# Patient Record
Sex: Female | Born: 1975 | Race: White | Hispanic: No | Marital: Married | Smoking: Never smoker
Health system: Southern US, Community
[De-identification: ages and names within clinical notes are randomized; demographics above are authoritative.]

## PROBLEM LIST (undated history)

## (undated) ENCOUNTER — Inpatient Hospital Stay (HOSPITAL_COMMUNITY): Payer: Self-pay

## (undated) DIAGNOSIS — F32A Depression, unspecified: Secondary | ICD-10-CM

## (undated) DIAGNOSIS — Z8619 Personal history of other infectious and parasitic diseases: Secondary | ICD-10-CM

## (undated) DIAGNOSIS — Z789 Other specified health status: Secondary | ICD-10-CM

## (undated) DIAGNOSIS — F329 Major depressive disorder, single episode, unspecified: Secondary | ICD-10-CM

## (undated) DIAGNOSIS — I10 Essential (primary) hypertension: Secondary | ICD-10-CM

## (undated) DIAGNOSIS — T7840XA Allergy, unspecified, initial encounter: Secondary | ICD-10-CM

## (undated) DIAGNOSIS — IMO0002 Reserved for concepts with insufficient information to code with codable children: Secondary | ICD-10-CM

## (undated) DIAGNOSIS — C50919 Malignant neoplasm of unspecified site of unspecified female breast: Secondary | ICD-10-CM

## (undated) DIAGNOSIS — C801 Malignant (primary) neoplasm, unspecified: Secondary | ICD-10-CM

## (undated) HISTORY — PX: TUBAL LIGATION: SHX77

## (undated) HISTORY — DX: Allergy, unspecified, initial encounter: T78.40XA

## (undated) HISTORY — DX: Personal history of other infectious and parasitic diseases: Z86.19

## (undated) HISTORY — PX: FRACTURE SURGERY: SHX138

## (undated) HISTORY — PX: WISDOM TOOTH EXTRACTION: SHX21

## (undated) HISTORY — PX: COSMETIC SURGERY: SHX468

## (undated) HISTORY — PX: HERNIA REPAIR: SHX51

## (undated) HISTORY — PX: FOOT SURGERY: SHX648

## (undated) HISTORY — DX: Reserved for concepts with insufficient information to code with codable children: IMO0002

## (undated) HISTORY — DX: Malignant neoplasm of unspecified site of unspecified female breast: C50.919

---

## 2000-09-13 DIAGNOSIS — R87619 Unspecified abnormal cytological findings in specimens from cervix uteri: Secondary | ICD-10-CM

## 2000-09-13 DIAGNOSIS — IMO0002 Reserved for concepts with insufficient information to code with codable children: Secondary | ICD-10-CM

## 2000-09-13 HISTORY — DX: Unspecified abnormal cytological findings in specimens from cervix uteri: R87.619

## 2000-09-13 HISTORY — DX: Reserved for concepts with insufficient information to code with codable children: IMO0002

## 2011-04-12 LAB — HEPATITIS B SURFACE ANTIGEN: Hepatitis B Surface Ag: NEGATIVE

## 2011-04-12 LAB — RPR: RPR: NONREACTIVE

## 2011-04-12 LAB — RUBELLA ANTIBODY, IGM: Rubella: IMMUNE

## 2011-04-12 LAB — ANTIBODY SCREEN: Antibody Screen: NEGATIVE

## 2011-09-06 ENCOUNTER — Inpatient Hospital Stay (HOSPITAL_COMMUNITY): Admission: AD | Admit: 2011-09-06 | Payer: Self-pay | Source: Ambulatory Visit | Admitting: Obstetrics and Gynecology

## 2011-09-14 DIAGNOSIS — I10 Essential (primary) hypertension: Secondary | ICD-10-CM

## 2011-09-14 HISTORY — DX: Essential (primary) hypertension: I10

## 2011-10-06 ENCOUNTER — Encounter (HOSPITAL_COMMUNITY): Payer: Self-pay

## 2011-10-06 ENCOUNTER — Inpatient Hospital Stay (HOSPITAL_COMMUNITY)
Admission: AD | Admit: 2011-10-06 | Discharge: 2011-10-06 | Disposition: A | Discharge status: Home / Self Care | Payer: PRIVATE HEALTH INSURANCE | Source: Ambulatory Visit | Attending: Obstetrics and Gynecology | Admitting: Obstetrics and Gynecology

## 2011-10-06 DIAGNOSIS — O368190 Decreased fetal movements, unspecified trimester, not applicable or unspecified: Secondary | ICD-10-CM | POA: Insufficient documentation

## 2011-10-06 DIAGNOSIS — O4700 False labor before 37 completed weeks of gestation, unspecified trimester: Secondary | ICD-10-CM | POA: Insufficient documentation

## 2011-10-06 DIAGNOSIS — O479 False labor, unspecified: Secondary | ICD-10-CM

## 2011-10-06 HISTORY — DX: Other specified health status: Z78.9

## 2011-10-06 LAB — URINALYSIS, ROUTINE W REFLEX MICROSCOPIC
Glucose, UA: NEGATIVE mg/dL
Hgb urine dipstick: NEGATIVE
Ketones, ur: NEGATIVE mg/dL
Protein, ur: NEGATIVE mg/dL

## 2011-10-06 NOTE — Progress Notes (Signed)
Pt states sent from office for ptl eval, had decreased fm, found to be ctxing. Denies bleeding or lof. +FM. Denies pain at present.

## 2011-10-06 NOTE — Progress Notes (Signed)
Patient states she went into the office today for decreased fetal movement. When placed on a monitor she was having contractions and sent to MAU for further monitoring.

## 2011-10-06 NOTE — ED Provider Notes (Signed)
History   Cheyna Retana is a 36y.o. MWF who presents at 35.3 weeks from office for extended ext fetal monitoring.  Pt presented to office secondary to decreased fetal movement recent, and also reporting pain at top of her stomach last night.  Seen by FNP at office and undeterminable fetal baseline on NST (i.e. Unsure if reactive vs variables); pt also had "9" ctxs while on NST at office.  Cx was L/closed at office exam by FNP.  Accompanied by her husband to MAU.  Reports adequate water intake recently.  Did walk and have increased activity last few days.  Denies LOF, VB, abnl d/c, UTI or PIH s/s.  No resp or GI c/o's.  Still working as Pensions consultant.  Followed by CNM service at [facility].   Pregnancy r/f:  1.  H/o eating disorder in her 35's  2.  H/o long cycles  3.  H/o PAC's/irreg heartbeat 4.  H/o freq UTI's in the past  4.  H/o EAB in 2006  Chief Complaint  Patient presents with  . Contractions   HPI  OB History    Grav Para Term Preterm Abortions TAB SAB Ect Mult Living   1               Past Medical History  Diagnosis Date  . No pertinent past medical history     Past Surgical History  Procedure Date  . Foot surgery     Family History  Problem Relation Age of Onset  . Anesthesia problems Neg Hx     History  Substance Use Topics  . Smoking status: Never Smoker   . Smokeless tobacco: Never Used  . Alcohol Use: No    Allergies: No Known Allergies  Prescriptions prior to admission  Medication Sig Dispense Refill  . Docosahexaenoic Acid (DHA OMEGA 3 PO) Take 1 tablet by mouth.      . Prenatal Vit-Fe Fumarate-FA (PRENATAL MULTIVITAMIN) TABS Take 1 tablet by mouth daily.        ROS--see history above Physical Exam   Blood pressure 132/77, pulse 110, temperature 97.6 F (36.4 C), temperature source Oral, resp. rate 20, SpO2 99.00%. EFM:  135, reactive, moderate variability, no decels TOCO:  irreg UC's q 2-7, spaced more as time passed on monitor--but did not complete  resolve .Marland Kitchen Results for orders placed during the hospital encounter of 10/06/11 (from the past 24 hour(s))  URINALYSIS, ROUTINE W REFLEX MICROSCOPIC     Status: Abnormal   Collection Time   10/06/11  5:35 PM      Component Value Range   Color, Urine YELLOW  YELLOW    APPearance CLEAR  CLEAR    Specific Gravity, Urine <1.005 (*) 1.005 - 1.030    pH 6.5  5.0 - 8.0    Glucose, UA NEGATIVE  NEGATIVE (mg/dL)   Hgb urine dipstick NEGATIVE  NEGATIVE    Bilirubin Urine NEGATIVE  NEGATIVE    Ketones, ur NEGATIVE  NEGATIVE (mg/dL)   Protein, ur NEGATIVE  NEGATIVE (mg/dL)   Urobilinogen, UA 0.2  0.0 - 1.0 (mg/dL)   Nitrite NEGATIVE  NEGATIVE    Leukocytes, UA NEGATIVE  NEGATIVE    Physical Exam  Constitutional: She is oriented to person, place, and time. She appears well-developed and well-nourished. No distress.       Anxious  Cardiovascular: Normal rate and regular rhythm.   Respiratory: Effort normal.  GI: Soft.       gravid  Neurological: She is alert and oriented to  person, place, and time.  Skin: Skin is warm and dry.    MAU Course  Procedures 1.  Extended ext monitoring  Assessment and Plan  1.  IUP at 35.3 2.  Preterm contractions but cx closed 3.  Reactive FHT 4.  H/o recent decreased FM; change from previous amt pt used to feeling  1.  D/c home w/ PTL precautions and FKC 2.  Has appt 1 week from tomorrow at [facility]; offered u/s before then for h/o decreased FM to eval afi, growth, bpp, and pt declined for this week, but ok to add to next week; also ok to move date up from Thurs--left voicemail at office to help schedule u/s w/ next ROB 3.  Declined Procardia and/or other meds for therapeutic rest, but may try Tylenol PM at night 4.  F/u prn 5.  Rec'd decrease exercise and pelvic rest until after 37 weeks  Joash Tony H 10/06/2011, 8:36 PM

## 2011-10-13 LAB — STREP B DNA PROBE: GBS: NEGATIVE

## 2011-11-12 ENCOUNTER — Ambulatory Visit (INDEPENDENT_AMBULATORY_CARE_PROVIDER_SITE_OTHER): Payer: PRIVATE HEALTH INSURANCE | Admitting: Obstetrics and Gynecology

## 2011-11-12 ENCOUNTER — Encounter (HOSPITAL_COMMUNITY): Payer: Self-pay | Admitting: *Deleted

## 2011-11-12 ENCOUNTER — Inpatient Hospital Stay (HOSPITAL_COMMUNITY)
Admission: AD | Admit: 2011-11-12 | Discharge: 2011-11-17 | DRG: 765 | Disposition: A | Discharge status: Home / Self Care | Payer: PRIVATE HEALTH INSURANCE | Source: Ambulatory Visit | Attending: Obstetrics and Gynecology | Admitting: Obstetrics and Gynecology

## 2011-11-12 ENCOUNTER — Other Ambulatory Visit: Payer: PRIVATE HEALTH INSURANCE

## 2011-11-12 ENCOUNTER — Encounter: Payer: PRIVATE HEALTH INSURANCE | Admitting: Obstetrics and Gynecology

## 2011-11-12 ENCOUNTER — Inpatient Hospital Stay (HOSPITAL_COMMUNITY): Payer: PRIVATE HEALTH INSURANCE

## 2011-11-12 DIAGNOSIS — Z331 Pregnant state, incidental: Secondary | ICD-10-CM

## 2011-11-12 DIAGNOSIS — N6320 Unspecified lump in the left breast, unspecified quadrant: Secondary | ICD-10-CM

## 2011-11-12 DIAGNOSIS — Z3400 Encounter for supervision of normal first pregnancy, unspecified trimester: Secondary | ICD-10-CM

## 2011-11-12 DIAGNOSIS — O3660X0 Maternal care for excessive fetal growth, unspecified trimester, not applicable or unspecified: Secondary | ICD-10-CM | POA: Diagnosis present

## 2011-11-12 DIAGNOSIS — D649 Anemia, unspecified: Secondary | ICD-10-CM | POA: Diagnosis not present

## 2011-11-12 DIAGNOSIS — K649 Unspecified hemorrhoids: Secondary | ICD-10-CM | POA: Diagnosis present

## 2011-11-12 DIAGNOSIS — O4290 Premature rupture of membranes, unspecified as to length of time between rupture and onset of labor, unspecified weeks of gestation: Secondary | ICD-10-CM | POA: Diagnosis present

## 2011-11-12 DIAGNOSIS — O09529 Supervision of elderly multigravida, unspecified trimester: Secondary | ICD-10-CM | POA: Diagnosis present

## 2011-11-12 DIAGNOSIS — O368190 Decreased fetal movements, unspecified trimester, not applicable or unspecified: Secondary | ICD-10-CM

## 2011-11-12 DIAGNOSIS — O324XX0 Maternal care for high head at term, not applicable or unspecified: Secondary | ICD-10-CM | POA: Diagnosis present

## 2011-11-12 DIAGNOSIS — O480 Post-term pregnancy: Secondary | ICD-10-CM | POA: Diagnosis present

## 2011-11-12 DIAGNOSIS — Z8659 Personal history of other mental and behavioral disorders: Secondary | ICD-10-CM

## 2011-11-12 DIAGNOSIS — O9903 Anemia complicating the puerperium: Secondary | ICD-10-CM | POA: Diagnosis not present

## 2011-11-12 DIAGNOSIS — O878 Other venous complications in the puerperium: Secondary | ICD-10-CM | POA: Diagnosis present

## 2011-11-12 DIAGNOSIS — N63 Unspecified lump in unspecified breast: Secondary | ICD-10-CM

## 2011-11-12 DIAGNOSIS — Z8679 Personal history of other diseases of the circulatory system: Secondary | ICD-10-CM

## 2011-11-12 DIAGNOSIS — Z8742 Personal history of other diseases of the female genital tract: Secondary | ICD-10-CM

## 2011-11-12 HISTORY — DX: Major depressive disorder, single episode, unspecified: F32.9

## 2011-11-12 HISTORY — DX: Essential (primary) hypertension: I10

## 2011-11-12 HISTORY — DX: Depression, unspecified: F32.A

## 2011-11-12 LAB — CBC
Hemoglobin: 12.2 g/dL (ref 12.0–15.0)
MCHC: 34.1 g/dL (ref 30.0–36.0)
Platelets: 203 10*3/uL (ref 150–400)
RBC: 3.75 MIL/uL — ABNORMAL LOW (ref 3.87–5.11)

## 2011-11-12 LAB — URINALYSIS, ROUTINE W REFLEX MICROSCOPIC
Bilirubin Urine: NEGATIVE
Nitrite: NEGATIVE
Specific Gravity, Urine: <1.005 — ABNORMAL LOW (ref 1.005–1.030)
pH: 6.5 (ref 5.0–8.0)

## 2011-11-12 LAB — AMNISURE RUPTURE OF MEMBRANE (ROM) NOT AT ARMC: Amnisure ROM: NEGATIVE

## 2011-11-12 LAB — WET PREP, GENITAL
Clue Cells Wet Prep HPF POC: NONE SEEN
Trich, Wet Prep: NONE SEEN
Yeast Wet Prep HPF POC: NONE SEEN

## 2011-11-12 LAB — GC/CHLAMYDIA PROBE AMP, GENITAL: Gonorrhea: NEGATIVE

## 2011-11-12 IMAGING — US US FETAL BPP W/O NONSTRESS
1 series · 13 of 21 positions shown · non-contrast
Comparison: none

[Series 1: us fetal bpp w/o nonstress · non-contrast · 21 acquisitions, 13 frames shown]
[im 1/21]
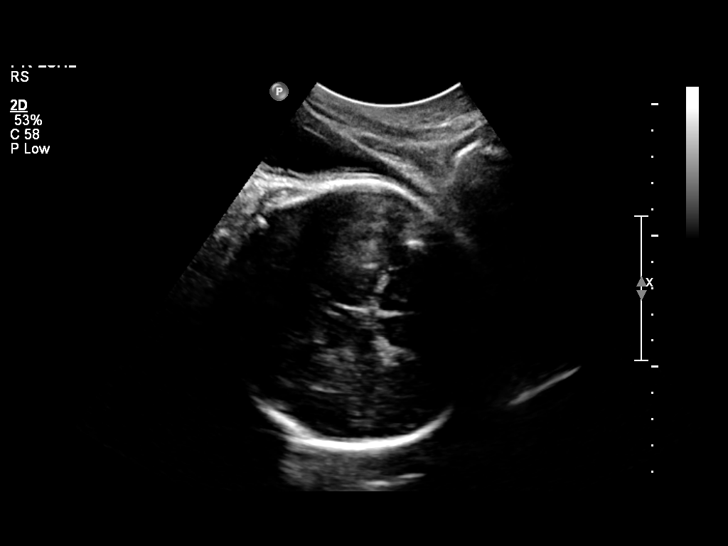
[im 3/21]
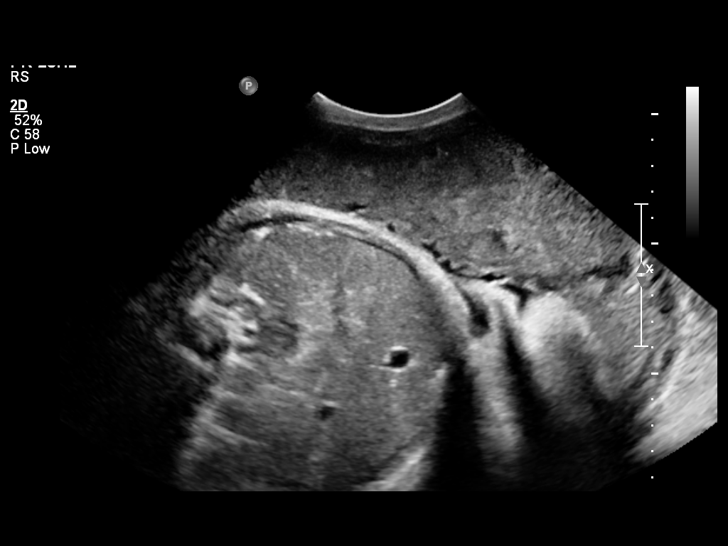
[im 5/21]
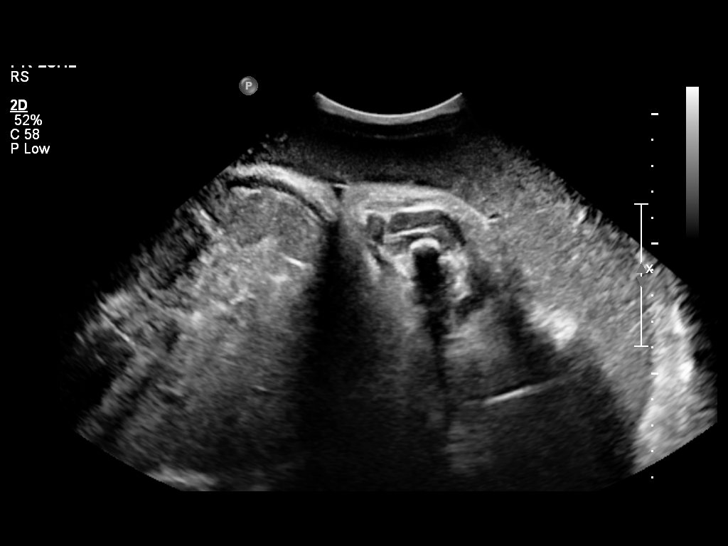
[im 6/21]
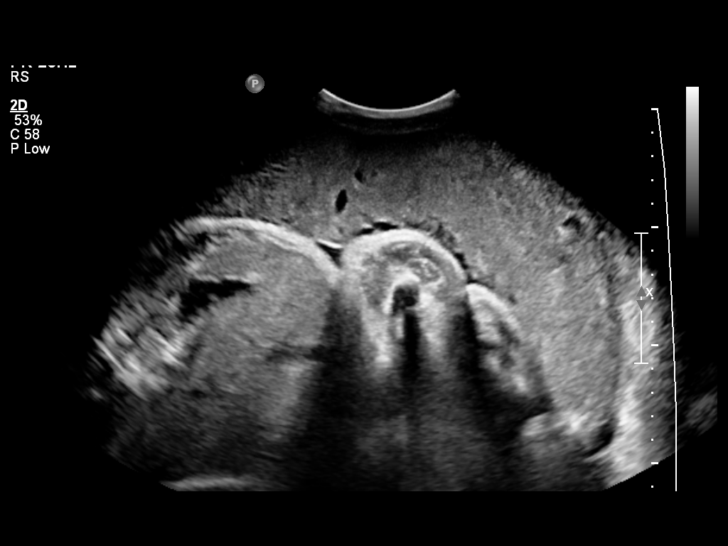
[im 8/21]
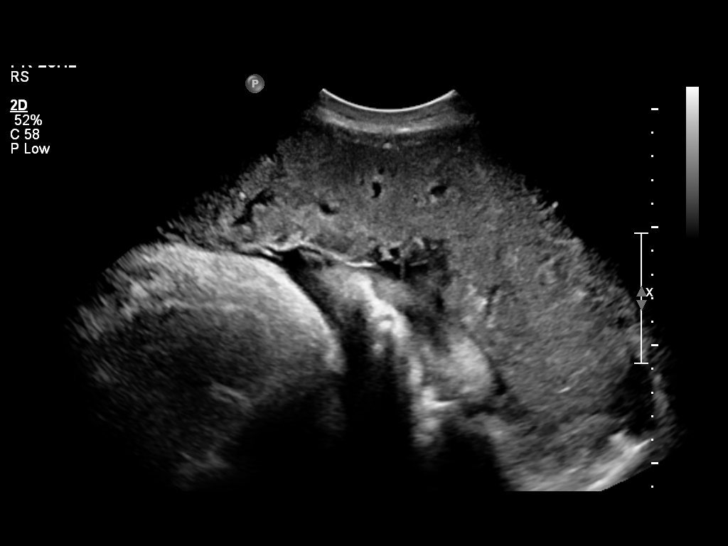
[im 9/21]
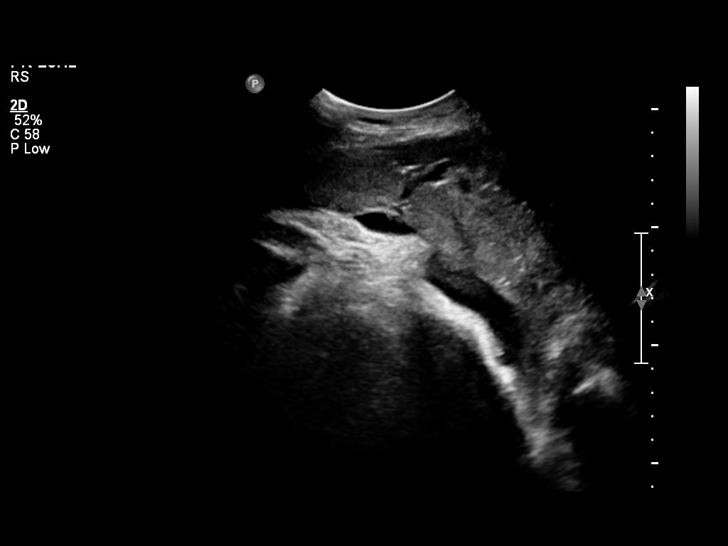
[im 11/21]
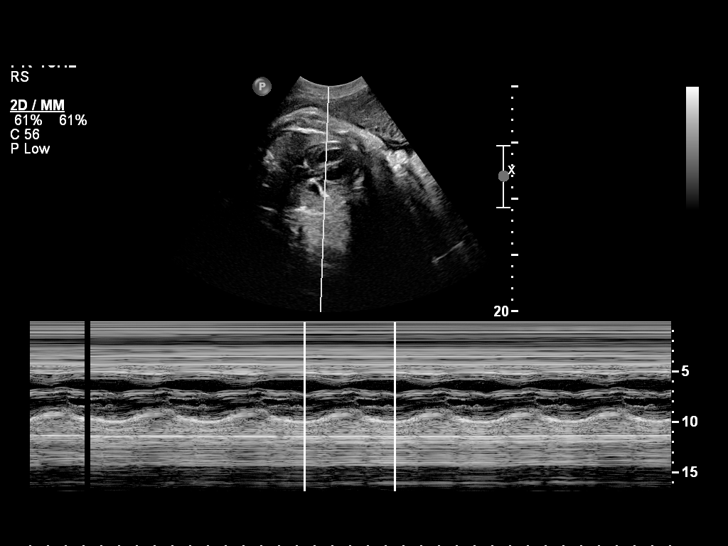
[im 13/21]
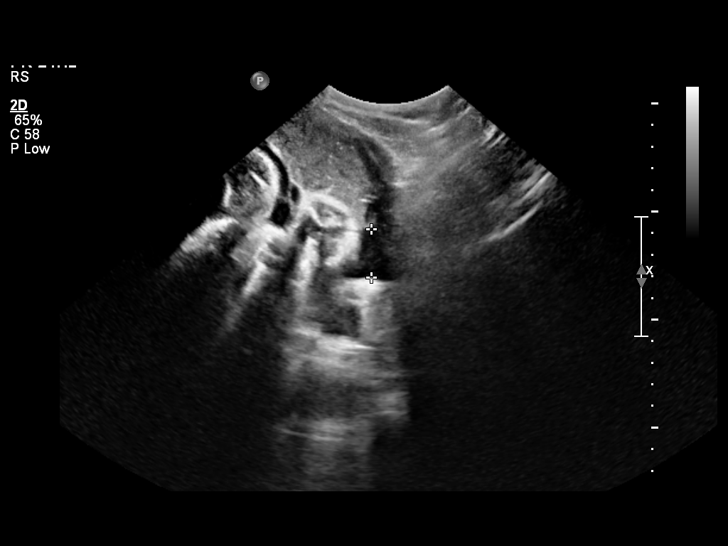
[im 14/21]
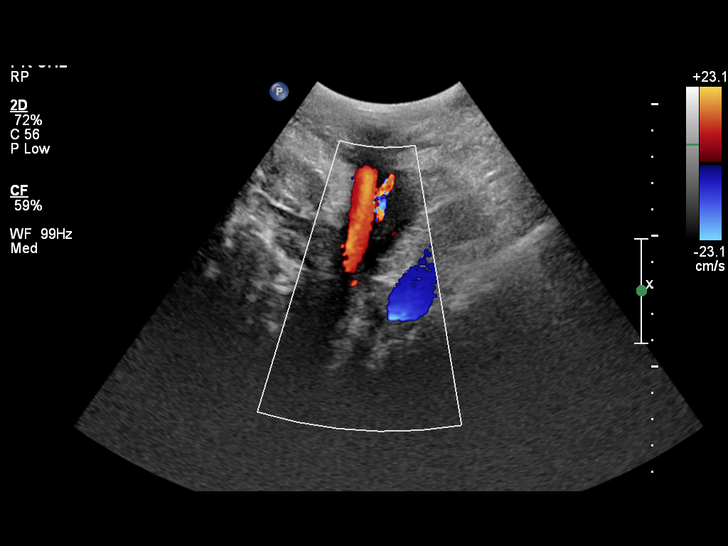
[im 16/21]
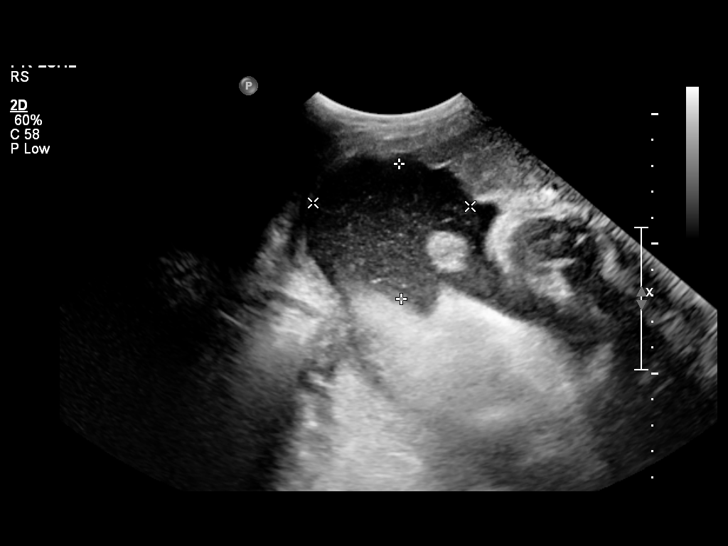
[im 17/21]
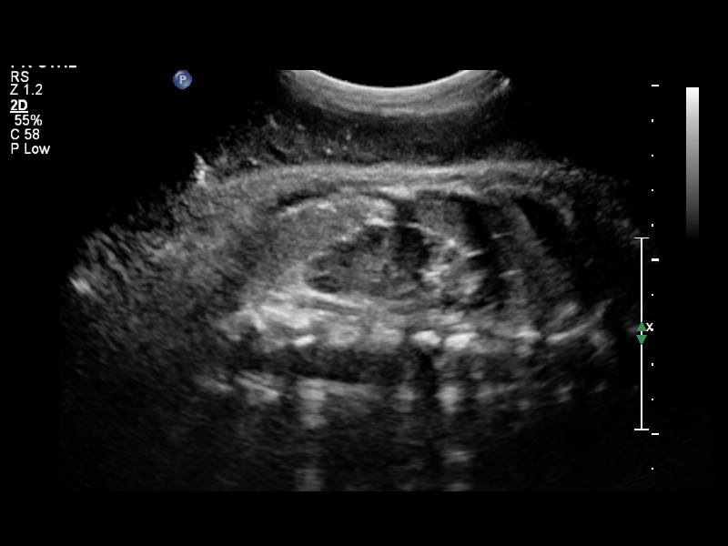
[im 19/21]
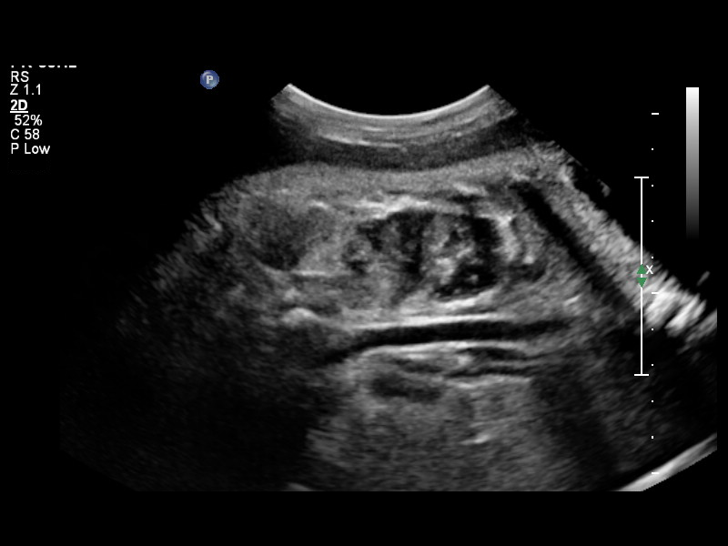
[im 21/21]
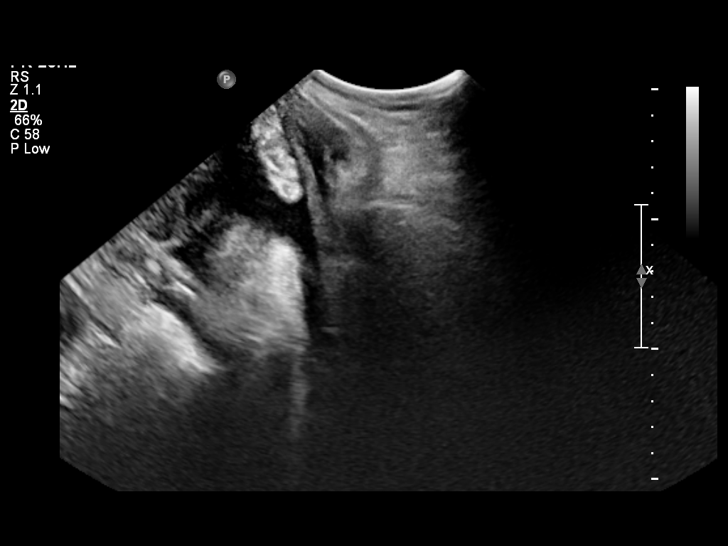

[13 of 21 positions shown; findings below may reference images not displayed]

OBSTETRICS REPORT
                      (Signed Final [DATE] [DATE])

                 78_E
Procedures

 [HOSPITAL]                                         [address]15.0
Indications

 Postdate pregnancy (40-42 weeks)
 Assess fetal well being
 Assess amniotic fluid volume
Fetal Evaluation

 Fetal Heart Rate:  129                         bpm
 Cardiac Activity:  Observed
 Presentation:      Cephalic
 Placenta:          Anterior, above cervical os

 Amniotic Fluid
 AFI FV:      Subjectively low-normal
 AFI Sum:     7.42    cm        9  %Tile     Larg Pckt:   5.19   cm
 RLQ:   5.19   cm    LUQ:    2.23   cm
Biophysical Evaluation

 Amniotic F.V:   Pocket => 2 cm two         F. Tone:        Observed
                 planes
 F. Movement:    Observed                   Score:          [DATE]
 F. Breathing:   Observed
Gestational Age

 Clinical EDD:  40w 5d                                        EDD:   [DATE]
 Best:          40w 5d    Det. By:   Clinical EDD             EDD:   [DATE]
Cervix Uterus Adnexa

 Cervix:       Not visualized (advanced GA >34 wks)
 Left Ovary:   Not visualized.
 Right Ovary:  Not visualized.
 Adnexa:     No abnormality visualized.
Impression

 BPP [DATE].

 Subjectively and quantiatively low normal amniotic fluid
 volume with a single pocket measuring > 2 x 2 cm and AFI at
 the 9%.

 questions or concerns.

## 2011-11-12 MED ORDER — IBUPROFEN 600 MG PO TABS: 600.0000 mg | ORAL_TABLET | Freq: Four times a day (QID) | ORAL | Status: DC | PRN

## 2011-11-12 MED ORDER — OXYCODONE-ACETAMINOPHEN 5-325 MG PO TABS: 1.0000 | ORAL_TABLET | ORAL | Status: DC | PRN

## 2011-11-12 MED ORDER — OXYTOCIN 20 UNITS IN LACTATED RINGERS INFUSION - SIMPLE: 125.0000 mL/h | Freq: Once | INTRAVENOUS | Status: DC

## 2011-11-12 MED ORDER — WITCH HAZEL-GLYCERIN EX PADS
MEDICATED_PAD | CUTANEOUS | Status: DC | PRN
Start: 2011-11-12 — End: 2011-11-17
  Administered 2011-11-16: 18:00:00 via TOPICAL

## 2011-11-12 MED ORDER — CITRIC ACID-SODIUM CITRATE 334-500 MG/5ML PO SOLN
30.0000 mL | ORAL | Status: DC | PRN
  Administered 2011-11-14: 30 mL via ORAL
  Filled 2011-11-12: qty 15

## 2011-11-12 MED ORDER — ACETAMINOPHEN 325 MG PO TABS: 650.0000 mg | ORAL_TABLET | ORAL | Status: DC | PRN

## 2011-11-12 MED ORDER — OXYTOCIN BOLUS FROM INFUSION
500.0000 mL | Freq: Once | INTRAVENOUS | Status: DC
  Filled 2011-11-12: qty 1000
  Filled 2011-11-12: qty 500

## 2011-11-12 MED ORDER — MISOPROSTOL 25 MCG QUARTER TABLET
25.0000 ug | ORAL_TABLET | ORAL | Status: DC | PRN
  Administered 2011-11-12 – 2011-11-13 (×3): 25 ug via ORAL
  Filled 2011-11-12 (×3): qty 0.25

## 2011-11-12 MED ORDER — WITCH HAZEL-GLYCERIN EX PADS: MEDICATED_PAD | CUTANEOUS | Status: DC | PRN

## 2011-11-12 MED ORDER — LACTATED RINGERS IV SOLN
INTRAVENOUS | Status: DC
  Administered 2011-11-13 – 2011-11-14 (×3): via INTRAVENOUS

## 2011-11-12 MED ORDER — ONDANSETRON HCL 4 MG/2ML IJ SOLN: 4.0000 mg | Freq: Four times a day (QID) | INTRAMUSCULAR | Status: DC | PRN

## 2011-11-12 MED ORDER — OXYTOCIN 10 UNIT/ML IJ SOLN: 10.0000 [IU] | Freq: Once | INTRAMUSCULAR | Status: DC

## 2011-11-12 MED ORDER — TERBUTALINE SULFATE 1 MG/ML IJ SOLN: 0.2500 mg | Freq: Once | INTRAMUSCULAR | Status: AC | PRN

## 2011-11-12 MED ORDER — SODIUM CHLORIDE 0.9 % IJ SOLN
3.0000 mL | Freq: Two times a day (BID) | INTRAMUSCULAR | Status: DC
  Administered 2011-11-13: 3 mL via INTRAVENOUS

## 2011-11-12 MED ORDER — DIBUCAINE 1 % RE OINT
TOPICAL_OINTMENT | RECTAL | Status: DC | PRN
  Filled 2011-11-12: qty 28

## 2011-11-12 MED ORDER — LIDOCAINE HCL (PF) 1 % IJ SOLN
30.0000 mL | INTRAMUSCULAR | Status: DC | PRN
  Filled 2011-11-12: qty 30

## 2011-11-12 MED ORDER — LACTATED RINGERS IV SOLN: 500.0000 mL | INTRAVENOUS | Status: DC | PRN

## 2011-11-12 NOTE — H&P (Signed)
Ebony Lamb is a 36 y.o. female presenting for evaluation of BP and NST after being seen at the office. NST initially non-reactive, BPP obtained. Initial amnisure was neg, but pt then had increased leaking of fluid, spec exam, pos pooling, neg fern, but amnisure was pos. BPP today was 8/8, with AFI of 9% (low normal). Pt denies reg ctx, no VB, GFM.  Pregnancy significant for:  1. Macrosomia 2. irreg cycles 3. Hist irreg HR PAC's? - negative cardiac workup per pt 4. AMA 5. Hx eating disorder  HPI: Pt initiated prenatal care at [facility] at 10wks, initial [name] at [facility] confirmed EDC of 2/24, based on LMP of 01/31/11. Pt had a routine anatomy scan at 18wks, that was normal. Prenatal course was otherwise uncomplicated. At 36wks [name] was obtained for S>D, EFW was 97% 8lb5oz, BPP was 8/8. At 38wks, EFW was 9lb, >90%, cervix was unfavorable at this time.  Maternal Medical History:  Reason for admission: Reason for admission: rupture of membranes.  Contractions: Frequency: irregular.   Not regular  Fetal activity: Perceived fetal activity is normal.   Last perceived fetal movement was within the past hour.    Prenatal complications: no prenatal complications   OB History    Grav Para Term Preterm Abortions TAB SAB Ect Mult Living   2    1 1          2006 - EAB  Past Medical History  Diagnosis Date  . No pertinent past medical history   . Depression   . Preterm labor   . Hypertension    Past Surgical History  Procedure Date  . Foot surgery   . Wisdom tooth extraction    Family History: family history includes Cancer in her father, maternal grandmother, and paternal grandmother; Depression in her father; Heart disease in her maternal grandfather; Hypertension in her brother and father; Kidney disease in her father; and Stroke in her paternal grandfather.  There is no history of Anesthesia problems. Social History:  reports that she has never smoked. She has never used smokeless tobacco. She reports  that she does not drink alcohol or use illicit drugs.  Pt is  MWF, works as an Pensions consultant.    Review of Systems  Genitourinary:       Questionable leaking fluid?  All other systems reviewed and are negative.    Dilation: Fingertip Effacement (%): 80 Exam by:: S. Callista Hoh, CNM Blood pressure 137/77, pulse 71, temperature 97.8 F (36.6 C), temperature source Oral, resp. rate 18, height 5\' 6"  (1.676 m), weight 74.39 kg (164 lb), SpO2 99.00%. Maternal Exam:  Uterine Assessment: Contraction strength is mild.  Contraction duration is 60 seconds. Contraction frequency is rare.   Abdomen: Patient reports no abdominal tenderness. Fundal height is LGA.   Estimated fetal weight is 9+.   Fetal presentation: vertex  Introitus: Normal vulva. Vagina is positive for vaginal discharge.  Ferning test: negative.  Nitrazine test: not done. Amniotic fluid character: clear. amnisure pos, pos pooling, copious amount milky watery d/c  Pelvis: questionable for delivery.   Cervix: Cervix evaluated by sterile speculum exam and digital exam.     Fetal Exam Fetal Monitor Review: Mode: ultrasound.   Baseline rate: 130.  Variability: moderate (6-25 bpm).   Pattern: accelerations present and no decelerations.    Fetal State Assessment: Category I - tracings are normal.     Physical Exam  Nursing note and vitals reviewed. Constitutional: She is oriented to person, place, and time. She appears well-developed and  well-nourished.  HENT:  Head: Normocephalic.  Neck: Normal range of motion.  Cardiovascular: Normal rate, regular rhythm and normal heart sounds.   Respiratory: Effort normal and breath sounds normal.  GI: Soft.  Genitourinary: Vaginal discharge found.  Musculoskeletal: Normal range of motion. She exhibits no edema.  Neurological: She is alert and oriented to person, place, and time. She has normal reflexes.  Skin: Skin is warm and dry.  Psychiatric: She has a normal mood and affect. Her  behavior is normal.    Prenatal labs: ABO, Rh: O/Positive/-- (07/30 1400) Antibody: Negative (07/30 1400) Rubella: Immune (07/30 1400) RPR: Nonreactive (07/30 1400)  HBsAg: Negative (07/30 1400)  HIV: Non-reactive (07/30 1400)  GBS: Negative (01/30 1400)  Pap/GC/CT - neg Declined genetic screens At 28wks - 1hr gtt =72, Hgb = 12.5, RPR = NR  Assessment/Plan: IUP at [redacted]w[redacted]d PROM Unfavorable cervix GBS neg Suspected macrosomia FHR reassuring  Admit to birthing suites per consult with Dr Stefano Gaul cytotec PO for ripening Routine admit orders Saline lock  Intermittent EFM Reg diet for now   Ebony Lamb M 11/12/2011, 4:30 PM

## 2011-11-12 NOTE — Progress Notes (Signed)
rtn appt, BP up today.  Over due, was having problems in office getting FH initially.  Denies HA, or visual changes, or epigastric pain. Slight increase in swelling in ankles and feet.

## 2011-11-12 NOTE — Progress Notes (Signed)
Patient ID: Ebony Lamb, female   DOB: 05-26-1976, 36 y.o.   MRN: [id number] .Subjective: Sitting up in bed, had dinner, feels occ ctx, leaking scant fluid, tearful worried cytotec won't help cervix   Objective: BP 131/81  Pulse 68  Temp(Src) 98 F (36.7 C) (Oral)  Resp 18  Ht 5\' 6"  (1.676 m)  Wt 74.39 kg (164 lb)  BMI 26.47 kg/m2  SpO2 99%   FHT:  FHR: 130 bpm, variability: moderate,  accelerations:  Present,  decelerations:  Absent UC:   irregular,  SVE:   Dilation: Fingertip Effacement (%): 80 Exam by:: S. Vale Mousseau, CNM    Assessment / Plan: IOL secondary to PROM GBS neg Plan repeat cytotec q4h overnight for ripening Discussed options with pt and recommendation to continue cervical ripening with PROM, pt and sig other agreeable   Fetal Wellbeing:  Category I Pain Control:  n/a  Update physician PRN  Malissa Hippo 11/12/2011, 8:36 PM

## 2011-11-12 NOTE — ED Provider Notes (Signed)
History     Chief Complaint  Patient presents with  . Hypertension   HPI Comments: Pt is a 36yo G1P0 at [redacted]w[redacted]d that was seen at the office with an elevated BP and difficulty tracing FHT's on NST which made patient nervous. She denies reg ctx, no VB, maybe had some leaking? Lost mucous plug, but feels like had increased d/c today. VE was done at office per pt, was 1cm. Also reports decreased FM today.  Pregnancy significant for:  1. Macrosomia 2. Hx eating disorder 3. irreg menses 4. Hx PAC's - cardiac workup normal  Hypertension      Past Medical History  Diagnosis Date  . No pertinent past medical history   . Depression   . Preterm labor   . Hypertension     Past Surgical History  Procedure Date  . Foot surgery     Family History  Problem Relation Age of Onset  . Anesthesia problems Neg Hx   . Cancer Father   . Hypertension Father   . Cancer Maternal Grandmother   . Heart disease Maternal Grandfather   . Cancer Paternal Grandmother   . Stroke Paternal Grandfather     History  Substance Use Topics  . Smoking status: Never Smoker   . Smokeless tobacco: Never Used  . Alcohol Use: No    Allergies: No Known Allergies  Prescriptions prior to admission  Medication Sig Dispense Refill  . Docosahexaenoic Acid (DHA OMEGA 3 PO) Take 1 tablet by mouth.      . Prenatal Vit-Fe Fumarate-FA (PRENATAL MULTIVITAMIN) TABS Take 1 tablet by mouth daily.        Review of Systems  Genitourinary: Positive for dysuria.       Increased vaginal d/c  All other systems reviewed and are negative.   Physical Exam   Blood pressure 114/65, pulse 75, temperature 98.7 F (37.1 C), temperature source Oral, resp. rate 18, height 5\' 6"  (1.676 m), weight 74.39 kg (164 lb), SpO2 99.00%.  Physical Exam  Nursing note and vitals reviewed. Constitutional: She is oriented to person, place, and time. She appears well-developed and well-nourished. She appears distressed.       Somewhat  anxious, feels better after hearing FHT's now  HENT:  Head: Normocephalic.  Neck: Normal range of motion.  Cardiovascular: Normal rate.   Respiratory: Effort normal.  GI: Soft. There is no tenderness.       Gravid soft  Genitourinary: Vaginal discharge found.       amnisure neg  Musculoskeletal: Normal range of motion.  Neurological: She is alert and oriented to person, place, and time.  Skin: Skin is warm and dry.  Psychiatric: She has a normal mood and affect. Her behavior is normal.   FHR 130 mod variability, 10x10 accels, 1 15x15 toco rare  MAU Course  Procedures  NST Serial BP's BPP UA  Assessment and Plan  IUP at [redacted]w[redacted]d Macrosomia NST non-reactive Awaiting BPP Will send UA D/W Dr Einar Pheasant 11/12/2011, 11:55 AM

## 2011-11-13 ENCOUNTER — Encounter (HOSPITAL_COMMUNITY): Payer: Self-pay | Admitting: Anesthesiology

## 2011-11-13 LAB — RPR: RPR Ser Ql: NONREACTIVE

## 2011-11-13 MED ORDER — SODIUM BICARBONATE 8.4 % IV SOLN
INTRAVENOUS | Status: DC | PRN
  Administered 2011-11-13: 8 mL via EPIDURAL

## 2011-11-13 MED ORDER — LIDOCAINE HCL (PF) 1 % IJ SOLN
INTRAMUSCULAR | Status: DC | PRN
  Administered 2011-11-13 (×2): 4 mL

## 2011-11-13 MED ORDER — EPHEDRINE 5 MG/ML INJ: 10.0000 mg | INTRAVENOUS | Status: DC | PRN

## 2011-11-13 MED ORDER — FENTANYL 2.5 MCG/ML BUPIVACAINE 1/10 % EPIDURAL INFUSION (WH - ANES)
14.0000 mL/h | INTRAMUSCULAR | Status: DC
  Administered 2011-11-13 – 2011-11-14 (×5): 14 mL/h via EPIDURAL
  Filled 2011-11-13 (×6): qty 60

## 2011-11-13 MED ORDER — PHENYLEPHRINE 40 MCG/ML (10ML) SYRINGE FOR IV PUSH (FOR BLOOD PRESSURE SUPPORT): 80.0000 ug | PREFILLED_SYRINGE | INTRAVENOUS | Status: DC | PRN

## 2011-11-13 MED ORDER — DIPHENHYDRAMINE HCL 50 MG/ML IJ SOLN: 12.5000 mg | INTRAMUSCULAR | Status: DC | PRN

## 2011-11-13 MED ORDER — PHENYLEPHRINE 40 MCG/ML (10ML) SYRINGE FOR IV PUSH (FOR BLOOD PRESSURE SUPPORT)
80.0000 ug | PREFILLED_SYRINGE | INTRAVENOUS | Status: DC | PRN
  Filled 2011-11-13: qty 5

## 2011-11-13 MED ORDER — FENTANYL 2.5 MCG/ML BUPIVACAINE 1/10 % EPIDURAL INFUSION (WH - ANES)
INTRAMUSCULAR | Status: DC | PRN
  Administered 2011-11-13: 14 mL/h via EPIDURAL

## 2011-11-13 MED ORDER — EPHEDRINE 5 MG/ML INJ
10.0000 mg | INTRAVENOUS | Status: DC | PRN
  Filled 2011-11-13: qty 4

## 2011-11-13 MED ORDER — LACTATED RINGERS IV SOLN
500.0000 mL | Freq: Once | INTRAVENOUS | Status: AC
  Administered 2011-11-13: 17:00:00 via INTRAVENOUS

## 2011-11-13 NOTE — Progress Notes (Signed)
Patient ID: Ebony Lamb, female   DOB: [birth year], 36 y.o.   MRN: [id number] .Subjective: Breathing with ctx now, sitting on side of bed, states they are much stronger and frequent   Objective: BP 108/76  Pulse 69  Temp(Src) 98 F (36.7 C) (Oral)  Resp 16  Ht 5\' 6"  (1.676 m)  Wt 74.39 kg (164 lb)  BMI 26.47 kg/m2  SpO2 99%   FHT:  FHR: 130 bpm, variability: moderate,  accelerations:  Abscent,  decelerations:  Absent UC:   regular, every 3-4 minutes SVE:   Dilation: Fingertip Effacement (%): 80 Exam by:: Ebony Lamb, CNM    Assessment / Plan: PROM GBS neg Will defer VE per pt request Plan intermittent EFM  Fetal Wellbeing:  Category I Pain Control:  Labor support without medications  Dr Stefano Gaul updated  Malissa Hippo 11/13/2011, 7:27 AM

## 2011-11-13 NOTE — Progress Notes (Signed)
Patient ID: Ebony Lamb, female   DOB: [birth year], 36 y.o.   MRN: [id number] .Subjective:  Pt has slept off and on,   Objective: BP 108/76  Pulse 69  Temp(Src) 98.1 F (36.7 C) (Oral)  Resp 16  Ht 5\' 6"  (1.676 m)  Wt 74.39 kg (164 lb)  BMI 26.47 kg/m2  SpO2 99%   FHT:  FHR: 120 bpm, variability: moderate,  accelerations:  Present,  decelerations:  Absent UC:   irregular, every 4-5 minutes, spaced out more now SVE:   Dilation: Fingertip Effacement (%): 80 Exam by:: S. Lorian Yaun, CNM  VE deferred  Assessment / Plan: IOL for PROM GBS neg  rv'd options with pt, ctx closer at 0030, then spaced out, cytotec repeated at 0230   Fetal Wellbeing:  Category I Pain Control:  n/a  Update physician PRN  Malissa Hippo 11/13/2011, 5:29 AM

## 2011-11-13 NOTE — Progress Notes (Signed)
Pt out of tub and into bed

## 2011-11-13 NOTE — Progress Notes (Signed)
Ebony Lamb is a 36 y.o. G2P0010 at [redacted]w[redacted]d admitted for rupture of membranes  Subjective: Reports contractions continue to intensify and become more frequent.  Pt breathing and relaxing well with UCs.  Requests SVE.    Objective: BP 131/78  Pulse 59  Temp(Src) 97.9 F (36.6 C) (Oral)  Resp 18  Ht 5\' 6"  (1.676 m)  Wt 74.39 kg (164 lb)  BMI 26.47 kg/m2  SpO2 98%     FHT:  FHR: 125 bpm, variability: moderate,  accelerations:  Present,  decelerations:  Absent UC:   regular, every 5-6 minutes, strong, 120-160 sec duration. SVE:   Dilation: 3 Effacement (%): 90 Station: -2 Exam by:: Elsie Ra, CNM  Labs: Lab Results  Component Value Date   WBC 7.1 11/12/2011   HGB 12.2 11/12/2011   HCT 35.8* 11/12/2011   MCV 95.5 11/12/2011   PLT 203 11/12/2011    Assessment / Plan: Early labor IUP at 41.0 wks  Labor: Early labor Preeclampsia:  no signs or symptoms of toxicity Fetal Wellbeing:  Category I Pain Control:  Labor support without medications I/D:  n/a Anticipated MOD:  NSVD  D/W pt use of pitocin for augmentation of labor.  She declines use of pitocin at present.  She desires to continue with non-interventive labor.    Sherine Cortese O. 11/13/2011, 2:26 PM

## 2011-11-13 NOTE — Anesthesia Procedure Notes (Addendum)
Epidural Patient location during procedure: OB Start time: 11/13/2011 4:57 PM  Staffing Anesthesiologist: FOSTER, MICHAEL A. Performed by: anesthesiologist   Preanesthetic Checklist Completed: patient identified, site marked, surgical consent, pre-op evaluation, timeout performed, IV checked, risks and benefits discussed and monitors and equipment checked  Epidural Patient position: sitting Prep: site prepped and draped and DuraPrep Patient monitoring: continuous pulse ox and blood pressure Approach: midline Injection technique: LOR air  Needle:  Needle type: Tuohy  Needle gauge: 17 G Needle length: 9 cm Needle insertion depth: 5 cm cm Catheter type: closed end flexible Catheter size: 19 Gauge Catheter at skin depth: 10 cm Test dose: negative and Other  Assessment Events: blood not aspirated, injection not painful, no injection resistance, negative IV test and no paresthesia  Additional Notes Patient identified. Risks and benefits discussed including failed block, incomplete  Pain control, post dural puncture headache, nerve damage, paralysis, blood pressure Changes, nausea, vomiting, reactions to medications-both toxic and allergic and post Partum back pain. All questions were answered. Patient expressed understanding and wished to proceed. Sterile technique was used throughout procedure. Epidural site was Dressed with sterile barrier dressing. No paresthesias, signs of intravascular injection Or signs of intrathecal spread were encountered.  Patient was more comfortable after the epidural was dosed. Please see RN's note for documentation of vital signs and FHR which are stable.    Spinal  Patient location during procedure: OR Start time: 11/14/2011 7:47 AM Staffing Anesthesiologist: CASSIDY, AMY L. Performed by: anesthesiologist  Preanesthetic Checklist Completed: patient identified, site marked, surgical consent, pre-op evaluation, timeout performed, IV checked,  risks and benefits discussed and monitors and equipment checked Spinal Block Patient position: sitting Prep: site prepped and draped and DuraPrep Patient monitoring: heart rate, cardiac monitor, continuous pulse ox and blood pressure Approach: midline Location: L3-4 Injection technique: single-shot Needle Needle type: Sprotte  Needle gauge: 24 G Needle length: 9 cm Assessment Sensory level: T4 Additional Notes Epidural catheter removed, tip intact.  Clear free flow CSF on first attempt.  No paresthesia.  Patient tolerated procedure well.  Jasmine December, MD

## 2011-11-13 NOTE — Anesthesia Preprocedure Evaluation (Signed)
Anesthesia Evaluation  Patient identified by MRN, date of birth, ID band Patient awake    Reviewed: Allergy & Precautions, H&P , Patient's Chart, lab work & pertinent test results  Airway Mallampati: II TM Distance: >3 FB Neck ROM: full    Dental No notable dental hx. (+) Teeth Intact   Pulmonary neg pulmonary ROS,  breath sounds clear to auscultation  Pulmonary exam normal       Cardiovascular hypertension, negative cardio ROS  Rhythm:regular Rate:Normal     Neuro/Psych PSYCHIATRIC DISORDERS Depression negative neurological ROS  negative psych ROS   GI/Hepatic negative GI ROS, Neg liver ROS,   Endo/Other  negative endocrine ROS  Renal/GU negative Renal ROS  negative genitourinary   Musculoskeletal   Abdominal Normal abdominal exam  (+)   Peds  Hematology negative hematology ROS (+)   Anesthesia Other Findings   Reproductive/Obstetrics (+) Pregnancy                           Anesthesia Physical Anesthesia Plan  ASA: II  Anesthesia Plan: Epidural   Post-op Pain Management:    Induction:   Airway Management Planned:   Additional Equipment:   Intra-op Plan:   Post-operative Plan:   Informed Consent: I have reviewed the patients History and Physical, chart, labs and discussed the procedure including the risks, benefits and alternatives for the proposed anesthesia with the patient or authorized representative who has indicated his/her understanding and acceptance.     Plan Discussed with: Anesthesiologist and Surgeon  Anesthesia Plan Comments:         Anesthesia Quick Evaluation

## 2011-11-13 NOTE — Progress Notes (Signed)
Fetus every active--difficult to trace-patient only comfortable sitting high fowlers--pt becoming agravated with monitoring--audible accelerations during contraction no decels heard

## 2011-11-13 NOTE — Progress Notes (Signed)
N. Smith,CNM in room, sve done due to pt feeling pressure.  Pt now anterior lip.  CNM offers to break bow, pt refuses at this time.  Pt also refuses pitocin at this time.

## 2011-11-13 NOTE — Progress Notes (Signed)
Ebony Lamb is a 36 y.o. G2P0010 at [redacted]w[redacted]d admitted for rupture of membranes  Subjective: Pt reports being able to sleep at intervals since epidural placement.  Has had issues with lower lt sided abd pain which is relieved with bolusing.  C/O increased vaginal pressure and intermittently experiences a brief urge to push.  No bldg and active fetus.  Husband remains present at the bedside and supportive.    Objective: BP 111/74  Pulse 94  Temp(Src) 98.5 F (36.9 C) (Oral)  Resp 17  Ht 5\' 6"  (1.676 m)  Wt 74.39 kg (164 lb)  BMI 26.47 kg/m2  SpO2 97%     FHT:  FHR: 155 bpm, variability: moderate,  accelerations:  Present,  decelerations:  Present Occas variable and rare late decel noted. UC:   regular, every 7-10 minutes, 120-190 secs in duration.   SVE:   Dilation: 9 Effacement (%): 100 Station: -2 Exam by:: Ebony Lamb, CNM  Labs: Lab Results  Component Value Date   WBC 7.1 11/12/2011   HGB 12.2 11/12/2011   HCT 35.8* 11/12/2011   MCV 95.5 11/12/2011   PLT 203 11/12/2011    Assessment / Plan: IUP at 41.0 wks PROM  Labor: Progressing slowly Preeclampsia:  no signs or symptoms of toxicity Fetal Wellbeing:  Category II Pain Control:  Epidural I/D:  n/a Anticipated MOD:  NSVD   Will continue to to monitor carefully.  Will defer pitocin at present due to progress.    Ebony Lamb O. 11/13/2011, 9:18 PM

## 2011-11-13 NOTE — Progress Notes (Signed)
Lillard CNM at bedside. Plan to monitor patient and UC's. Will hold cytotec at this time

## 2011-11-13 NOTE — Progress Notes (Signed)
Fundus is very anteverted.  Difficult to trace FHR lower on abd, due to anteverted fetal position, but able to trace from above the U.   Baby feels VTX by Leopold's. Notified primary RN of this.

## 2011-11-13 NOTE — Progress Notes (Signed)
Ebony Lamb is a 36 y.o. G2P0010 at [redacted]w[redacted]d admitted for rupture of membranes  Subjective: Reports UCs continue to intensify.  Still coping well but feels she is getting tired and is considering epidural placement.  No bldg and fetus is active.  Continues to leak small amts of clear fluid.    Objective: BP 131/78  Pulse 59  Temp(Src) 97.9 F (36.6 C) (Oral)  Resp 18  Ht 5\' 6"  (1.676 m)  Wt 74.39 kg (164 lb)  BMI 26.47 kg/m2  SpO2 98%     FHT:  FHR: 125 bpm, variability: moderate,  accelerations:  Present,  decelerations:  Absent UC:   regular, every 2-5 minutes, strong SVE:   Deferred at present   Labs: Lab Results  Component Value Date   WBC 7.1 11/12/2011   HGB 12.2 11/12/2011   HCT 35.8* 11/12/2011   MCV 95.5 11/12/2011   PLT 203 11/12/2011    Assessment / Plan: Early labor PROM IUP at 41.0 wks  Labor: Early labor Preeclampsia:  no signs or symptoms of toxicity Fetal Wellbeing:  Category I Pain Control:  Labor support without medications I/D:  n/a Anticipated MOD:  NSVD  RBA pain relief options during labor d/w pt including contd position changes, tub, IV meds and epidural placement.   Pt desires to proceed with epidural placement.    Austine Wiedeman O. 11/13/2011, 4:33 PM

## 2011-11-13 NOTE — Progress Notes (Signed)
Pt feels really hot, no temp per oral and axillary.  Air turned down and covers taken off.  Pt states she is sweaty

## 2011-11-13 NOTE — Progress Notes (Signed)
Ebony Lamb is a 36 y.o. G2P0010 at [redacted]w[redacted]d admitted for rupture of membranes  Subjective: Pt reports feeling some cramping and contractions. Reports the frequency and instensity have increased since approximately 5:30 AM.  She reports no bldg.  She continues to leak some clear amniotic fluid.  She reports her fetus is active.  She requests SVE at this time.  She has been out of bed moving about in room to help with discomfort.  Husb at bedside and supportive.    Objective: BP 130/79  Pulse 60  Temp(Src) 97.6 F (36.4 C) (Oral)  Resp 16  Ht 5\' 6"  (1.676 m)  Wt 74.39 kg (164 lb)  BMI 26.47 kg/m2  SpO2 99%  Pt breathing with UCs - unable to talk through contraction.  FHT:  FHR: 120 bpm, variability: moderate,  accelerations:  Present,  decelerations:  Absent UC:   regular, every 3-4 minutes, duration 60-160 secs and strong to palpation.   SVE:   Dilation: 1.5 Effacement (%): 90 Station: -2 Exam by:: N.Aralyn Nowak,CNM EFW approx 9.5# by [employer].  Brief bedside US done to verify presentation due to [facility] being obtained in pt's RUQ.    Labs: Lab Results  Component Value Date   WBC 7.1 11/12/2011   HGB 12.2 11/12/2011   HCT 35.8* 11/12/2011   MCV 95.5 11/12/2011   PLT 203 11/12/2011    Assessment / Plan: IUP at 41.0 wks PROM  Labor: Early labor Preeclampsia:  no signs or symptoms of toxicity Fetal Wellbeing:  Category I Pain Control:  Labor support without medications I/D:  n/a Anticipated MOD:  NSVD  Pt desires non-interventive labor if at all possible.  Will continue to monitor FHR intermittently and observe carefully.  Position changes encouraged.    Bailynn Dyk O. 11/13/2011, 10:04 AM

## 2011-11-13 NOTE — Progress Notes (Signed)
Sitting on toilet.  Husband states her last 2 UCs have lasted 3-3.85mins, c a frequency of 4-4.68mins.  - Monitored FHR c Telemetry U/S during 1 UC (sitting on toilet). FHR c appears to be 135 c mod variability.  Questionable decel during UC, but difficult to determine due to pt's position.  Will allow her to get back in bed & to monitor thru 1-2 more UCs

## 2011-11-13 NOTE — Progress Notes (Signed)
Pt in tub for relief from contractions--water temp checked and was WNL--will continue to assess

## 2011-11-14 ENCOUNTER — Encounter (HOSPITAL_COMMUNITY)
Admission: AD | Disposition: A | Discharge status: Home / Self Care | Payer: Self-pay | Source: Ambulatory Visit | Attending: Obstetrics and Gynecology

## 2011-11-14 ENCOUNTER — Encounter (HOSPITAL_COMMUNITY): Payer: Self-pay | Admitting: Anesthesiology

## 2011-11-14 ENCOUNTER — Inpatient Hospital Stay (HOSPITAL_COMMUNITY): Payer: PRIVATE HEALTH INSURANCE | Admitting: Anesthesiology

## 2011-11-14 ENCOUNTER — Encounter (HOSPITAL_COMMUNITY): Payer: Self-pay

## 2011-11-14 DIAGNOSIS — O3660X0 Maternal care for excessive fetal growth, unspecified trimester, not applicable or unspecified: Secondary | ICD-10-CM

## 2011-11-14 SURGERY — Surgical Case
Anesthesia: Regional | Site: Abdomen | Wound class: Clean Contaminated

## 2011-11-14 MED ORDER — OXYTOCIN 10 UNIT/ML IJ SOLN
INTRAMUSCULAR | Status: DC | PRN
  Administered 2011-11-14: 20 [IU]

## 2011-11-14 MED ORDER — MEDROXYPROGESTERONE ACETATE 150 MG/ML IM SUSP: 150.0000 mg | INTRAMUSCULAR | Status: DC | PRN

## 2011-11-14 MED ORDER — CEFAZOLIN SODIUM-DEXTROSE 2-3 GM-% IV SOLR: 2.0000 g | INTRAVENOUS | Status: DC

## 2011-11-14 MED ORDER — SODIUM CHLORIDE 0.9 % IJ SOLN: 3.0000 mL | INTRAMUSCULAR | Status: DC | PRN

## 2011-11-14 MED ORDER — MEPERIDINE HCL 25 MG/ML IJ SOLN: 6.2500 mg | INTRAMUSCULAR | Status: DC | PRN

## 2011-11-14 MED ORDER — SENNOSIDES-DOCUSATE SODIUM 8.6-50 MG PO TABS
2.0000 | ORAL_TABLET | Freq: Every day | ORAL | Status: DC
  Administered 2011-11-14 – 2011-11-16 (×3): 2 via ORAL

## 2011-11-14 MED ORDER — KETOROLAC TROMETHAMINE 30 MG/ML IJ SOLN
INTRAMUSCULAR | Status: AC
  Administered 2011-11-14: 30 mg via INTRAVENOUS
  Filled 2011-11-14: qty 1

## 2011-11-14 MED ORDER — SCOPOLAMINE 1 MG/3DAYS TD PT72
1.0000 | MEDICATED_PATCH | Freq: Once | TRANSDERMAL | Status: AC
  Administered 2011-11-14: 1.5 mg via TRANSDERMAL

## 2011-11-14 MED ORDER — BUPIVACAINE-EPINEPHRINE 0.5% -1:200000 IJ SOLN
INTRAMUSCULAR | Status: DC | PRN
  Administered 2011-11-14: 10 mL

## 2011-11-14 MED ORDER — LACTATED RINGERS IV SOLN: INTRAVENOUS | Status: DC

## 2011-11-14 MED ORDER — OXYTOCIN 20 UNITS IN LACTATED RINGERS INFUSION - SIMPLE
125.0000 mL/h | INTRAVENOUS | Status: AC
  Administered 2011-11-14: 125 mL/h via INTRAVENOUS

## 2011-11-14 MED ORDER — SODIUM CHLORIDE 0.9 % IR SOLN
Status: DC | PRN
  Administered 2011-11-14: 1000 mL

## 2011-11-14 MED ORDER — DIPHENHYDRAMINE HCL 25 MG PO CAPS: 25.0000 mg | ORAL_CAPSULE | Freq: Four times a day (QID) | ORAL | Status: DC | PRN

## 2011-11-14 MED ORDER — MENTHOL 3 MG MT LOZG: 1.0000 | LOZENGE | OROMUCOSAL | Status: DC | PRN

## 2011-11-14 MED ORDER — DIPHENHYDRAMINE HCL 25 MG PO CAPS: 25.0000 mg | ORAL_CAPSULE | ORAL | Status: DC | PRN

## 2011-11-14 MED ORDER — ONDANSETRON HCL 4 MG/2ML IJ SOLN: 4.0000 mg | Freq: Three times a day (TID) | INTRAMUSCULAR | Status: DC | PRN

## 2011-11-14 MED ORDER — SIMETHICONE 80 MG PO CHEW: 80.0000 mg | CHEWABLE_TABLET | ORAL | Status: DC | PRN

## 2011-11-14 MED ORDER — FENTANYL CITRATE 0.05 MG/ML IJ SOLN
INTRAMUSCULAR | Status: AC
  Filled 2011-11-14: qty 2

## 2011-11-14 MED ORDER — EPHEDRINE SULFATE 50 MG/ML IJ SOLN
INTRAMUSCULAR | Status: DC | PRN
  Administered 2011-11-14 (×3): 10 mg via INTRAVENOUS

## 2011-11-14 MED ORDER — IBUPROFEN 600 MG PO TABS
600.0000 mg | ORAL_TABLET | Freq: Four times a day (QID) | ORAL | Status: DC
  Administered 2011-11-14 – 2011-11-17 (×12): 600 mg via ORAL
  Filled 2011-11-14 (×11): qty 1

## 2011-11-14 MED ORDER — NALBUPHINE HCL 10 MG/ML IJ SOLN
5.0000 mg | INTRAMUSCULAR | Status: DC | PRN
  Filled 2011-11-14: qty 1

## 2011-11-14 MED ORDER — TETANUS-DIPHTH-ACELL PERTUSSIS 5-2.5-18.5 LF-MCG/0.5 IM SUSP
0.5000 mL | Freq: Once | INTRAMUSCULAR | Status: AC
  Administered 2011-11-15: 0.5 mL via INTRAMUSCULAR
  Filled 2011-11-14: qty 0.5

## 2011-11-14 MED ORDER — MORPHINE SULFATE (PF) 0.5 MG/ML IJ SOLN
INTRAMUSCULAR | Status: DC | PRN
  Administered 2011-11-14: .1 mg via INTRATHECAL

## 2011-11-14 MED ORDER — ONDANSETRON HCL 4 MG/2ML IJ SOLN
INTRAMUSCULAR | Status: DC | PRN
  Administered 2011-11-14: 4 mg via INTRAVENOUS

## 2011-11-14 MED ORDER — BUPIVACAINE-EPINEPHRINE (PF) 0.5% -1:200000 IJ SOLN
INTRAMUSCULAR | Status: AC
  Filled 2011-11-14: qty 10

## 2011-11-14 MED ORDER — SIMETHICONE 80 MG PO CHEW
80.0000 mg | CHEWABLE_TABLET | Freq: Three times a day (TID) | ORAL | Status: DC
  Administered 2011-11-14 – 2011-11-17 (×10): 80 mg via ORAL

## 2011-11-14 MED ORDER — EPHEDRINE 5 MG/ML INJ
INTRAVENOUS | Status: AC
  Filled 2011-11-14: qty 10

## 2011-11-14 MED ORDER — DIBUCAINE 1 % RE OINT: 1.0000 "application " | TOPICAL_OINTMENT | RECTAL | Status: DC | PRN

## 2011-11-14 MED ORDER — OXYTOCIN 20 UNITS IN LACTATED RINGERS INFUSION - SIMPLE
INTRAVENOUS | Status: AC
  Administered 2011-11-14: 125 mL/h via INTRAVENOUS
  Filled 2011-11-14: qty 1000

## 2011-11-14 MED ORDER — DIPHENHYDRAMINE HCL 50 MG/ML IJ SOLN: 25.0000 mg | INTRAMUSCULAR | Status: DC | PRN

## 2011-11-14 MED ORDER — BUPIVACAINE IN DEXTROSE 0.75-8.25 % IT SOLN
INTRATHECAL | Status: DC | PRN
  Administered 2011-11-14: 1.5 mL via INTRATHECAL

## 2011-11-14 MED ORDER — SODIUM CHLORIDE 0.9 % IV SOLN
1.0000 ug/kg/h | INTRAVENOUS | Status: DC | PRN
  Filled 2011-11-14: qty 2.5

## 2011-11-14 MED ORDER — METOCLOPRAMIDE HCL 5 MG/ML IJ SOLN: 10.0000 mg | Freq: Three times a day (TID) | INTRAMUSCULAR | Status: DC | PRN

## 2011-11-14 MED ORDER — WITCH HAZEL-GLYCERIN EX PADS: 1.0000 "application " | MEDICATED_PAD | CUTANEOUS | Status: DC | PRN

## 2011-11-14 MED ORDER — ONDANSETRON HCL 4 MG/2ML IJ SOLN
INTRAMUSCULAR | Status: AC
  Filled 2011-11-14: qty 2

## 2011-11-14 MED ORDER — MORPHINE SULFATE 0.5 MG/ML IJ SOLN
INTRAMUSCULAR | Status: AC
  Filled 2011-11-14: qty 10

## 2011-11-14 MED ORDER — OXYTOCIN 10 UNIT/ML IJ SOLN
INTRAMUSCULAR | Status: AC
  Filled 2011-11-14: qty 4

## 2011-11-14 MED ORDER — KETOROLAC TROMETHAMINE 30 MG/ML IJ SOLN: 30.0000 mg | Freq: Four times a day (QID) | INTRAMUSCULAR | Status: AC | PRN

## 2011-11-14 MED ORDER — KETOROLAC TROMETHAMINE 30 MG/ML IJ SOLN
30.0000 mg | Freq: Four times a day (QID) | INTRAMUSCULAR | Status: AC | PRN
  Administered 2011-11-14: 30 mg via INTRAVENOUS

## 2011-11-14 MED ORDER — ONDANSETRON HCL 4 MG/2ML IJ SOLN: 4.0000 mg | INTRAMUSCULAR | Status: DC | PRN

## 2011-11-14 MED ORDER — ONDANSETRON HCL 4 MG PO TABS: 4.0000 mg | ORAL_TABLET | ORAL | Status: DC | PRN

## 2011-11-14 MED ORDER — FENTANYL CITRATE 0.05 MG/ML IJ SOLN: 25.0000 ug | INTRAMUSCULAR | Status: DC | PRN

## 2011-11-14 MED ORDER — FENTANYL CITRATE 0.05 MG/ML IJ SOLN
INTRAMUSCULAR | Status: DC | PRN
  Administered 2011-11-14 (×2): 25 ug via INTRAVENOUS
  Administered 2011-11-14: 15 ug via INTRATHECAL
  Administered 2011-11-14: 25 ug via INTRAVENOUS
  Administered 2011-11-14: 10 ug via INTRAVENOUS

## 2011-11-14 MED ORDER — KETOROLAC TROMETHAMINE 60 MG/2ML IM SOLN
60.0000 mg | Freq: Once | INTRAMUSCULAR | Status: AC | PRN
  Filled 2011-11-14: qty 2

## 2011-11-14 MED ORDER — MEASLES, MUMPS & RUBELLA VAC ~~LOC~~ INJ
0.5000 mL | INJECTION | Freq: Once | SUBCUTANEOUS | Status: DC
  Filled 2011-11-14: qty 0.5

## 2011-11-14 MED ORDER — SCOPOLAMINE 1 MG/3DAYS TD PT72
MEDICATED_PATCH | TRANSDERMAL | Status: AC
  Administered 2011-11-14: 1.5 mg via TRANSDERMAL
  Filled 2011-11-14: qty 1

## 2011-11-14 MED ORDER — IBUPROFEN 600 MG PO TABS
600.0000 mg | ORAL_TABLET | Freq: Four times a day (QID) | ORAL | Status: DC | PRN
  Filled 2011-11-14: qty 1

## 2011-11-14 MED ORDER — PRENATAL MULTIVITAMIN CH
1.0000 | ORAL_TABLET | Freq: Every day | ORAL | Status: DC
  Administered 2011-11-15 – 2011-11-17 (×3): 1 via ORAL
  Filled 2011-11-14 (×3): qty 1

## 2011-11-14 MED ORDER — CEFAZOLIN SODIUM 1-5 GM-% IV SOLN
INTRAVENOUS | Status: AC
  Filled 2011-11-14: qty 100

## 2011-11-14 MED ORDER — CEFAZOLIN SODIUM 1-5 GM-% IV SOLN
INTRAVENOUS | Status: DC | PRN
  Administered 2011-11-14: 2 g via INTRAVENOUS

## 2011-11-14 MED ORDER — NALOXONE HCL 0.4 MG/ML IJ SOLN: 0.4000 mg | INTRAMUSCULAR | Status: DC | PRN

## 2011-11-14 MED ORDER — OXYCODONE-ACETAMINOPHEN 5-325 MG PO TABS
1.0000 | ORAL_TABLET | ORAL | Status: DC | PRN
  Administered 2011-11-15 (×2): 1 via ORAL
  Filled 2011-11-14 (×2): qty 1

## 2011-11-14 MED ORDER — ZOLPIDEM TARTRATE 5 MG PO TABS: 5.0000 mg | ORAL_TABLET | Freq: Every evening | ORAL | Status: DC | PRN

## 2011-11-14 MED ORDER — DIPHENHYDRAMINE HCL 50 MG/ML IJ SOLN: 12.5000 mg | INTRAMUSCULAR | Status: DC | PRN

## 2011-11-14 MED ORDER — LANOLIN HYDROUS EX OINT: 1.0000 "application " | TOPICAL_OINTMENT | CUTANEOUS | Status: DC | PRN

## 2011-11-14 SURGICAL SUPPLY — 39 items
CHLORAPREP W/TINT 26ML (MISCELLANEOUS) ×2 IMPLANT
CLOTH BEACON ORANGE TIMEOUT ST (SAFETY) ×2 IMPLANT
CONTAINER PREFILL 10% NBF 15ML (MISCELLANEOUS) IMPLANT
DRAIN JACKSON PRT FLT 7MM (DRAIN) IMPLANT
DRESSING TELFA 8X3 (GAUZE/BANDAGES/DRESSINGS) IMPLANT
DRSG COVADERM 4X10 (GAUZE/BANDAGES/DRESSINGS) ×2 IMPLANT
ELECT REM PT RETURN 9FT ADLT (ELECTROSURGICAL) ×2
ELECTRODE REM PT RTRN 9FT ADLT (ELECTROSURGICAL) ×1 IMPLANT
EVACUATOR SILICONE 100CC (DRAIN) IMPLANT
EXTRACTOR VACUUM M CUP 4 TUBE (SUCTIONS) IMPLANT
GAUZE SPONGE 4X4 12PLY STRL LF (GAUZE/BANDAGES/DRESSINGS) IMPLANT
GLOVE BIOGEL PI IND STRL 8.5 (GLOVE) ×1 IMPLANT
GLOVE BIOGEL PI INDICATOR 8.5 (GLOVE) ×1
GLOVE ECLIPSE 8.0 STRL XLNG CF (GLOVE) ×4 IMPLANT
GOWN PREVENTION PLUS LG XLONG (DISPOSABLE) ×2 IMPLANT
GOWN PREVENTION PLUS XXLARGE (GOWN DISPOSABLE) ×2 IMPLANT
KIT ABG SYR 3ML LUER SLIP (SYRINGE) IMPLANT
NEEDLE HYPO 25X1 1.5 SAFETY (NEEDLE) ×2 IMPLANT
NEEDLE HYPO 25X5/8 SAFETYGLIDE (NEEDLE) IMPLANT
PACK C SECTION WH (CUSTOM PROCEDURE TRAY) ×2 IMPLANT
PAD ABD 7.5X8 STRL (GAUZE/BANDAGES/DRESSINGS) ×2 IMPLANT
RINGERS IRRIG 1000ML POUR BTL (IV SOLUTION) ×2 IMPLANT
SLEEVE SCD COMPRESS KNEE MED (MISCELLANEOUS) ×2 IMPLANT
STAPLER VISISTAT 35W (STAPLE) IMPLANT
SUT MNCRL AB 3-0 PS2 27 (SUTURE) IMPLANT
SUT PLAIN 0 NONE (SUTURE) IMPLANT
SUT SILK 3 0 FS 1X18 (SUTURE) IMPLANT
SUT VIC AB 0 CT1 27 (SUTURE) ×2
SUT VIC AB 0 CT1 27XBRD ANBCTR (SUTURE) ×2 IMPLANT
SUT VIC AB 2-0 CTX 36 (SUTURE) ×4 IMPLANT
SUT VIC AB 3-0 CT1 27 (SUTURE)
SUT VIC AB 3-0 CT1 TAPERPNT 27 (SUTURE) IMPLANT
SUT VIC AB 3-0 SH 27 (SUTURE)
SUT VIC AB 3-0 SH 27X BRD (SUTURE) IMPLANT
SYR CONTROL 10ML LL (SYRINGE) ×2 IMPLANT
TAPE CLOTH SURG 4X10 WHT LF (GAUZE/BANDAGES/DRESSINGS) ×2 IMPLANT
TOWEL OR 17X24 6PK STRL BLUE (TOWEL DISPOSABLE) ×4 IMPLANT
TRAY FOLEY CATH 14FR (SET/KITS/TRAYS/PACK) IMPLANT
WATER STERILE IRR 1000ML POUR (IV SOLUTION) ×2 IMPLANT

## 2011-11-14 NOTE — Progress Notes (Signed)
Anesthesia called to come redose epidural per N. Smith,CNM.  Pt feeling pain and pressure, pushing.

## 2011-11-14 NOTE — Op Note (Signed)
OPERATIVE NOTE  Patient's Name: [name]  Date of Birth: 08/03/1976  Medical Records Number: [id number]  Date of Operation: 11/14/2011  Preoperative diagnosis:  [redacted]w[redacted]d weeks gestation  failure to descend  Postoperative diagnosis:  [redacted]w[redacted]d weeks gestation  failure to descend  Occiput posterior presentation  Macrosomia  Procedure:  Primary Low Transverse CESAREAN SECTION  Surgeon:  Leonard Schwartz, M.D.  Assistant:  Denny Levy, certified nurse midwife  Anesthesia:  Regional  Disposition:  Ebony Lamb is a 36 y.o. female, G2P0010, who presents at [redacted]w[redacted]d weeks gestation. The patient has been followed at the [facility] obstetrics and gynecology division of [facility] health care for women. She has the above mentioned diagnosis. She understands the indications for her procedure and she accepts the risk of, but not limited to, anesthetic complications, bleeding, infections, and possible damage to the surrounding organs.  Findings:  A 9 pound 6 ounce female (Unknown) was delivered from a OP position.  The Apgar scores were 9/9 . The uterus, fallopian tubes, and ovaries were normal for the gravid state.  Procedure:  The patient was taken to the operating room where a spinal anesthetic was given. The patient's abdomen was prepped with Chloraprep. The perineum was prepped with betadine. A Foley catheter was previously  placed in the bladder. The patient was sterilely draped. The lower abdomen was injected with half percent Marcaine with epinephrine. A low transverse incision was made in the abdomen and carried sharply through the subcutaneous tissue, the fascia, and the anterior peritoneum. An incision was made in the lower uterine segment. The incision was extended in a low transverse fashion. The membranes were ruptured. The fetal head was delivered without difficulty. The mouth and nose were suctioned. The remainder of the infant was then delivered. The cord was  clamped and cut. The infant was handed to the awaiting pediatric team.  Routine cord blood studies were obtained. The placenta was removed. The uterine cavity was cleaned of amniotic fluid, clotted blood, and membranes. The uterine incision was closed using a running locking suture of 2-0 Vicryl. An imbricating suture of 2-0 Vicryl was placed. The pelvis was vigorously irrigated. Hemostasis was adequate. The anterior peritoneum and the abdominal musculature were closed using 2-0 Vicryl. The fascia was closed using a running suture of 0 Vicryl followed by 3 interrupted sutures of 0 Vicryl. The subcutaneous layer was closed using interrupted sutures of 2-0 Vicryl. The skin was reapproximated using a subcuticular suture of 3-0 Monocryl. Sponge, needle, and instrument counts were correct on 2 occasions. The estimated blood loss for the procedure was 800 cc. The patient tolerated her procedure well. She was transported to the recovery room in stable condition. The infant was taken to the full-term nursery in stable condition. The placenta was sent to labor and delivery.  Leonard Schwartz, M.D.

## 2011-11-14 NOTE — Progress Notes (Signed)
Epidural not relieving pain despite multiple boluses by Anesthesia during the night.  Dana Allan, MD notified by Arvin Collard, RN.  Dr Franne Grip will replace with spinal in the OR.

## 2011-11-14 NOTE — Progress Notes (Signed)
Dr. Stefano Gaul in room with h. Steelman,CNM and n. Smith CNM.  Discussing c-section.  Pt agrees to having c-section for failure to descend

## 2011-11-14 NOTE — Progress Notes (Signed)
N. Smith,CNM in room, sve done, no change in station.  CNM discusses options with pt including c-section.

## 2011-11-14 NOTE — OR Nursing (Signed)
Uterus massaged by S. Maelynn Moroney RN. Two tubes of cord blood to lab. Foley catheter in place upon arrival to OR. Urine color -concentrated. 

## 2011-11-14 NOTE — Progress Notes (Signed)
Dr. Malen Gauze in room, pulls back epidural catheter and redoses pt.

## 2011-11-14 NOTE — Progress Notes (Signed)
Ebony Lamb is a 36 y.o. G2P0010 at [redacted]w[redacted]d admitted for rupture of membranes  Subjective: Pt's epidural dosed by anesthesia and catheter was also pull back slightly with relief of discomfort so that she has been able to rest.  Pt with slight urge to push at the present time.  Pt states she feels she can push more effectively if she is not having the left abdominal pain.  Objective: BP 125/80  Pulse 85  Temp(Src) 98.3 F (36.8 C) (Oral)  Resp 18  Ht 5\' 6"  (1.676 m)  Wt 74.39 kg (164 lb)  BMI 26.47 kg/m2  SpO2 97%     FHT:  FHR: 130 bpm, variability: moderate,  accelerations:  Present,  decelerations:  Present Occas variable and early decel noted. UC:   regular, every 3-4 mins SVE:   Dilation: 10 Effacement (%): 100 Station: 0 Exam by:: N. Kashawn Dirr,CNM No significant descent over the past 2 hrs while laboring down.  Labs: Lab Results  Component Value Date   WBC 7.1 11/12/2011   HGB 12.2 11/12/2011   HCT 35.8* 11/12/2011   MCV 95.5 11/12/2011   PLT 203 11/12/2011   Assessment / Plan: IUP at 41.0 wks PROM   Labor: 2nd stage of labor Preeclampsia:  no signs or symptoms of toxicity Fetal Wellbeing:  Category II Pain Control:  Epidural I/D:  n/a Anticipated MOD:  NSVD  Will begin pushing at present. Continue to observe carefully.   Ebony Lamb O. 11/14/2011, 3:24 AM

## 2011-11-14 NOTE — Progress Notes (Signed)
Ebony Lamb is a 36 y.o. G2P1011 at [redacted]w[redacted]d admitted for rupture of membranes  Subjective: C/O urge to push with UCs.  Continues to have lt sided lower abdominal pain which has been relieved with boluses.    Objective: Temperature: 98.3; blood pressure: 106/64; pulse 91; resp 20.  FHT:  FHR 140 bpm; variability moderate; accels present; decels absent.   UC:   regular, every 2-9 minutes; 60-180 sec duration.   SVE:   Dilation: Anterior Lip Effacement (%):  100 Station: -1 Bulging forebag noted.    Labs: Lab Results  Component Value Date   WBC 7.1 11/12/2011   HGB 12.2 11/12/2011   HCT 35.8* 11/12/2011   MCV 95.5 11/12/2011   PLT 203 11/12/2011   Assessment / Plan: IUP at 41.0 wks PROM Active labor  Labor: Active labor Preeclampsia:  no signs or symptoms of toxicity Fetal Wellbeing:  Category I Pain Control:  Epidural I/D:  n/a Anticipated MOD:  NSVD  Continue to observe and reevaluate cervix in 2 hrs or if pt has increased urge to push with UCs.   Ebony Lamb O. 11/14/2011, 8:10 PM

## 2011-11-14 NOTE — Anesthesia Postprocedure Evaluation (Signed)
Anesthesia Post Note  Patient: Ebony Lamb  Procedure(s) Performed: Procedure(s) (LRB): CESAREAN SECTION (N/A)  Anesthesia type: Epidural  Patient location: PACU  Post pain: Pain level controlled  Post assessment: Post-op Vital signs reviewed  Last Vitals:  Filed Vitals:   11/14/11 1015  BP: 124/74  Pulse: 62  Temp: 36.9 C  Resp: 16    Post vital signs: Reviewed  Level of consciousness: awake  Complications: No apparent anesthesia complications

## 2011-11-14 NOTE — Anesthesia Postprocedure Evaluation (Signed)
  Anesthesia Post-op Note  Patient: Ebony Lamb  Procedure(s) Performed: Procedure(s) (LRB): CESAREAN SECTION (N/A)  Patient Location: Mother/Baby  Anesthesia Type: Spinal  Level of Consciousness: awake, alert  and oriented  Airway and Oxygen Therapy: Patient Spontanous Breathing  Post-op Pain: mild  Post-op Assessment: Patient's Cardiovascular Status Stable, Respiratory Function Stable, Patent Airway, No signs of Nausea or vomiting and Pain level controlled  Post-op Vital Signs: stable  Complications: No apparent anesthesia complications

## 2011-11-14 NOTE — Progress Notes (Signed)
Ebony Lamb is a 36 y.o. G2P1011 at [redacted]w[redacted]d admitted for rupture of membranes  Subjective: Pt has been pushing with excellent effort over the past 3 hrs.  She has urge to push involuntarily with contractions.    Objective: BP 135/83; Pulse 90; Resp 20; Temp 98.0.   FHT:  FHR: 140 bpm, variability: moderate,  accelerations:  Present,  decelerations:  Present Occas non-repetitive variable   UC:   regular, every 2-5 minutes SVE:   Dilation: 10cm Effacement (%):  100% Station: 0  Labs: Lab Results  Component Value Date   WBC 7.1 11/12/2011   HGB 12.2 11/12/2011   HCT 35.8* 11/12/2011   MCV 95.5 11/12/2011   PLT 203 11/12/2011    Assessment / Plan: IUP at 41.0 wks PROM  Labor: 2nd stage Preeclampsia:  no signs or symptoms of toxicity Fetal Wellbeing:  Category II Pain Control:  Epidural I/D:  n/a Anticipated MOD:  NSVD  D/W pt lack of descent despite excellent maternal efforts.  D/W pt RBA C/S for delivery and Dr. Stefano Gaul to come evaluate pt for mode of delivery.    Ebony Lamb O. 11/14/2011, 8:35 PM

## 2011-11-14 NOTE — Progress Notes (Signed)
Dr Stefano Gaul & Elsie Ra, CNM at bedside.  Reviewed need for C/S & risks/benefits c pt & husband.  Both voiced understanding.

## 2011-11-14 NOTE — Transfer of Care (Signed)
Immediate Anesthesia Transfer of Care Note  Patient: Ebony Lamb  Procedure(s) Performed: Procedure(s) (LRB): CESAREAN SECTION (N/A)  Patient Location: PACU  Anesthesia Type: Spinal  Level of Consciousness: awake, alert  and oriented  Airway & Oxygen Therapy: Patient Spontanous Breathing  Post-op Assessment: Report given to PACU RN and Post -op Vital signs reviewed and stable  Post vital signs: Reviewed and stable  Complications: No apparent anesthesia complications

## 2011-11-14 NOTE — Addendum Note (Signed)
Addendum  created 11/14/11 1504 by Lincoln Brigham, CRNA   Modules edited:Notes Section

## 2011-11-14 NOTE — Progress Notes (Signed)
The fetal head has not descended in spite of pushing for greater than 3 hours. Management options were reviewed. Risk and benefits were discussed. The patient elects to proceed with cesarean delivery. She understands the indications for surgical procedure. She accepts the risk of, but not limited to, anesthetic complications, bleeding, infections, and possible damage to the surrounding organs.

## 2011-11-14 NOTE — Progress Notes (Signed)
Rec'd bedside report & assumed pt care. 

## 2011-11-14 NOTE — Progress Notes (Signed)
Ebony Lamb is a 36 y.o. G2P0010 at [redacted]w[redacted]d admitted for rupture of membranes  Subjective: Pt c/o consistent urge to push with UCs but again is having issues with lt lower abd pain.  No bldg.  Baby moving.    Objective: BP 140/90  Pulse 85  Temp(Src) 98.3 F (36.8 C) (Oral)  Resp 17  Ht 5\' 6"  (1.676 m)  Wt 74.39 kg (164 lb)  BMI 26.47 kg/m2  SpO2 97%     FHT:  FHR: 140 bpm, variability: moderate,  accelerations:  Present,  decelerations:  Present Occas variable decel with nadir to 110-120 bpm and occas early decel. noted. UC:   regular, every 3-4 minutes SVE:   Dilation: 10 Effacement (%): 100 Station: 0 Exam by:: Conni Elliot, CNM  Labs: Lab Results  Component Value Date   WBC 7.1 11/12/2011   HGB 12.2 11/12/2011   HCT 35.8* 11/12/2011   MCV 95.5 11/12/2011   PLT 203 11/12/2011   Assessment / Plan: IUP at 41.0 wks PROM  Labor: Beginning 2nd stage of labor Preeclampsia:  no signs or symptoms of toxicity Fetal Wellbeing:  Category II Pain Control:  Epidural I/D:  n/a Anticipated MOD:  NSVD  D/W pt fetal station.  Pt agrees with dosing epidural to achieve relief of discomfort and allow laboring down.  Will re-eval in 2hrs to assess progress unless pt is unable to get relief with epidural or pt has increased urge to push.    Ebony Lamb O. 11/14/2011, 1:04 AM

## 2011-11-15 ENCOUNTER — Encounter (HOSPITAL_COMMUNITY): Payer: Self-pay | Admitting: Obstetrics and Gynecology

## 2011-11-15 LAB — CBC
MCV: 93.8 fL (ref 78.0–100.0)
Platelets: 169 10*3/uL (ref 150–400)
RDW: 12.7 % (ref 11.5–15.5)
WBC: 15.3 10*3/uL — ABNORMAL HIGH (ref 4.0–10.5)

## 2011-11-15 NOTE — Progress Notes (Signed)
Subjective: Postpartum Day 1: Cesarean Delivery Patient reports tolerating PO, + flatus and no problems voiding. Reports newborn latching well.  Little sleep thus far, and for pt, little over the last few days.  Pain at incision when "moving."  Husband remains at bedside.    Objective: Vital signs in last 24 hours: Temp:  [97.7 F (36.5 C)-98.4 F (36.9 C)] 97.7 F (36.5 C) (03/04 0820) Pulse Rate:  [64-78] 66  (03/04 0820) Resp:  [16-18] 16  (03/04 0820) BP: (107-120)/(65-73) 118/73 mmHg (03/04 0820) SpO2:  [96 %-97 %] 97 % (03/04 0820)  Physical Exam:  General: alert, cooperative and no distress Lochia: appropriate, rubra Uterine Fundus: firm, below umbilicus Incision: Post-op dsg:  C/d/i DVT Evaluation: No evidence of DVT seen on physical exam. Negative Homan's sign. Calf/Ankle edema is present. RLE >LLE; 2-3+pitting.   Basename 11/15/11 0535 11/12/11 1540  HGB 9.8* 12.2  HCT 28.5* 35.8*    Assessment/Plan: Status post Cesarean section. Postoperative course complicated by anemia.  Will start Fe supplement.  [facility] consult today.  Increase ambulation today and shower if feels up to it.  Continue current care.  Talicia Sui H 11/15/2011, 1:13 PM

## 2011-11-16 MED ORDER — HYDROCORTISONE ACE-PRAMOXINE 1-1 % RE FOAM
1.0000 | Freq: Two times a day (BID) | RECTAL | Status: DC
  Administered 2011-11-16: 1 via RECTAL
  Filled 2011-11-16: qty 10

## 2011-11-16 NOTE — Progress Notes (Addendum)
Subjective: Postpartum Day 2: Cesarean Delivery due to FTD after 3 hours of pushing Patient reports no problems voiding.  Does have hemorrhoids, feeling some discomfort with that.  Passing gas, denies N/V.  Had small BM yesterday.   Objective: Vital signs in last 24 hours: Temp:  [98.2 F (36.8 C)-98.5 F (36.9 C)] 98.2 F (36.8 C) (03/05 0645) Pulse Rate:  [65-80] 65  (03/05 0645) Resp:  [18] 18  (03/05 0645) BP: (112-142)/(75-92) 113/77 mmHg (03/05 0645)  Physical Exam:  General: alert Lochia: appropriate Uterine Fundus: firm Incision: healing well DVT Evaluation: No evidence of DVT seen on physical exam.  Moderate edema of LE, but improving. Small hemorrhoid, non-thrombosed. Positive gaseous distension of upper abdomen, but good bowel sounds.  Basename 11/15/11 0535  HGB 9.8*  HCT 28.5*    Assessment/Plan: Status post Cesarean section. Doing well postoperatively.  Continue current care. Sitz bath and topical hemorrhoid cream. Encourage ambulation. Anticipate d/c tomorrow. Considering Micronor.  Nigel Bridgeman 11/16/2011, 9:32 AM   Agree with above

## 2011-11-17 MED ORDER — OXYCODONE-ACETAMINOPHEN 5-325 MG PO TABS: 1.0000 | ORAL_TABLET | ORAL | Status: AC | PRN

## 2011-11-17 MED ORDER — IBUPROFEN 600 MG PO TABS: 600.0000 mg | ORAL_TABLET | Freq: Four times a day (QID) | ORAL | Status: AC | PRN

## 2011-11-17 MED ORDER — PRENATAL MULTIVITAMIN CH: 1.0000 | ORAL_TABLET | Freq: Every day | ORAL | Status: DC

## 2011-11-17 MED ORDER — PRAMOXINE HCL 1 % RE FOAM: RECTAL | Status: AC | PRN

## 2011-11-17 MED ORDER — HYDROCORTISONE ACE-PRAMOXINE 1-1 % RE FOAM
1.0000 | Freq: Two times a day (BID) | RECTAL | Status: DC
  Administered 2011-11-17: 1 via RECTAL

## 2011-11-17 MED ORDER — FERROUS SULFATE 325 (65 FE) MG PO TABS: 325.0000 mg | ORAL_TABLET | Freq: Two times a day (BID) | ORAL | Status: DC

## 2011-11-17 NOTE — Discharge Instructions (Signed)
Cesarean Delivery  °Cesarean delivery is the birth of a baby through a cut (incision) in the abdomen and womb (uterus).  °LET YOUR CAREGIVER KNOW ABOUT: °· Complications involving the pregnancy.  °· Allergies.  °· Medicines taken including herbs, eyedrops, over-the-counter medicines, and creams.  °· Use of steroids (by mouth or creams).  °· Previous problems with anesthetics or numbing medicine.  °· Previous surgery.  °· History of blood clots.  °· History of bleeding or blood problems.  °· Other health problems.  °RISKS AND COMPLICATIONS  °· Bleeding.  °· Infection.  °· Blood clots.  °· Injury to surrounding organs.  °· Anesthesia problems.  °· Injury to the baby.  °BEFORE THE PROCEDURE  °· A tube (Foley catheter) will be placed in your bladder. The Foley catheter drains the urine from your bladder into a bag. This keeps your bladder empty during surgery.  °· An intravenous access tube (IV) will be placed in your arm.  °· Hair may be removed from your pubic area and your lower abdomen. This is to prevent infection in the incision site.  °· You may be given an antacid medicine to drink. This will prevent acid contents in your stomach from going into your lungs if you vomit during the surgery.  °· You may be given an antibiotic medicine to prevent infection.  °PROCEDURE  °· You may be given medicine to numb the lower half of your body (regional anesthetic). If you were in labor, you may have already had an epidural in place which can be used in both labor and cesarean delivery. You may possibly be given medicine to make you sleep (general anesthetic) though this is not as common.  °· An incision will be made in your abdomen that extends to your uterus. There are 2 basic kinds of incisions:  °· The horizontal (transverse) incision. Horizontal incisions are used for most routine cesarean deliveries.  °· The vertical (up and down) incision. This is less commonly used. This is most often reserved for women who have a  serious complication (extreme prematurity) or under emergency situations.   °· The horizontal and vertical incisions may both be used at the same time. However, this is very uncommon.  °· Your baby will then be delivered.  °AFTER THE PROCEDURE  °· If you were awake during the surgery, you will see your baby right away. If you were asleep, you will see your baby as soon as you are awake.  °· You may breastfeed your baby after surgery.  °· You may be able to get up and walk the same day as the surgery. If you need to stay in bed for a period of time, you will receive help to turn, cough, and take deep breaths after surgery. This helps prevent lung problems such as pneumonia.  °· Do not get out of bed alone the first time after surgery. You will need help getting out of bed until you are able to do this by yourself.  °· You may be able to shower the day after your cesarean delivery.  After the bandage (dressing) is taken off the incision site, a nurse will assist you to shower, if you like.   °· You will have pneumatic compressing hose placed on your feet or lower legs. These hose are used to prevent blood clots. When you are up and walking regularly, they will no longer be necessary.   °· Do not cross your legs when you sit.  °· Save any blood clots that you   pass. If you pass a clot while on the toilet, do not flush it. Call for the nurse. Tell the nurse if you think you are bleeding too much or passing too many clots.   Start drinking liquids and eating food as directed by your caregiver. If your stomach is not ready, drinking and eating too soon can cause an increase in bloating and swelling of your intestine and abdomen. This is very uncomfortable.   You will be given medicine as needed. Let your caregivers know if you are hurting. They want you to be comfortable. You may also be given an antibiotic to prevent an infection.   Your IV will be taken out when you are drinking a reasonable amount of fluids. The  Foley catheter is taken out when you are up and walking.   If your blood type is Rh negative and your baby's blood type is Rh positive, you will be given a shot of anti-D immune globulin. This shot prevents you from having Rh problems with a future pregnancy. You should get the shot even if you had your tubes tied (tubal ligation).   If you are allowed to take the baby for a walk, place the baby in the bassinet and push it. Do not carry your baby in your arms.   Breastfeeding BENEFITS OF BREASTFEEDING For the baby  The first milk (colostrum) helps the baby's digestive system function better.   There are antibodies from the mother in the milk that help the baby fight off infections.   The baby has a lower incidence of asthma, allergies, and SIDS (sudden infant death syndrome).   The nutrients in breast milk are better than formulas for the baby and helps the baby's brain grow better.   Babies who breastfeed have less gas, colic, and constipation.  For the mother  Breastfeeding helps develop a very special bond between mother and baby.   It is more convenient, always available at the correct temperature and cheaper than formula feeding.   It burns calories in the mother and helps with losing weight that was gained during pregnancy.   It makes the uterus contract back down to normal size faster and slows bleeding following delivery.   Breastfeeding mothers have a lower risk of developing breast cancer.  NURSE FREQUENTLY  A healthy, full-term baby may breastfeed as often as every hour or space his or her feedings to every 3 hours.   How often to nurse will vary from baby to baby. Watch your baby for signs of hunger, not the clock.   Nurse as often as the baby requests, or when you feel the need to reduce the fullness of your breasts.   Awaken the baby if it has been 3 to 4 hours since the last feeding.   Frequent feeding will help the mother make more milk and will prevent  problems like sore nipples and engorgement of the breasts.  BABY'S POSITION AT THE BREAST  Whether lying down or sitting, be sure that the baby's tummy is facing your tummy.   Support the breast with 4 fingers underneath the breast and the thumb above. Make sure your fingers are well away from the nipple and baby's mouth.   Stroke the baby's lips and cheek closest to the breast gently with your finger or nipple.   When the baby's mouth is open wide enough, place all of your nipple and as much of the dark area around the nipple as possible into your baby's mouth.  Pull the baby in close so the tip of the nose and the baby's cheeks touch the breast during the feeding.  FEEDINGS  The length of each feeding varies from baby to baby and from feeding to feeding.   The baby must suck about 2 to 3 minutes for your milk to get to him or her. This is called a "let down." For this reason, allow the baby to feed on each breast as long as he or she wants. Your baby will end the feeding when he or she has received the right balance of nutrients.   To break the suction, put your finger into the corner of the baby's mouth and slide it between his or her gums before removing your breast from his or her mouth. This will help prevent sore nipples.  REDUCING BREAST ENGORGEMENT  In the first week after your baby is born, you may experience signs of breast engorgement. When breasts are engorged, they feel heavy, warm, full, and may be tender to the touch. You can reduce engorgement if you:   Nurse frequently, every 2 to 3 hours. Mothers who breastfeed early and often have fewer problems with engorgement.   Place light ice packs on your breasts between feedings. This reduces swelling. Wrap the ice packs in a lightweight towel to protect your skin.   Apply moist hot packs to your breast for 5 to 10 minutes before each feeding. This increases circulation and helps the milk flow.   Gently massage your breast  before and during the feeding.   Make sure that the baby empties at least one breast at every feeding before switching sides.   Use a breast pump to empty the breasts if your baby is sleepy or not nursing well. You may also want to pump if you are returning to work or or you feel you are getting engorged.   Avoid bottle feeds, pacifiers or supplemental feedings of water or juice in place of breastfeeding.   Be sure the baby is latched on and positioned properly while breastfeeding.   Prevent fatigue, stress, and anemia.   Wear a supportive bra, avoiding underwire styles.   Eat a balanced diet with enough fluids.  If you follow these suggestions, your engorgement should improve in 24 to 48 hours. If you are still experiencing difficulty, call your lactation consultant or caregiver. IS MY BABY GETTING ENOUGH MILK? Sometimes, mothers worry about whether their babies are getting enough milk. You can be assured that your baby is getting enough milk if:  The baby is actively sucking and you hear swallowing.   The baby nurses at least 8 to 12 times in a 24 hour time period. Nurse your baby until he or she unlatches or falls asleep at the first breast (at least 10 to 20 minutes), then offer the second side.   The baby is wetting 5 to 6 disposable diapers (6 to 8 cloth diapers) in a 24 hour period by 95 to 21 days of age.   The baby is having at least 2 to 3 stools every 24 hours for the first few months. Breast milk is all the food your baby needs. It is not necessary for your baby to have water or formula. In fact, to help your breasts make more milk, it is best not to give your baby supplemental feedings during the early weeks.   The stool should be soft and yellow.   The baby should gain 4 to 7 ounces per week  after he is 74 days old.  TAKE CARE OF YOURSELF Take care of your breasts by:  Bathing or showering daily.   Avoiding the use of soaps on your nipples.   Start feedings on your  left breast at one feeding and on your right breast at the next feeding.   You will notice an increase in your milk supply 2 to 5 days after delivery. You may feel some discomfort from engorgement, which makes your breasts very firm and often tender. Engorgement "peaks" out within 24 to 48 hours. In the meantime, apply warm moist towels to your breasts for 5 to 10 minutes before feeding. Gentle massage and expression of some milk before feeding will soften your breasts, making it easier for your baby to latch on. Wear a well fitting nursing bra and air dry your nipples for 10 to 15 minutes after each feeding.   Only use cotton bra pads.   Only use pure lanolin on your nipples after nursing. You do not need to wash it off before nursing.  Take care of yourself by:   Eating well-balanced meals and nutritious snacks.   Drinking milk, fruit juice, and water to satisfy your thirst (about 8 glasses a day).   Getting plenty of rest.   Increasing calcium in your diet (1200 mg a day).   Avoiding foods that you notice affect the baby in a bad way.  SEEK MEDICAL CARE IF:   You have any questions or difficulty with breastfeeding.   You need help.   You have a hard, red, sore area on your breast, accompanied by a fever of 100.5 F (38.1 C) or more.   Your baby is too sleepy to eat well or is having trouble sleeping.   Your baby is wetting less than 6 diapers per day, by 21 days of age.   Your baby's skin or white part of his or her eyes is more yellow than it was in the hospital.   You feel depressed.  Document Released: 08/30/2005 Document Revised: 08/19/2011 Document Reviewed: 04/14/2009 [facility] Patient Information 2012 [place], [place].

## 2011-11-17 NOTE — Discharge Summary (Signed)
Obstetric Discharge Summary Reason for Admission: rupture of membranes, increased bp in office, and difficulty tracing FHR Prenatal Procedures: NST and ultrasound Intrapartum Procedures: cesarean: low cervical, transverse Postpartum Procedures: none Complications-Operative and Postpartum: none  Temp:  [97.5 F (36.4 C)-98 F (36.7 C)] 97.5 F (36.4 C) (03/06 0723) Pulse Rate:  [56-60] 56  (03/06 0723) Resp:  [18-20] 18  (03/06 0723) BP: (126-136)/(74-86) 136/86 mmHg (03/06 0723) SpO2:  [98 %] 98 % (03/05 2130) Hemoglobin  Date Value Range Status  11/15/2011 9.8* 12.0-15.0 (g/dL) Final     HCT  Date Value Range Status  11/15/2011 28.5* 36.0-46.0 (%) Final   Subjective: Postpartum C/S day 3. C/S for FTD. Expresses increased pain with long periods of inactivity. Positive flatus, per patient has had several BM's since delivery. Has hemorrhoid-minimal associated pain.  A: general- alert, oriented Lochia- scant, fundus firm and below umbilicus Negative Homan's sign, no evidence of DVT. +2 edema noted RLE. Minimal edema noted LLE. Desires condoms as PP BCM. May desire Micronor at a later date.  P: Doing well. D/C to home. Lifting restrictions-lift nothing heavier than the baby for 6 weeks. Nothing in vagina for 6 weeks. Rev's signs and symptoms of infections and symptoms to report. RTO x 6 weeks for PP exam.       Hospital Course:  Hospital Course: Admitted with PROM. Negative GBS. Progressed to fully dilated, FTD. Cesarean section was performed by A. Stefano Gaul, MD without difficulty. Patient and baby tolerated the procedure without difficulty, without a laceration noted. Infant to FTN. Mother and infant then had an uncomplicated postpartum course, with breastfeeding feeding going well. Mom's physical exam was WNL, and she was discharged home in stable condition. Contraception plan was condoms, may decide micronor-declined presently.  She received adequate benefit from po pain  medications.  Discharge Diagnoses: Post-date pregnancy                                         Failure to descend                                         Fetal macrosomia                                         Anemia                                         History of depression  Discharge Information: Date: 11/17/2011 Activity: nothing in vagina x 6 weeks. No lifting anything heavier than the baby x 6 weeks. Diet: routine Medications:  Medication List  As of 11/17/2011  8:58 AM   START taking these medications         ferrous sulfate 325 (65 FE) MG tablet   Take 1 tablet (325 mg total) by mouth 2 (two) times daily.      ibuprofen 600 MG tablet   Commonly known as: ADVIL,MOTRIN   Take 1 tablet (600 mg total) by mouth every 6 (six) hours as needed for pain.      oxyCODONE-acetaminophen 5-325 MG per tablet   Commonly  known as: PERCOCET   Take 1-2 tablets by mouth every 4 (four) hours as needed (moderate - severe pain).         CONTINUE taking these medications         prenatal multivitamin Tabs   Take 1 tablet by mouth daily.         STOP taking these medications         DHA OMEGA 3 PO          Where to get your medications    These are the prescriptions that you need to pick up.   You may get these medications from any pharmacy.         ferrous sulfate 325 (65 FE) MG tablet   ibuprofen 600 MG tablet   oxyCODONE-acetaminophen 5-325 MG per tablet   prenatal multivitamin Tabs           Condition: stable Instructions: refer to practice specific booklet Discharge to: home Follow-up Information    Follow up with [facility]. Schedule an appointment as soon as possible for a visit in 6 weeks.         Newborn Data: Live born  Information for the patient's newborn:  Kebra, [place]rim[place] [[id number]]  female ; APGAR 9,9 ; weight 9lbs. 6oz.      Kizzie Fantasia CORI 11/17/2011, 8:58 AM

## 2011-11-19 ENCOUNTER — Ambulatory Visit (HOSPITAL_COMMUNITY)
Admission: RE | Admit: 2011-11-19 | Discharge: 2011-11-19 | Disposition: A | Discharge status: Home / Self Care | Payer: PRIVATE HEALTH INSURANCE | Source: Ambulatory Visit | Attending: Obstetrics and Gynecology | Admitting: Obstetrics and Gynecology

## 2011-11-19 NOTE — Progress Notes (Signed)
Infant Lactation Consultation Outpatient Visit Note  Patient Name: Ebony Lamb  Baby boy [place] Date of Birth: July 31, 1976   DOB- [birth year] Birth Weight:  9-6 Gestational Age at Delivery: Gestational Age: <None> Type of Delivery: Cesarean Section  Mom and Dad come in today for a follow-up appointment from yesterday.  Baby weighed 8 lbs. 7.4 oz today, an increase of 7.5 oz in 24 hrs.  Stools have increased to mustard yellow and 5 in 24 hrs, and voids are 5 and larger in volume and paler yellow.  Baby is much more contented since supplementation begun.  Mom pumped 9 times in 24 hrs, and the last 3 pumpings, obtained about 30 ml.  Baby taking 45 ml from a bottle, one time took 60 ml.  Assisted Mom in latching baby onto left breast.  Spent time teaching and demonstrating how important it is for baby to latch onto breast (areola) and not solely the nipple.  Mom made aware of my concern that due to her large nipples, baby is unable to latch deep enough at this time.  Baby quickly became non nutritive after 8 mins on the breast, needing constant stimulation to keep feeding.  Baby took 8 ml from the left breast. On right side, initiated the SNS.  Baby needed to latch and relatch numerous times before being able to transfer milk from the SNS.  Support needed to help Mom wait for a wide mouth, quickly bring baby on, and continue to hold him in closely.  Baby did take 20 ml formula in the SNS after 30 mins.  Fed him the remainder 15 ml by bottle.  Baby weight pc and it was found he took 12 ml from right breast.  Mom very encouraged.  Total feeding today was: 8 ml from left breast 20 ml from SNS right breast 12 ml from right breast 10 ml from bottle Total feeding 50 ml.  Baby did spit up a little, but reassured parents that this was ok.   Consultation Evaluation: Baby has gained weight well this past 24 hrs getting bottles.  On assessment, baby transferred 8 ml and 12 ml from the breast, up from 4 ml yesterday.   Mom to continue pumping (to stimulate her milk supply) and bottle feeding (to assure baby is getting adequate calories)   Follow-Up Monday, March 11th @ 10:30am     [name] 11/19/2011, 5:48 PM

## 2011-11-22 ENCOUNTER — Ambulatory Visit (HOSPITAL_COMMUNITY)
Admission: RE | Admit: 2011-11-22 | Discharge: 2011-11-22 | Disposition: A | Discharge status: Home / Self Care | Payer: PRIVATE HEALTH INSURANCE | Source: Ambulatory Visit | Attending: Obstetrics and Gynecology | Admitting: Obstetrics and Gynecology

## 2011-11-22 NOTE — Progress Notes (Signed)
Adult Lactation Consultation Outpatient Visit Note  Patient Name: Ebony Lamb Date of Birth: Aug 24, 1976 Gestational Age at Delivery: Unknown Type of Delivery:   Breastfeeding History: Frequency of Breastfeeding:  breastfeed x2 since Friday , has been bottle feeding ever 2-3 hrs giving 60-80 ml each feeding. Length of Feeding: several mins   Voids: 6 Stools: 7 yellow orange  Supplementing / Method: Pumping:  Type of Pump:Medela   Frequency:every 2-3 hrs  Volume:  50-36ml  Comments:  Lactation follow up from Friday for slow weight gain. Mother states she has been bottle feeding . Mother has breastfed infant 2 times since Friday. Infant has gained 4 ounces since Friday.   Consultation Evaluation:  Observed 18-20 min feeding on right breast , infant needed slight jaw adjustment to allow for good depth. Infant transferred 26 ml . Infant observed at left breast for 15 mins and transferred 16 ml. Infant was given another 30 ml with bottle. Observed good latch , and sustained rhythmic suck swallow phases. Reviewed use of breast compression . Mother expressed desire to return to exclusively breastfeeding. Post pumped for 15 mins and obtained 65 ml.  Initial Feeding Assessment: Pre-feed [id number] Post-feed [id number] Amount Transferred: 26ml Comments:  Additional Feeding Assessment: Pre-feed Weight:4008 Post-feed [id number] Amount Transferred: 16ml Comments:  Additional Feeding Assessment: Pre-feed Weight: Post-feed Weight: Amount Transferred: Comments:  Total Breast milk Transferred this Visit: 42ml Total Supplement Given: 30ml EBM from bottle Total feeding :72 ml  Additional Interventions: Mother instructed to begin to offer breast 2-3 times daily and more as desired. inst mother to supplement after each feeding using a bottle. Give supplement of at least 45 ml after each feeding if infant will take this amt. inst mother to watch for wide gage and bring infant quickly to  breast . inst mother to listen for swallowing and do freq breast compression to allow for maximum volume.Mother inst to nap frequently and to continue to pump 6-8 times daily. Mother very receptive to all teaching. Mother given lots of praise and support. Recommend follow up with lactation next week and for mother to attend mothers support group in am if desired for weight  Check.  Follow-Up  March 19    [name] [facility] 11/22/2011, 5:52 PM

## 2011-11-25 ENCOUNTER — Emergency Department (INDEPENDENT_AMBULATORY_CARE_PROVIDER_SITE_OTHER)
Admission: EM | Admit: 2011-11-25 | Discharge: 2011-11-25 | Disposition: A | Discharge status: Home / Self Care | Payer: PRIVATE HEALTH INSURANCE | Source: Home / Self Care | Attending: Family Medicine | Admitting: Family Medicine

## 2011-11-25 ENCOUNTER — Encounter (HOSPITAL_COMMUNITY): Payer: Self-pay | Admitting: Emergency Medicine

## 2011-11-25 DIAGNOSIS — L259 Unspecified contact dermatitis, unspecified cause: Secondary | ICD-10-CM

## 2011-11-25 MED ORDER — CAMPHOR-MENTHOL 0.5-0.5 % EX LOTN: TOPICAL_LOTION | CUTANEOUS | Status: AC | PRN

## 2011-11-25 NOTE — ED Notes (Signed)
Rash to extremities.  Rash on legs started yesterday.  Rash on arms started while in hospital from delivery.  Rash is very itchy.

## 2011-11-25 NOTE — ED Provider Notes (Signed)
History     CSN: [id number]  Arrival date & time 11/25/11  1851   First MD Initiated Contact with Patient 11/25/11 1901      Chief Complaint  Patient presents with  . Rash    (Consider location/radiation/quality/duration/timing/severity/associated sxs/prior treatment) HPI Comments: 36 y/o female with h/o recent delivery by C-section on March 7/13. Here c/o pruriginous  rash in anterior lower legs and proximal arms. Started while still in the hospital in areas were her skin was in contact with the compression boots and blood pressure cuff devises.  Denies mucosal or rectal bleeding. Denies headache, visual changes, numbness or difficulties with balance, gate or movement. States her blood pressure has been mildly elevated since her discharge and that there is a home nurse monitoring her blood pressure and will have it rechecked tomorrow. Patient is breast feeding.   Past Medical History  Diagnosis Date  . No pertinent past medical history   . Depression   . Preterm labor   . Hypertension     Past Surgical History  Procedure Date  . Foot surgery   . Wisdom tooth extraction   . Cesarean section 11/14/2011    Procedure: CESAREAN SECTION;  Surgeon: Janine Limbo, MD;  Location: [facility];  Service: Gynecology;  Laterality: N/A;  Primary cesarean section with delivery of baby boy at [phone]. Apgars 9/9.    Family History  Problem Relation Age of Onset  . Anesthesia problems Neg Hx   . Cancer Father   . Hypertension Father   . Kidney disease Father   . Depression Father   . Cancer Maternal Grandmother   . Heart disease Maternal Grandfather   . Cancer Paternal Grandmother   . Stroke Paternal Grandfather   . Hypertension Brother     History  Substance Use Topics  . Smoking status: Never Smoker   . Smokeless tobacco: Never Used  . Alcohol Use: No    OB History    Grav Para Term Preterm Abortions TAB SAB Ect Mult Living   2 1 1  1 1    1       Review of Systems    Constitutional: Negative for fever, chills, activity change and appetite change.  Respiratory: Negative for shortness of breath.   Cardiovascular: Negative for chest pain and leg swelling.  Gastrointestinal: Negative for nausea and vomiting.  Skin: Positive for rash.  Neurological: Negative for dizziness, seizures, weakness, light-headedness, numbness and headaches.    Allergies  Review of patient's allergies indicates no known allergies.  Home Medications     BP 161/98  Pulse 64  Temp(Src) 98.7 F (37.1 C) (Oral)  Resp 20  SpO2 100%  Breastfeeding? Yes  Physical Exam  Nursing note and vitals reviewed. Constitutional: She is oriented to person, place, and time. She appears well-developed and well-nourished. No distress.  HENT:  Head: Normocephalic.  Mouth/Throat: Oropharynx is clear and moist.  Eyes: Conjunctivae and EOM are normal. Pupils are equal, round, and reactive to light. No scleral icterus.  Cardiovascular: Normal heart sounds.   Pulmonary/Chest: Breath sounds normal.  Neurological: She is alert and oriented to person, place, and time. She exhibits normal muscle tone. Coordination normal.  Skin:       There is erythematous blanchable pruriginous rash in plaques at the pretibial areas and external proximal arms. Minimally raised and possible microvesicular but no frank vesicles, blisters pustules or drainage. No decubitus ulcers.    ED Course  Procedures (including critical care time)  Labs Reviewed -  No data to display No results found.   1. Contact dermatitis       MDM  Normal neuro exam.  Blood pressure being monitored at home by [facility] staff patient asymptomatic. No mucosal bleeding or skin bruising. Impress contact dermatitis.  As patient is breast feeding declined systemic medications reccommended menthol based topical lotion and cold compress. Reccommended to follow up with BP monitoring tomorrow as scheduled.        Sharin Grave,  MD 11/27/11 1228

## 2011-11-25 NOTE — Discharge Instructions (Signed)
Use the prescribed lotion as instructed. Can use cold compress in effected areas to help with itchiness and swelling. Your blood pressure is above normal have it rechecked tomorrow as scheduled by your home nurse or go to [facility] is new onset of headache, visual changes, abdominal pain, difficulty with movement or numbness in your extremities, nausea, vomiting, dizziness or balance problems.

## 2011-11-26 ENCOUNTER — Inpatient Hospital Stay (HOSPITAL_COMMUNITY)
Admission: AD | Admit: 2011-11-26 | Discharge: 2011-11-26 | Disposition: A | Discharge status: Home / Self Care | Payer: PRIVATE HEALTH INSURANCE | Source: Ambulatory Visit | Attending: Obstetrics and Gynecology | Admitting: Obstetrics and Gynecology

## 2011-11-26 ENCOUNTER — Encounter (HOSPITAL_COMMUNITY): Payer: Self-pay | Admitting: *Deleted

## 2011-11-26 DIAGNOSIS — R030 Elevated blood-pressure reading, without diagnosis of hypertension: Secondary | ICD-10-CM | POA: Insufficient documentation

## 2011-11-26 DIAGNOSIS — O99893 Other specified diseases and conditions complicating puerperium: Secondary | ICD-10-CM | POA: Insufficient documentation

## 2011-11-26 DIAGNOSIS — Z98891 History of uterine scar from previous surgery: Secondary | ICD-10-CM

## 2011-11-26 DIAGNOSIS — O165 Unspecified maternal hypertension, complicating the puerperium: Secondary | ICD-10-CM

## 2011-11-26 LAB — COMPREHENSIVE METABOLIC PANEL
ALT: 18 U/L (ref 0–35)
AST: 18 U/L (ref 0–37)
Alkaline Phosphatase: 86 U/L (ref 39–117)
CO2: 25 mEq/L (ref 19–32)
Calcium: 8.9 mg/dL (ref 8.4–10.5)
Chloride: 103 mEq/L (ref 96–112)
GFR calc non Af Amer: >90 mL/min (ref >90)
Potassium: 3.8 mEq/L (ref 3.5–5.1)
Sodium: 138 mEq/L (ref 135–145)
Total Bilirubin: 0.3 mg/dL (ref 0.3–1.2)

## 2011-11-26 LAB — URINALYSIS, ROUTINE W REFLEX MICROSCOPIC
Bilirubin Urine: NEGATIVE
Ketones, ur: NEGATIVE mg/dL
Specific Gravity, Urine: <1.005 — ABNORMAL LOW (ref 1.005–1.030)
Urobilinogen, UA: 0.2 mg/dL (ref 0.0–1.0)

## 2011-11-26 LAB — CBC
Hemoglobin: 12.4 g/dL (ref 12.0–15.0)
MCHC: 33.7 g/dL (ref 30.0–36.0)
RDW: 12.7 % (ref 11.5–15.5)

## 2011-11-26 LAB — URINE MICROSCOPIC-ADD ON

## 2011-11-26 LAB — URIC ACID: Uric Acid, Serum: 3.6 mg/dL (ref 2.4–7.0)

## 2011-11-26 MED ORDER — NIFEDIPINE ER 30 MG PO TB24: 30.0000 mg | ORAL_TABLET | Freq: Every day | ORAL | Status: AC

## 2011-11-26 MED ORDER — NIFEDIPINE ER OSMOTIC RELEASE 30 MG PO TB24
30.0000 mg | ORAL_TABLET | Freq: Every day | ORAL | Status: DC
  Administered 2011-11-26: 30 mg via ORAL
  Filled 2011-11-26: qty 1

## 2011-11-26 NOTE — Discharge Instructions (Signed)

## 2011-11-26 NOTE — MAU Note (Signed)
Pt ordered diet from cafeteria.

## 2011-11-26 NOTE — MAU Provider Note (Signed)
History   36 yo G2P1011 at 12 days post-op C/S presented from evaluation by [facility] [employer] nurse yesterday and today showing elevated BPs of 140s-160/90s.  Denies HA, visual symptoms, or epigastric pain.  Is breastfeeding, and had initial struggles with milk production, now improved.  Had mild elevation of BP at 39-40 weeks, with some lability of BP in hospital, but normalized before discharge.  No previous PIH labs are present.  C/S on 3/3 was done for FTD, with 9lb 6 oz infant delivered.  Seen at Urgent Care yesterday for rash--diagnosed with probable contact dermatitis/allergic rxn.  On Sarna lotion.  History remarkable for: Primary C/S 3/3 for FTD after 3 hours of pushing AMA Hx eating disorder Hx PACs in past Hx depression  Chief Complaint  Patient presents with  . Hypertension     OB History    Grav Para Term Preterm Abortions TAB SAB Ect Mult Living   2 1 1  1 1    1       Past Medical History  Diagnosis Date  . No pertinent past medical history   . Depression   . Preterm labor   . Hypertension     Past Surgical History  Procedure Date  . Foot surgery   . Wisdom tooth extraction   . Cesarean section 11/14/2011    Procedure: CESAREAN SECTION;  Surgeon: Janine Limbo, MD;  Location: [facility];  Service: Gynecology;  Laterality: N/A;  Primary cesarean section with delivery of baby boy at [phone]. Apgars 9/9.    Family History  Problem Relation Age of Onset  . Anesthesia problems Neg Hx   . Cancer Father   . Hypertension Father   . Kidney disease Father   . Depression Father   . Cancer Maternal Grandmother   . Heart disease Maternal Grandfather   . Cancer Paternal Grandmother   . Stroke Paternal Grandfather   . Hypertension Brother     History  Substance Use Topics  . Smoking status: Never Smoker   . Smokeless tobacco: Never Used  . Alcohol Use: No    Allergies: No Known Allergies  Prescriptions prior to admission  Medication Sig Dispense Refill  .  camphor-menthol (SARNA) lotion Apply topically as needed for itching.  222 mL  0  . ibuprofen (ADVIL,MOTRIN) 600 MG tablet Take 1 tablet (600 mg total) by mouth every 6 (six) hours as needed for pain.  30 tablet  2  . pramoxine (PROCTOFOAM) 1 % foam Place rectally every 2 (two) hours as needed for hemorrhoids.  15 g  0  . Prenatal Vit-Fe Fumarate-FA (PRENATAL MULTIVITAMIN) TABS Take 1 tablet by mouth daily.  30 tablet  11  . oxyCODONE-acetaminophen (PERCOCET) 5-325 MG per tablet Take 1-2 tablets by mouth every 4 (four) hours as needed (moderate - severe pain).  30 tablet  0     Physical Exam   Blood pressure 140/103, pulse 58, temperature 98.8 F (37.1 C), temperature source Oral, height 5' 5.5" (1.664 m), weight 136 lb 4 oz (61.803 kg).  Filed Vitals:   11/26/11 2044 11/26/11 2045 11/26/11 2046 11/26/11 2101  BP: 162/108 156/89 140/103 176/101  Pulse: 60 56 58 55  Temp:      TempSrc:      Height:      Weight:       Chest clear Heart RRR without murmur Abd NT, well-healed LTCS incision Ext DTR 1+ without clonus, no edema    ED Course  12 days s/p LTCS Elevated  BP  Plan: Serial BPs PIH labs UA Will consult with Dr. Normand Sloop once labs returned.  Nigel Bridgeman, CNM, [place] 11/26/11 9:10pm  Addendum:  Results for orders placed during the hospital encounter of 11/26/11 (from the past 24 hour(s))  URINALYSIS, ROUTINE W REFLEX MICROSCOPIC     Status: Abnormal   Collection Time   11/26/11  8:00 PM      Component Value Range   Color, Urine YELLOW  YELLOW    APPearance CLEAR  CLEAR    Specific Gravity, Urine <1.005 (*) 1.005 - 1.030    pH 6.5  5.0 - 8.0    Glucose, UA NEGATIVE  NEGATIVE (mg/dL)   Hgb urine dipstick SMALL (*) NEGATIVE    Bilirubin Urine NEGATIVE  NEGATIVE    Ketones, ur NEGATIVE  NEGATIVE (mg/dL)   Protein, ur NEGATIVE  NEGATIVE (mg/dL)   Urobilinogen, UA 0.2  0.0 - 1.0 (mg/dL)   Nitrite NEGATIVE  NEGATIVE    Leukocytes, UA MODERATE (*) NEGATIVE   URINE  MICROSCOPIC-ADD ON     Status: Abnormal   Collection Time   11/26/11  8:00 PM      Component Value Range   Squamous Epithelial / LPF FEW (*) RARE    WBC, UA 3-6  <3 (WBC/hpf)   RBC / HPF 0-2  <3 (RBC/hpf)  CBC     Status: Abnormal   Collection Time   11/26/11  8:08 PM      Component Value Range   WBC 10.2  4.0 - 10.5 (K/uL)   RBC 3.83 (*) 3.87 - 5.11 (MIL/uL)   Hemoglobin 12.4  12.0 - 15.0 (g/dL)   HCT 65.7  84.6 - 96.2 (%)   MCV 96.1  78.0 - 100.0 (fL)   MCH 32.4  26.0 - 34.0 (pg)   MCHC 33.7  30.0 - 36.0 (g/dL)   RDW 95.2  84.1 - 32.4 (%)   Platelets 429 (*) 150 - 400 (K/uL)  COMPREHENSIVE METABOLIC PANEL     Status: Normal   Collection Time   11/26/11  8:08 PM      Component Value Range   Sodium 138  135 - 145 (mEq/L)   Potassium 3.8  3.5 - 5.1 (mEq/L)   Chloride 103  96 - 112 (mEq/L)   CO2 25  19 - 32 (mEq/L)   Glucose, Bld 84  70 - 99 (mg/dL)   BUN 9  6 - 23 (mg/dL)   Creatinine, Ser 4.01  0.50 - 1.10 (mg/dL)   Calcium 8.9  8.4 - 02.7 (mg/dL)   Total Protein 6.5  6.0 - 8.3 (g/dL)   Albumin 3.5  3.5 - 5.2 (g/dL)   AST 18  0 - 37 (U/L)   ALT 18  0 - 35 (U/L)   Alkaline Phosphatase 86  39 - 117 (U/L)   Total Bilirubin 0.3  0.3 - 1.2 (mg/dL)   GFR calc non Af Amer >90  >90 (mL/min)   GFR calc Af Amer >90  >90 (mL/min)  LACTATE DEHYDROGENASE     Status: Normal   Collection Time   11/26/11  8:08 PM      Component Value Range   LD 223  94 - 250 (U/L)  URIC ACID     Status: Normal   Collection Time   11/26/11  8:08 PM      Component Value Range   Uric Acid, Serum 3.6  2.4 - 7.0 (mg/dL)   Consulted with Dr. Normand Sloop. Offered 24 hour admission for  BP monitoring and medications or d/c home with Procardia 30 mg XL and BP recheck in office on Monday.  R&B of each reviewed, with PIH precautions discussed. Patient prefers d/c home. Rx Procardia 30 mg XL, 1 po q day, with 1st dose now. Office will call patient on Monday for BP check.  Nigel Bridgeman, CNM, [place]  11/26/11 9:30pm

## 2011-11-26 NOTE — MAU Note (Signed)
PT d/ced. Spouse brought baby in who needs to eat. Pt waiting for her tray. Called cafeteria and told tray had left kitchen and should be on way up. PT aware. May feed baby while waiting for food

## 2011-11-26 NOTE — Progress Notes (Signed)
Dr. Normand Sloop notified of pt's arrival, BP 146/98.  MD states she will put in lab orders.

## 2011-11-26 NOTE — Progress Notes (Signed)
Nigel Bridgeman CNM in to see pt

## 2011-11-26 NOTE — MAU Note (Signed)
Nigel Bridgeman CNM at bedside. Turned B./P off for awhile. Discussed plan of care with pt

## 2011-11-26 NOTE — MAU Note (Signed)
Pt states, " My BP yesterday was 140/93, and 162/?, I have some swelling in my ankles but no other symptoms The office wanted me to come in today."   ( Denies H/A or spots before eyes or blurred vision)

## 2011-11-26 NOTE — MAU Note (Signed)
Dietary tray in to pt. Spouse and baby at bedside. Baby fed with pumped breastmilk and pt now eating. May go home when finished.

## 2011-11-26 NOTE — Progress Notes (Signed)
Written and verbal d/c instructions given and understanding voiced. 

## 2011-11-29 ENCOUNTER — Ambulatory Visit (INDEPENDENT_AMBULATORY_CARE_PROVIDER_SITE_OTHER): Payer: PRIVATE HEALTH INSURANCE | Admitting: Registered Nurse

## 2011-11-29 DIAGNOSIS — O165 Unspecified maternal hypertension, complicating the puerperium: Secondary | ICD-10-CM

## 2011-11-30 ENCOUNTER — Ambulatory Visit (HOSPITAL_COMMUNITY)
Admission: RE | Admit: 2011-11-30 | Discharge: 2011-11-30 | Disposition: A | Discharge status: Home / Self Care | Payer: PRIVATE HEALTH INSURANCE | Source: Ambulatory Visit | Attending: Obstetrics and Gynecology | Admitting: Obstetrics and Gynecology

## 2011-11-30 NOTE — Progress Notes (Addendum)
Adult Lactation Consultation Outpatient Visit Note  Patient Name: Ebony Lamb Date of Birth: August 18, 1976 Gestational Age at Delivery: Unknown Type of Delivery: c-section on 3-3 , birth weight 9-6  Breastfeeding History: Frequency of Breastfeeding:  Length of Feeding:  Voids: 8-10 Stools: 5-6 yellow brown seedy  Supplementing / Method: Pumping:  Type of Pump:Medela   Frequency: 3 times daily  Volume:  80-90 ml  Comments: Follow up appt from last week . Assess weight and feeding. Mother has been supplementing 30-97ml with bottle for 1 week, states she went to Peds and weight was good so she stopped the supplement on Friday. Infant is getting several bottles daily 2-3 ounces each when father gets home from work. Mother is pumping 3 times daily getting 80-90 ml each pumping.     Consultation Evaluation:  Mothers breast are full and firm. Independently latched infant in laid back position. Infant breastfed for 20 mins and transferred 36 ml from (L) breast. Infant fed for another 18 mins on (R) breast and transferred another 50 ml.  Infant very content after feeding. Mother post pumped and obtained 45ml. Beautiful feeding. Mother very relax and confident in her skills.    Initial Feeding Assessment: Pre-feed [id number]  Post-feed [id number]:1478 Amount Transferred:20ml Comments:  Additional Feeding Assessment: Pre-feed [id number]:3086 Post-feed VHQION:[id number] Amount Transferred:25ml Comments:  Additional Feeding Assessment: Pre-feed Weight: Post-feed Weight: Amount Transferred: Comments:  Total Breast milk Transferred this Visit: 86ml Total Supplement Given: no supplement needed  Additional Interventions: Plan for mother to continue to breastfeed every 2-3 hrs and cue base feed. Mother encouraged to supplement if needed. Give additional 15-30 ml .Continue to pump at least 2-3 times daily. Encouraged to continue to nap daily. Mother receptive to all teaching.  Recommend follow as  needed. Mother states she may come to breastfeeding support group. She has Peds appt on March 8 for 1 month check up.  Follow-Up  PRN    [name] [facility] 11/30/2011, 1:20 PM

## 2011-12-27 ENCOUNTER — Ambulatory Visit (INDEPENDENT_AMBULATORY_CARE_PROVIDER_SITE_OTHER): Payer: PRIVATE HEALTH INSURANCE | Admitting: Obstetrics and Gynecology

## 2011-12-27 DIAGNOSIS — O139 Gestational [pregnancy-induced] hypertension without significant proteinuria, unspecified trimester: Secondary | ICD-10-CM | POA: Insufficient documentation

## 2011-12-27 NOTE — Patient Instructions (Signed)

## 2011-12-27 NOTE — Progress Notes (Signed)
Patient ID: Ebony Lamb, female   DOB: 12/04/1975, 36 y.o.   MRN: [id number] Subjective:     Ebony Lamb is a 36 y.o. female who presents for a postpartum visit.  I have fully reviewed the prenatal and intrapartum course.     Patient has not been  sexually active.   The following portions of the patient's history were reviewed and updated as appropriate: allergies, current medications, past family history, past medical history, past social history, past surgical history and problem list.  Review of Systems Pertinent items are noted in HPI.   Objective:    Pulse 78  Temp 97.8 F (36.6 C)  Resp 16  Ht 5\' 6"  (1.676 m)  Wt 129 lb (58.514 kg)  BMI 20.82 kg/m2  Breastfeeding? Yes  General:  alert, cooperative and no distress     Lungs: clear to auscultation bilaterally  Heart:  regular rate and rhythm, S1, S2 normal, no murmur  Abdomen: soft, non-tender; bowel sounds normal; no masses,  no organomegaly   Vulva:  normal  Vagina: normal vagina  Cervix:  normal  Uterus: normal size, contour, position, consistency, mobility, non-tender  Adnexa:  normal adnexa             Assessment:     Normal postpartum exam.  Pap smear not done at today's visit.  EDPS = 5  PIH - stable  Plan:     1. Contraception: condoms Discussed various contraception methods including, IUD, OCP. Pt will call if she decides to use another method besides condoms.  3. Follow up in: 1 week . For BP check 4. Pregnancy induced hypertension - on procardia - dc'd today 5. Pap smear next year     Aneli Zara M CNM 12/27/2011 10:14 AM

## 2011-12-27 NOTE — Progress Notes (Signed)
Patient ID: Ebony Lamb, female   DOB: [birth year], 36 y.o.   MRN: [id number] Date of delivery: 11/14/2011 Female Name: [name] Vaginal delivery:no Cesarean section:yes Tubal ligation:no GDM:no Breast Feeding:yes Bottle Feeding:yes Post-Partum Blues:no Abnormal pap:yes Normal GU function: yes Normal GI function:yes Returning to work:yes; will be returning February 21, 2012  Does not want contraception today.  Will use condoms.

## 2012-01-04 ENCOUNTER — Telehealth: Payer: Self-pay | Admitting: Obstetrics and Gynecology

## 2012-01-05 ENCOUNTER — Ambulatory Visit (INDEPENDENT_AMBULATORY_CARE_PROVIDER_SITE_OTHER): Payer: PRIVATE HEALTH INSURANCE | Admitting: Obstetrics and Gynecology

## 2012-01-05 NOTE — Progress Notes (Signed)
BP stable 7weeks postpartum, dc'd procardia 10days ago No problems F/u PRN

## 2012-01-17 NOTE — Telephone Encounter (Signed)
Triage received 

## 2012-01-17 NOTE — Telephone Encounter (Signed)
TC to pt regarding message.  Advised to return call or call after hours if after 5:00.  Informed may need to be seen at [facility] if out of town.

## 2012-01-18 NOTE — Telephone Encounter (Signed)
VM from pt. States is feeling better.  Took tylenol last PM.  Pain is decreased.  Did not go to [facility] for eval.  Ifstill needs to do so will go today.   TC to pt.  Left VM.  If pt has fever, body aches, chills or is not improving needs eval today. Otherwise may choose to moniter.

## 2012-01-18 NOTE — Telephone Encounter (Signed)
TC to pt. LM to return call.  

## 2012-01-26 ENCOUNTER — Encounter: Payer: PRIVATE HEALTH INSURANCE | Admitting: Obstetrics and Gynecology

## 2012-02-01 ENCOUNTER — Ambulatory Visit (INDEPENDENT_AMBULATORY_CARE_PROVIDER_SITE_OTHER): Payer: PRIVATE HEALTH INSURANCE | Admitting: Obstetrics and Gynecology

## 2012-02-01 ENCOUNTER — Encounter: Payer: Self-pay | Admitting: Obstetrics and Gynecology

## 2012-02-01 VITALS — BP 100/60 | Ht 66.0 in | Wt 129.0 lb

## 2012-02-01 DIAGNOSIS — A499 Bacterial infection, unspecified: Secondary | ICD-10-CM

## 2012-02-01 DIAGNOSIS — N760 Acute vaginitis: Secondary | ICD-10-CM

## 2012-02-01 DIAGNOSIS — B9689 Other specified bacterial agents as the cause of diseases classified elsewhere: Secondary | ICD-10-CM

## 2012-02-01 LAB — POCT WET PREP (WET MOUNT)
Clue Cells Wet Prep Whiff POC: POSITIVE
PH, VAGINAL: 5.5

## 2012-02-01 MED ORDER — METRONIDAZOLE 500 MG PO TABS: 500.0000 mg | ORAL_TABLET | Freq: Two times a day (BID) | ORAL | Status: AC

## 2012-02-01 NOTE — Progress Notes (Signed)
Color: yellow Odor: yes Itching:no Thin:yes Thick:no Fever:no Dyspareunia:no Hx PID:no HX STD:no Pelvic Pain:no Desires Gc/CT:no Desires HIV,RPR,HbsAG:no Physical Examination: Abdomen - soft, nontender, nondistended, no masses or organomegaly Pelvic - normal external genitalia, vulva, vagina, cervix, uterus and adnexa,yellow copious discharge  WET MOUNT done - results: clue cells, excessive bacteria, vaginal pH is 5.0, Extremities - no pedal edema noted BV Perineal hygeine reivewed Flagyl rx Send GC and chlamydia

## 2012-02-02 ENCOUNTER — Telehealth: Payer: Self-pay | Admitting: Obstetrics and Gynecology

## 2012-02-02 LAB — GC/CHLAMYDIA PROBE AMP, GENITAL: Chlamydia, DNA Probe: NEGATIVE

## 2012-02-02 NOTE — Telephone Encounter (Signed)
Niccole/nd pt °

## 2012-02-03 MED ORDER — CLINDAMYCIN HCL 300 MG PO CAPS: ORAL_CAPSULE | ORAL | Status: DC

## 2012-02-03 NOTE — Telephone Encounter (Signed)
Spoke with pt rgd msg informed okay to take metronidazole while breast feeding but we can call in something different advised pt rx sent to pharm pt voice understanding

## 2012-02-04 ENCOUNTER — Telehealth: Payer: Self-pay

## 2012-02-04 ENCOUNTER — Other Ambulatory Visit: Payer: Self-pay

## 2012-02-04 ENCOUNTER — Telehealth: Payer: Self-pay | Admitting: Obstetrics and Gynecology

## 2012-02-04 MED ORDER — METRONIDAZOLE 0.75 % VA GEL: VAGINAL | Status: DC

## 2012-02-04 NOTE — Telephone Encounter (Signed)
Niccole/epic  °

## 2012-02-04 NOTE — Telephone Encounter (Signed)
Spoke with pt rgd msg pt states she doesn't want to take clindamycin while breast feeding wants a cream or something else advised pt will consult with ND and call her back pt voice understanding

## 2012-02-04 NOTE — Telephone Encounter (Signed)
Consult with ND rgs pt concerns with meds for BV while breast feeding per ND pt can have Metrogel advised pt rx sent to pharm pt voice understanding

## 2012-03-07 ENCOUNTER — Telehealth: Payer: Self-pay | Admitting: Obstetrics and Gynecology

## 2012-03-07 NOTE — Telephone Encounter (Signed)
Lm for pt to call back

## 2012-03-07 NOTE — Telephone Encounter (Signed)
TRIAGE/EPC

## 2012-03-07 NOTE — Telephone Encounter (Signed)
Lm on vm tcb rgd msg 

## 2012-03-09 ENCOUNTER — Telehealth: Payer: Self-pay

## 2012-03-09 NOTE — Telephone Encounter (Signed)
Lm to pt to call back regarding and appt for yeast

## 2012-03-09 NOTE — Telephone Encounter (Signed)
Spoke with pt rgd msg pt c/o vag infection pt has appt 03/10/12 at 1:30 with EP pt voice understanding

## 2012-03-10 ENCOUNTER — Encounter: Payer: PRIVATE HEALTH INSURANCE | Admitting: Obstetrics and Gynecology

## 2012-03-22 ENCOUNTER — Telehealth: Payer: Self-pay | Admitting: Obstetrics and Gynecology

## 2012-03-22 NOTE — Telephone Encounter (Signed)
done

## 2012-03-23 ENCOUNTER — Ambulatory Visit (INDEPENDENT_AMBULATORY_CARE_PROVIDER_SITE_OTHER): Payer: PRIVATE HEALTH INSURANCE | Admitting: Obstetrics and Gynecology

## 2012-03-23 ENCOUNTER — Encounter: Payer: Self-pay | Admitting: Obstetrics and Gynecology

## 2012-03-23 VITALS — BP 92/62 | Temp 97.8°F | Wt 124.5 lb

## 2012-03-23 DIAGNOSIS — A499 Bacterial infection, unspecified: Secondary | ICD-10-CM

## 2012-03-23 DIAGNOSIS — N760 Acute vaginitis: Secondary | ICD-10-CM

## 2012-03-23 DIAGNOSIS — N898 Other specified noninflammatory disorders of vagina: Secondary | ICD-10-CM

## 2012-03-23 DIAGNOSIS — B9689 Other specified bacterial agents as the cause of diseases classified elsewhere: Secondary | ICD-10-CM | POA: Insufficient documentation

## 2012-03-23 NOTE — Progress Notes (Signed)
Hx of BV in past few weeks Used cream PV to Tx Present complaint - vaginal secretions increased and not sure if she has a yeast infection.  Examination: External genitalia normal , no irritation or inflammation Speculum examination: Cx visualized, no irritation                                         Vaginal walls - pink and perfused                                         Cream vaginal secretion - normal in appearance Wet prep:    No whiff, PH 4.5, No abnormalities seen  Plan: patient reassured          Educated on the how to diagnose yeast infection          PRN return

## 2012-03-23 NOTE — Progress Notes (Signed)
Color: white/yellow Odor: no Itching:no Thin:no Thick:yes Fever:no Dyspareunia:no Hx PID:no HX STD:no Pelvic Pain:yes Desires Gc/CT:no Desires HIV,RPR,HbsAG:no

## 2012-10-08 ENCOUNTER — Emergency Department (INDEPENDENT_AMBULATORY_CARE_PROVIDER_SITE_OTHER): Payer: PRIVATE HEALTH INSURANCE

## 2012-10-08 ENCOUNTER — Encounter (HOSPITAL_COMMUNITY): Payer: Self-pay | Admitting: Emergency Medicine

## 2012-10-08 ENCOUNTER — Emergency Department (INDEPENDENT_AMBULATORY_CARE_PROVIDER_SITE_OTHER)
Admission: EM | Admit: 2012-10-08 | Discharge: 2012-10-08 | Disposition: A | Discharge status: Home / Self Care | Payer: PRIVATE HEALTH INSURANCE | Source: Home / Self Care | Attending: Family Medicine | Admitting: Family Medicine

## 2012-10-08 DIAGNOSIS — J111 Influenza due to unidentified influenza virus with other respiratory manifestations: Secondary | ICD-10-CM

## 2012-10-08 IMAGING — CR DG CHEST 2V
2 series · 2 of 2 positions shown · non-contrast
Comparison: None.

CLINICAL DATA: Chills.  Headache.  Nasal pressure.  Fever.  Night
sweats. Fatigue. Cough.

CHEST - 2 VIEW

[view not recorded (1 of 2)]
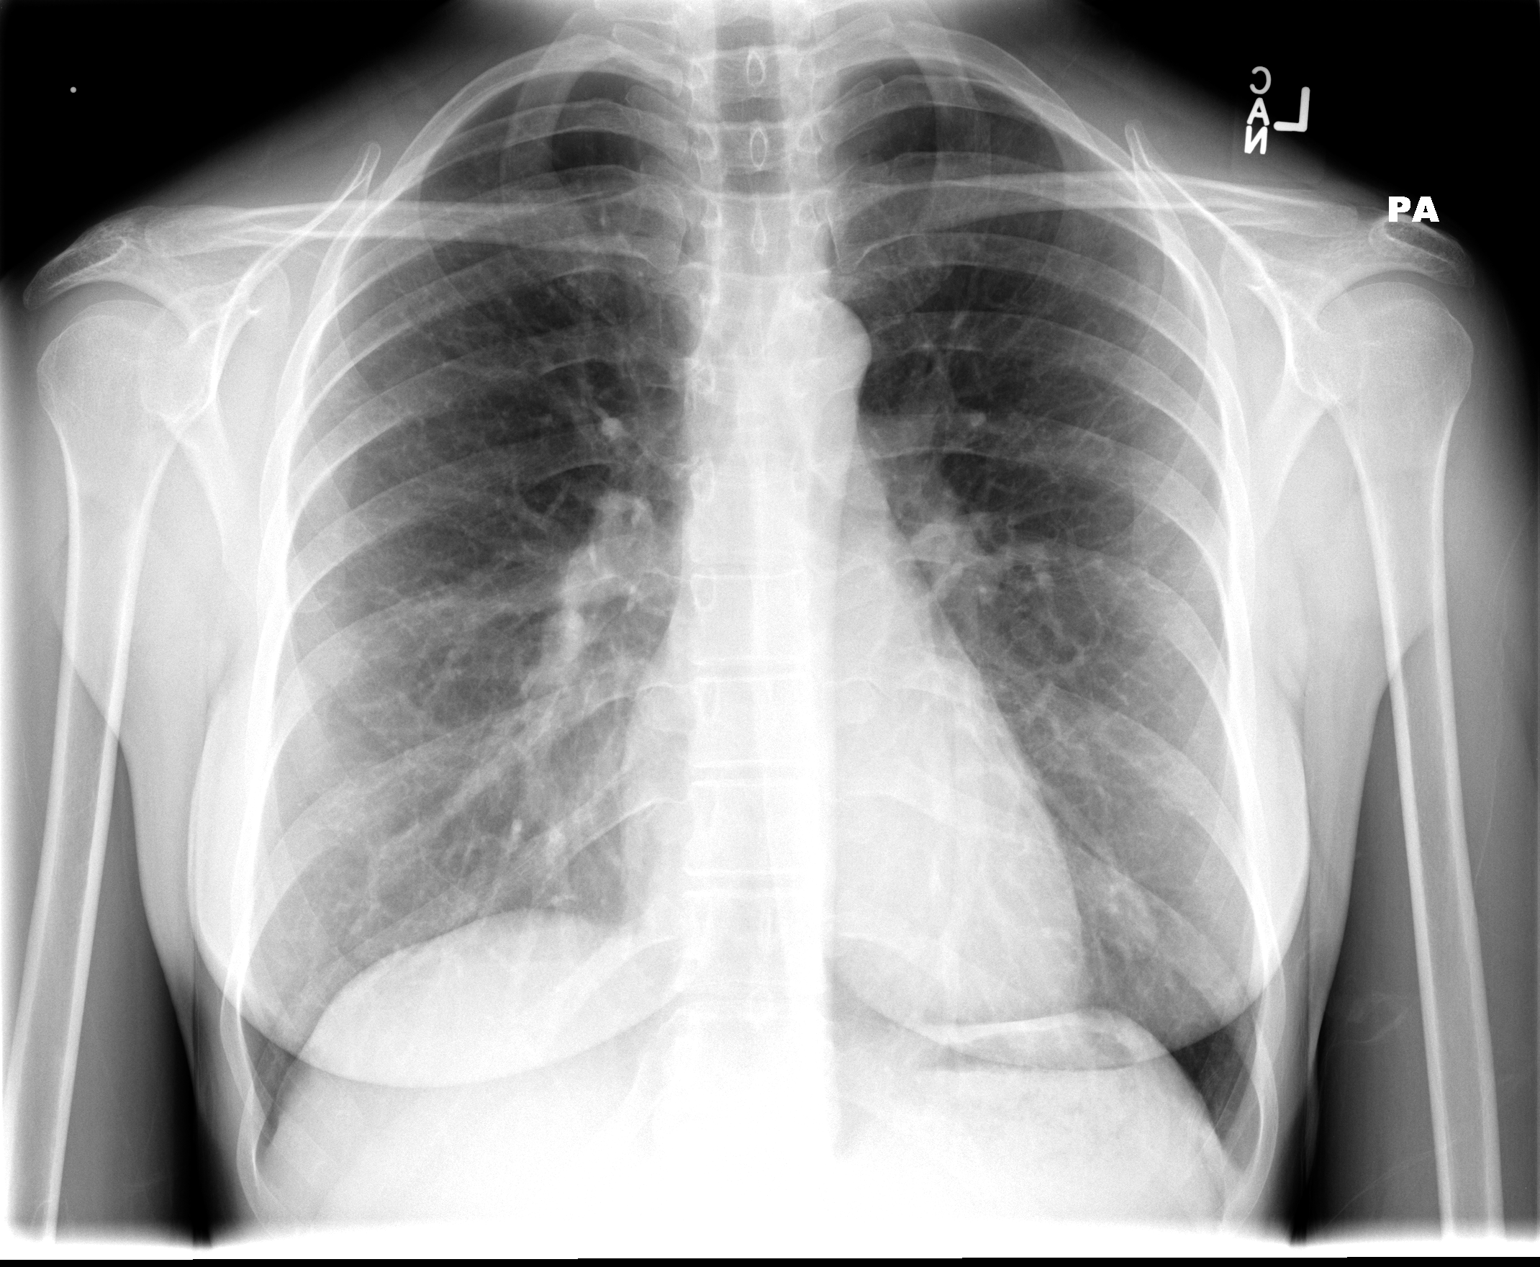

[view not recorded (2 of 2)]
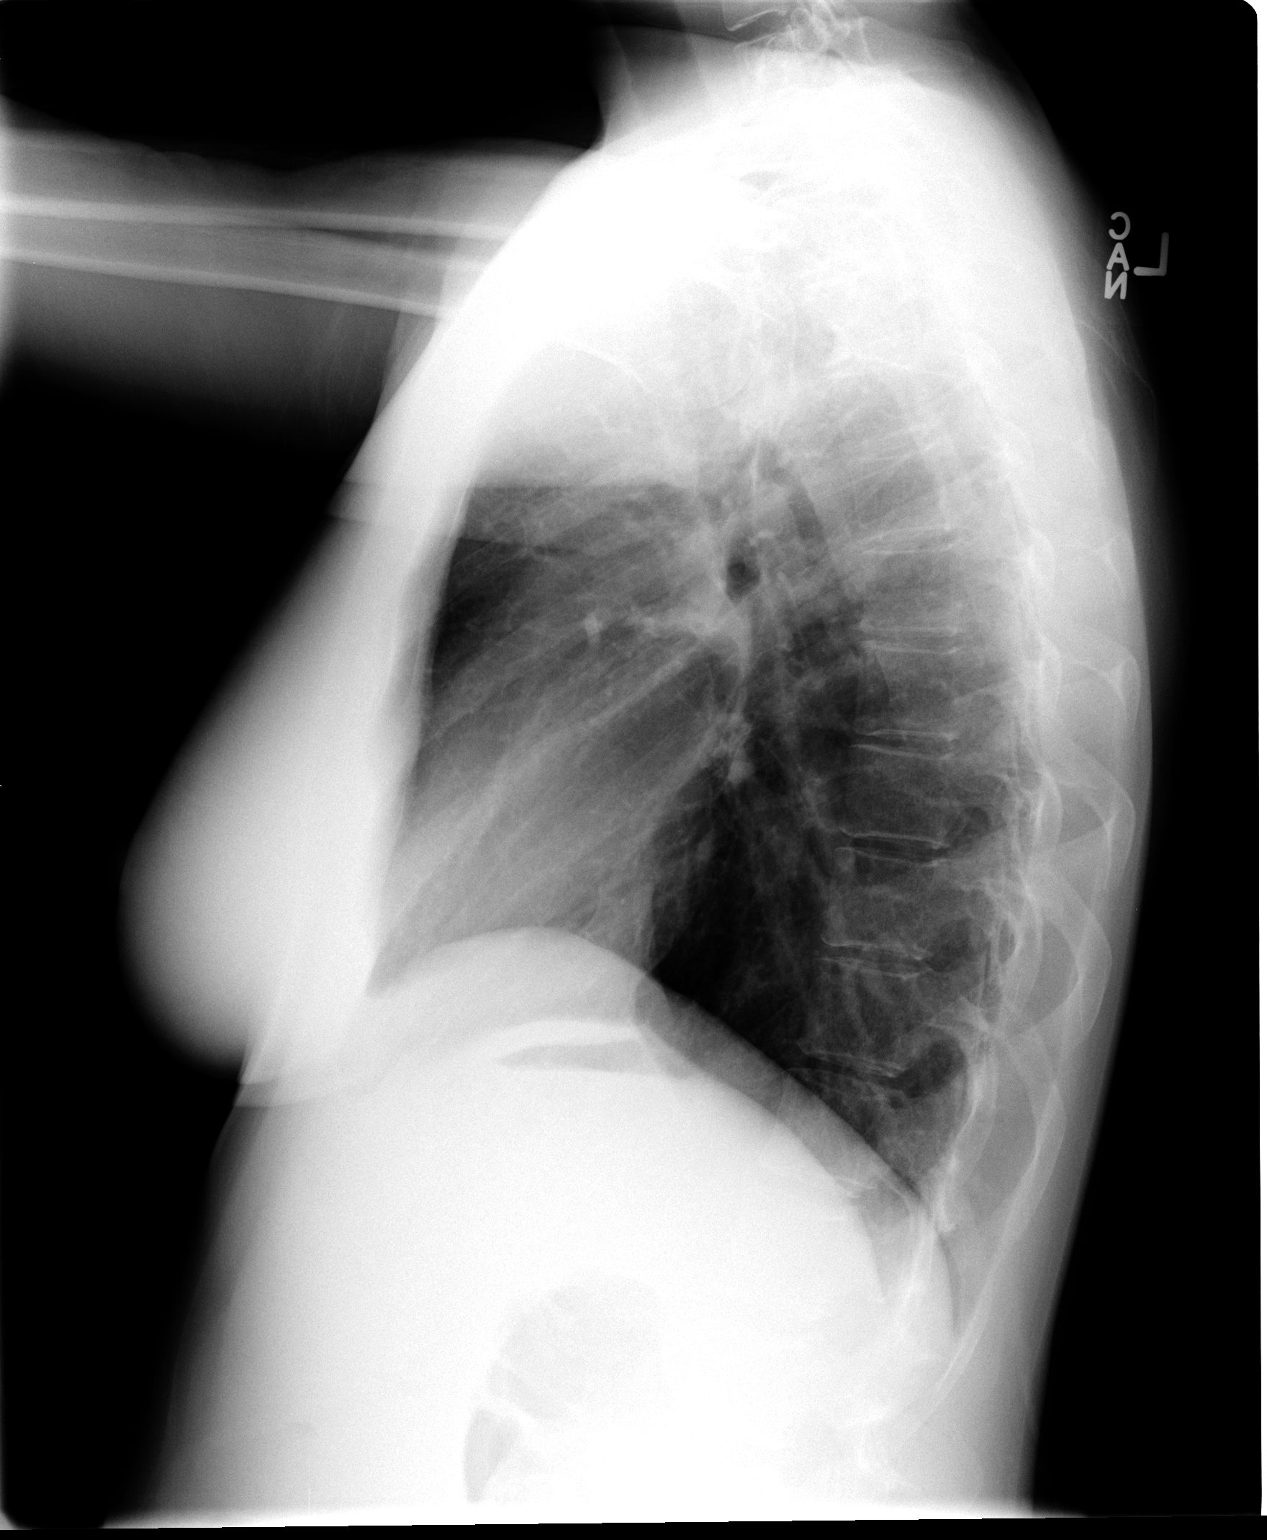

[2 of 2 positions shown; findings below may reference images not displayed]

FINDINGS: Cardiac and mediastinal contours appear normal.

The lungs appear clear.

No pleural effusion is identified.
IMPRESSION: No significant abnormality identified.

## 2012-10-08 MED ORDER — ACETAMINOPHEN 325 MG PO TABS: 650.0000 mg | ORAL_TABLET | Freq: Four times a day (QID) | ORAL | Status: DC | PRN

## 2012-10-08 MED ORDER — ACETAMINOPHEN 325 MG PO TABS
ORAL_TABLET | ORAL | Status: AC
  Filled 2012-10-08: qty 2

## 2012-10-08 NOTE — ED Notes (Signed)
Pt c/o chills. HA. Nasal pressure. Fever since Tuesday off/on. Runny nose. Night sweats. And fatigue.   Pt states that she has been caring for child with recent dx of RSV.  Pt denies n/v/d  Pt has only been using ibuprofen and rest for relief. Pt is currently breast feeding.

## 2012-10-08 NOTE — ED Provider Notes (Signed)
History     CSN: [id number]  Arrival date & time 10/08/12  1525   None     Chief Complaint  Patient presents with  . Influenza    HA. chills . fever. nasal pressure. night sweats. runny nose. fatigue.     (Consider location/radiation/quality/duration/timing/severity/associated sxs/prior treatment) Patient is a 37 y.o. female presenting with fever. The history is provided by the patient.  Fever Primary symptoms of the febrile illness include fever, fatigue, headaches, cough, nausea, vomiting, diarrhea and myalgias. Primary symptoms do not include wheezing, shortness of breath, abdominal pain or dysuria. The current episode started 3 to 5 days ago. This is a new problem. The problem has not changed since onset. Risk factors: recent exposure to RSV in her infant who is breast feeding.   Past Medical History  Diagnosis Date  . No pertinent past medical history   . Depression   . Preterm labor   . Hypertension   . H/O varicella   . Abnormal Pap smear 2002    Past Surgical History  Procedure Date  . Foot surgery   . Wisdom tooth extraction   . Cesarean section 11/14/2011    Procedure: CESAREAN SECTION;  Surgeon: Janine Limbo, MD;  Location: [facility];  Service: Gynecology;  Laterality: N/A;  Primary cesarean section with delivery of baby boy at [phone]. Apgars 9/9.    Family History  Problem Relation Age of Onset  . Anesthesia problems Neg Hx   . Cancer Father   . Hypertension Father   . Kidney disease Father   . Depression Father   . Cancer Maternal Grandmother   . Heart disease Maternal Grandfather   . Cancer Paternal Grandmother   . Stroke Paternal Grandfather   . Hypertension Brother     History  Substance Use Topics  . Smoking status: Never Smoker   . Smokeless tobacco: Never Used  . Alcohol Use: No    OB History    Grav Para Term Preterm Abortions TAB SAB Ect Mult Living   2 1 1  1 1    1       Review of Systems  Constitutional: Positive for fever,  appetite change and fatigue.  HENT: Positive for congestion, rhinorrhea and sinus pressure.   Respiratory: Positive for cough. Negative for shortness of breath and wheezing.   Gastrointestinal: Positive for nausea, vomiting and diarrhea. Negative for abdominal pain.  Genitourinary: Negative for dysuria.  Musculoskeletal: Positive for myalgias.  Neurological: Positive for headaches.    Allergies  Review of patient's allergies indicates no known allergies.  Home Medications   Current Outpatient Rx  Name  Route  Sig  Dispense  Refill  . CAMPHOR-MENTHOL 0.5-0.5 % EX LOTN   Topical   Apply topically as needed for itching.   222 mL   0   . CLINDAMYCIN HCL 300 MG PO CAPS      One by mouth every 12 hrs times 7 days   14 capsule   0   . METRONIDAZOLE 0.75 % VA GEL      One applicator at bedtime times five days   70 g   0   . NIFEDIPINE ER 30 MG PO TB24   Oral   Take 1 tablet (30 mg total) by mouth daily.   30 tablet   1   . PRENATAL MULTIVITAMIN CH   Oral   Take 1 tablet by mouth daily.   30 tablet   11     BP 128/87  Pulse 101  Temp 102.5 F (39.2 C) (Oral)  Resp 20  SpO2 98%  Breastfeeding? Yes  Physical Exam  Nursing note and vitals reviewed. Constitutional: She is oriented to person, place, and time. She appears well-developed and well-nourished.  HENT:  Head: Normocephalic.  Right Ear: Tympanic membrane, external ear and ear canal normal.  Left Ear: Tympanic membrane, external ear and ear canal normal.  Mouth/Throat: Oropharynx is clear and moist.  Eyes: Conjunctivae normal and EOM are normal. Pupils are equal, round, and reactive to light.  Neck: Normal range of motion. Neck supple.  Cardiovascular: Normal rate, regular rhythm, normal heart sounds and intact distal pulses.   Pulmonary/Chest: Effort normal and breath sounds normal.  Abdominal: Soft. Bowel sounds are normal.  Lymphadenopathy:    She has cervical adenopathy.  Neurological: She is alert  and oriented to person, place, and time.  Skin: Skin is warm and dry.    ED Course  Procedures (including critical care time)  Labs Reviewed - No data to display Dg Chest 2 View  10/08/2012  *RADIOLOGY REPORT*  Clinical Data: Chills.  Headache.  Nasal pressure.  Fever.  Night sweats. Fatigue. Cough.  CHEST - 2 VIEW  Comparison: None.  Findings: Cardiac and mediastinal contours appear normal.  The lungs appear clear.  No pleural effusion is identified.  IMPRESSION:  No significant abnormality identified.   Original Report Authenticated By: Gaylyn Rong, M.D.      1. Influenza-like illness       MDM  X-rays reviewed and report per radiologist.         [name], MD 10/08/12 Rickey Primus

## 2012-10-17 ENCOUNTER — Ambulatory Visit: Payer: PRIVATE HEALTH INSURANCE | Admitting: Obstetrics and Gynecology

## 2012-10-17 ENCOUNTER — Other Ambulatory Visit: Payer: Self-pay

## 2012-10-17 ENCOUNTER — Encounter: Payer: Self-pay | Admitting: Obstetrics and Gynecology

## 2012-10-17 VITALS — BP 112/70 | HR 92 | Ht 66.0 in | Wt 128.0 lb

## 2012-10-17 DIAGNOSIS — Z124 Encounter for screening for malignant neoplasm of cervix: Secondary | ICD-10-CM

## 2012-10-17 DIAGNOSIS — N63 Unspecified lump in unspecified breast: Secondary | ICD-10-CM

## 2012-10-17 NOTE — Progress Notes (Signed)
Last Pap: 10/2010 WNL: Yes Regular Periods:no Contraception: none   Monthly Breast exam:no Tetanus<83yrs:yes Nl.Bladder Function:yes Daily BMs:yes Healthy Diet:yes Calcium:no Mammogram:no Date of Mammogram: n/a Exercise:no Have often Exercise: n/a Seatbelt: yes Abuse at home: no Stressful work:no Sigmoid-colonoscopy: n/a Bone Density: No PCP: n/a Pt would like referral  Change in PMH: no changes  Change in [facility]: no changes  BP 112/70  Pulse 92  Ht 5\' 6"  (1.676 m)  Wt 128 lb (58.06 kg)  BMI 20.66 kg/m2  Breastfeeding? Yes Pt with complaints:pt with hemorrhoids, but no bleeding or pain.  She feels a new lump in left breast.  She is BF.   Physical Examination: General appearance - alert, well appearing, and in no distress Mental status - normal mood, behavior, speech, dress, motor activity, and thought processes Neck - supple, no significant adenopathy,  thyroid exam: thyroid is normal in size without nodules or tenderness Chest - clear to auscultation, no wheezes, rales or rhonchi, symmetric air entry Heart - normal rate and regular rhythm Abdomen - soft, nontender, nondistended, no masses or organomegaly Breasts - breasts appear normal, no suspicious masses, no skin or nipple changes or axillary nodes Pelvic - normal external genitalia, vulva, vagina, cervix, uterus and adnexa Rectal - rectal exam not indicated Back exam - full range of motion, no tenderness, palpable spasm or pain on motion Neurological - alert, oriented, normal speech, no focal findings or movement disorder noted Musculoskeletal - no joint tenderness, deformity or swelling Extremities - no edema, redness or tenderness in the calves or thighs Skin - normal coloration and turgor, no rashes, no suspicious skin lesions noted Routine exam Pap sent yes Mammogram due no.  Will do [name] of left breast at 12 o clock.  No mass palpated but pt palpates a mass condoms used for contraception RT 1 yr

## 2012-10-18 LAB — PAP IG W/ RFLX HPV ASCU

## 2012-10-19 ENCOUNTER — Other Ambulatory Visit: Payer: Self-pay

## 2012-10-19 DIAGNOSIS — N6320 Unspecified lump in the left breast, unspecified quadrant: Secondary | ICD-10-CM

## 2012-11-01 ENCOUNTER — Ambulatory Visit
Admission: RE | Admit: 2012-11-01 | Discharge: 2012-11-01 | Disposition: A | Discharge status: Home / Self Care | Payer: PRIVATE HEALTH INSURANCE | Source: Ambulatory Visit | Attending: Obstetrics and Gynecology | Admitting: Obstetrics and Gynecology

## 2012-11-01 DIAGNOSIS — N6320 Unspecified lump in the left breast, unspecified quadrant: Secondary | ICD-10-CM

## 2012-11-01 DIAGNOSIS — N63 Unspecified lump in unspecified breast: Secondary | ICD-10-CM

## 2013-01-21 NOTE — Progress Notes (Signed)
Patient ID: Ebony Lamb, female   DOB: 02-05-1976, 37 y.o.   MRN: [id number]

## 2013-05-09 ENCOUNTER — Ambulatory Visit (INDEPENDENT_AMBULATORY_CARE_PROVIDER_SITE_OTHER): Payer: PRIVATE HEALTH INSURANCE | Admitting: Family Medicine

## 2013-05-09 ENCOUNTER — Encounter: Payer: Self-pay | Admitting: Family Medicine

## 2013-05-09 VITALS — BP 121/84 | HR 71 | Ht 67.0 in | Wt 125.0 lb

## 2013-05-09 DIAGNOSIS — M76829 Posterior tibial tendinitis, unspecified leg: Secondary | ICD-10-CM

## 2013-05-09 NOTE — Patient Instructions (Addendum)
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 Do home exercises daily (calf raises, theraband exercises) 3 sets of 10 once day for next 6 weeks. Icing 15 minutes at a time 3-4 times a day. Consider tylenol, ibuprofen as needed though these do get into the milk supply.  I'd focus on arch support, icing, exercises instead. Dr. Jari Sportsman active series insoles - wear regularly even when walking for next 2 weeks. Cross train with swimming, cycling in meantime.  I'd recommend taking 2 weeks off of running then returning to your exercise program. Nothing bad will happen if you run through it but the pain can linger. Follow up with me in 1 month or as needed.

## 2013-05-11 ENCOUNTER — Encounter: Payer: Self-pay | Admitting: Family Medicine

## 2013-05-11 DIAGNOSIS — M76829 Posterior tibial tendinitis, unspecified leg: Secondary | ICD-10-CM | POA: Insufficient documentation

## 2013-05-11 NOTE — Progress Notes (Signed)
Patient ID: Ebony Lamb, female   DOB: [birth year], 37 y.o.   MRN: [id number]  PCP: Michael Litter, MD  Subjective:   HPI: Patient is a 37 y.o. female here for bilateral ankle pain.  Patient reports she runs 3 times a week for a total of 8-9 miles. Over past 2 weeks has started to get pain medial ankles at beginning of run that went away by end of run. Has progressed to bothering her more and more during the run though. Legs feel heavy the next day. Associated swelling. Last run was on Sunday. No prior h/o stress fracture. Does not use orthotics.  Past Medical History  Diagnosis Date  . No pertinent past medical history   . Depression   . Preterm labor   . Hypertension   . H/O varicella   . Abnormal Pap smear 2002    Current Outpatient Prescriptions on File Prior to Visit  Medication Sig Dispense Refill  . IBUPROFEN PO Take by mouth.      . Prenatal Vit-Fe Fumarate-FA (PRENATAL MULTIVITAMIN) TABS Take 1 tablet by mouth daily.  30 tablet  11  . [DISCONTINUED] ferrous sulfate (FERROUSUL) 325 (65 FE) MG tablet Take 1 tablet (325 mg total) by mouth 2 (two) times daily.  30 tablet  11   No current facility-administered medications on file prior to visit.    Past Surgical History  Procedure Laterality Date  . Foot surgery    . Wisdom tooth extraction    . Cesarean section  11/14/2011    Procedure: CESAREAN SECTION;  Surgeon: Janine Limbo, MD;  Location: [facility];  Service: Gynecology;  Laterality: N/A;  Primary cesarean section with delivery of baby boy at [phone]. Apgars 9/9.    No Known Allergies  History   Social History  . Marital Status: Married    Spouse Name: N/A    Number of Children: N/A  . Years of Education: N/A   Occupational History  . Not on file.   Social History Main Topics  . Smoking status: Never Smoker   . Smokeless tobacco: Never Used  . Alcohol Use: No  . Drug Use: No  . Sexual Activity: Yes    Birth Control/ Protection: Condom, Coitus  interruptus   Other Topics Concern  . Not on file   Social History Narrative  . No narrative on file    Family History  Problem Relation Age of Onset  . Anesthesia problems Neg Hx   . Diabetes Neg Hx   . Cancer Father   . Hypertension Father   . Kidney disease Father   . Depression Father   . Hyperlipidemia Father   . Cancer Maternal Grandmother   . Heart disease Maternal Grandfather   . Cancer Paternal Grandmother   . Stroke Paternal Grandfather   . Hypertension Brother   . Hyperlipidemia Brother     BP 121/84  Pulse 71  Ht 5\' 7"  (1.702 m)  Wt 125 lb (56.7 kg)  BMI 19.57 kg/m2  Review of Systems: See HPI above.    Objective:  Physical Exam:  Gen: NAD  Bilateral feet/ankles: Pes planus.  Mild medial ankle swelling post to lat malleoli.  No other deformity, bruising. FROM with mild pain int rotation and ext rotation (pain post tib area). TTP posterior tibialis tendons, less lateral malleoli.  No other TTP. Negative ant drawer and talar tilt.   Negative syndesmotic compression. Thompsons test negative. NV intact distally.  MSK u/s:  No evidence cortical irregularity, edema  overlying cortex, neovascularity over cortex of bilateral tibias, medial malleoli.  Target signs of post tib tendons.    Assessment & Plan:  1. 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  Arch support to correct overpronation.  Icing, tylenol.  Cross train in meantime.  Rest from running for about 2 weeks then return.  F/u in 1 month.  Consider PT if not improving, custom orthotics.

## 2013-05-11 NOTE — Assessment & Plan Note (Signed)
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  Arch support to correct overpronation.  Icing, tylenol.  Cross train in meantime.  Rest from running for about 2 weeks then return.  F/u in 1 month.  Consider PT if not improving, custom orthotics.

## 2013-07-24 LAB — OB RESULTS CONSOLE RUBELLA ANTIBODY, IGM: Rubella: IMMUNE

## 2013-07-24 LAB — OB RESULTS CONSOLE GC/CHLAMYDIA
CHLAMYDIA, DNA PROBE: NEGATIVE
Gonorrhea: NEGATIVE

## 2013-07-24 LAB — OB RESULTS CONSOLE HEPATITIS B SURFACE ANTIGEN: Hepatitis B Surface Ag: NEGATIVE

## 2013-07-24 LAB — OB RESULTS CONSOLE HIV ANTIBODY (ROUTINE TESTING): HIV: NONREACTIVE

## 2013-07-24 LAB — OB RESULTS CONSOLE RPR: RPR: NONREACTIVE

## 2013-07-24 LAB — OB RESULTS CONSOLE ABO/RH: RH TYPE: POSITIVE

## 2013-07-24 LAB — OB RESULTS CONSOLE ANTIBODY SCREEN: ANTIBODY SCREEN: NEGATIVE

## 2013-09-11 ENCOUNTER — Encounter (HOSPITAL_COMMUNITY): Payer: No Typology Code available for payment source

## 2013-09-12 ENCOUNTER — Other Ambulatory Visit: Payer: Self-pay

## 2013-09-18 ENCOUNTER — Ambulatory Visit (HOSPITAL_COMMUNITY)
Admission: RE | Admit: 2013-09-18 | Discharge: 2013-09-18 | Disposition: A | Discharge status: Home / Self Care | Payer: PRIVATE HEALTH INSURANCE | Source: Ambulatory Visit | Attending: Obstetrics and Gynecology | Admitting: Obstetrics and Gynecology

## 2013-09-20 ENCOUNTER — Other Ambulatory Visit: Payer: Self-pay | Admitting: Obstetrics and Gynecology

## 2013-09-20 DIAGNOSIS — O289 Unspecified abnormal findings on antenatal screening of mother: Secondary | ICD-10-CM

## 2013-09-20 DIAGNOSIS — Z3689 Encounter for other specified antenatal screening: Secondary | ICD-10-CM

## 2013-09-20 NOTE — Progress Notes (Signed)
Genetic Counseling  High-Risk Gestation Note  Appointment Date:  09/18/2013 Referred By: Donnel Saxon CNM Date of Birth:  [birth year]    Pregnancy History: G3P1011 Estimated Date of Delivery: 03/13/14 Estimated Gestational Age: [redacted]w[redacted]d Attending: Renella Cunas, MD   Mrs. Jaiyana Canale was seen for genetic counseling regarding a maternal age of 38 y.o. and an increased risk for Down syndrome based on first trimester screening performed through [facility].  She was counseled regarding maternal age and the association with risk for chromosome conditions due to nondisjunction with aging of the ova.  We reviewed chromosomes, nondisjunction, and the associated 1 in 111 risk for fetal aneuploidy related to a maternal age of 38 y.o..  She was counseled that the risk for aneuploidy decreases as gestational age increases, accounting for those pregnancies which spontaneously abort.  We specifically discussed Down syndrome (trisomy 9), trisomies 4 and 69, and sex chromosome aneuploidies (47,XXX and 47,XXY), including the common features and prognoses of each.   We reviewed Mrs. Stober' first trimester screen result and the associated increase in risk for fetal Down syndrome (1 in 184 to 1 in 30).  She was counseled regarding other explanations for a screen positive result including normal variation and differences in maternal metabolism.  In addition, we reviewed the screen adjusted reduction in risks for trisomy 18/13 (1 in 344 to 1 in 3,846).  She understands that first trimester screening provides a pregnancy specific risk for Down syndrome, but is not considered to be diagnostic.    Mrs. Wheeless previously pursued noninvasive prenatal screening (NIPS)/cell free DNA testing through her OB office. We reviewed the results of this screen, [facility] performed through [facility], which indicated a low risk (< 1 in 10,000) for trisomies 21, 18, and 13. Additionally, sex chromosome analysis indicated female gender  99%, with no evidence of sex chromosome aneuploidy (XX). We reviewed the methodology of this screening method. This screen reportedly detects >99% of pregnancies with Down syndrome, approximately >98% of pregnancies with trisomy 18, and approximately 80% of pregnancies with trisomy 45. She understands that this is not diagnostic for these conditions, nor does it screen for all chromosome or genetic conditions.  We discussed the first trimester screening reported an ultrasound finding of an increased nuchal translucency (NT) at 3.1 mm, which is at the 95%tile for that gestational age.  We discussed that the fetal NT refers to a fluid filled space between the skin and soft tissues behind the cervical spine.  This space is traditionally measurable between 11 and 13.[redacted] weeks gestation and is considered enlarged at ~3 mm or greater.  Ms. Dicicco was counseled regarding the various common etiologies for an enlarged NT including: aneuploidy, single gene conditions (such as Noonan syndrome), cardiac or great vessel abnormalities, lymphatic system failure, decreased fetal movement, and fetal anemia.    In addition to the association with fetal aneuploidy, which was previously discussed, we discussed that an NT of 3.1 mm is associated with an approximate 1% risk for a fetal cardiac anomaly. We discussed the screening option of fetal echocardiogram in the second trimester, given this association.  We also discussed single gene conditions and that these conditions are not routinely tested for prenatally unless ultrasound findings or family history significantly increase the suspicion of a specific single gene disorder.  We briefly reviewed common inheritance patterns (dominant, recessive, and X-linked) as well as the associated risks of recurrence.     She was then counseled regarding her additional options of 2nd trimester detailed ultrasound  and amniocentesis.   We reviewed the benefits, limitations, and risks of these  options. We discussed the associated 1 in 468-032 risk for complications with amniocentesis, including spontaneous pregnancy loss. She understands that amniocentesis also does not assess for all genetic conditions. We discussed that single gene conditions are typically not tested for prenatally unless targeted ultrasound or family history indicate a strong suspicion.  After thoughtful consideration, she expressed interest in a detailed ultrasound at ~18 weeks and fetal echocardiogram.  We are happy to perform detailed ultrasound in our office, if desired. No additional appointments were made at this time. Please contact our office if you would like [name] to schedule.  She declined amniocentesis at this time, but stated that she would possibly consider this option pending results of ultrasound.  She was counseled that the fetal prognosis depends on the underlying etiology of the enlarged NT and further anticipatory guidance can be provided if a diagnosis is discovered.   Both family histories were reviewed and found to be contributory for hypospadias in the couple's son. Hypospadias describes a condition present at birth where the female urethra opens on the underside of the penis. This is not an uncommon difference, occurring in approximately 1 in 1,000 males.  Hypospadias occurs due to an error in embryological development.  Although it is most often an isolated condition, it can be present in combination with other birth defects as part of a genetic syndrome.  When there is no syndrome as the cause, then the hypospadias is thought to be caused by a combination of genetic and environmental factors, called multifactorial inheritance. The recurrence risk for hypospadias in a female sibling is estimated to be 10%, in the case of multifactorial inheritance.  Without further information regarding the provided family history, an accurate genetic risk cannot be calculated. Further genetic counseling is warranted if more  information is obtained.  Mrs. Brae Schaafsma denied exposure to environmental toxins or chemical agents. She denied the use of alcohol, tobacco or street drugs. She denied significant viral illnesses during the course of her pregnancy. She reported having a cold on New Year's, for which she took tylenol. Her medical and surgical histories were noncontributory.      I counseled Mrs. Jerney Baksh regarding the above risks and available options. The approximate face-to-face time with the genetic counselor was 45 minutes.    Chipper Oman, MS,  Certified Genetic Counselor 09/20/2013

## 2013-10-12 ENCOUNTER — Other Ambulatory Visit: Payer: Self-pay

## 2013-10-12 ENCOUNTER — Ambulatory Visit (HOSPITAL_COMMUNITY)
Admission: RE | Admit: 2013-10-12 | Discharge: 2013-10-12 | Disposition: A | Discharge status: Home / Self Care | Payer: PRIVATE HEALTH INSURANCE | Source: Ambulatory Visit

## 2013-10-12 ENCOUNTER — Ambulatory Visit (HOSPITAL_COMMUNITY)
Admission: RE | Admit: 2013-10-12 | Discharge: 2013-10-12 | Disposition: A | Discharge status: Home / Self Care | Payer: PRIVATE HEALTH INSURANCE | Source: Ambulatory Visit | Attending: Obstetrics and Gynecology | Admitting: Obstetrics and Gynecology

## 2013-10-12 ENCOUNTER — Encounter (HOSPITAL_COMMUNITY): Payer: Self-pay

## 2013-10-12 ENCOUNTER — Other Ambulatory Visit: Payer: Self-pay | Admitting: Obstetrics and Gynecology

## 2013-10-12 DIAGNOSIS — O358XX0 Maternal care for other (suspected) fetal abnormality and damage, not applicable or unspecified: Secondary | ICD-10-CM | POA: Insufficient documentation

## 2013-10-12 DIAGNOSIS — Z3689 Encounter for other specified antenatal screening: Secondary | ICD-10-CM

## 2013-10-12 DIAGNOSIS — O289 Unspecified abnormal findings on antenatal screening of mother: Secondary | ICD-10-CM

## 2013-10-12 DIAGNOSIS — Z1389 Encounter for screening for other disorder: Secondary | ICD-10-CM | POA: Insufficient documentation

## 2013-10-12 DIAGNOSIS — O09529 Supervision of elderly multigravida, unspecified trimester: Secondary | ICD-10-CM | POA: Insufficient documentation

## 2013-10-12 DIAGNOSIS — Z363 Encounter for antenatal screening for malformations: Secondary | ICD-10-CM | POA: Insufficient documentation

## 2013-10-12 LAB — CHROMOSOMES ANALYSIS FOR CF

## 2013-10-12 LAB — ROUTINE CHROMOSOME - KARYOTYPE + FISH

## 2013-10-12 IMAGING — US US OB DETAIL+14 WK
1 series · 16 of 28 positions shown · non-contrast
Comparison: none

[Series 1: us ob detail+14 wk · 0.23mm/px · 16 of 111 slices shown]
[im 1/111]
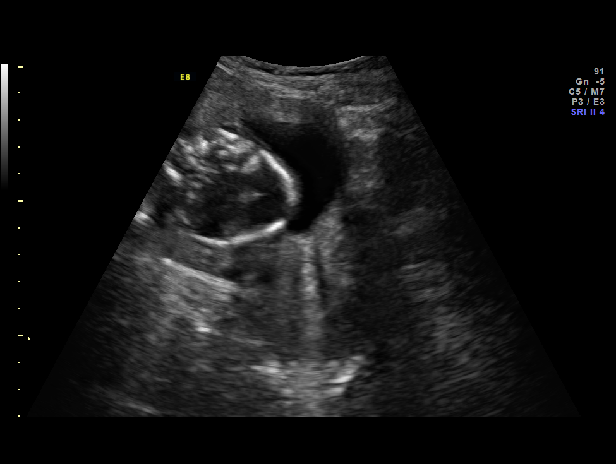
[im 9/111]
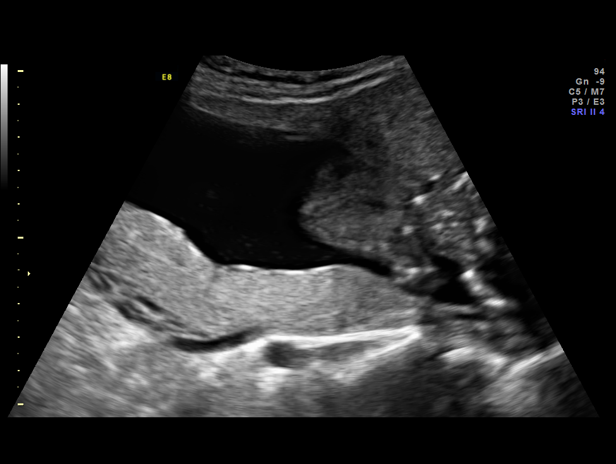
[im 17/111]
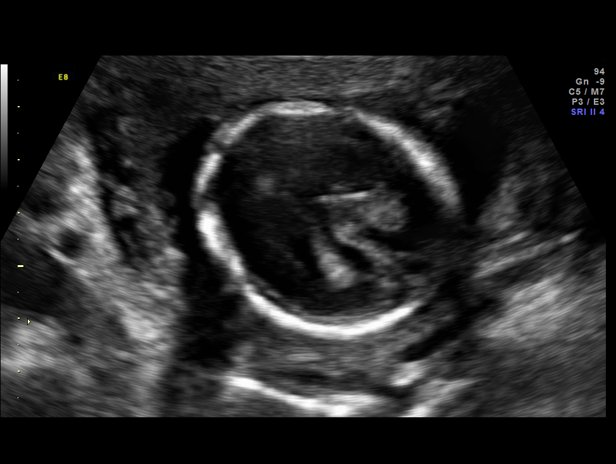
[im 25/111]
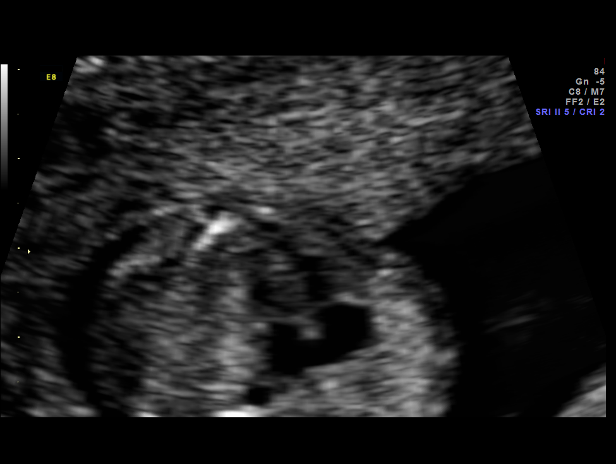
[im 29/111]
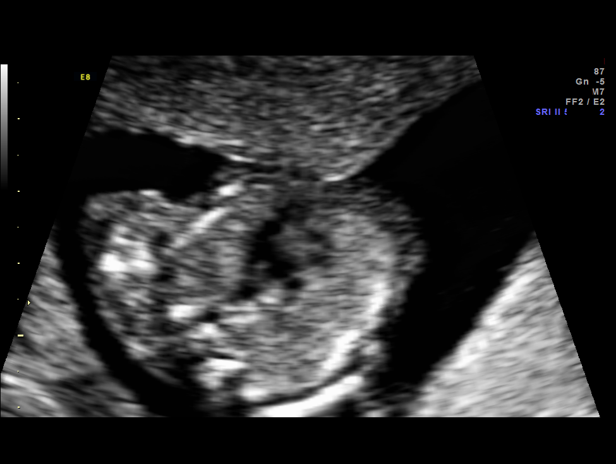
[im 37/111]
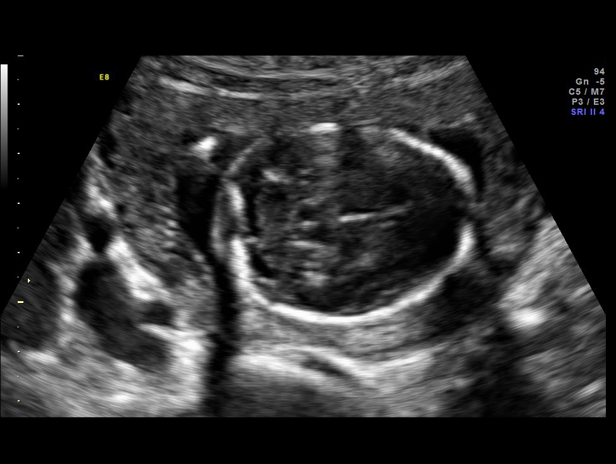
[im 45/111]
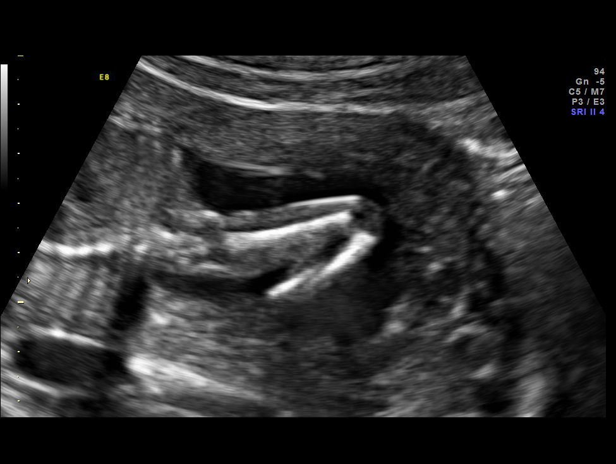
[im 53/111]
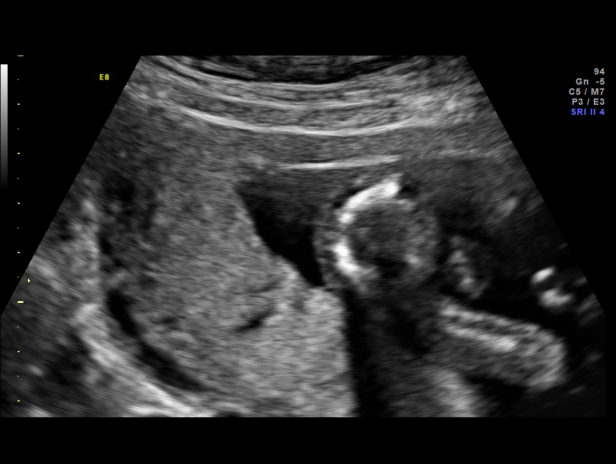
[im 58/111]
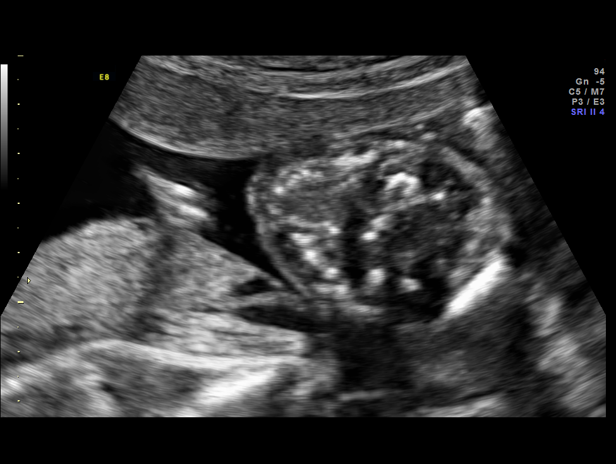
[im 66/111]
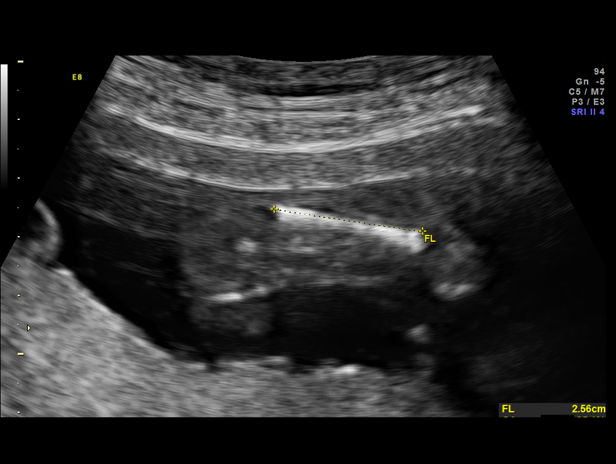
[im 74/111]
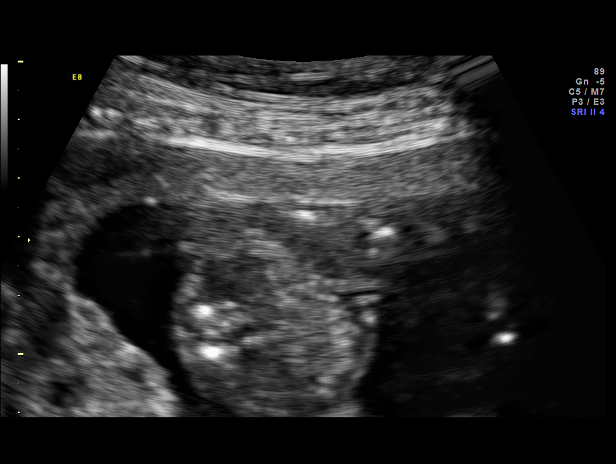
[im 82/111]
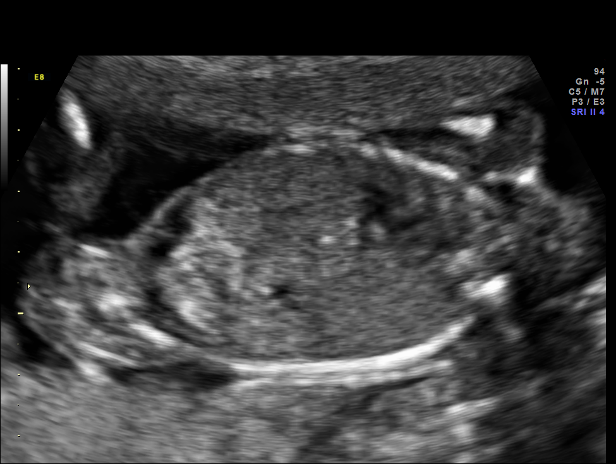
[im 86/111]
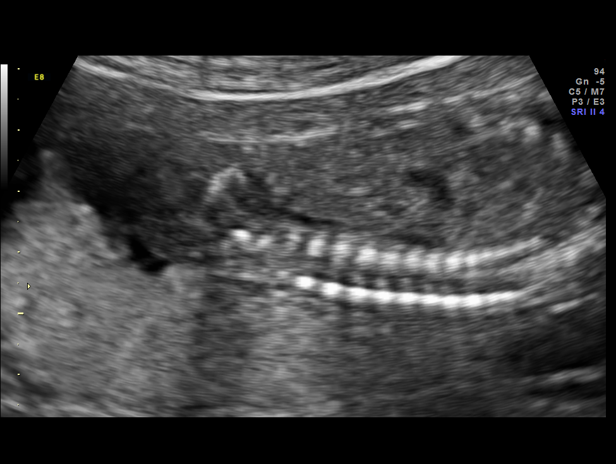
[im 94/111]
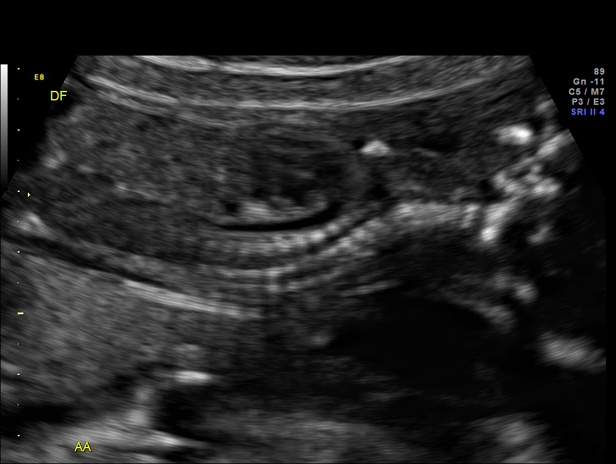
[im 102/111]
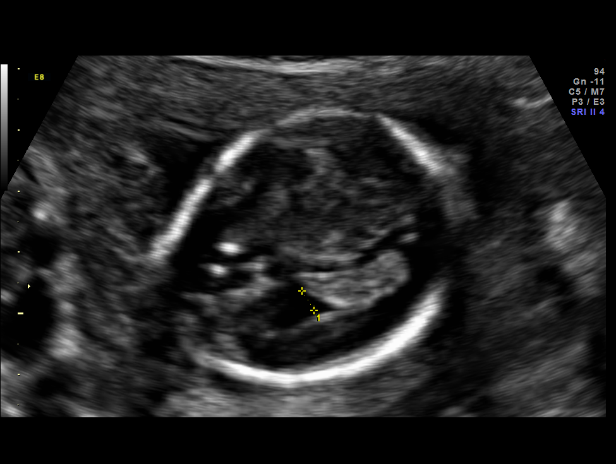
[im 111/111]
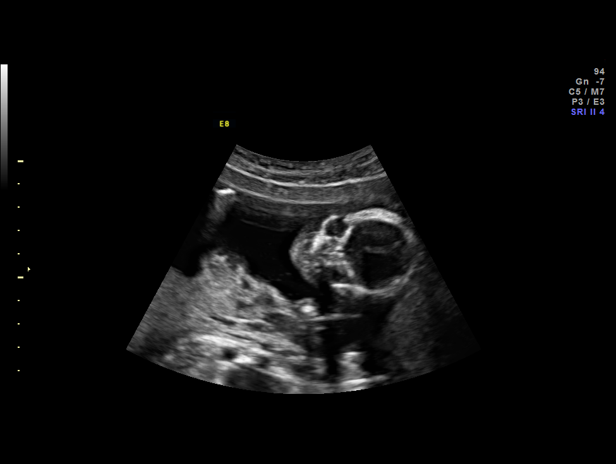

[16 of 28 positions shown; findings below may reference images not displayed]

OBSTETRICS REPORT
                      (Signed Final [DATE] [DATE])

Service(s) Provided

 US OB DETAIL + 14 WK                                  76811.0
Indications

 Detailed fetal anatomic survey                        655.83 [4X]
 Advanced maternal age (AMA), Multigravida
 Abnormal first trimester screen (NT)
Fetal Evaluation

 Num Of Fetuses:    1
 Fetal Heart Rate:  152                          bpm
 Cardiac Activity:  Observed
 Presentation:      Cephalic
 Placenta:          Posterior, above cervical
                    os
 P. Cord            Visualized
 Insertion:

 Amniotic Fluid
 AFI FV:      Subjectively within normal limits
                                             Larg Pckt:     5.1  cm
Biometry

 BPD:     38.3  mm     G. Age:  17w 5d                CI:         70.0   70 - 86
 OFD:     54.7  mm                                    FL/HC:      17.2   15.8 -
                                                                         18
 HC:     150.7  mm     G. Age:  18w 1d       34  %    HC/AC:      1.20   1.07 -

 AC:     125.2  mm     G. Age:  18w 1d       42  %    FL/BPD:
 FL:      25.9  mm     G. Age:  17w 6d       29  %    FL/AC:      20.7   20 - 24
 HUM:       27  mm     G. Age:  18w 4d       63  %
 CER:     18.5  mm     G. Age:  18w 2d       48  %
 NFT:      3.4  mm

 Est. FW:     220  gm      0 lb 8 oz     41  %
Gestational Age

 LMP:           18w 2d        Date:  [DATE]                 EDD:   [DATE]
 U/S Today:     18w 0d                                        EDD:   [DATE]
 Best:          18w 2d     Det. By:  LMP  ([DATE])          EDD:   [DATE]
Anatomy

 Cranium:          Appears normal         LVOT:             Appears normal
 Fetal Cavum:      Appears normal         Aortic Arch:      Appears normal
 Ventricles:       Appears normal         Ductal Arch:      Appears normal
 Choroid Plexus:   Appears normal         Diaphragm:        Appears normal
 Cerebellum:       Appears normal         Stomach:          Appears normal, left
                                                            sided
 Posterior Fossa:  Appears normal         Abdomen:          Appears normal
 Nuchal Fold:      Appears normal         Abdominal Wall:   Appears nml (cord
                                                            insert, abd wall)
 Face:             Appears normal         Cord Vessels:     Appears normal (3
                   (orbits and profile)                     vessel cord)
 Lips:             Appears normal         Kidneys:          Appear normal
 Palate:           Appears normal         Bladder:          Appears normal
 Heart:            Appears normal         Lower             Appears normal
                   (4CH, axis, and        Extremities:
                   situs)
 RVOT:             Appears normal         Upper             Appears normal
                                          Extremities:

 Other:  Fetus appears to be a female. Heels and 5th digit appear normal.
Targeted Anatomy

 Fetal Central Nervous System
 Cisterna Magna:
Guided Procedures

 Type:   Amniocentesis

 FH Post Procedure:     Normal             RH Type:          O+
 Rh Immune Globulin:    Not required,      Discharge Inst.:  Post-procedure
                        Rh positive                          instructions
                                                             given
 Needle Insertions:     22 gauge x 1       Vol. Withdrawn:   20 ml of clear
                                                             amniotic fluid

 Complications:  None

 Comment:                    Amniocentesis performed after obtaining informed consent and performing  a "time-out".
Cervix Uterus Adnexa

 Cervical Length:    3.9      cm

 Cervix:       Normal appearance by transabdominal scan. Appears
               closed, without funnelling.
 Left Ovary:    Within normal limits.
 Right Ovary:   Within normal limits.
Comments

 Ms. BRDAR fetal anatomic survey is now complete.  Today's
 ultrasound identified echogenic bowel and a hypoplastic
 nasal bone.  No other fetal anomalies or soft markers of
 aneuploidy were seen.  Results of scan discussed with
 patient, as well as the limitations of U/S to detect all fetal
 anomalies and aneuploidies. The patient was also seen for
 an BRDAR evaluation to discuss these results and additional
 testing options.  Please see accompanying documentation for
 further details.

 The risks, benefits, and alternatives of amniocentesis were
 discussed with Ms. BRDAR including a 1 in 300 risk of
 pregnancy loss.  She provided written and verbal consent to
 the procedure.  An amniocentesis was performed without
 complication as described above.
Impression

 Single living intrauterine pregnancy at 18 weeks 2 days.
 Appropriate fetal growth (41%).
 Normal amniotic fluid volume.
 Echogenic bowel.
 Hypoplastic nasal bone.
 No other fetal anomalies or soft markers of aneuploidy seen.
Recommendations

 Follow up will be dictated by amniocentesis results.

 questions or concerns.
                BRDAR

## 2013-10-12 NOTE — Progress Notes (Signed)
MATERNAL FETAL MEDICINE CONSULT  Patient Name: Ebony Lamb Record Number:  [id number] Date of Birth: 1976-01-30 Requesting Physician Name:  Donnel Saxon, CNM Date of Service: 10/12/2013  Chief Complaint Abnormal first trimester screen  History of Present Illness Ebony Lamb was seen today secondary to an abnormal first trimester screen at the request of Donnel Saxon, CNM.  The patient is a 38 y.o. G3P1011,at 52w2dwith an EDD of 03/13/2014, by Last Menstrual Period dating method.  Ms. RCissellhad a first trimester screen performed which returned a Downs risk of 1 in 267  Review of the individual parameters revealed normal PAPP-A and HCG levels, but an NT of 3.1 mm which is greater than 95% for the CRL at that time.  [facility] and declined invasive testing preferring to have cell-free fetal DNA testing an ultrasound at 18 weeks.  Her cell-free fetal DNA testing ([facility]) demonstrated a low risk of Trisomy 249 125 137 and sex chromosome aneuploidy.  She returns today for her detailed fetal anatomic survey, which demonstrated echogenic bowel, and a hypoplastic nasal bone.  The remainder of the fetal anatomy was well seen and appeared normal.  Review of Systems Pertinent items are noted in HPI.  Patient History OB History  Gravida Para Term Preterm AB SAB TAB Ectopic Multiple Living  '3 1 1  1  1   1    ' # Outcome Date GA Lbr Len/2nd Weight Sex Delivery Anes PTL Lv  3 CUR           2 TRM 11/14/11 461w0d0:35 / 07:29 9 lb 6.1 oz (4.255 kg) M LVCS Spinal  Y     Comments: glandular hypospadius  1 TAB     U    N      Past Medical History  Diagnosis Date  . No pertinent past medical history   . Depression   . Preterm labor   . Hypertension   . H/O varicella   . Abnormal Pap smear 2002    Past Surgical History  Procedure Laterality Date  . Foot surgery    . Wisdom tooth extraction    . Cesarean section  11/14/2011    Procedure:  CESAREAN SECTION;  Surgeon: ArEli HoseMD;  [facility];  Service: Gynecology;  Laterality: N/A;  Primary cesarean section with delivery of baby boy at [phone] 9/9.    History   Social History  . Marital Status: Married    Spouse Name: N/A    Number of Children: N/A  . Years of Education: N/A   Social History Main Topics  . Smoking status: Never Smoker   . Smokeless tobacco: Never Used  . Alcohol Use: No  . Drug Use: No  . Sexual Activity: Yes    Birth Control/ Protection: Condom, Coitus interruptus   Other Topics Concern  . Not on file   Social History Narrative  . No narrative on file    Family History  Problem Relation Age of Onset  . Anesthesia problems Neg Hx   . Diabetes Neg Hx   . Cancer Father   . Hypertension Father   . Kidney disease Father   . Depression Father   . Hyperlipidemia Father   . Cancer Maternal Grandmother   . Heart disease Maternal Grandfather   . Cancer Paternal Grandmother   . Stroke Paternal Grandfather   . Hypertension Brother   . Hyperlipidemia Brother  In addition, the patient has no family history of mental retardation, birth defects, or genetic diseases.  Assessment and Recommendations 1.  Aneuploidy risk.  Ebony Lamb has now had several screening tests which are providing conflicting information.  The first trimester screen and the soft markers found to today's ultrasound indicated an increased risk of aneuploidy.  However, the cell-free fetal DNA testing suggests a low risk.  For Trisomy 21 cell-free fetal DNA is very specific, so overall while the risk is elevated by the presence of the soft markers the absolute risk of Trisomy 21 is likely still very low.  However, the cell-free fetal DNA testing performs less well for sex chromosome aneuploidy particularly for Turner's syndrome due to X chromosome inactivation and potential low level maternal mosaicism for Turner's.  I discussed these confusing test results and their  possible implication in detail with Ebony Lamb and her husband.  We also discuss proceeding with amniocentesis as a way to clarify the fetal karyotype.  I reviewed the risks and benefits of that procedure and obtained written and verbal consent from Ms. Surges.  The amniocentesis was performed without complication today. 2.  Echogenic bowel.  Although echogenic bowel is most commonly seen in association with aneuploidy, it is also seen with fetal cystic fibrosis and congenital TORCH infection.  These are less likely as a diagnosis in Ebony Lamb's case as she has no family history of aneuploidy and report no recent febrile illnesses.  However, we did perform cystic fibrosis carrier screening on both Ebony Lamb and her husband today.  As we already obtained amniotic fluid for the genetic testing described above, we also sent fluid for TORCH infection PCR.   I spent 30 minutes with Ebony Lamb today of which 50% was face-to-face counseling.  Thank you for referring Ms. Guimont to the [facility].  Please do not hesitate to contact us with questions.   Jolyn Lent, MD

## 2013-10-19 ENCOUNTER — Telehealth (HOSPITAL_COMMUNITY): Payer: Self-pay | Admitting: *Deleted

## 2013-10-19 NOTE — Telephone Encounter (Signed)
Ebony Lamb called wanting to speak to someone about a "feeling" she has been having today.  States she was seen at her OB office yesterday for leaking and was found to have a yeast infection.  She states that today she had felt like she may feel some fluid around where her amnio was performed last week on the 30th. She states she isn't sure what she is feeling or if she is just "being paranoid".  She denies any bleeding or cramping and has felt fetal movement.  I encouraged Ebony Lamb to call her OBGYN to let them know what she was feeling to see if they wanted to see her.  I also encouraged her to come to Maternity Admissions with any concerns she might have if she is unable to reach her OB, or if she is still unsure after speaking with them.  Pt verbalized understanding.

## 2014-02-25 ENCOUNTER — Telehealth (HOSPITAL_COMMUNITY): Payer: Self-pay | Admitting: *Deleted

## 2014-02-25 NOTE — Telephone Encounter (Signed)
Preadmission screen  

## 2014-02-26 ENCOUNTER — Inpatient Hospital Stay (HOSPITAL_COMMUNITY): Admission: RE | Admit: 2014-02-26 | Payer: PRIVATE HEALTH INSURANCE | Source: Ambulatory Visit

## 2014-03-13 ENCOUNTER — Encounter: Payer: Self-pay | Admitting: *Deleted

## 2014-03-21 ENCOUNTER — Other Ambulatory Visit: Payer: Self-pay | Admitting: Obstetrics and Gynecology

## 2014-03-21 ENCOUNTER — Encounter (HOSPITAL_COMMUNITY): Payer: Self-pay

## 2014-03-21 ENCOUNTER — Encounter (HOSPITAL_COMMUNITY): Payer: Self-pay | Admitting: Pharmacist

## 2014-03-21 ENCOUNTER — Encounter (HOSPITAL_COMMUNITY)
Admission: RE | Admit: 2014-03-21 | Discharge: 2014-03-21 | Disposition: A | Discharge status: Home / Self Care | Payer: No Typology Code available for payment source | Source: Ambulatory Visit | Attending: Obstetrics and Gynecology | Admitting: Obstetrics and Gynecology

## 2014-03-21 LAB — TYPE AND SCREEN
ABO/RH(D): O POS
Antibody Screen: NEGATIVE

## 2014-03-21 LAB — ABO/RH: ABO/RH(D): O POS

## 2014-03-21 LAB — CBC
HEMATOCRIT: 35.9 % — AB (ref 36.0–46.0)
Hemoglobin: 12.1 g/dL (ref 12.0–15.0)
MCH: 32.1 pg (ref 26.0–34.0)
MCHC: 33.7 g/dL (ref 30.0–36.0)
MCV: 95.2 fL (ref 78.0–100.0)
PLATELETS: 200 10*3/uL (ref 150–400)
RBC: 3.77 MIL/uL — ABNORMAL LOW (ref 3.87–5.11)
RDW: 13 % (ref 11.5–15.5)
WBC: 10.5 10*3/uL (ref 4.0–10.5)

## 2014-03-21 NOTE — Patient Instructions (Signed)
Your procedure is scheduled on:03/22/14  Enter through the Main Entrance at :0745 am Pick up desk phone and dial [phone] and inform us of your arrival.  Please call [phone] if you have any problems the morning of surgery.  Remember: Do not eat food or drink liquids, including water, after midnight:tonight   You may brush your teeth the morning of surgery.   DO NOT wear jewelry, eye make-up, lipstick,body lotion, or dark fingernail polish.  (Polished toes are ok) You may wear deodorant.  If you are to be admitted after surgery, leave suitcase in car until your room has been assigned.

## 2014-03-22 ENCOUNTER — Inpatient Hospital Stay (HOSPITAL_COMMUNITY): Payer: No Typology Code available for payment source | Admitting: Anesthesiology

## 2014-03-22 ENCOUNTER — Encounter (HOSPITAL_COMMUNITY)
Admission: AD | Disposition: A | Discharge status: Home / Self Care | Payer: Self-pay | Source: Ambulatory Visit | Attending: Obstetrics and Gynecology

## 2014-03-22 ENCOUNTER — Inpatient Hospital Stay (HOSPITAL_COMMUNITY)
Admission: AD | Admit: 2014-03-22 | Discharge: 2014-03-25 | DRG: 765 | Disposition: A | Discharge status: Home / Self Care | Payer: No Typology Code available for payment source | Source: Ambulatory Visit | Attending: Obstetrics and Gynecology | Admitting: Obstetrics and Gynecology

## 2014-03-22 ENCOUNTER — Encounter (HOSPITAL_COMMUNITY): Payer: No Typology Code available for payment source | Admitting: Anesthesiology

## 2014-03-22 ENCOUNTER — Encounter (HOSPITAL_COMMUNITY): Payer: Self-pay | Admitting: Anesthesiology

## 2014-03-22 DIAGNOSIS — O1002 Pre-existing essential hypertension complicating childbirth: Secondary | ICD-10-CM | POA: Diagnosis present

## 2014-03-22 DIAGNOSIS — O99344 Other mental disorders complicating childbirth: Secondary | ICD-10-CM | POA: Diagnosis present

## 2014-03-22 DIAGNOSIS — F3289 Other specified depressive episodes: Secondary | ICD-10-CM | POA: Diagnosis present

## 2014-03-22 DIAGNOSIS — Z8249 Family history of ischemic heart disease and other diseases of the circulatory system: Secondary | ICD-10-CM

## 2014-03-22 DIAGNOSIS — O34219 Maternal care for unspecified type scar from previous cesarean delivery: Principal | ICD-10-CM | POA: Diagnosis present

## 2014-03-22 DIAGNOSIS — F329 Major depressive disorder, single episode, unspecified: Secondary | ICD-10-CM | POA: Diagnosis present

## 2014-03-22 DIAGNOSIS — O480 Post-term pregnancy: Secondary | ICD-10-CM | POA: Diagnosis present

## 2014-03-22 DIAGNOSIS — O3660X0 Maternal care for excessive fetal growth, unspecified trimester, not applicable or unspecified: Secondary | ICD-10-CM | POA: Diagnosis present

## 2014-03-22 DIAGNOSIS — Q950 Balanced translocation and insertion in normal individual: Secondary | ICD-10-CM

## 2014-03-22 DIAGNOSIS — O09529 Supervision of elderly multigravida, unspecified trimester: Secondary | ICD-10-CM | POA: Diagnosis present

## 2014-03-22 DIAGNOSIS — Z823 Family history of stroke: Secondary | ICD-10-CM

## 2014-03-22 LAB — RPR

## 2014-03-22 SURGERY — Surgical Case
Anesthesia: Spinal | Site: Abdomen

## 2014-03-22 MED ORDER — MEASLES, MUMPS & RUBELLA VAC ~~LOC~~ INJ
0.5000 mL | INJECTION | Freq: Once | SUBCUTANEOUS | Status: DC
  Filled 2014-03-22: qty 0.5

## 2014-03-22 MED ORDER — NALBUPHINE HCL 10 MG/ML IJ SOLN: 5.0000 mg | INTRAMUSCULAR | Status: DC | PRN

## 2014-03-22 MED ORDER — OXYTOCIN 40 UNITS IN LACTATED RINGERS INFUSION - SIMPLE MED: 62.5000 mL/h | INTRAVENOUS | Status: AC

## 2014-03-22 MED ORDER — CEFAZOLIN SODIUM-DEXTROSE 2-3 GM-% IV SOLR
2.0000 g | INTRAVENOUS | Status: AC
  Administered 2014-03-22: 2 g via INTRAVENOUS

## 2014-03-22 MED ORDER — PHENYLEPHRINE 8 MG IN D5W 100 ML (0.08MG/ML) PREMIX OPTIME
INJECTION | INTRAVENOUS | Status: AC
  Filled 2014-03-22: qty 100

## 2014-03-22 MED ORDER — BUPIVACAINE IN DEXTROSE 0.75-8.25 % IT SOLN
INTRATHECAL | Status: DC | PRN
  Administered 2014-03-22: 1.6 mL via INTRATHECAL

## 2014-03-22 MED ORDER — FENTANYL CITRATE 0.05 MG/ML IJ SOLN: 25.0000 ug | INTRAMUSCULAR | Status: DC | PRN

## 2014-03-22 MED ORDER — PHENYLEPHRINE 8 MG IN D5W 100 ML (0.08MG/ML) PREMIX OPTIME
INJECTION | INTRAVENOUS | Status: DC | PRN
  Administered 2014-03-22: 60 ug/min via INTRAVENOUS

## 2014-03-22 MED ORDER — DIPHENHYDRAMINE HCL 25 MG PO CAPS: 25.0000 mg | ORAL_CAPSULE | ORAL | Status: DC | PRN

## 2014-03-22 MED ORDER — METHYLERGONOVINE MALEATE 0.2 MG/ML IJ SOLN: 0.2000 mg | INTRAMUSCULAR | Status: DC | PRN

## 2014-03-22 MED ORDER — IBUPROFEN 600 MG PO TABS
600.0000 mg | ORAL_TABLET | Freq: Four times a day (QID) | ORAL | Status: DC
  Administered 2014-03-22 – 2014-03-25 (×12): 600 mg via ORAL
  Filled 2014-03-22 (×12): qty 1

## 2014-03-22 MED ORDER — BUPIVACAINE HCL (PF) 0.25 % IJ SOLN
INTRAMUSCULAR | Status: AC
  Filled 2014-03-22: qty 30

## 2014-03-22 MED ORDER — MORPHINE SULFATE (PF) 0.5 MG/ML IJ SOLN
INTRAMUSCULAR | Status: DC | PRN
  Administered 2014-03-22: .15 mg via INTRATHECAL

## 2014-03-22 MED ORDER — PRENATAL MULTIVITAMIN CH
1.0000 | ORAL_TABLET | Freq: Every day | ORAL | Status: DC
  Filled 2014-03-22 (×3): qty 1

## 2014-03-22 MED ORDER — DIPHENHYDRAMINE HCL 50 MG/ML IJ SOLN: 25.0000 mg | INTRAMUSCULAR | Status: DC | PRN

## 2014-03-22 MED ORDER — MORPHINE SULFATE 0.5 MG/ML IJ SOLN
INTRAMUSCULAR | Status: AC
  Filled 2014-03-22: qty 10

## 2014-03-22 MED ORDER — ONDANSETRON HCL 4 MG/2ML IJ SOLN: 4.0000 mg | INTRAMUSCULAR | Status: DC | PRN

## 2014-03-22 MED ORDER — SIMETHICONE 80 MG PO CHEW: 80.0000 mg | CHEWABLE_TABLET | ORAL | Status: DC | PRN

## 2014-03-22 MED ORDER — METHYLERGONOVINE MALEATE 0.2 MG PO TABS: 0.2000 mg | ORAL_TABLET | ORAL | Status: DC | PRN

## 2014-03-22 MED ORDER — ONDANSETRON HCL 4 MG/2ML IJ SOLN: 4.0000 mg | Freq: Three times a day (TID) | INTRAMUSCULAR | Status: DC | PRN

## 2014-03-22 MED ORDER — ONDANSETRON HCL 4 MG/2ML IJ SOLN
INTRAMUSCULAR | Status: AC
  Filled 2014-03-22: qty 2

## 2014-03-22 MED ORDER — MEPERIDINE HCL 25 MG/ML IJ SOLN: 6.2500 mg | INTRAMUSCULAR | Status: DC | PRN

## 2014-03-22 MED ORDER — ONDANSETRON HCL 4 MG PO TABS: 4.0000 mg | ORAL_TABLET | ORAL | Status: DC | PRN

## 2014-03-22 MED ORDER — TETANUS-DIPHTH-ACELL PERTUSSIS 5-2.5-18.5 LF-MCG/0.5 IM SUSP: 0.5000 mL | Freq: Once | INTRAMUSCULAR | Status: DC

## 2014-03-22 MED ORDER — LACTATED RINGERS IV SOLN
INTRAVENOUS | Status: DC
  Administered 2014-03-22 (×2): via INTRAVENOUS

## 2014-03-22 MED ORDER — ONDANSETRON HCL 4 MG/2ML IJ SOLN
INTRAMUSCULAR | Status: DC | PRN
  Administered 2014-03-22: 4 mg via INTRAVENOUS

## 2014-03-22 MED ORDER — OXYTOCIN 10 UNIT/ML IJ SOLN
INTRAMUSCULAR | Status: AC
  Filled 2014-03-22: qty 4

## 2014-03-22 MED ORDER — SIMETHICONE 80 MG PO CHEW
80.0000 mg | CHEWABLE_TABLET | ORAL | Status: DC
  Administered 2014-03-22: 80 mg via ORAL
  Filled 2014-03-22 (×3): qty 1

## 2014-03-22 MED ORDER — FERROUS SULFATE 325 (65 FE) MG PO TABS
325.0000 mg | ORAL_TABLET | Freq: Two times a day (BID) | ORAL | Status: DC
  Administered 2014-03-22 – 2014-03-25 (×6): 325 mg via ORAL
  Filled 2014-03-22 (×6): qty 1

## 2014-03-22 MED ORDER — KETOROLAC TROMETHAMINE 30 MG/ML IJ SOLN: 30.0000 mg | Freq: Four times a day (QID) | INTRAMUSCULAR | Status: AC | PRN

## 2014-03-22 MED ORDER — LACTATED RINGERS IV SOLN
INTRAVENOUS | Status: DC | PRN
  Administered 2014-03-22: 10:00:00 via INTRAVENOUS

## 2014-03-22 MED ORDER — OXYCODONE-ACETAMINOPHEN 5-325 MG PO TABS: 1.0000 | ORAL_TABLET | ORAL | Status: DC | PRN

## 2014-03-22 MED ORDER — LANOLIN HYDROUS EX OINT: 1.0000 "application " | TOPICAL_OINTMENT | CUTANEOUS | Status: DC | PRN

## 2014-03-22 MED ORDER — FENTANYL CITRATE 0.05 MG/ML IJ SOLN
INTRAMUSCULAR | Status: DC | PRN
  Administered 2014-03-22: 25 ug via INTRATHECAL

## 2014-03-22 MED ORDER — SODIUM CHLORIDE 0.9 % IJ SOLN: 3.0000 mL | INTRAMUSCULAR | Status: DC | PRN

## 2014-03-22 MED ORDER — KETOROLAC TROMETHAMINE 30 MG/ML IJ SOLN
INTRAMUSCULAR | Status: AC
  Filled 2014-03-22: qty 1

## 2014-03-22 MED ORDER — MENTHOL 3 MG MT LOZG: 1.0000 | LOZENGE | OROMUCOSAL | Status: DC | PRN

## 2014-03-22 MED ORDER — SIMETHICONE 80 MG PO CHEW
80.0000 mg | CHEWABLE_TABLET | Freq: Three times a day (TID) | ORAL | Status: DC
  Administered 2014-03-22 – 2014-03-25 (×7): 80 mg via ORAL
  Filled 2014-03-22 (×7): qty 1

## 2014-03-22 MED ORDER — SCOPOLAMINE 1 MG/3DAYS TD PT72
1.0000 | MEDICATED_PATCH | Freq: Once | TRANSDERMAL | Status: DC
  Administered 2014-03-22: 1.5 mg via TRANSDERMAL

## 2014-03-22 MED ORDER — KETOROLAC TROMETHAMINE 30 MG/ML IJ SOLN
30.0000 mg | Freq: Once | INTRAMUSCULAR | Status: AC
  Administered 2014-03-22: 30 mg via INTRAVENOUS

## 2014-03-22 MED ORDER — DIBUCAINE 1 % RE OINT: 1.0000 "application " | TOPICAL_OINTMENT | RECTAL | Status: DC | PRN

## 2014-03-22 MED ORDER — ZOLPIDEM TARTRATE 5 MG PO TABS: 5.0000 mg | ORAL_TABLET | Freq: Every evening | ORAL | Status: DC | PRN

## 2014-03-22 MED ORDER — CEFAZOLIN SODIUM-DEXTROSE 2-3 GM-% IV SOLR
INTRAVENOUS | Status: AC
  Filled 2014-03-22: qty 50

## 2014-03-22 MED ORDER — NALOXONE HCL 1 MG/ML IJ SOLN
1.0000 ug/kg/h | INTRAVENOUS | Status: DC | PRN
  Filled 2014-03-22: qty 2

## 2014-03-22 MED ORDER — SCOPOLAMINE 1 MG/3DAYS TD PT72
MEDICATED_PATCH | TRANSDERMAL | Status: AC
  Administered 2014-03-22: 1.5 mg via TRANSDERMAL
  Filled 2014-03-22: qty 1

## 2014-03-22 MED ORDER — FENTANYL CITRATE 0.05 MG/ML IJ SOLN
INTRAMUSCULAR | Status: AC
  Filled 2014-03-22: qty 2

## 2014-03-22 MED ORDER — SENNOSIDES-DOCUSATE SODIUM 8.6-50 MG PO TABS
2.0000 | ORAL_TABLET | ORAL | Status: DC
  Administered 2014-03-22 – 2014-03-23 (×2): 2 via ORAL
  Filled 2014-03-22 (×3): qty 2

## 2014-03-22 MED ORDER — BUPIVACAINE HCL (PF) 0.25 % IJ SOLN
INTRAMUSCULAR | Status: DC | PRN
  Administered 2014-03-22: 20 mL

## 2014-03-22 MED ORDER — OXYTOCIN 10 UNIT/ML IJ SOLN
40.0000 [IU] | INTRAVENOUS | Status: DC | PRN
  Administered 2014-03-22: 40 [IU] via INTRAVENOUS

## 2014-03-22 MED ORDER — WITCH HAZEL-GLYCERIN EX PADS
1.0000 "application " | MEDICATED_PAD | CUTANEOUS | Status: DC | PRN
  Administered 2014-03-23: 1 via TOPICAL

## 2014-03-22 MED ORDER — DIPHENHYDRAMINE HCL 25 MG PO CAPS: 25.0000 mg | ORAL_CAPSULE | Freq: Four times a day (QID) | ORAL | Status: DC | PRN

## 2014-03-22 MED ORDER — LACTATED RINGERS IV SOLN
INTRAVENOUS | Status: DC
  Administered 2014-03-22: 19:00:00 via INTRAVENOUS

## 2014-03-22 MED ORDER — NALOXONE HCL 0.4 MG/ML IJ SOLN: 0.4000 mg | INTRAMUSCULAR | Status: DC | PRN

## 2014-03-22 MED ORDER — METOCLOPRAMIDE HCL 5 MG/ML IJ SOLN: 10.0000 mg | Freq: Three times a day (TID) | INTRAMUSCULAR | Status: DC | PRN

## 2014-03-22 MED ORDER — DIPHENHYDRAMINE HCL 50 MG/ML IJ SOLN: 12.5000 mg | INTRAMUSCULAR | Status: DC | PRN

## 2014-03-22 SURGICAL SUPPLY — 32 items
BENZOIN TINCTURE PRP APPL 2/3 (GAUZE/BANDAGES/DRESSINGS) ×2 IMPLANT
BOOTIES KNEE HIGH SLOAN (MISCELLANEOUS) ×4 IMPLANT
CLAMP CORD UMBIL (MISCELLANEOUS) ×2 IMPLANT
CLOTH BEACON ORANGE TIMEOUT ST (SAFETY) ×2 IMPLANT
DRAPE LG THREE QUARTER DISP (DRAPES) ×2 IMPLANT
DRSG OPSITE POSTOP 4X10 (GAUZE/BANDAGES/DRESSINGS) ×2 IMPLANT
DURAPREP 26ML APPLICATOR (WOUND CARE) ×2 IMPLANT
ELECT REM PT RETURN 9FT ADLT (ELECTROSURGICAL) ×2
ELECTRODE REM PT RTRN 9FT ADLT (ELECTROSURGICAL) ×1 IMPLANT
GLOVE BIOGEL PI IND STRL 7.0 (GLOVE) ×8 IMPLANT
GLOVE BIOGEL PI INDICATOR 7.0 (GLOVE) ×8
GLOVE ECLIPSE 6.5 STRL STRAW (GLOVE) ×2 IMPLANT
GLOVE SURG SS PI 7.0 STRL IVOR (GLOVE) ×14 IMPLANT
GOWN STRL REUS W/TWL LRG LVL3 (GOWN DISPOSABLE) ×6 IMPLANT
KIT ABG SYR 3ML LUER SLIP (SYRINGE) IMPLANT
NEEDLE HYPO 22GX1.5 SAFETY (NEEDLE) ×2 IMPLANT
NEEDLE HYPO 25X5/8 SAFETYGLIDE (NEEDLE) ×2 IMPLANT
NS IRRIG 1000ML POUR BTL (IV SOLUTION) ×4 IMPLANT
PACK C SECTION WH (CUSTOM PROCEDURE TRAY) ×2 IMPLANT
PAD OB MATERNITY 4.3X12.25 (PERSONAL CARE ITEMS) ×2 IMPLANT
RETRACTOR WND ALEXIS 25 LRG (MISCELLANEOUS) ×1 IMPLANT
RTRCTR C-SECT PINK 25CM LRG (MISCELLANEOUS) ×2 IMPLANT
RTRCTR WOUND ALEXIS 25CM LRG (MISCELLANEOUS) ×2
STRIP CLOSURE SKIN 1/2X4 (GAUZE/BANDAGES/DRESSINGS) ×2 IMPLANT
SUT MNCRL AB 3-0 PS2 27 (SUTURE) ×2 IMPLANT
SUT VIC AB 0 CTX 36 (SUTURE) ×2
SUT VIC AB 0 CTX36XBRD ANBCTRL (SUTURE) ×2 IMPLANT
SUT VIC AB 1 CT1 36 (SUTURE) ×4 IMPLANT
SYR 20CC LL (SYRINGE) ×2 IMPLANT
TOWEL OR 17X24 6PK STRL BLUE (TOWEL DISPOSABLE) ×2 IMPLANT
TRAY FOLEY CATH 14FR (SET/KITS/TRAYS/PACK) ×2 IMPLANT
WATER STERILE IRR 1000ML POUR (IV SOLUTION) ×2 IMPLANT

## 2014-03-22 NOTE — Transfer of Care (Signed)
Immediate Anesthesia Transfer of Care Note  Patient: Ebony Lamb  Procedure(s) Performed: Procedure(s): Repeat CESAREAN SECTION (N/A)  Patient Location: PACU  Anesthesia Type:Spinal  Level of Consciousness: awake, alert  and oriented  Airway & Oxygen Therapy: Patient Spontanous Breathing  Post-op Assessment: Report given to PACU RN and Post -op Vital signs reviewed and stable  Post vital signs: Reviewed and stable  Complications: No apparent anesthesia complications

## 2014-03-22 NOTE — H&P (Signed)
Ebony Lamb is a 38 y.o. female presenting for scheduled repeat c/s at [redacted]w[redacted]d. She denies any VB, LOF or ctx. +FM.    Pregnancy course:  Pt began [facility] w [facility] at [place], [place] based on LMP =7/1  abnormal nuchal translucency= 3.65mm  Normal [place], hypoplastic nasal bone, normal fetal echo.  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  Echogenic bowel Negative CF testing  Maternal Medical History:  Reason for admission: Scheduled c/s  Contractions: Frequency: rare.    Fetal activity: Perceived fetal activity is normal.   Last perceived fetal movement was within the past hour.    Prenatal complications: no prenatal complications   OB History   Grav Para Term Preterm Abortions TAB SAB Ect Mult Living   3 1 1  1 1    1      2006 - TAB  11/2011 - 41wks, primary C/S 9#3oz - IOL for 43hrs - female   Past Medical History  Diagnosis Date  . No pertinent past medical history   . Preterm labor   . H/O varicella   . Abnormal Pap smear 2002  . Hypertension 2013    post partum- meds x 1 mo  . Depression     history of depression- denies currently   Past Surgical History  Procedure Laterality Date  . Foot surgery    . Wisdom tooth extraction    . Cesarean section  11/14/2011    Procedure: CESAREAN SECTION;  Surgeon: Ebony Hose, MD;  Location: [facility];  Service: Gynecology;  Laterality: N/A;  Primary cesarean section with delivery of baby boy at [phone]. Apgars 9/9.   Family History: family history includes Cancer in her father, maternal grandmother, and paternal grandmother; Depression in her father; Heart disease in her maternal grandfather; Hyperlipidemia in her brother and father; Hypertension in her brother and father; Kidney disease in her father; Stroke in her paternal grandfather. There is no history of Anesthesia problems or Diabetes. Social History:  reports that she has never smoked. She has never used smokeless tobacco. She reports that she does not drink  alcohol or use illicit drugs.   Prenatal Transfer Tool  Maternal Diabetes: No Genetic Screening: Abnormal:  Results: Other: Maternal Ultrasounds/Referrals: Normal Fetal Ultrasounds or other Referrals:  Fetal echo, Referred to Materal Fetal Medicine  Maternal Substance Abuse:  No Significant Maternal Medications:  None Significant Maternal Lab Results:  Lab values include: Group B Strep negative Other Comments:  None  Review of Systems  All other systems reviewed and are negative.     Blood pressure 105/74, pulse 78, temperature 98.2 F (36.8 C), temperature source Oral, resp. rate 18, last menstrual period 06/06/2013, SpO2 100.00%. Maternal Exam:  Abdomen: Patient reports no abdominal tenderness. Fundal height is aga.   Estimated fetal weight is 8#.   Fetal presentation: vertex  Introitus: not evaluated.   Cervix: not evaluated.   Fetal Exam Fetal Monitor Review: Mode: hand-held doppler probe.       Physical Exam  Nursing note and vitals reviewed. Constitutional: She is oriented to person, place, and time. She appears well-developed and well-nourished.  HENT:  Head: Normocephalic.  Eyes: Pupils are equal, round, and reactive to light.  Neck: Normal range of motion.  Cardiovascular: Normal rate, regular rhythm and normal heart sounds.   Respiratory: Effort normal and breath sounds normal.  GI: Soft. Bowel sounds are normal.  Genitourinary: Vagina normal.  Musculoskeletal: Normal range of motion.  Neurological: She is alert and oriented to  person, place, and time. She has normal reflexes.  Skin: Skin is warm and dry.  Psychiatric: She has a normal mood and affect. Her behavior is normal.    Prenatal labs: ABO, Rh: --/--/O POS (07/09 1402) Antibody: NEG (07/09 1357) Rubella: Immune (11/11 0000) RPR: NON REAC (07/09 1358)  HBsAg: Negative (11/11 0000)  HIV: Non-reactive (11/11 0000)  GBS:   neg 6/10   Assessment/Plan: IUP at [redacted]w[redacted]d Scheduled for repeat  c/s Abnormal genetic testing  Admit to pre-op per Dr Ebony Lamb Routine Pre-op orders    Ebony Lamb M 03/22/2014, 8:26 AM

## 2014-03-22 NOTE — Anesthesia Postprocedure Evaluation (Signed)
  Anesthesia Post-op Note  Patient: Ebony Lamb  Procedure(s) Performed: Procedure(s): Repeat CESAREAN SECTION (N/A)  Patient Location: PACU  Anesthesia Type:Spinal  Level of Consciousness: awake, alert  and oriented  Airway and Oxygen Therapy: Patient Spontanous Breathing  Post-op Pain: none  Post-op Assessment: Post-op Vital signs reviewed, Patient's Cardiovascular Status Stable, Respiratory Function Stable, Patent Airway, No signs of Nausea or vomiting, Pain level controlled, No headache and No backache  Post-op Vital Signs: Reviewed and stable  Last Vitals:  Filed Vitals:   03/22/14 1215  BP: 106/68  Pulse: 60  Temp:   Resp: 17    Complications: No apparent anesthesia complications

## 2014-03-22 NOTE — Anesthesia Preprocedure Evaluation (Addendum)
Anesthesia Evaluation  Patient identified by MRN, date of birth, ID band Patient awake    Reviewed: Allergy & Precautions, H&P , NPO status , Patient's Chart, lab work & pertinent test results  Airway Mallampati: III TM Distance: >3 FB Neck ROM: Full    Dental no notable dental hx. (+) Teeth Intact   Pulmonary neg pulmonary ROS,  breath sounds clear to auscultation  Pulmonary exam normal       Cardiovascular hypertension, Pt. on medications Rhythm:Regular Rate:Normal     Neuro/Psych PSYCHIATRIC DISORDERS Depression negative neurological ROS     GI/Hepatic negative GI ROS, Neg liver ROS,   Endo/Other  negative endocrine ROS  Renal/GU negative Renal ROS  negative genitourinary   Musculoskeletal negative musculoskeletal ROS (+)   Abdominal   Peds  Hematology negative hematology ROS (+)   Anesthesia Other Findings   Reproductive/Obstetrics (+) Pregnancy Fetal macrosomia Previous C/Section                          Anesthesia Physical Anesthesia Plan  ASA: II  Anesthesia Plan: Spinal   Post-op Pain Management:    Induction:   Airway Management Planned: Natural Airway  Additional Equipment:   Intra-op Plan:   Post-operative Plan:   Informed Consent: I have reviewed the patients History and Physical, chart, labs and discussed the procedure including the risks, benefits and alternatives for the proposed anesthesia with the patient or authorized representative who has indicated his/her understanding and acceptance.     Plan Discussed with: Anesthesiologist, CRNA and Surgeon  Anesthesia Plan Comments:         Anesthesia Quick Evaluation

## 2014-03-22 NOTE — Op Note (Signed)
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 Josephine Igo  Procedure: Repeat low transverse cesarean section  Surgeon: Dr. Katharine Look Christophr Calix  Assistant: Arville Go CNM  Estimated blood loss: 800 cc  Procedure:  After being informed of the planned procedure and possible complications including bleeding, infection, injury to other organs, informed consent is obtained. The patient is taken to OR #9 and given spinal anesthesia without complication. She is placed in the dorsal decubitus position with the pelvis tilted to the left. She is then prepped and draped in a sterile fashion. A Foley catheter is inserted in her bladder.  After assessing adequate level of anesthesia, we infiltrate the suprapubic area with 20 cc of Marcaine 0.25 and perform a Pfannenstiel incision which is brought down sharply to the fascia. The fascia is entered in a low transverse fashion. Linea alba is dissected. Peritoneum is entered in a midline fashion. An Alexis retractor is easily positioned.   The myometrium is then entered in a low transverse fashion, 2 cm above the vesico-uterine junction ; first with knife and then extended bluntly. Amniotic fluid is clear. We assist the birth of a female  infant in vertex presentation. Mouth and nose are suctioned. The baby is delivered. The cord is clamped and sectioned. The baby is given to the neonatologist present in the room.  10 cc of blood is drawn from the umbilical vein.The placenta is allowed to deliver spontaneously. It is complete and the cord has 3 vessels. Uterine revision is negative.  We proceed with closure of the myometrium in 2 layers: First with a running locked suture of 0 Vicryl, then with a Lembert suture of 0 Vicryl imbricating the first one. Hemostasis is completed with cauterization  on peritoneal edges.  Both paracolic gutters are cleaned. Both tubes and ovaries are assessed and normal. The pelvis is profusely irrigated with warm saline to confirm a satisfactory hemostasis.  Retractors and sponges are removed. Under fascia hemostasis is completed with cauterization. The fascia is then closed with 2 running sutures of 0 Vicryl meeting midline. The wound is irrigated with warm saline and hemostasis is completed with cauterization. The skin is closed with a subcuticular suture of 3-0 Monocryl and Steri-Strips.  Instrument and sponge count is complete x2. Estimated blood loss is 800 cc.  The procedure is well tolerated by the patient who is taken to recovery room in a well and stable condition.  female baby named Cori Razor was born at 10:13. Apgars are pending.    Specimen: Placenta sent to L & D   Syan Cullimore A MD 7/10/201510:50 AM

## 2014-03-22 NOTE — Lactation Note (Signed)
This note was copied from the chart of Ebony Verlie Liotta. Lactation Consultation Note  Patient Name: Ebony Lamb [id number] Date: 03/22/2014 Reason for consult: Initial assessment Baby 8 hours of life. Mom reports that she BF first child over 2 years, and during this pregnancy. Mom states she is seeing colostrum and baby seems to be latching well. Mom states milk came in late first time and had to supplement with formula. Enc mom to offer lots of STS and nurse often, with cues, at least 8-12 times per/24 hours. Mom states she will call for LC to see a latch at next feeding. Mom given [facility] brochure, aware of OP/BFSG and community resources.   Maternal Data Has patient been taught Hand Expression?:  (Mom visiting with 57 year old, will call at next feeding.) Does the patient have breastfeeding experience prior to this delivery?: Yes  Feeding Feeding Type:  (Will call out to see a latch.) Length of feed: 20 min  LATCH Score/Interventions Latch: Repeated attempts needed to sustain latch, nipple held in mouth throughout feeding, stimulation needed to elicit sucking reflex. Intervention(s): Adjust position;Assist with latch  Audible Swallowing: A few with stimulation Intervention(s): Skin to skin  Type of Nipple: Everted at rest and after stimulation  Comfort (Breast/Nipple): Soft / non-tender     Hold (Positioning): Assistance needed to correctly position infant at breast and maintain latch. Intervention(s): Breastfeeding basics reviewed;Support Pillows;Position options;Skin to skin  LATCH Score: 7  Lactation Tools Discussed/Used     Consult Status Consult Status: Follow-up Date: 03/23/14 Follow-up type: In-patient    Ebony Lamb 03/22/2014, 6:28 PM

## 2014-03-22 NOTE — Interval H&P Note (Signed)
History and Physical Interval Note:  03/22/2014 9:12 AM  Ebony Lamb  has presented today for surgery, with the diagnosis of Macrosomia, Previous Cesarean Section  The various methods of treatment have been discussed with the patient and family. After consideration of risks, benefits and other options for treatment, the patient has consented to  Procedure(s): Repeat CESAREAN SECTION (N/A) as a surgical intervention .  The patient's history has been reviewed, patient examined, no change in status, stable for surgery.  I have reviewed the patient's chart and labs.  Questions were answered to the patient's satisfaction.     Merril Nagy A

## 2014-03-22 NOTE — Anesthesia Procedure Notes (Signed)
Spinal  Patient location during procedure: OR Start time: 03/22/2014 9:44 AM Staffing Anesthesiologist: Jaremy Nosal A. Performed by: anesthesiologist  Preanesthetic Checklist Completed: patient identified, site marked, surgical consent, pre-op evaluation, timeout performed, IV checked, risks and benefits discussed and monitors and equipment checked Spinal Block Patient position: sitting Prep: site prepped and draped and DuraPrep Patient monitoring: heart rate, cardiac monitor, continuous pulse ox and blood pressure Approach: midline Location: L3-4 Injection technique: single-shot Needle Needle type: Sprotte  Needle gauge: 24 G Needle length: 9 cm Needle insertion depth: 5 cm Assessment Sensory level: T4 Additional Notes Patient tolerated procedure well. Adequate sensory level.

## 2014-03-23 LAB — CBC
HEMATOCRIT: 34.1 % — AB (ref 36.0–46.0)
Hemoglobin: 11.5 g/dL — ABNORMAL LOW (ref 12.0–15.0)
MCH: 32.3 pg (ref 26.0–34.0)
MCHC: 33.7 g/dL (ref 30.0–36.0)
MCV: 95.8 fL (ref 78.0–100.0)
PLATELETS: 189 10*3/uL (ref 150–400)
RBC: 3.56 MIL/uL — ABNORMAL LOW (ref 3.87–5.11)
RDW: 13 % (ref 11.5–15.5)
WBC: 10.1 10*3/uL (ref 4.0–10.5)

## 2014-03-23 LAB — BIRTH TISSUE RECOVERY COLLECTION (PLACENTA DONATION)

## 2014-03-23 NOTE — Anesthesia Postprocedure Evaluation (Signed)
Anesthesia Post Note  Patient: Ebony Lamb  Procedure(s) Performed: Procedure(s) (LRB): Repeat CESAREAN SECTION (N/A)  Anesthesia type: Spinal  Patient location: Mother/Baby  Post pain: Pain level controlled  Post assessment: Post-op Vital signs reviewed  Last Vitals:  Filed Vitals:   03/23/14 0545  BP: 108/70  Pulse: 57  Temp: 36.4 C  Resp: 20    Post vital signs: Reviewed  Level of consciousness:alert  Complications: No apparent anesthesia complications

## 2014-03-23 NOTE — Addendum Note (Signed)
Addendum created 03/23/14 0830 by Lenox Ponds, CRNA   Modules edited: Notes Section   Notes Section:  File: [id number]

## 2014-03-23 NOTE — Progress Notes (Signed)
Subjective: Postpartum Day 1: Cesarean Delivery due to repeat  Patient up ad lib, reports no syncope or dizziness. Feeding:  Breast feeding Contraceptive plan:  n/a  Objective: Vital signs in last 24 hours: Temp:  [97.3 F (36.3 C)-98.4 F (36.9 C)] 97.6 F (36.4 C) (07/11 0545) Pulse Rate:  [56-87] 57 (07/11 0545) Resp:  [15-20] 20 (07/11 0545) BP: (104-121)/(56-80) 108/70 mmHg (07/11 0545) SpO2:  [97 %-100 %] 97 % (07/11 0130) Weight:  [160 lb (72.576 kg)] 160 lb (72.576 kg) (07/10 0815)  Physical Exam:  General: alert and cooperative Lochia: appropriate Uterine Fundus: firm Abdomen:  + bowel sounds, non distended,   incision: healing well  Honeycomb dressing CDI DVT Evaluation: No evidence of DVT seen on physical exam. Homan's sign: Negative JP drain:   n/a   Recent Labs  03/21/14 1358 03/23/14 0553  HGB 12.1 11.5*  HCT 35.9* 34.1*  WBC 10.5 10.1    Assessment/Plan: Status post Cesarean section day 1 Doing well postoperatively.  Continue current care. Plan for discharge tomorrow    Bailey Kolbe CNM, @MSN  03/23/2014, 7:52 AM

## 2014-03-23 NOTE — Lactation Note (Signed)
This note was copied from the chart of Ebony Sheelah Ritacco. Lactation Consultation Note Follow up visit at 31 hours of age.  Mom is concerned about supply due to late lactogenesis 2 with older child.  Mom is able to hand express colostrum, baby has had 10 feedings and adequate output over the past 24 hours.  Encouraged mom to continue hand expression pre and post feedings to rub into nipples. Baby latches well in modified cradle hold independently.  Instructed on cross cradle hold to enhance latch positioning.  Mom to call for assist as needed.   Patient Name: Ebony Lamb [id number] Date: 03/23/2014 Reason for consult: Follow-up assessment   Maternal Data Does the patient have breastfeeding experience prior to this delivery?: Yes  Feeding Feeding Type: Breast Fed Length of feed:  (observed 15 minutes)  LATCH Score/Interventions Latch: Grasps breast easily, tongue down, lips flanged, rhythmical sucking.  Audible Swallowing: A few with stimulation Intervention(s): Skin to skin;Hand expression Intervention(s): Hand expression  Type of Nipple: Everted at rest and after stimulation  Comfort (Breast/Nipple): Soft / non-tender     Hold (Positioning): Assistance needed to correctly position infant at breast and maintain latch. Intervention(s): Skin to skin;Position options;Support Pillows;Breastfeeding basics reviewed  LATCH Score: 8  Lactation Tools Discussed/Used     Consult Status Consult Status: Follow-up Date: 03/24/14 Follow-up type: In-patient    Justice Britain 03/23/2014, 5:49 PM

## 2014-03-24 NOTE — Lactation Note (Signed)
This note was copied from the chart of Ebony Emmaline Wahba. Lactation Consultation Note  Mother concerned since her milk supply did not transition with first baby until 6 days. Mother has small breasts with large nipples. Reviewed hand expression with teachback, drops of colostrum expressed. Set up DEBP, reviewed milk storage, cleaning, curved tip syringe and foley cup. Encouraged mother to massage and hand express before and after pumping. Mother pumped for 15 min, drops of colostrum pumped. Mother then latched baby in cradle position on her side.  Reviewed how to latch with cross cradle for more head control. Encouraged massaging breasts during feeding. Sucks observed but then baby unlatched and started stooling. Encouraged mother to post pump 4-6 times a day for 15 min. Provided volume guidelines and suggested mother call if she has further questions.     Patient Name: Ebony Lamb [id number] Date: 03/24/2014 Reason for consult: Follow-up assessment   Maternal Data Has patient been taught Hand Expression?: Yes  Feeding Feeding Type: Breast Fed Length of feed: 60 min  LATCH Score/Interventions Latch: Grasps breast easily, tongue down, lips flanged, rhythmical sucking.  Audible Swallowing: Spontaneous and intermittent  Type of Nipple: Everted at rest and after stimulation  Comfort (Breast/Nipple): Filling, red/small blisters or bruises, mild/mod discomfort     Hold (Positioning): No assistance needed to correctly position infant at breast.  LATCH Score: 9  Lactation Tools Discussed/Used Pump Review: Setup, frequency, and cleaning;Milk Storage Initiated by:: Vivianne Master RN Date initiated:: 03/24/14   Consult Status Consult Status: Follow-up Date: 03/25/14 Follow-up type: In-patient    Vivianne Master [facility] 03/24/2014, 9:47 AM

## 2014-03-24 NOTE — Progress Notes (Addendum)
Subjective: Postpartum Day 3: Cesarean Delivery due to repeat. Patient up ad lib, reports no syncope or dizziness. Tolerating regular diet, voiding qs. Mild incisional pain when rising, has not required anything stronger than Ibuprofen.  +flatus and at least 2 bowel movements since delivery. Gas pain on occasion; relieved w/ Mylicon and ambulation. Scant bleeding. Concerned that baby is not gaining weight. Still working w/ LC. Feeding:  Breast. Contraceptive plan: Condoms until IUD placement. Pt uncertain if baby will be discharged due to inadequate wt gain, but understands she will be and will likely remain in same room in capacity as boarder.  Objective: Vital signs in last 24 hours: Temp:  [97.9 F (36.6 C)-98.2 F (36.8 C)] 98 F (36.7 C) (07/12 0540) Pulse Rate:  [62-70] 62 (07/12 0540) Resp:  [18-20] 18 (07/12 0540) BP: (98-115)/(62-65) 98/62 mmHg (07/12 0540) SpO2:  [97 %] 97 % (07/11 0952)  Physical Exam:  General: alert Lungs: CTAB CV: RRR Lochia: appropriate Uterine Fundus: firm Abdomen: gravid, soft, NTND, + flatus, +BM. Incision: Honeycomb dressing CDI DVT Evaluation: No evidence of DVT seen on physical exam. Negative Homan's sign.   Recent Labs  03/21/14 1358 03/23/14 0553  HGB 12.1 11.5*  HCT 35.9* 34.1*  WBC 10.5 10.1    Assessment/Plan: Status post Cesarean section day 3. Doing well postoperatively.  Infant w/ weight issues.  Plan: D/C home  Donnel Saxon CNM, [place] 03/24/2014, 7:42 AM  Addendum: Patient elects to be d/c'd tomorrow, due to continuing work with LCs on latch, feedings, and production. Will anticipate d/c tomorrow.  Donnel Saxon, CNM 03/24/14 1:15p    Farrel Gordon, CNM, MS 03/25/14 @ 8:13 AM

## 2014-03-25 ENCOUNTER — Encounter (HOSPITAL_COMMUNITY): Payer: Self-pay | Admitting: Obstetrics and Gynecology

## 2014-03-25 DIAGNOSIS — Q950 Balanced translocation and insertion in normal individual: Secondary | ICD-10-CM

## 2014-03-25 MED ORDER — IBUPROFEN 600 MG PO TABS: 600.0000 mg | ORAL_TABLET | Freq: Four times a day (QID) | ORAL | Status: DC | PRN

## 2014-03-25 MED ORDER — OXYCODONE-ACETAMINOPHEN 5-325 MG PO TABS: 1.0000 | ORAL_TABLET | ORAL | Status: DC | PRN

## 2014-03-25 NOTE — Discharge Summary (Signed)
Cesarean Section Delivery Discharge Summary   Ebony Lamb   DOB: 12-14-1975   MRN: [id number]   CSN: [id number]   Date of admission: 03/22/14   Date of discharge: 03/25/14   Procedures this admission: Repeat LTCS due to repeat   Date of Delivery: 03/22/14   Newborn Data:  Live born female  Birth Weight: 8 lb 3.4 oz (3725 g)  APGAR: --not listed in mom or baby chart--leadership notified.   Per NBN note, "infant with positive Coombs test and B/O incompatabitlity but no jaundice with every 8 hour TCBs all in low risk zone. Most recent TcB=4.8 at 66 hours age. Breastfeeding well but may have delay in milk production, LATCH 9, with 11 % weight loss from birth last night. Lactation consulted with mom several times and discussed supplementation- mom hesitant so will reweigh baby again this am. If further weight loss mom has agreed to supplement with 1 ounce formula after a feeding- 4 times over 24 hours. Mom spoke to genetics counselor over phone re: prenatal screening results of balanced translocation but no office consult."  Name: Ebony Lamb   History of Present Illness:  Ms. Ebony Lamb is a 38 y.o. female, [id number], who presents at [redacted]w[redacted]d weeks gestation. The patient has been followed at the [facility] of [facility].  Her pregnancy has been complicated by:  Patient Active Problem List    Diagnosis  Date Noted   .  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  Cesarean delivery delivered  03/22/2014   .  Pregnancy induced hypertension--with 1st pregnancy  12/27/2011   .  History of irregular menstrual bleeding  11/12/2011   .  History of eating disorder  11/12/2011   .  History of cardiac arrhythmia - PAC's - cardiac work-up normal  11/12/2011    Hospital Course--Scheduled Cesarean:  Patient was admitted on 03/22/14 for a scheduled repeat cesarean delivery. She was taken to the operating room, where Dr. Cletis Media  performed a repeat LTCS under spinal anesthesia, with delivery of a viable female, with weight as listed above. Apgars not listed in mom or baby's chart. Infant was in good condition, without any obvious abnormalities, and remained at the patient's bedside. The patient was taken to recovery in good condition. Patient planned to breast feed. On post-op day 1, patient was doing well, tolerating a regular diet, with Hgb of 11.5. Throughout her stay, her physical exam was WNL, her incision was CDI, and her vital signs remained stable. By post-op day 3, she was up ad lib, tolerating a regular diet, passing gas/several BMs, and good pain control with po med. Her pain has been managed solely with Ibuprofen. She was deemed to have received the full benefit of her hospital stay, and was discharged home in stable condition. Contraceptive choice was IUD, condoms in the interim.    Feeding:  breast - appropriate latch, audible sucks and swallows  Contraception:   Condoms, then IUD  Discharge hemoglobin:  Hemoglobin   Date  Value  Ref Range  Status   03/23/2014  11.5*  12.0 - 15.0 g/dL  Final   03/21/2014  12.1  12.0 - 15.0 g/dL  Final   11/26/2011  12.4  12.0 - 15.0 g/dL  Final      HCT   Date  Value  Ref Range  Status   03/23/2014  34.1*  36.0 - 46.0 %  Final   03/21/2014  35.9*  36.0 - 46.0 %  Final   11/26/2011  36.8  36.0 - 46.0 %  Final      WBC   Date  Value  Ref Range  Status   03/23/2014  10.1  4.0 - 10.5 K/uL  Final   03/21/2014  10.5  4.0 - 10.5 K/uL  Final   11/26/2011  10.2  4.0 - 10.5 K/uL  Final    Discharge Physical Exam:  General: alert, mildly distressed about baby's inadequate weight gain Lochia: appropriate, minimal Uterine Fundus: firm @ U-1  Abdomen: + bowel sounds, no distension  Incision: Honeycomb dressing CDI  DVT Evaluation: No evidence of DVT seen on physical exam.   Intrapartum Procedures: cesarean: low cervical, transverse   Postpartum Procedures: none    Complications-Operative and Postpartum: none   Discharge Diagnoses: Term Pregnancy-delivered, scheduled repeat cesarean section, 14/21 balanced Translocation -- normal fetal echo (this pregnancy)    Activity: pelvic rest  Diet: routine  Medications: Ibuprofen and Percocet and PNVs Condition: stable   Discharge to: home   Donnel Saxon CNM  03/25/2014 6:09 AM    AMENDED ON 03/25/14 @ 08:47 AM by Farrel Gordon, CNM, MS

## 2014-03-25 NOTE — Lactation Note (Signed)
This note was copied from the chart of [name]. Lactation Consultation Note  Patient Name: [name] [id number] Date: 03/25/2014 Reason for consult: Follow-up assessment;Infant weight loss Baby has been cluster feeding throughout the night and this am. LC noted that baby had shallow latch at this visit with Mom using cradle hold. [place] assisted Mom with positioning and obtaining more depth with latch. LC notes baby pulling on/off the breast and appears to not be satisfied with feeding. Baby has had 10 voids with last void at 2330 03/24/14, 5 stools with last stool 0930 03/24/14. Mom reports she has been trying not to supplement this baby as she did with her son. LC encouraged Mom to consider supplements as this baby acts hungry, is not satisfied at the breast. RN re-weighed baby this am and no increase in weight loss since last night reported. LC encouraged Mom to consider BF with each feeding for 15-30 minutes each breast, then supplementing with EBM/formula 15 ml with each feeding till milk comes to volume or more as needed to satisfy baby. Discussed ways to supplement. LC advised Mom that would expect her milk to be coming in as early as tonight or tomorrow with the baby nursing frequently. Mom does not want to give supplement at this time. LC advised Mom if she decides to supplement and would like assist to call. Engorgement care reviewed if needed. Advised of OP services and support group.   Maternal Data    Feeding Feeding Type: Breast Fed Length of feed: 20 min (off and on)  LATCH Score/Interventions Latch: Grasps breast easily, tongue down, lips flanged, rhythmical sucking. Intervention(s): Adjust position;Assist with latch;Breast compression  Audible Swallowing: A few with stimulation  Type of Nipple: Everted at rest and after stimulation  Comfort (Breast/Nipple): Filling, red/small blisters or bruises, mild/mod discomfort  Problem noted: Mild/Moderate discomfort Interventions  (Mild/moderate discomfort): Comfort gels;Hand massage;Hand expression  Hold (Positioning): Assistance needed to correctly position infant at breast and maintain latch. (to obtain more depth w/latch) Intervention(s): Breastfeeding basics reviewed;Support Pillows;Position options;Skin to skin  LATCH Score: 7  Lactation Tools Discussed/Used Tools: Pump;Comfort gels Breast pump type: Manual   Consult Status Consult Status: Complete Date: 03/25/14 Follow-up type: In-patient    Katrine Coho 03/25/2014, 9:22 AM

## 2014-03-25 NOTE — Discharge Instructions (Signed)
Postpartum Depression and Baby Blues °The postpartum period begins right after the birth of a baby. During this time, there is often a great amount of joy and excitement. It is also a time of many changes in the life of the parents. Regardless of how many times a mother gives birth, each child brings new challenges and dynamics to the family. It is not unusual to have feelings of excitement along with confusing shifts in moods, emotions, and thoughts. All mothers are at risk of developing postpartum depression or the "baby blues." These mood changes can occur right after giving birth, or they may occur many months after giving birth. The baby blues or postpartum depression can be mild or severe. Additionally, postpartum depression can go away rather quickly, or it can be a long-term condition.  °CAUSES °Raised hormone levels and the rapid drop in those levels are thought to be a main cause of postpartum depression and the baby blues. A number of hormones change during and after pregnancy. Estrogen and progesterone usually decrease right after the delivery of your baby. The levels of thyroid hormone and various cortisol steroids also rapidly drop. Other factors that play a role in these mood changes include major life events and genetics.  °RISK FACTORS °If you have any of the following risks for the baby blues or postpartum depression, know what symptoms to watch out for during the postpartum period. Risk factors that may increase the likelihood of getting the baby blues or postpartum depression include: °· Having a personal or family history of depression.   °· Having depression while being pregnant.   °· Having premenstrual mood issues or mood issues related to oral contraceptives. °· Having a lot of life stress.   °· Having marital conflict.   °· Lacking a social support network.   °· Having a baby with special needs.   °· Having health problems, such as diabetes.   °SIGNS AND SYMPTOMS °Symptoms of baby blues  include: °· Brief changes in mood, such as going from extreme happiness to sadness. °· Decreased concentration.   °· Difficulty sleeping.   °· Crying spells, tearfulness.   °· Irritability.   °· Anxiety.   °Symptoms of postpartum depression typically begin within the first month after giving birth. These symptoms include: °· Difficulty sleeping or excessive sleepiness.   °· Marked weight loss.   °· Agitation.   °· Feelings of worthlessness.   °· Lack of interest in activity or food.   °Postpartum psychosis is a very serious condition and can be dangerous. Fortunately, it is rare. Displaying any of the following symptoms is cause for immediate medical attention. Symptoms of postpartum psychosis include:  °· Hallucinations and delusions.   °· Bizarre or disorganized behavior.   °· Confusion or disorientation.   °DIAGNOSIS  °A diagnosis is made by an evaluation of your symptoms. There are no medical or lab tests that lead to a diagnosis, but there are various questionnaires that a health care provider may use to identify those with the baby blues, postpartum depression, or psychosis. Often, a screening tool called the Edinburgh Postnatal Depression Scale is used to diagnose depression in the postpartum period.  °TREATMENT °The baby blues usually goes away on its own in 1-2 weeks. Social support is often all that is needed. You will be encouraged to get adequate sleep and rest. Occasionally, you may be given medicines to help you sleep.  °Postpartum depression requires treatment because it can last several months or longer if it is not treated. Treatment may include individual or group therapy, medicine, or both to address any social, physiological, and psychological   factors that may play a role in the depression. Regular exercise, a healthy diet, rest, and social support may also be strongly recommended.  Postpartum psychosis is more serious and needs treatment right away. Hospitalization is often needed. HOME CARE  INSTRUCTIONS  Get as much rest as you can. Nap when the baby sleeps.   Exercise regularly. Some women find yoga and walking to be beneficial.   Eat a balanced and nourishing diet.   Do little things that you enjoy. Have a cup of tea, take a bubble bath, read your favorite magazine, or listen to your favorite music.  Avoid alcohol.   Ask for help with household chores, cooking, grocery shopping, or running errands as needed. Do not try to do everything.   Talk to people close to you about how you are feeling. Get support from your partner, family members, friends, or other new moms.  Try to stay positive in how you think. Think about the things you are grateful for.   Do not spend a lot of time alone.   Only take over-the-counter or prescription medicine as directed by your health care provider.  Keep all your postpartum appointments.   Let your health care provider know if you have any concerns.  SEEK MEDICAL CARE IF: You are having a reaction to or problems with your medicine. SEEK IMMEDIATE MEDICAL CARE IF:  You have suicidal feelings.   You think you may harm the baby or someone else. MAKE SURE YOU:  Understand these instructions.  Will watch your condition.  Will get help right away if you are not doing well or get worse. Document Released: 06/03/2004 Document Revised: 09/04/2013 Document Reviewed: 06/11/2013 [facility] Patient Information 2015 [place], [place]. This information is not intended to replace advice given to you by your health care provider. Make sure you discuss any questions you have with your health care provider. Breastfeeding Deciding to breastfeed is one of the best choices you can make for you and your baby. A change in hormones during pregnancy causes your breast tissue to grow and increases the number and size of your milk ducts. These hormones also allow proteins, sugars, and fats from your blood supply to make breast milk in your  milk-producing glands. Hormones prevent breast milk from being released before your baby is born as well as prompt milk flow after birth. Once breastfeeding has begun, thoughts of your baby, as well as his or her sucking or crying, can stimulate the release of milk from your milk-producing glands.  BENEFITS OF BREASTFEEDING For Your Baby  Your first milk (colostrum) helps your baby's digestive system function better.   There are antibodies in your milk that help your baby fight off infections.   Your baby has a lower incidence of asthma, allergies, and sudden infant death syndrome.   The nutrients in breast milk are better for your baby than infant formulas and are designed uniquely for your baby's needs.   Breast milk improves your baby's brain development.   Your baby is less likely to develop other conditions, such as childhood obesity, asthma, or type 2 diabetes mellitus.  For You   Breastfeeding helps to create a very special bond between you and your baby.   Breastfeeding is convenient. Breast milk is always available at the correct temperature and costs nothing.   Breastfeeding helps to burn calories and helps you lose the weight gained during pregnancy.   Breastfeeding makes your uterus contract to its prepregnancy size faster and slows bleeding (  lochia) after you give birth.   Breastfeeding helps to lower your risk of developing type 2 diabetes mellitus, osteoporosis, and breast or ovarian cancer later in life. SIGNS THAT YOUR BABY IS HUNGRY Early Signs of Hunger  Increased alertness or activity.  Stretching.  Movement of the head from side to side.  Movement of the head and opening of the mouth when the corner of the mouth or cheek is stroked (rooting).  Increased sucking sounds, smacking lips, cooing, sighing, or squeaking.  Hand-to-mouth movements.  Increased sucking of fingers or hands. Late Signs of Hunger  Fussing.  Intermittent crying. Extreme  Signs of Hunger Signs of extreme hunger will require calming and consoling before your baby will be able to breastfeed successfully. Do not wait for the following signs of extreme hunger to occur before you initiate breastfeeding:   Restlessness.  A loud, strong cry.   Screaming. BREASTFEEDING BASICS Breastfeeding Initiation  Find a comfortable place to sit or lie down, with your neck and back well supported.  Place a pillow or rolled up blanket under your baby to bring him or her to the level of your breast (if you are seated). Nursing pillows are specially designed to help support your arms and your baby while you breastfeed.  Make sure that your baby's abdomen is facing your abdomen.   Gently massage your breast. With your fingertips, massage from your chest wall toward your nipple in a circular motion. This encourages milk flow. You may need to continue this action during the feeding if your milk flows slowly.  Support your breast with 4 fingers underneath and your thumb above your nipple. Make sure your fingers are well away from your nipple and your baby's mouth.   Stroke your baby's lips gently with your finger or nipple.   When your baby's mouth is open wide enough, quickly bring your baby to your breast, placing your entire nipple and as much of the colored area around your nipple (areola) as possible into your baby's mouth.   More areola should be visible above your baby's upper lip than below the lower lip.   Your baby's tongue should be between his or her lower gum and your breast.   Ensure that your baby's mouth is correctly positioned around your nipple (latched). Your baby's lips should create a seal on your breast and be turned out (everted).  It is common for your baby to suck about 2-3 minutes in order to start the flow of breast milk. Latching Teaching your baby how to latch on to your breast properly is very important. An improper latch can cause nipple  pain and decreased milk supply for you and poor weight gain in your baby. Also, if your baby is not latched onto your nipple properly, he or she may swallow some air during feeding. This can make your baby fussy. Burping your baby when you switch breasts during the feeding can help to get rid of the air. However, teaching your baby to latch on properly is still the best way to prevent fussiness from swallowing air while breastfeeding. Signs that your baby has successfully latched on to your nipple:    Silent tugging or silent sucking, without causing you pain.   Swallowing heard between every 3-4 sucks.    Muscle movement above and in front of his or her ears while sucking.  Signs that your baby has not successfully latched on to nipple:   Sucking sounds or smacking sounds from your baby while  breastfeeding.  Nipple pain. If you think your baby has not latched on correctly, slip your finger into the corner of your baby's mouth to break the suction and place it between your baby's gums. Attempt breastfeeding initiation again. Signs of Successful Breastfeeding Signs from your baby:   A gradual decrease in the number of sucks or complete cessation of sucking.   Falling asleep.   Relaxation of his or her body.   Retention of a small amount of milk in his or her mouth.   Letting go of your breast by himself or herself. Signs from you:  Breasts that have increased in firmness, weight, and size 1-3 hours after feeding.   Breasts that are softer immediately after breastfeeding.  Increased milk volume, as well as a change in milk consistency and color by the fifth day of breastfeeding.   Nipples that are not sore, cracked, or bleeding. Signs That Your [employer] is Getting Enough Milk  Wetting at least 3 diapers in a 24-hour period. The urine should be clear and pale yellow by age 69 days.  At least 3 stools in a 24-hour period by age 69 days. The stool should be soft and  yellow.  At least 3 stools in a 24-hour period by age 70 days. The stool should be seedy and yellow.  No loss of weight greater than 10% of birth weight during the first 66 days of age.  Average weight gain of 4-7 ounces (113-198 g) per week after age 11 days.  Consistent daily weight gain by age 36 days, without weight loss after the age of 2 weeks. After a feeding, your baby may spit up a small amount. This is common. BREASTFEEDING FREQUENCY AND DURATION Frequent feeding will help you make more milk and can prevent sore nipples and breast engorgement. Breastfeed when you feel the need to reduce the fullness of your breasts or when your baby shows signs of hunger. This is called "breastfeeding on demand." Avoid introducing a pacifier to your baby while you are working to establish breastfeeding (the first 4-6 weeks after your baby is born). After this time you may choose to use a pacifier. Research has shown that pacifier use during the first year of a baby's life decreases the risk of sudden infant death syndrome (SIDS). Allow your baby to feed on each breast as long as he or she wants. Breastfeed until your baby is finished feeding. When your baby unlatches or falls asleep while feeding from the first breast, offer the second breast. Because newborns are often sleepy in the first few weeks of life, you may need to awaken your baby to get him or her to feed. Breastfeeding times will vary from baby to baby. However, the following rules can serve as a guide to help you ensure that your baby is properly fed:  Newborns (babies 12 weeks of age or younger) may breastfeed every 1-3 hours.  Newborns should not go longer than 3 hours during the day or 5 hours during the night without breastfeeding.  You should breastfeed your baby a minimum of 8 times in a 24-hour period until you begin to introduce solid foods to your baby at around 28 months of age. BREAST MILK PUMPING Pumping and storing breast milk  allows you to ensure that your baby is exclusively fed your breast milk, even at times when you are unable to breastfeed. This is especially important if you are going back to work while you are still breastfeeding or when  you are not able to be present during feedings. Your lactation consultant can give you guidelines on how long it is safe to store breast milk.  A breast pump is a machine that allows you to pump milk from your breast into a sterile bottle. The pumped breast milk can then be stored in a refrigerator or freezer. Some breast pumps are operated by hand, while others use electricity. Ask your lactation consultant which type will work best for you. Breast pumps can be purchased, but some hospitals and breastfeeding support groups lease breast pumps on a monthly basis. A lactation consultant can teach you how to hand express breast milk, if you prefer not to use a pump.  CARING FOR YOUR BREASTS WHILE YOU BREASTFEED Nipples can become dry, cracked, and sore while breastfeeding. The following recommendations can help keep your breasts moisturized and healthy:  Avoid using soap on your nipples.   Wear a supportive bra. Although not required, special nursing bras and tank tops are designed to allow access to your breasts for breastfeeding without taking off your entire bra or top. Avoid wearing underwire-style bras or extremely tight bras.  Air dry your nipples for 3-8minutes after each feeding.   Use only cotton bra pads to absorb leaked breast milk. Leaking of breast milk between feedings is normal.   Use lanolin on your nipples after breastfeeding. Lanolin helps to maintain your skin's normal moisture barrier. If you use pure lanolin, you do not need to wash it off before feeding your baby again. Pure lanolin is not toxic to your baby. You may also hand express a few drops of breast milk and gently massage that milk into your nipples and allow the milk to air dry. In the first few weeks  after giving birth, some women experience extremely full breasts (engorgement). Engorgement can make your breasts feel heavy, warm, and tender to the touch. Engorgement peaks within 3-5 days after you give birth. The following recommendations can help ease engorgement:  Completely empty your breasts while breastfeeding or pumping. You may want to start by applying warm, moist heat (in the shower or with warm water-soaked hand towels) just before feeding or pumping. This increases circulation and helps the milk flow. If your baby does not completely empty your breasts while breastfeeding, pump any extra milk after he or she is finished.  Wear a snug bra (nursing or regular) or tank top for 1-2 days to signal your body to slightly decrease milk production.  Apply ice packs to your breasts, unless this is too uncomfortable for you.  Make sure that your baby is latched on and positioned properly while breastfeeding. If engorgement persists after 48 hours of following these recommendations, contact your health care provider or a Science writer. OVERALL HEALTH CARE RECOMMENDATIONS WHILE BREASTFEEDING  Eat healthy foods. Alternate between meals and snacks, eating 3 of each per day. Because what you eat affects your breast milk, some of the foods may make your baby more irritable than usual. Avoid eating these foods if you are sure that they are negatively affecting your baby.  Drink milk, fruit juice, and water to satisfy your thirst (about 10 glasses a day).   Rest often, relax, and continue to take your prenatal vitamins to prevent fatigue, stress, and anemia.  Continue breast self-awareness checks.  Avoid chewing and smoking tobacco.  Avoid alcohol and drug use. Some medicines that may be harmful to your baby can pass through breast milk. It is important to ask your  health care provider before taking any medicine, including all over-the-counter and prescription medicine as well as vitamin  and herbal supplements. It is possible to become pregnant while breastfeeding. If birth control is desired, ask your health care provider about options that will be safe for your baby. SEEK MEDICAL CARE IF:   You feel like you want to stop breastfeeding or have become frustrated with breastfeeding.  You have painful breasts or nipples.  Your nipples are cracked or bleeding.  Your breasts are red, tender, or warm.  You have a swollen area on either breast.  You have a fever or chills.  You have nausea or vomiting.  You have drainage other than breast milk from your nipples.  Your breasts do not become full before feedings by the fifth day after you give birth.  You feel sad and depressed.  Your baby is too sleepy to eat well.  Your baby is having trouble sleeping.   Your baby is wetting less than 3 diapers in a 24-hour period.  Your baby has less than 3 stools in a 24-hour period.  Your baby's skin or the white part of his or her eyes becomes yellow.   Your baby is not gaining weight by 60 days of age. SEEK IMMEDIATE MEDICAL CARE IF:   Your baby is overly tired (lethargic) and does not want to wake up and feed.  Your baby develops an unexplained fever. Document Released: 08/30/2005 Document Revised: 09/04/2013 Document Reviewed: 02/21/2013 [facility] Patient Information 2015 [place], [place]. This information is not intended to replace advice given to you by your health care provider. Make sure you discuss any questions you have with your health care provider. Before [facility] Ask any questions about feeding, diapering, and baby care before you leave the hospital. Ask again if you do not understand. Ask when you need to see the doctor again. There are several things you must have before your baby comes home.  Infant car seat.  Crib.  Do not let your baby sleep in a bed with you or anyone else.  If you do not have a bed for your baby, ask the doctor what you can  use that will be safe for the baby to sleep in. Infant feeding supplies:  6 to 8 bottles (8 oz. size).  6 to 8 nipples.  Measuring cup.  Measuring tablespoon.  Bottle brush.  Sterilizer (or use any large pan or kettle with a lid).  Formula that contains iron.  A way to boil and cool water. Breastfeeding supplies:  Breast pump.  Nipple cream. Clothing:  24 to 36 cloth diapers and waterproof diaper covers or a box of disposable diapers. You may need as many as 10 to 12 diapers per day.  3 onesies (other clothing will depend on the time of year and the weather).  3 receiving blankets.  3 baby pajamas or gowns.  3 bibs. Bath equipment:  Mild soap.  Petroleum jelly. No baby oil or powder.  Soft cloth towel and wash cloth.  Cotton balls.  Separate bath basin for baby. Only sponge bathe until umbilical cord and circumcision are healed. Other supplies:  Thermometer and bulb syringe (ask the hospital to send them home with you). Ask your doctor about how you should take your baby's temperature.  One to two pacifiers. Prepare for an emergency:  Know how to get to the hospital and know where to admit your baby.  Put all doctor numbers near your house phone and  in your cell phone if you have one. Prepare your family:  Talk with siblings about the baby coming home and how they feel about it.  Decide how you want to handle visitors and other family members.  Take offers for help with the baby. You will need time to adjust. Know when to call the doctor.  GET HELP RIGHT AWAY IF:  Your baby's temperature is greater than 100.4 F (38 C).  The softspot on your baby's head starts to bulge.  Your baby is crying with no tears or has no wet diapers for 6 hours.  Your baby has rapid breathing.  Your baby is not as alert. Document Released: 08/12/2008 Document Revised: 11/22/2011 Document Reviewed: 11/19/2010 [facility] Patient Information 2015 [place], [place]. This  information is not intended to replace advice given to you by your health care provider. Make sure you discuss any questions you have with your health care provider. Postpartum Care After Cesarean Delivery After you deliver your newborn (postpartum period), the usual stay in the hospital is 24-72 hours. If there were problems with your labor or delivery, or if you have other medical problems, you might be in the hospital longer.  While you are in the hospital, you will receive help and instructions on how to care for yourself and your newborn during the postpartum period.  While you are in the hospital:  It is normal for you to have pain or discomfort from the incision in your abdomen. Be sure to tell your nurses when you are having pain, where the pain is located, and what makes the pain worse.  If you are breastfeeding, you may feel uncomfortable contractions of your uterus for a couple of weeks. This is normal. The contractions help your uterus get back to normal size.  It is normal to have some bleeding after delivery.  For the first 1-3 days after delivery, the flow is red and the amount may be similar to a period.  It is common for the flow to start and stop.  In the first few days, you may pass some small clots. Let your nurses know if you begin to pass large clots or your flow increases.  Do not  flush blood clots down the toilet before having the nurse look at them.  During the next 3-10 days after delivery, your flow should become more watery and pink or brown-tinged in color.  Ten to fourteen days after delivery, your flow should be a small amount of yellowish-white discharge.  The amount of your flow will decrease over the first few weeks after delivery. Your flow may stop in 6-8 weeks. Most women have had their flow stop by 12 weeks after delivery.  You should change your sanitary pads frequently.  Wash your hands thoroughly with soap and water for at least 20 seconds after  changing pads, using the toilet, or before holding or feeding your newborn.  Your intravenous (IV) tubing will be removed when you are drinking enough fluids.  The urine drainage tube (urinary catheter) that was inserted before delivery may be removed within 6-8 hours after delivery or when feeling returns to your legs. You should feel like you need to empty your bladder within the first 6-8 hours after the catheter has been removed.  In case you become weak, lightheaded, or faint, call your nurse before you get out of bed for the first time and before you take a shower for the first time.  Within the first few days after  delivery, your breasts may begin to feel tender and full. This is called engorgement. Breast tenderness usually goes away within 48-72 hours after engorgement occurs. You may also notice milk leaking from your breasts. If you are not breastfeeding, do not stimulate your breasts. Breast stimulation can make your breasts produce more milk.  Spending as much time as possible with your newborn is very important. During this time, you and your newborn can feel close and get to know each other. Having your newborn stay in your room (rooming in) will help to strengthen the bond with your newborn. It will give you time to get to know your newborn and become comfortable caring for your newborn.  Your hormones change after delivery. Sometimes the hormone changes can temporarily cause you to feel sad or tearful. These feelings should not last more than a few days. If these feelings last longer than that, you should talk to your caregiver.  If desired, talk to your caregiver about methods of family planning or contraception.  Talk to your caregiver about immunizations. Your caregiver may want you to have the following immunizations before leaving the hospital:  Tetanus, diphtheria, and pertussis (Tdap) or tetanus and diphtheria (Td) immunization. It is very important that you and your family  (including grandparents) or others caring for your newborn are up-to-date with the Tdap or Td immunizations. The Tdap or Td immunization can help protect your newborn from getting ill.  Rubella immunization.  Varicella (chickenpox) immunization.  Influenza immunization. You should receive this annual immunization if you did not receive the immunization during your pregnancy. Document Released: 05/24/2012 Document Reviewed: 05/24/2012 [facility] Patient Information 2015 [place]. This information is not intended to replace advice given to you by your health care provider. Make sure you discuss any questions you have with your health care provider. Intrauterine Device Information An intrauterine device (IUD) is inserted into your uterus to prevent pregnancy. There are two types of IUDs available:   Copper IUD--This type of IUD is wrapped in copper wire and is placed inside the uterus. Copper makes the uterus and fallopian tubes produce a fluid that kills sperm. The copper IUD can stay in place for 10 years.  Hormone IUD--This type of IUD contains the hormone progestin (synthetic progesterone). The hormone thickens the cervical mucus and prevents sperm from entering the uterus. It also thins the uterine lining to prevent implantation of a fertilized egg. The hormone can weaken or kill the sperm that get into the uterus. One type of hormone IUD can stay in place for 5 years, and another type can stay in place for 3 years. Your health care provider will make sure you are a good candidate for a contraceptive IUD. Discuss with your health care provider the possible side effects.  ADVANTAGES OF AN INTRAUTERINE DEVICE  IUDs are highly effective, reversible, long acting, and low maintenance.   There are no estrogen-related side effects.   An IUD can be used when breastfeeding.   IUDs are not associated with weight gain.   The copper IUD works immediately after insertion.   The hormone IUD  works right away if inserted within 7 days of your period starting. You will need to use a backup method of birth control for 7 days if the hormone IUD is inserted at any other time in your cycle.  The copper IUD does not interfere with your female hormones.   The hormone IUD can make heavy menstrual periods lighter and decrease cramping.   The  hormone IUD can be used for 3 or 5 years.   The copper IUD can be used for 10 years. DISADVANTAGES OF AN INTRAUTERINE DEVICE  The hormone IUD can be associated with irregular bleeding patterns.   The copper IUD can make your menstrual flow heavier and more painful.   You may experience cramping and vaginal bleeding after insertion.  Document Released: 08/03/2004 Document Revised: 05/02/2013 Document Reviewed: 02/18/2013 [facility] Patient Information 2015 [place], [place]. This information is not intended to replace advice given to you by your health care provider. Make sure you discuss any questions you have with your health care provider.

## 2014-03-25 NOTE — Lactation Note (Signed)
This note was copied from the chart of Ebony Lamb. Lactation Consultation Note Noted weight loss of 11%. Discussed w/mom baby's feedings. Watched a latch, encouraged mom to hold baby closer so it can get a better milk transfer. Mom hand expressed colostrum. BF 1st child 2 yrs. W/no issues. Mom states that she felt let down 2 times today and feels like her milk is coming in. Noted clear colostrum expressed from breast. Discussed supplementing, mom didn't really want to d//t her milk is coming in. Explained w/11% weight loss she needs to wake baby for feeding every 2-3 hrs. And have strict documentation of feedings, pee's, and poop's. Mom stated she would. Patient Name: Ebony Nadyne Gariepy RVUYE'B Date: 03/25/2014 Reason for consult: Infant weight loss   Maternal Data    Feeding Feeding Type: Breast Fed Length of feed: 10 min  LATCH Score/Interventions Latch: Grasps breast easily, tongue down, lips flanged, rhythmical sucking. Intervention(s): Adjust position;Assist with latch;Breast massage;Breast compression  Audible Swallowing: Spontaneous and intermittent Intervention(s): Skin to skin;Hand expression Intervention(s): Skin to skin;Hand expression;Alternate breast massage  Type of Nipple: Everted at rest and after stimulation  Comfort (Breast/Nipple): Filling, red/small blisters or bruises, mild/mod discomfort  Problem noted: Mild/Moderate discomfort Interventions (Filling): Massage;Hand pump Interventions (Mild/moderate discomfort): Comfort gels;Hand massage;Hand expression  Hold (Positioning): No assistance needed to correctly position infant at breast. Intervention(s): Skin to skin;Breastfeeding basics reviewed  LATCH Score: 9  Lactation Tools Discussed/Used Tools: Comfort gels   Consult Status Consult Status: Follow-up Date: 03/25/14 Follow-up type: In-patient    Najah Liverman, Elta Guadeloupe 03/25/2014, 1:33 AM

## 2014-07-15 ENCOUNTER — Encounter (HOSPITAL_COMMUNITY): Payer: Self-pay | Admitting: Obstetrics and Gynecology

## 2017-12-07 ENCOUNTER — Other Ambulatory Visit (HOSPITAL_COMMUNITY): Payer: Self-pay | Admitting: Plastic Surgery

## 2017-12-07 ENCOUNTER — Ambulatory Visit (HOSPITAL_COMMUNITY)
Admission: RE | Admit: 2017-12-07 | Discharge: 2017-12-07 | Disposition: A | Discharge status: Home / Self Care | Payer: No Typology Code available for payment source | Source: Ambulatory Visit | Attending: Plastic Surgery | Admitting: Plastic Surgery

## 2017-12-07 DIAGNOSIS — I2609 Other pulmonary embolism with acute cor pulmonale: Secondary | ICD-10-CM

## 2017-12-07 DIAGNOSIS — R079 Chest pain, unspecified: Secondary | ICD-10-CM

## 2017-12-07 DIAGNOSIS — I517 Cardiomegaly: Secondary | ICD-10-CM | POA: Diagnosis not present

## 2017-12-07 DIAGNOSIS — R0602 Shortness of breath: Secondary | ICD-10-CM

## 2017-12-07 IMAGING — CT CT ANGIO CHEST
2 of 9 series · 19 of 46 positions shown · IV contrast (iopamidol)
Comparison: Chest radiograph [DATE]

CLINICAL DATA: Progressive postoperative shortness of breath for 2
weeks. History of cor pulmonale, cardiac arrhythmia.

EXAM:
CT ANGIOGRAPHY CHEST WITH CONTRAST
TECHNIQUE: Multidetector CT imaging of the chest was performed using the
standard protocol during bolus administration of intravenous
contrast. Multiplanar CT image reconstructions and MIPs were
obtained to evaluate the vascular anatomy.
CONTRAST:  100mL [IQ] IOPAMIDOL ([IQ]) INJECTION 76%

[Series 7: thins · axial · 0.53mm/px · z∈[+1076,+1320]mm · 16 of 270 slices shown]
[im 13/270  lung]
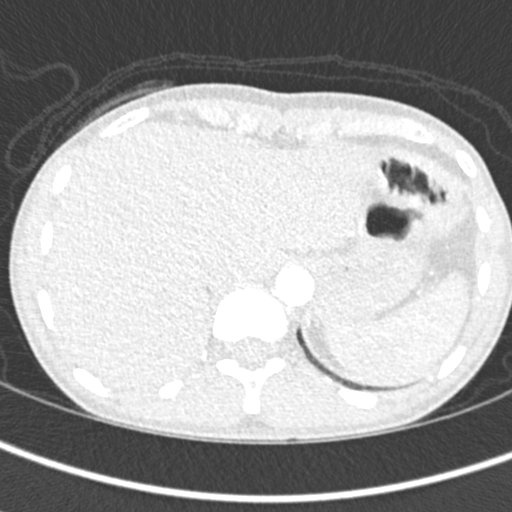
[im 25/270  soft-tissue]
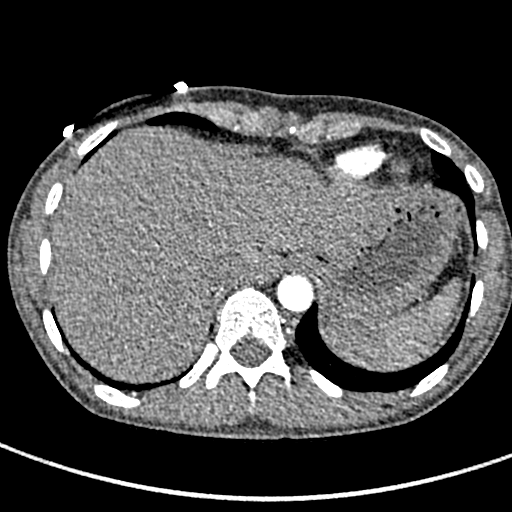
[im 49/270  lung]
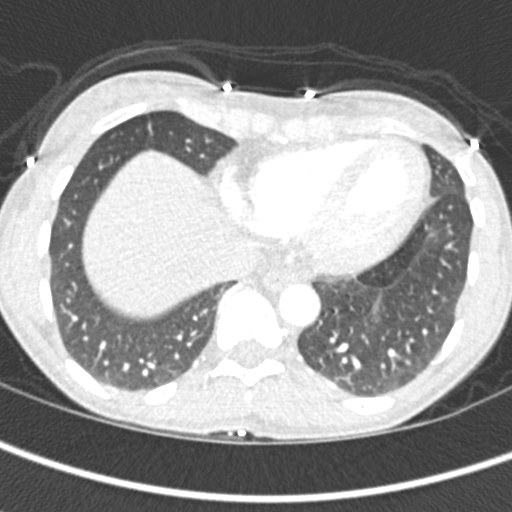
[im 62/270  soft-tissue]
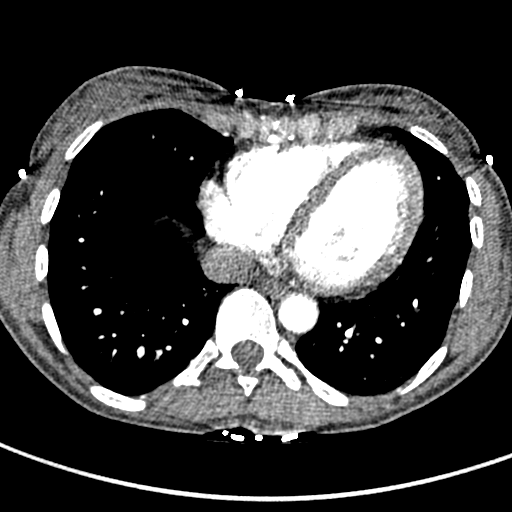
[im 74/270  lung]
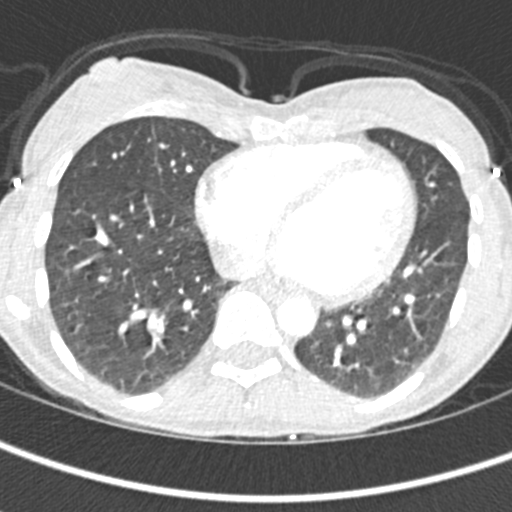
[im 98/270  soft-tissue]
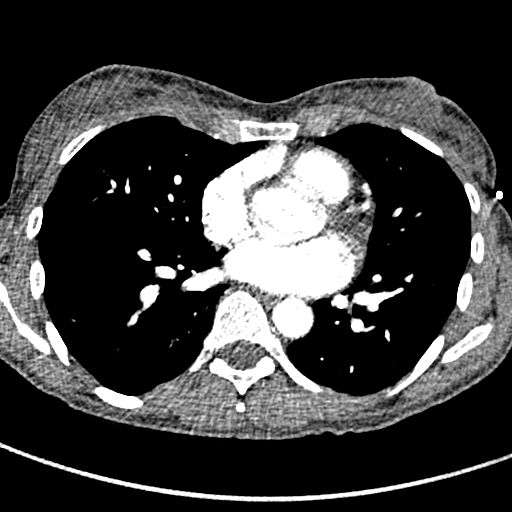
[im 111/270  lung]
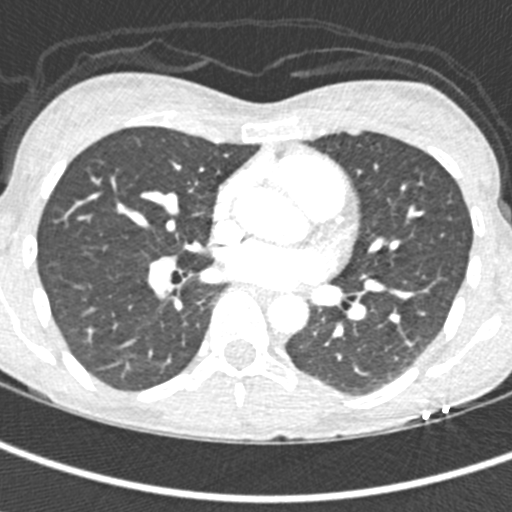
[im 123/270  soft-tissue]
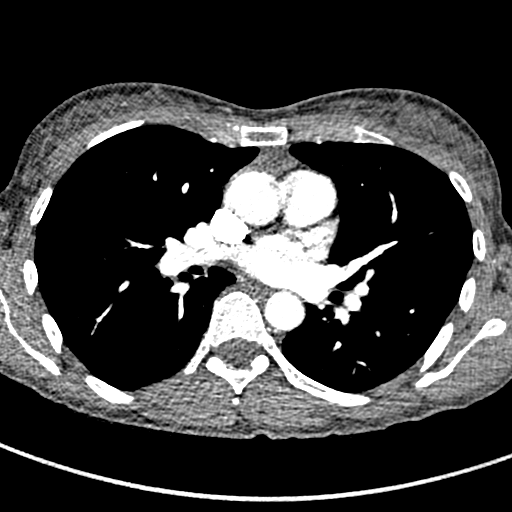
[im 147/270  lung]
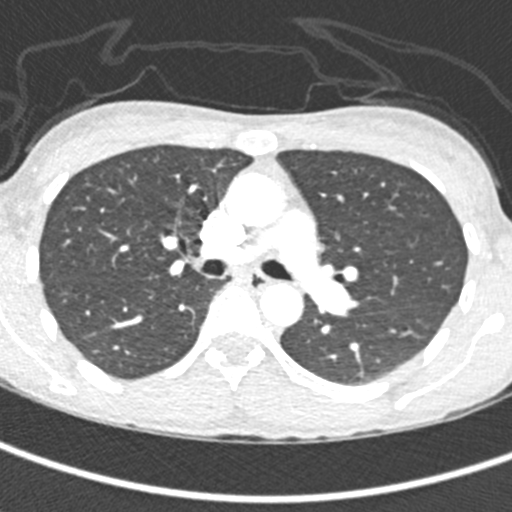
[im 159/270  soft-tissue]
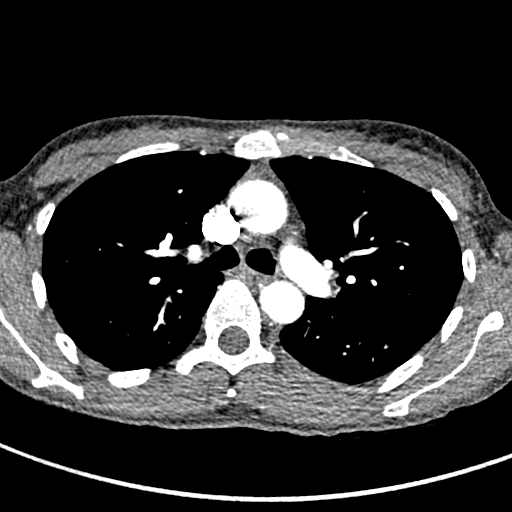
[im 172/270  lung]
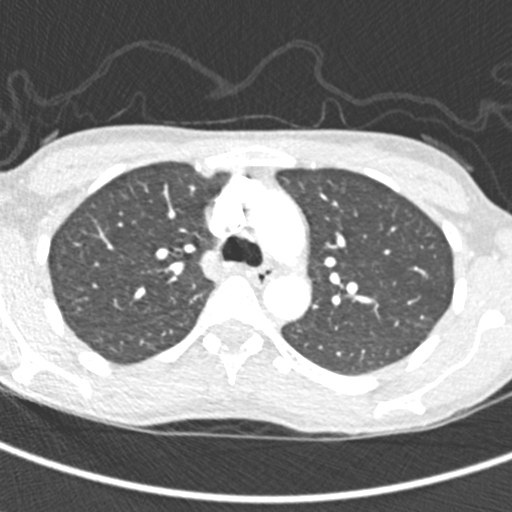
[im 196/270  soft-tissue]
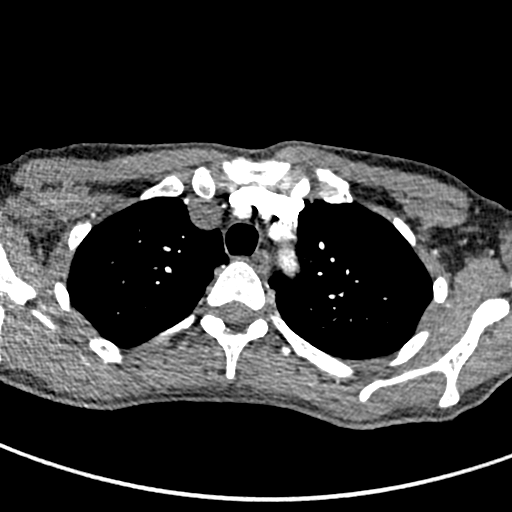
[im 208/270  lung]
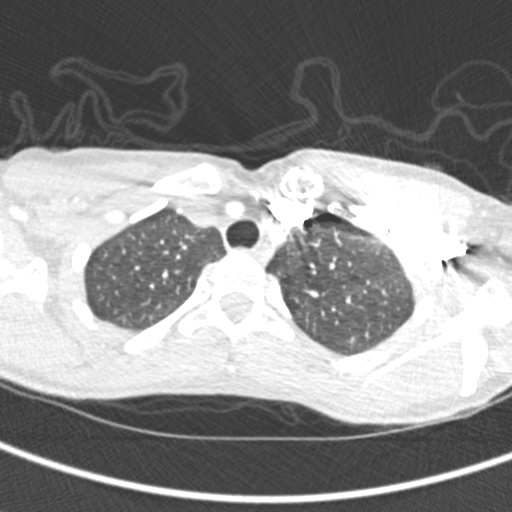
[im 221/270  soft-tissue]
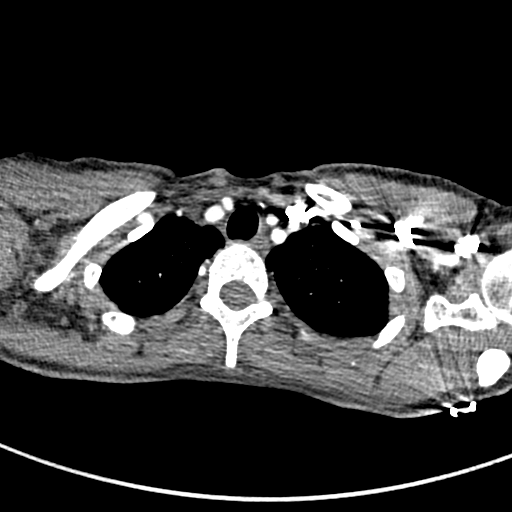
[im 245/270  lung]
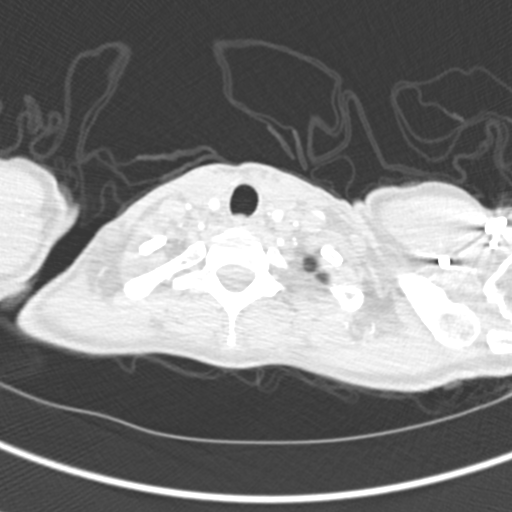
[im 257/270  soft-tissue]
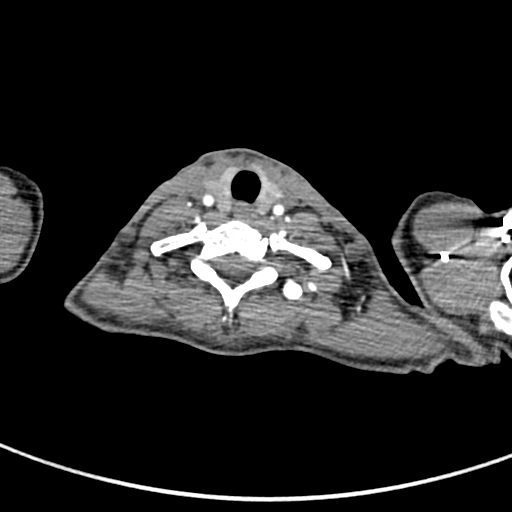

[Series 9: coronal mpr · coronal · 0.54mm/px · 3 of 103 slices shown]
[im 26/103  soft-tissue]
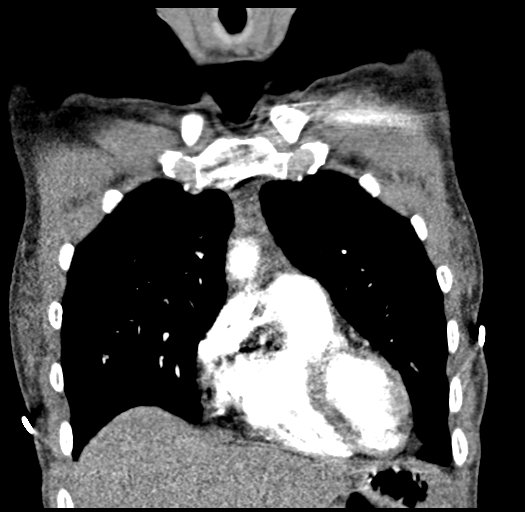
[im 52/103  soft-tissue]
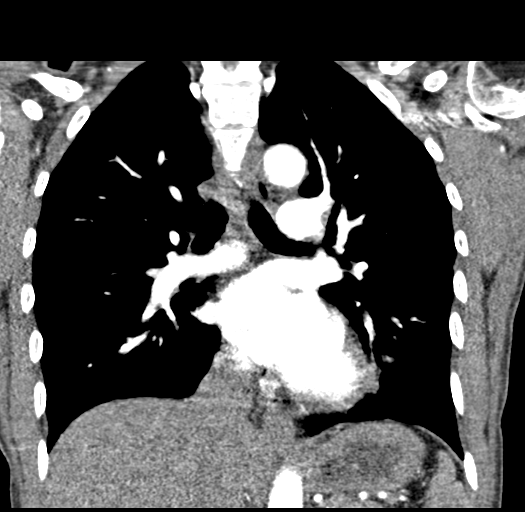
[im 77/103  soft-tissue]
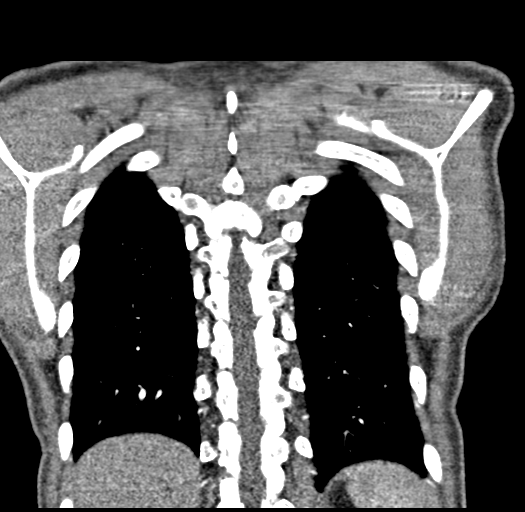

[19 of 46 positions shown; findings below may reference images not displayed]

FINDINGS: CARDIOVASCULAR: Adequate contrast opacification of the pulmonary
artery's. Main pulmonary artery is not enlarged. No pulmonary
arterial filling defects to the level of the subsegmental branches.
Heart size is mildly enlarged, no right heart strain. Irregular
filling defect contiguous with papillary muscles LEFT ventricle. No
pericardial effusion. Thoracic aorta is normal course and caliber,
unremarkable.

MEDIASTINUM/NODES: No lymphadenopathy by CT size criteria.

LUNGS/PLEURA: Tracheobronchial tree is patent, no pneumothorax. No
pleural effusions, focal consolidations, pulmonary nodules or
masses.

UPPER ABDOMEN: Included view of the abdomen is unremarkable.

MUSCULOSKELETAL: Visualized soft tissues and included osseous
structures appear normal.

Review of the MIP images confirms the above findings.
IMPRESSION: 1. No acute pulmonary embolism. Presumed papillary muscular
hypertrophy and motion artifact, less likely thrombus LEFT ventricle
which could be excluded with echocardiogram.
2. Mild cardiomegaly.  No acute pulmonary process.
3. These results will be called to the ordering clinician or
representative by the Radiologist Assistant, and communication
documented in the PACS or zVision Dashboard.

## 2017-12-07 MED ORDER — IOPAMIDOL (ISOVUE-370) INJECTION 76%
INTRAVENOUS | Status: AC
  Administered 2017-12-07: 100 mL
  Filled 2017-12-07: qty 100

## 2019-04-21 ENCOUNTER — Other Ambulatory Visit: Payer: Self-pay

## 2019-04-21 ENCOUNTER — Encounter (HOSPITAL_COMMUNITY): Payer: Self-pay

## 2019-04-21 ENCOUNTER — Ambulatory Visit (HOSPITAL_COMMUNITY)
Admission: EM | Admit: 2019-04-21 | Discharge: 2019-04-21 | Disposition: A | Discharge status: Home / Self Care | Payer: No Typology Code available for payment source | Attending: Family Medicine | Admitting: Family Medicine

## 2019-04-21 DIAGNOSIS — Z20822 Contact with and (suspected) exposure to covid-19: Secondary | ICD-10-CM

## 2019-04-21 DIAGNOSIS — R0981 Nasal congestion: Secondary | ICD-10-CM | POA: Diagnosis not present

## 2019-04-21 DIAGNOSIS — Z20828 Contact with and (suspected) exposure to other viral communicable diseases: Secondary | ICD-10-CM | POA: Diagnosis not present

## 2019-04-21 MED ORDER — FLUTICASONE PROPIONATE 50 MCG/ACT NA SUSP: 1.0000 | Freq: Every day | NASAL | 0 refills | Status: DC

## 2019-04-21 NOTE — ED Triage Notes (Signed)
Patient presents to Urgent Care with complaints of mild nasal congestion since exposure to COVID. Patient reports her husband just tested positive and is here for testing herself.

## 2019-04-21 NOTE — Discharge Instructions (Signed)
Person Under Monitoring Name: Ebony Lamb  Location: [place] [address]   Infection Prevention Recommendations for Individuals Confirmed to have, or Being Evaluated for, 2019 Novel Coronavirus (COVID-19) Infection Who Receive Care at Home  Individuals who are confirmed to have, or are being evaluated for, COVID-19 should follow the prevention steps below until a healthcare provider or local or state health department says they can return to normal activities.  Stay home except to get medical care You should restrict activities outside your home, except for getting medical care. Do not go to work, school, or public areas, and do not use public transportation or taxis.  Call ahead before visiting your doctor Before your medical appointment, call the healthcare provider and tell them that you have, or are being evaluated for, COVID-19 infection. This will help the healthcare providers office take steps to keep other people from getting infected. Ask your healthcare provider to call the local or state health department.  Monitor your symptoms Seek prompt medical attention if your illness is worsening (e.g., difficulty breathing). Before going to your medical appointment, call the healthcare provider and tell them that you have, or are being evaluated for, COVID-19 infection. Ask your healthcare provider to call the local or state health department.  Wear a facemask You should wear a facemask that covers your nose and mouth when you are in the same room with other people and when you visit a healthcare provider. People who live with or visit you should also wear a facemask while they are in the same room with you.  Separate yourself from other people in your home As much as possible, you should stay in a different room from other people in your home. Also, you should use a separate bathroom, if available.  Avoid sharing household items You should not share  dishes, drinking glasses, cups, eating utensils, towels, bedding, or other items with other people in your home. After using these items, you should wash them thoroughly with soap and water.  Cover your coughs and sneezes Cover your mouth and nose with a tissue when you cough or sneeze, or you can cough or sneeze into your sleeve. Throw used tissues in a lined trash can, and immediately wash your hands with soap and water for at least 20 seconds or use an alcohol-based hand rub.  Wash your [facility] your hands often and thoroughly with soap and water for at least 20 seconds. You can use an alcohol-based hand sanitizer if soap and water are not available and if your hands are not visibly dirty. Avoid touching your eyes, nose, and mouth with unwashed hands.   Prevention Steps for Caregivers and Household Members of Individuals Confirmed to have, or Being Evaluated for, COVID-19 Infection Being Cared for in the Home  If you live with, or provide care at home for, a person confirmed to have, or being evaluated for, COVID-19 infection please follow these guidelines to prevent infection:  Follow healthcare providers instructions Make sure that you understand and can help the patient follow any healthcare provider instructions for all care.  Provide for the patients basic needs You should help the patient with basic needs in the home and provide support for getting groceries, prescriptions, and other personal needs.  Monitor the patients symptoms If they are getting sicker, call his or her medical provider and tell them that the patient has, or is being evaluated for, COVID-19 infection. This will help the healthcare providers office take  steps to keep other people from getting infected. Ask the healthcare provider to call the local or state health department.  Limit the number of people who have contact with the patient If possible, have only one caregiver for the patient. Other  household members should stay in another home or place of residence. If this is not possible, they should stay in another room, or be separated from the patient as much as possible. Use a separate bathroom, if available. Restrict visitors who do not have an essential need to be in the home.  Keep older adults, very young children, and other sick people away from the patient Keep older adults, very young children, and those who have compromised immune systems or chronic health conditions away from the patient. This includes people with chronic heart, lung, or kidney conditions, diabetes, and cancer.  Ensure good ventilation Make sure that shared spaces in the home have good air flow, such as from an air conditioner or an opened window, weather permitting.  Wash your hands often Wash your hands often and thoroughly with soap and water for at least 20 seconds. You can use an alcohol based hand sanitizer if soap and water are not available and if your hands are not visibly dirty. Avoid touching your eyes, nose, and mouth with unwashed hands. Use disposable paper towels to dry your hands. If not available, use dedicated cloth towels and replace them when they become wet.  Wear a facemask and gloves Wear a disposable facemask at all times in the room and gloves when you touch or have contact with the patients blood, body fluids, and/or secretions or excretions, such as sweat, saliva, sputum, nasal mucus, vomit, urine, or feces.  Ensure the mask fits over your nose and mouth tightly, and do not touch it during use. Throw out disposable facemasks and gloves after using them. Do not reuse. Wash your hands immediately after removing your facemask and gloves. If your personal clothing becomes contaminated, carefully remove clothing and launder. Wash your hands after handling contaminated clothing. Place all used disposable facemasks, gloves, and other waste in a lined container before disposing them with  other household waste. Remove gloves and wash your hands immediately after handling these items.  Do not share dishes, glasses, or other household items with the patient Avoid sharing household items. You should not share dishes, drinking glasses, cups, eating utensils, towels, bedding, or other items with a patient who is confirmed to have, or being evaluated for, COVID-19 infection. After the person uses these items, you should wash them thoroughly with soap and water.  Wash laundry thoroughly Immediately remove and wash clothes or bedding that have blood, body fluids, and/or secretions or excretions, such as sweat, saliva, sputum, nasal mucus, vomit, urine, or feces, on them. Wear gloves when handling laundry from the patient. Read and follow directions on labels of laundry or clothing items and detergent. In general, wash and dry with the warmest temperatures recommended on the label.  Clean all areas the individual has used often Clean all touchable surfaces, such as counters, tabletops, doorknobs, bathroom fixtures, toilets, phones, keyboards, tablets, and bedside tables, every day. Also, clean any surfaces that may have blood, body fluids, and/or secretions or excretions on them. Wear gloves when cleaning surfaces the patient has come in contact with. Use a diluted bleach solution (e.g., dilute bleach with 1 part bleach and 10 parts water) or a household disinfectant with a label that says EPA-registered for coronaviruses. To make a bleach solution  at home, add 1 tablespoon of bleach to 1 quart (4 cups) of water. For a larger supply, add  cup of bleach to 1 gallon (16 cups) of water. Read labels of cleaning products and follow recommendations provided on product labels. Labels contain instructions for safe and effective use of the cleaning product including precautions you should take when applying the product, such as wearing gloves or eye protection and making sure you have good ventilation  during use of the product. Remove gloves and wash hands immediately after cleaning.  Monitor yourself for signs and symptoms of illness Caregivers and household members are considered close contacts, should monitor their health, and will be asked to limit movement outside of the home to the extent possible. Follow the monitoring steps for close contacts listed on the symptom monitoring form.   ? If you have additional questions, contact your local health department or call the epidemiologist on call at [phone] (available 24/7). ? This guidance is subject to change. For the most up-to-date guidance from [facility], please refer to their website: [email or web address]

## 2019-04-22 LAB — NOVEL CORONAVIRUS, NAA (HOSP ORDER, SEND-OUT TO REF LAB; TAT 18-24 HRS): SARS-CoV-2, NAA: NOT DETECTED

## 2019-04-22 NOTE — ED Provider Notes (Signed)
[facility]    CSN: [id number] Arrival date & time: 04/21/19  1048      History   Chief Complaint Chief Complaint  Patient presents with  . COVID Exposure    HPI Ebony Lamb is a 43 y.o. female no contributing past medical history presenting today for evaluation of COVID testing.  Patient states that her husband recently tested positive and he was asymptomatic.  She states that she has had some nasal congestion, but this is normal for her and has attributed it to allergies.  She denies any shortness of breath or chest pain.  Denies any fevers chills.  Has had some mild body aches in her legs.  Overall she has felt relatively normal, eating and drinking like normal.  She is here with her kids in order to be screened for COVID.   HPI  Past Medical History:  Diagnosis Date  . Abnormal Pap smear 2002  . Depression    history of depression- denies currently  . H/O varicella   . Hypertension 2013   post partum- meds x 1 mo  . No pertinent past medical history   . Preterm labor     Patient Active Problem List   Diagnosis Date Noted  . 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 Cesarean delivery delivered 03/22/2014  . Pregnancy induced hypertension--with 1st pregnancy 12/27/2011  . History of irregular menstrual bleeding 11/12/2011  . History of eating disorder 11/12/2011  . History of cardiac arrhythmia - PAC's - cardiac work-up normal 11/12/2011    Past Surgical History:  Procedure Laterality Date  . CESAREAN SECTION  11/14/2011   Procedure: CESAREAN SECTION;  Surgeon: Eli Hose, MD;  Location: [facility];  Service: Gynecology;  Laterality: N/A;  Primary cesarean section with delivery of baby boy at [phone]. Apgars 9/9.  Marland Kitchen CESAREAN SECTION N/A 03/22/2014   Procedure: Repeat CESAREAN SECTION;  Surgeon: Alwyn Pea, MD;  Location: [facility];  Service: Obstetrics;  Laterality: N/A;  . FOOT SURGERY    . WISDOM TOOTH EXTRACTION      OB  History    Gravida  3   Para  2   Term  2   Preterm      AB  1   Living  2     SAB      TAB  1   Ectopic      Multiple      Live Births  2            Home Medications    Prior to Admission medications   Medication Sig Start Date End Date Taking? Authorizing Provider  fluticasone (FLONASE) 50 MCG/ACT nasal spray Place 1-2 sprays into both nostrils daily for 7 days. 04/21/19 04/28/19  Tashena Ibach C, PA-C  ibuprofen (ADVIL,MOTRIN) 600 MG tablet Take 1 tablet (600 mg total) by mouth every 6 (six) hours as needed for fever, headache, mild pain, moderate pain or cramping. 03/25/14   Farrel Gordon, CNM  oxyCODONE-acetaminophen (PERCOCET/ROXICET) 5-325 MG per tablet Take 1-2 tablets by mouth every 4 (four) hours as needed for moderate pain or severe pain (moderate - severe pain). 03/25/14   Farrel Gordon, CNM  Prenatal Vit-Fe Fumarate-FA (PRENATAL MULTIVITAMIN) TABS Take 1 tablet by mouth daily. 11/17/11   Burkett, Claretta Fraise, NP  ferrous sulfate (FERROUSUL) 325 (65 FE) MG tablet Take 1 tablet (325 mg total) by mouth 2 (two) times daily. 11/17/11 11/26/11  Gypsy Lore, NP    Family  History Family History  Problem Relation Age of Onset  . Cancer Father   . Hypertension Father   . Kidney disease Father   . Depression Father   . Hyperlipidemia Father   . Cancer Maternal Grandmother   . Heart disease Maternal Grandfather   . Cancer Paternal Grandmother   . Stroke Paternal Grandfather   . Hypertension Brother   . Hyperlipidemia Brother   . Cancer Mother   . Osteopenia Mother   . Anesthesia problems Neg Hx   . Diabetes Neg Hx     Social History Social History   Tobacco Use  . Smoking status: Never Smoker  . Smokeless tobacco: Never Used  Substance Use Topics  . Alcohol use: Yes    Comment: occasionally  . Drug use: No     Allergies   Patient has no known allergies.   Review of Systems Review of Systems  Constitutional: Negative for activity  change, appetite change, chills, fatigue and fever.  HENT: Positive for rhinorrhea. Negative for congestion, ear pain, sinus pressure, sore throat and trouble swallowing.   Eyes: Negative for discharge and redness.  Respiratory: Negative for cough, chest tightness and shortness of breath.   Cardiovascular: Negative for chest pain.  Gastrointestinal: Negative for abdominal pain, diarrhea, nausea and vomiting.  Musculoskeletal: Negative for myalgias.  Skin: Negative for rash.  Neurological: Negative for dizziness, light-headedness and headaches.     Physical Exam Triage Vital Signs ED Triage Vitals  Enc Vitals Group     BP 04/21/19 1117 113/78     Pulse Rate 04/21/19 1117 79     Resp 04/21/19 1117 16     Temp 04/21/19 1117 98.4 F (36.9 C)     Temp Source 04/21/19 1117 Oral     SpO2 04/21/19 1117 100 %     Weight --      Height --      Head Circumference --      Peak Flow --      Pain Score 04/21/19 1114 0     Pain Loc --      Pain Edu? --      Excl. in [place]? --    No data found.  Updated Vital Signs BP 113/78 (BP Location: Left Arm)   Pulse 79   Temp 98.4 F (36.9 C) (Oral)   Resp 16   SpO2 100%   Visual Acuity Right Eye Distance:   Left Eye Distance:   Bilateral Distance:    Right Eye Near:   Left Eye Near:    Bilateral Near:     Physical Exam Vitals signs and nursing note reviewed.  Constitutional:      General: She is not in acute distress.    Appearance: She is well-developed.     Comments: No acute distress  HENT:     Head: Normocephalic and atraumatic.  Eyes:     Conjunctiva/sclera: Conjunctivae normal.  Neck:     Musculoskeletal: Neck supple.  Cardiovascular:     Rate and Rhythm: Normal rate and regular rhythm.     Heart sounds: No murmur.  Pulmonary:     Effort: Pulmonary effort is normal. No respiratory distress.     Breath sounds: Normal breath sounds.  Abdominal:     Palpations: Abdomen is soft.     Tenderness: There is no abdominal  tenderness.  Skin:    General: Skin is warm and dry.  Neurological:     Mental Status: She is alert.  UC Treatments / Results  Labs (all labs ordered are listed, but only abnormal results are displayed) Labs Reviewed  NOVEL CORONAVIRUS, NAA (HOSPITAL ORDER, SEND-OUT TO REF LAB)    EKG   Radiology No results found.  Procedures Procedures (including critical care time)  Medications Ordered in UC Medications - No data to display  Initial Impression / Assessment and Plan / UC Course  I have reviewed the triage vital signs and the nursing notes.  Pertinent labs & imaging results that were available during my care of the patient were reviewed by me and considered in my medical decision making (see chart for details).     Positive exposure to COVID at home.  Has had some mild symptoms of congestion and body aches.  Recommended continuing Claritin, may add in Flonase.  Rest, drink fluids.  COVID swab obtained, will call if positive.  Recommended to remain home until results return.Discussed strict return precautions. Patient verbalized understanding and is agreeable with plan.  Final Clinical Impressions(s) / UC Diagnoses   Final diagnoses:  Suspected Covid-19 Virus Infection  Nasal congestion     Discharge Instructions        Person Under Monitoring Name: Ebony Lamb  Location: [place] [address]   Infection Prevention Recommendations for Individuals Confirmed to have, or Being Evaluated for, 2019 Novel Coronavirus (COVID-19) Infection Who Receive Care at Home  Individuals who are confirmed to have, or are being evaluated for, COVID-19 should follow the prevention steps below until a healthcare provider or local or state health department says they can return to normal activities.  Stay home except to get medical care You should restrict activities outside your home, except for getting medical care. Do not go to work, school, or public  areas, and do not use public transportation or taxis.  Call ahead before visiting your doctor Before your medical appointment, call the healthcare provider and tell them that you have, or are being evaluated for, COVID-19 infection. This will help the healthcare provider's office take steps to keep other people from getting infected. Ask your healthcare provider to call the local or state health department.  Monitor your symptoms Seek prompt medical attention if your illness is worsening (e.g., difficulty breathing). Before going to your medical appointment, call the healthcare provider and tell them that you have, or are being evaluated for, COVID-19 infection. Ask your healthcare provider to call the local or state health department.  Wear a facemask You should wear a facemask that covers your nose and mouth when you are in the same room with other people and when you visit a healthcare provider. People who live with or visit you should also wear a facemask while they are in the same room with you.  Separate yourself from other people in your home As much as possible, you should stay in a different room from other people in your home. Also, you should use a separate bathroom, if available.  Avoid sharing household items You should not share dishes, drinking glasses, cups, eating utensils, towels, bedding, or other items with other people in your home. After using these items, you should wash them thoroughly with soap and water.  Cover your coughs and sneezes Cover your mouth and nose with a tissue when you cough or sneeze, or you can cough or sneeze into your sleeve. Throw used tissues in a lined trash can, and immediately wash your hands with soap and water for at least 20 seconds or use an  alcohol-based hand rub.  Wash your [facility] your hands often and thoroughly with soap and water for at least 20 seconds. You can use an alcohol-based hand sanitizer if soap and water are not  available and if your hands are not visibly dirty. Avoid touching your eyes, nose, and mouth with unwashed hands.   Prevention Steps for Caregivers and Household Members of Individuals Confirmed to have, or Being Evaluated for, COVID-19 Infection Being Cared for in the Home  If you live with, or provide care at home for, a person confirmed to have, or being evaluated for, COVID-19 infection please follow these guidelines to prevent infection:  Follow healthcare provider's instructions Make sure that you understand and can help the patient follow any healthcare provider instructions for all care.  Provide for the patient's basic needs You should help the patient with basic needs in the home and provide support for getting groceries, prescriptions, and other personal needs.  Monitor the patient's symptoms If they are getting sicker, call his or her medical provider and tell them that the patient has, or is being evaluated for, COVID-19 infection. This will help the healthcare provider's office take steps to keep other people from getting infected. Ask the healthcare provider to call the local or state health department.  Limit the number of people who have contact with the patient  If possible, have only one caregiver for the patient.  Other household members should stay in another home or place of residence. If this is not possible, they should stay  in another room, or be separated from the patient as much as possible. Use a separate bathroom, if available.  Restrict visitors who do not have an essential need to be in the home.  Keep older adults, very young children, and other sick people away from the patient Keep older adults, very young children, and those who have compromised immune systems or chronic health conditions away from the patient. This includes people with chronic heart, lung, or kidney conditions, diabetes, and cancer.  Ensure good ventilation Make sure that  shared spaces in the home have good air flow, such as from an air conditioner or an opened window, weather permitting.  Wash your hands often  Wash your hands often and thoroughly with soap and water for at least 20 seconds. You can use an alcohol based hand sanitizer if soap and water are not available and if your hands are not visibly dirty.  Avoid touching your eyes, nose, and mouth with unwashed hands.  Use disposable paper towels to dry your hands. If not available, use dedicated cloth towels and replace them when they become wet.  Wear a facemask and gloves  Wear a disposable facemask at all times in the room and gloves when you touch or have contact with the patient's blood, body fluids, and/or secretions or excretions, such as sweat, saliva, sputum, nasal mucus, vomit, urine, or feces.  Ensure the mask fits over your nose and mouth tightly, and do not touch it during use.  Throw out disposable facemasks and gloves after using them. Do not reuse.  Wash your hands immediately after removing your facemask and gloves.  If your personal clothing becomes contaminated, carefully remove clothing and launder. Wash your hands after handling contaminated clothing.  Place all used disposable facemasks, gloves, and other waste in a lined container before disposing them with other household waste.  Remove gloves and wash your hands immediately after handling these items.  Do not share dishes, glasses,  or other household items with the patient  Avoid sharing household items. You should not share dishes, drinking glasses, cups, eating utensils, towels, bedding, or other items with a patient who is confirmed to have, or being evaluated for, COVID-19 infection.  After the person uses these items, you should wash them thoroughly with soap and water.  Wash laundry thoroughly  Immediately remove and wash clothes or bedding that have blood, body fluids, and/or secretions or excretions, such as  sweat, saliva, sputum, nasal mucus, vomit, urine, or feces, on them.  Wear gloves when handling laundry from the patient.  Read and follow directions on labels of laundry or clothing items and detergent. In general, wash and dry with the warmest temperatures recommended on the label.  Clean all areas the individual has used often  Clean all touchable surfaces, such as counters, tabletops, doorknobs, bathroom fixtures, toilets, phones, keyboards, tablets, and bedside tables, every day. Also, clean any surfaces that may have blood, body fluids, and/or secretions or excretions on them.  Wear gloves when cleaning surfaces the patient has come in contact with.  Use a diluted bleach solution (e.g., dilute bleach with 1 part bleach and 10 parts water) or a household disinfectant with a label that says EPA-registered for coronaviruses. To make a bleach solution at home, add 1 tablespoon of bleach to 1 quart (4 cups) of water. For a larger supply, add  cup of bleach to 1 gallon (16 cups) of water.  Read labels of cleaning products and follow recommendations provided on product labels. Labels contain instructions for safe and effective use of the cleaning product including precautions you should take when applying the product, such as wearing gloves or eye protection and making sure you have good ventilation during use of the product.  Remove gloves and wash hands immediately after cleaning.  Monitor yourself for signs and symptoms of illness Caregivers and household members are considered close contacts, should monitor their health, and will be asked to limit movement outside of the home to the extent possible. Follow the monitoring steps for close contacts listed on the symptom monitoring form.   ? If you have additional questions, contact your local health department or call the epidemiologist on call at [phone] (available 24/7). ? This guidance is subject to change. For the most up-to-date  guidance from [facility], please refer to their website: YouBlogs.pl    ED Prescriptions    Medication Sig Dispense Auth. Provider   fluticasone (FLONASE) 50 MCG/ACT nasal spray Place 1-2 sprays into both nostrils daily for 7 days. 1 g Jolana Runkles, Fletcher C, PA-C     Controlled Substance Prescriptions [place] Controlled Substance Registry consulted? Not Applicable   Janith Lima, [place] 04/22/19 [id number]

## 2019-04-24 ENCOUNTER — Encounter (HOSPITAL_COMMUNITY): Payer: Self-pay

## 2019-07-23 ENCOUNTER — Other Ambulatory Visit: Payer: Self-pay | Admitting: Obstetrics & Gynecology

## 2019-07-23 DIAGNOSIS — R928 Other abnormal and inconclusive findings on diagnostic imaging of breast: Secondary | ICD-10-CM

## 2019-07-23 DIAGNOSIS — N6489 Other specified disorders of breast: Secondary | ICD-10-CM

## 2019-07-24 ENCOUNTER — Other Ambulatory Visit: Payer: Self-pay | Admitting: Obstetrics & Gynecology

## 2019-07-24 DIAGNOSIS — R928 Other abnormal and inconclusive findings on diagnostic imaging of breast: Secondary | ICD-10-CM

## 2019-07-24 DIAGNOSIS — N6489 Other specified disorders of breast: Secondary | ICD-10-CM

## 2019-07-27 ENCOUNTER — Other Ambulatory Visit: Payer: Self-pay

## 2019-07-27 ENCOUNTER — Other Ambulatory Visit: Payer: PRIVATE HEALTH INSURANCE

## 2019-07-27 ENCOUNTER — Ambulatory Visit
Admission: RE | Admit: 2019-07-27 | Discharge: 2019-07-27 | Disposition: A | Discharge status: Home / Self Care | Payer: No Typology Code available for payment source | Source: Ambulatory Visit | Attending: Obstetrics & Gynecology | Admitting: Obstetrics & Gynecology

## 2019-07-27 ENCOUNTER — Ambulatory Visit
Admission: RE | Admit: 2019-07-27 | Discharge: 2019-07-27 | Disposition: A | Discharge status: Home / Self Care | Payer: PRIVATE HEALTH INSURANCE | Source: Ambulatory Visit | Attending: Obstetrics & Gynecology | Admitting: Obstetrics & Gynecology

## 2019-07-27 DIAGNOSIS — N6489 Other specified disorders of breast: Secondary | ICD-10-CM

## 2019-07-27 DIAGNOSIS — R928 Other abnormal and inconclusive findings on diagnostic imaging of breast: Secondary | ICD-10-CM

## 2019-07-27 HISTORY — PX: BREAST BIOPSY: SHX20

## 2019-07-27 IMAGING — US US  BREAST BX W/ LOC DEV 1ST LESION IMG BX SPEC US GUIDE*R*
1 series · 9 of 9 positions shown · non-contrast
Comparison: Previous exam(s).
COMPARISON: Previous exam(s).

Addendum:
CLINICAL DATA: 43-year-old female with an indeterminate mass in the
right breast the 8 o'clock position.

EXAM:
ULTRASOUND GUIDED RIGHT BREAST CORE NEEDLE BIOPSY

[Series 1: us breast bx w/ loc dev 1st lesion img bx spec us  · 0.06mm/px · 9 of 9 slices shown]
[im 1/9]
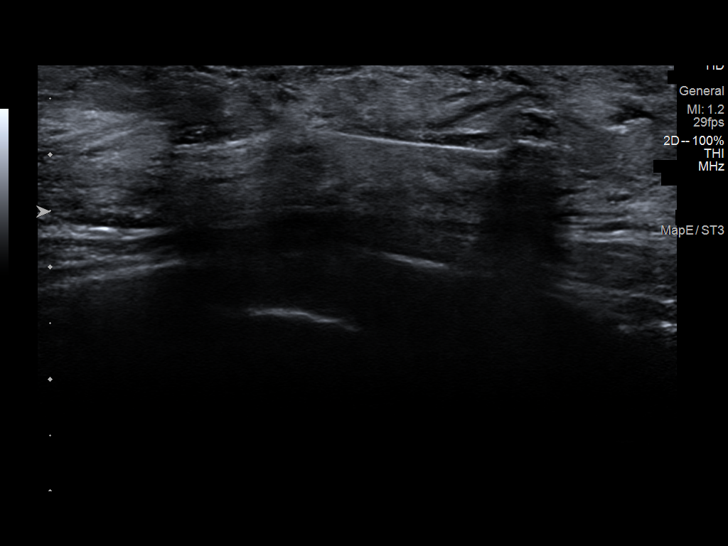
[im 2/9]
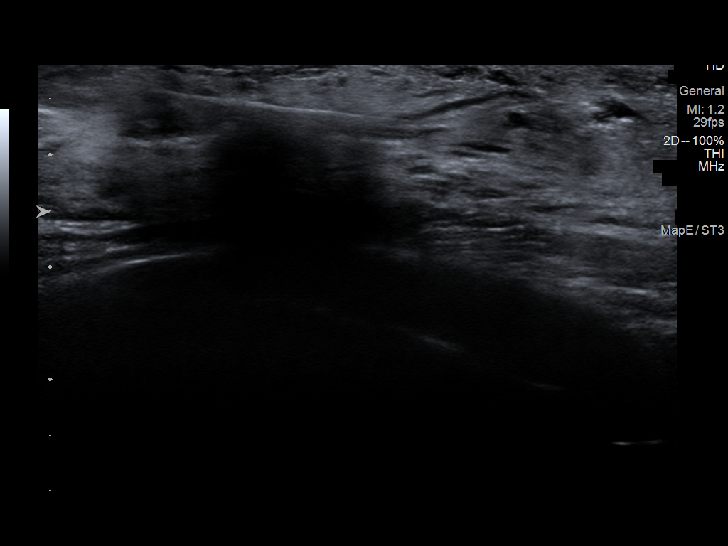
[im 3/9]
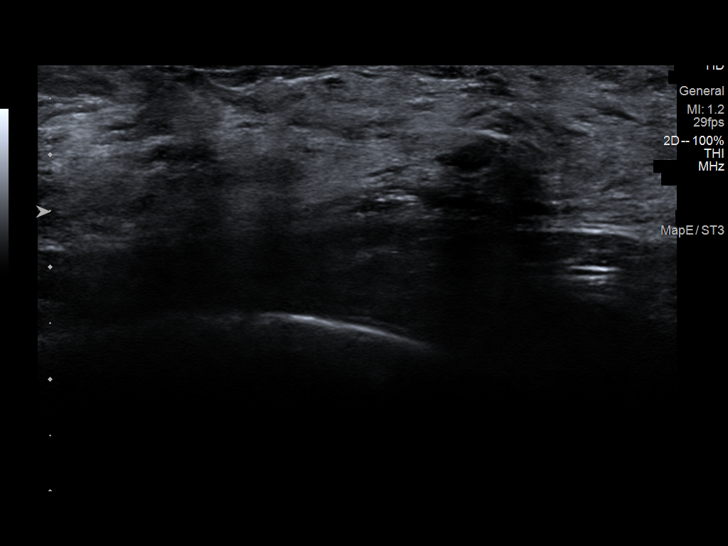
[im 4/9]
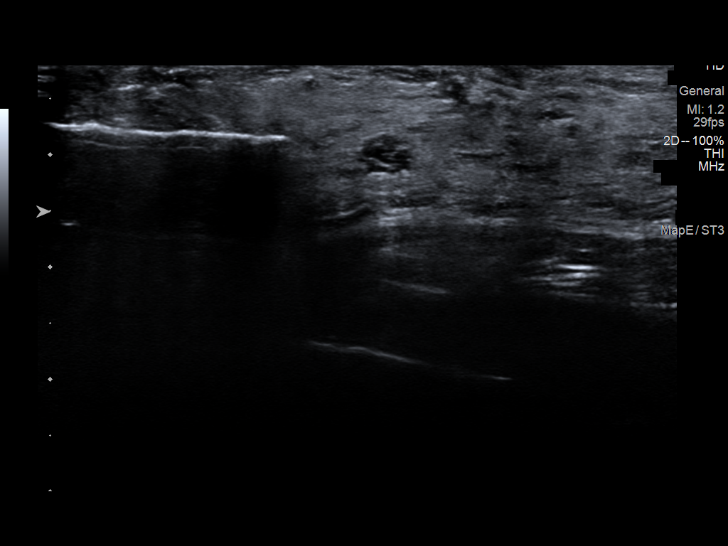
[im 5/9]
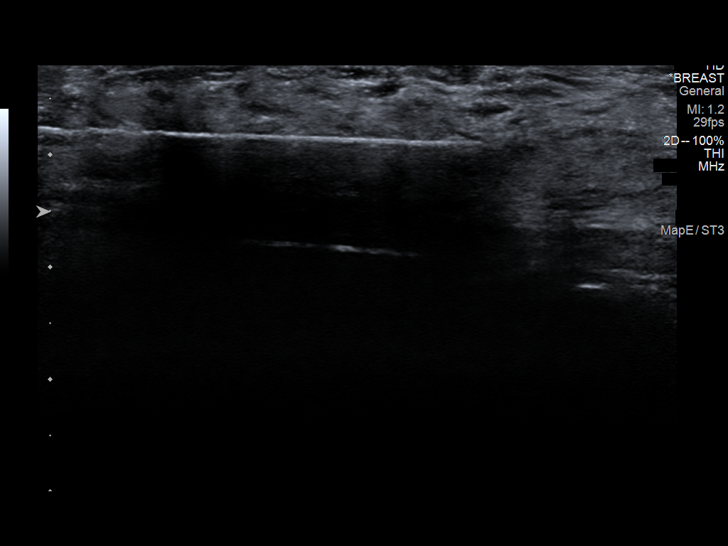
[im 6/9]
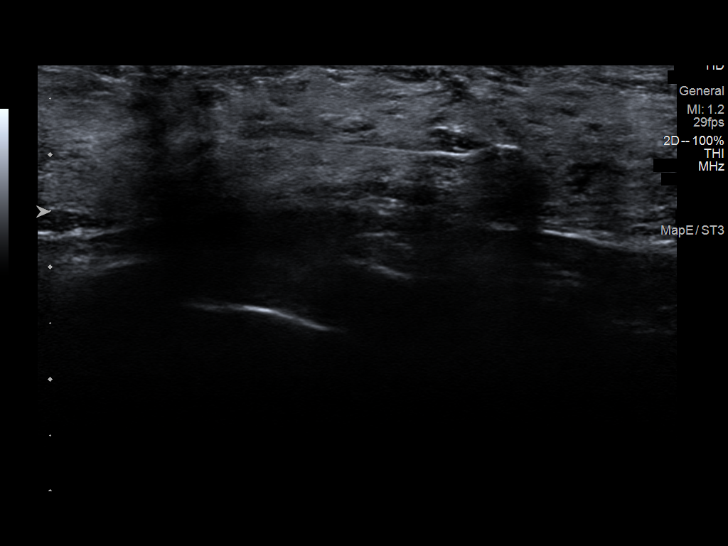
[im 7/9]
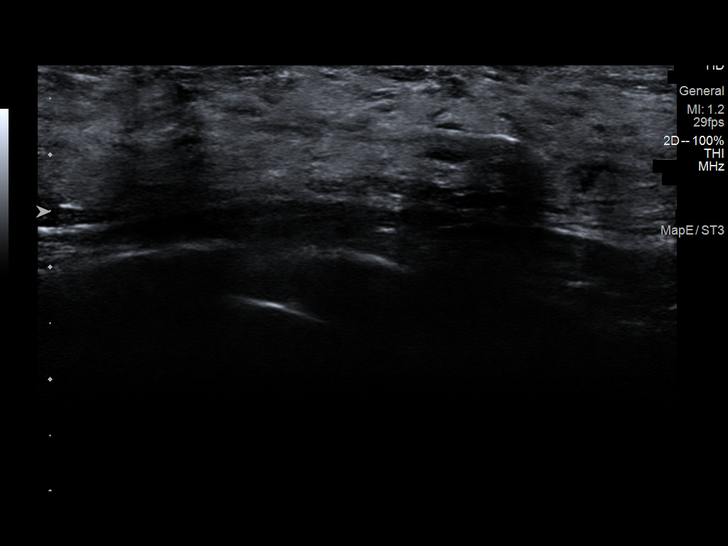
[im 8/9]
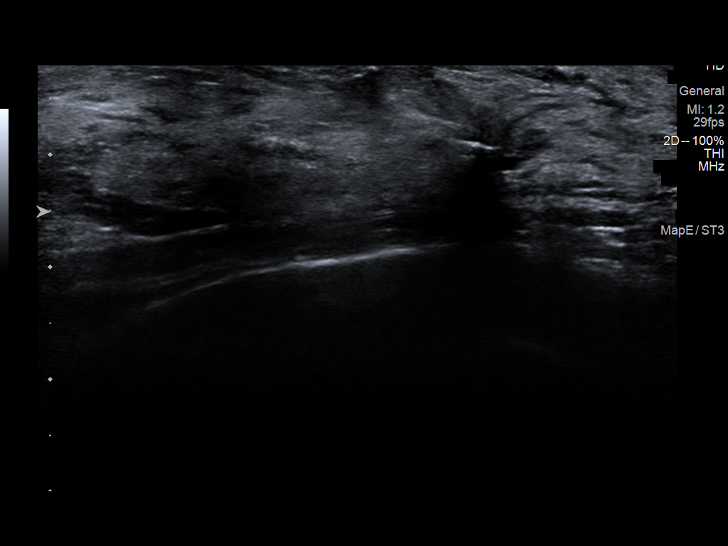
[im 9/9]
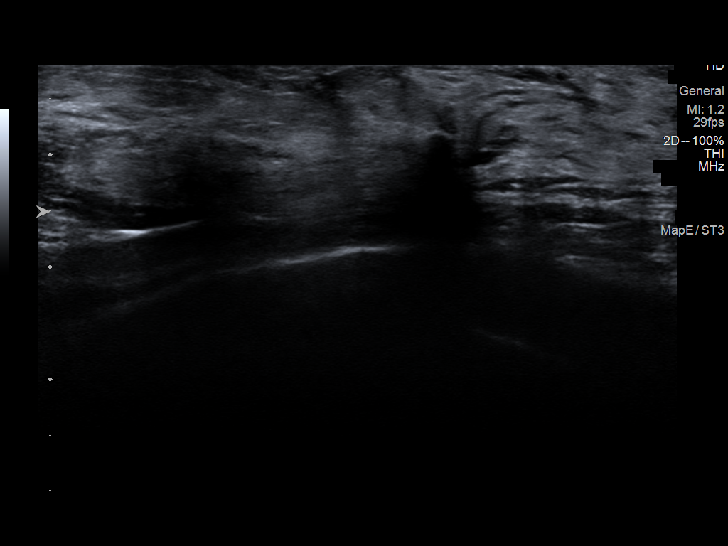

[9 of 9 positions shown; findings below may reference images not displayed]



Lesion quadrant: Lower outer

Using sterile technique and 1% Lidocaine as local anesthetic, under
direct ultrasound visualization, a 14 gauge BEGOVIC OTCEPLJENJE device was
used to perform biopsy of the mass in the right breast at the 8
o'clock position using a lateral to medial approach. At the
conclusion of the procedure ribbon shaped tissue marker clip was
deployed into the biopsy cavity. Follow up 2 view mammogram was
performed and dictated separately.
IMPRESSION: Ultrasound guided biopsy of the mass in the right breast at the 8
o'clock position. No apparent complications.

ADDENDUM:
Pathology revealed BENIGN BREAST TISSUE WITH DENSE FIBROSIS of the
RIGHT breast, 8 o'clock. The biopsy consists of normal breast
elements including dense fibrosis. These findings may represent a
benign hamartoma given the radiographic findings of a mass. This was
found to be concordant by Dr. BEGOVIC OTCEPLJENJE.

Pathology results were discussed with the patient by telephone. The
patient reported doing well after the biopsy with tenderness at the
site. Post biopsy instructions and care were reviewed and questions
were answered. The patient was encouraged to call The [REDACTED]

The patient was instructed to return for annual screening
mammography. A screening breast MRI is recommended given patient's
extremely dense breast tissue and strong family history of breast
cancer.

Pathology results reported by BEGOVIC OTCEPLJENJE, RN on [DATE].



Lesion quadrant: Lower outer

Using sterile technique and 1% Lidocaine as local anesthetic, under
direct ultrasound visualization, a 14 gauge BEGOVIC OTCEPLJENJE device was
used to perform biopsy of the mass in the right breast at the 8
o'clock position using a lateral to medial approach. At the
conclusion of the procedure ribbon shaped tissue marker clip was
deployed into the biopsy cavity. Follow up 2 view mammogram was
performed and dictated separately.
IMPRESSION: Ultrasound guided biopsy of the mass in the right breast at the 8
o'clock position. No apparent complications.

## 2019-07-27 IMAGING — US US BREAST*R* LIMITED INC AXILLA
1 series · 5 of 5 positions shown · non-contrast
Comparison: Previous exam(s).

CLINICAL DATA: 43-year-old female presents from outside facility
for recommendation of ultrasound-guided biopsy of a mass in the
right breast the 8 o'clock position.

The patient has a very strong family history of breast cancer,
including in her maternal grandmother and mother. The patient's
mother has a history of bilateral breast cancer and is BRCA 2
positive. The patient states she is BRCA negative.
EXAM:
ULTRASOUND OF THE RIGHT BREAST

[Series 1: us breast*right* limited inc axilla · 0.04mm/px · 5 of 5 slices shown]
[im 1/5]
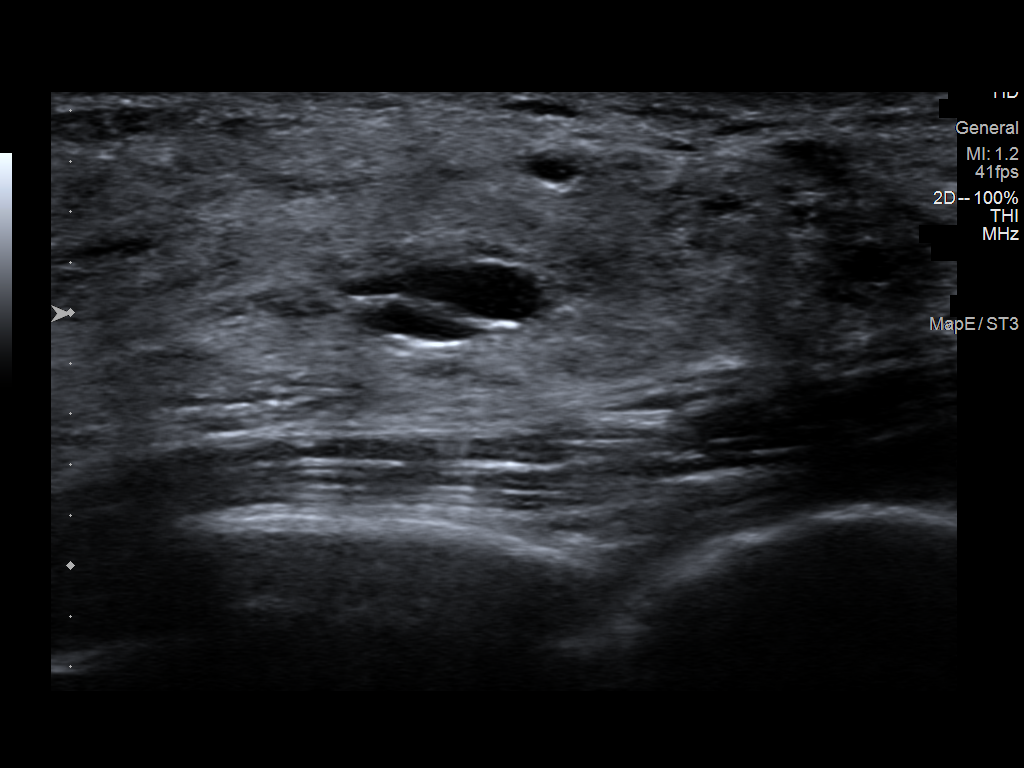
[im 2/5]
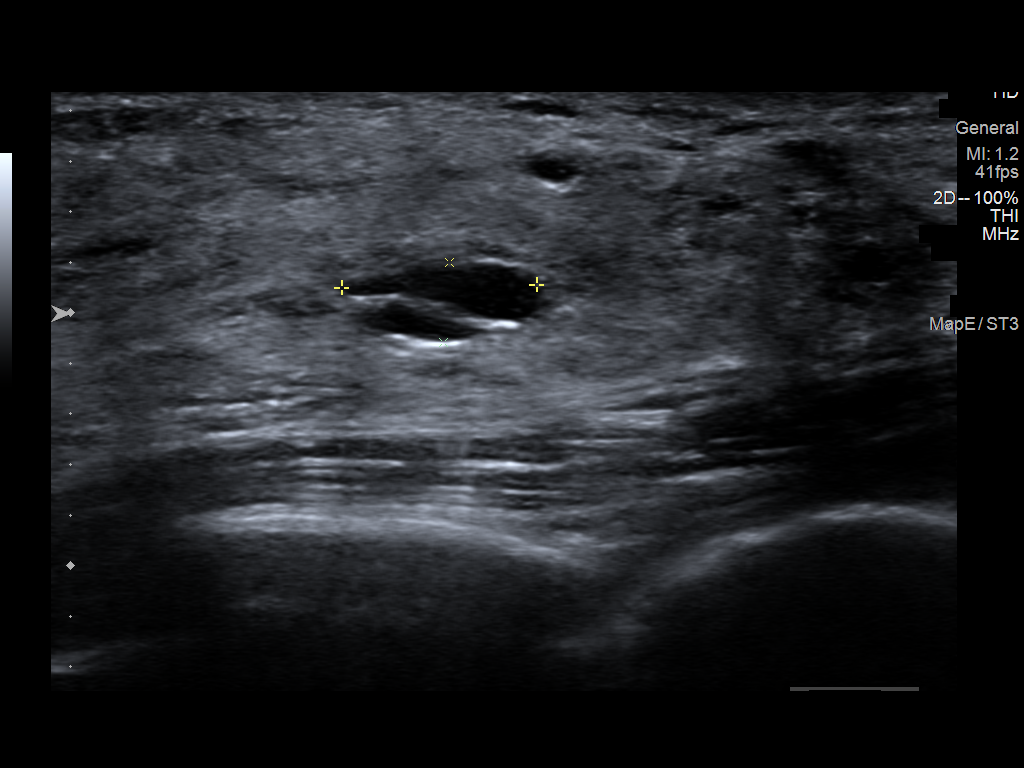
[im 3/5]
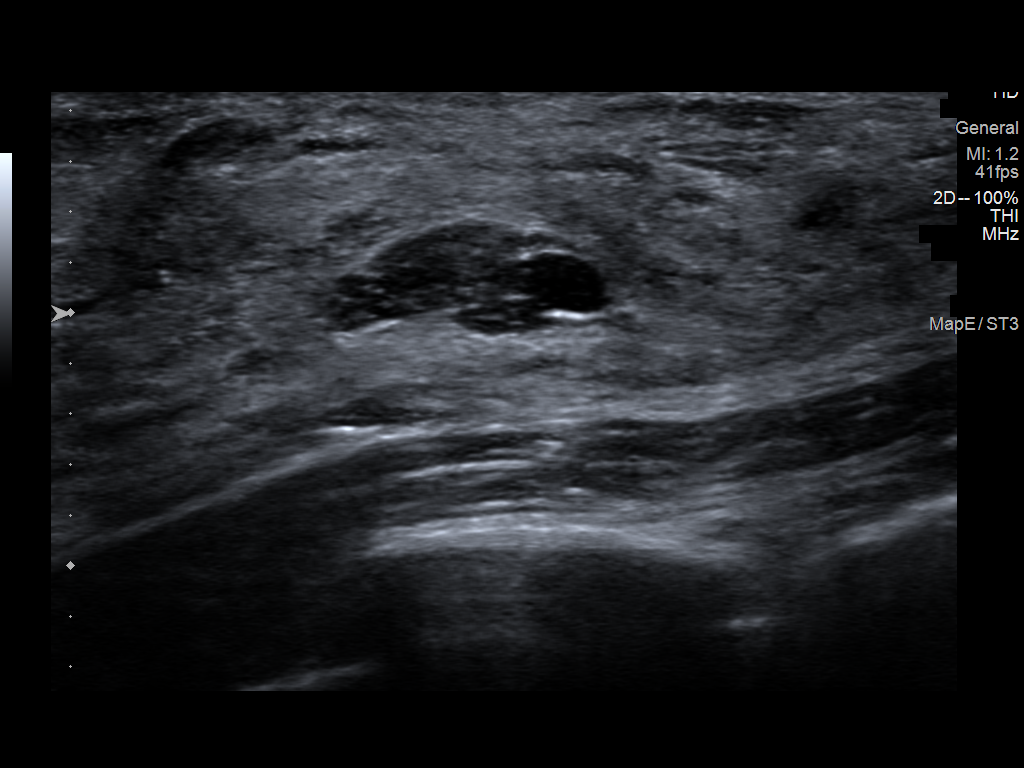
[im 4/5]
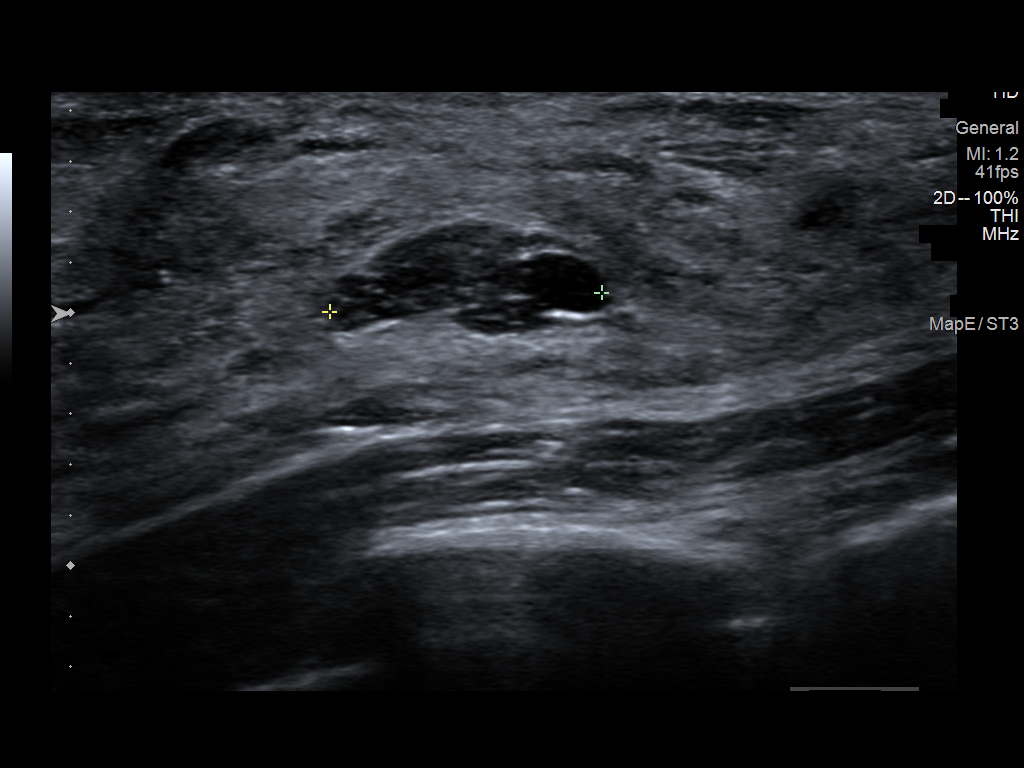
[im 5/5]
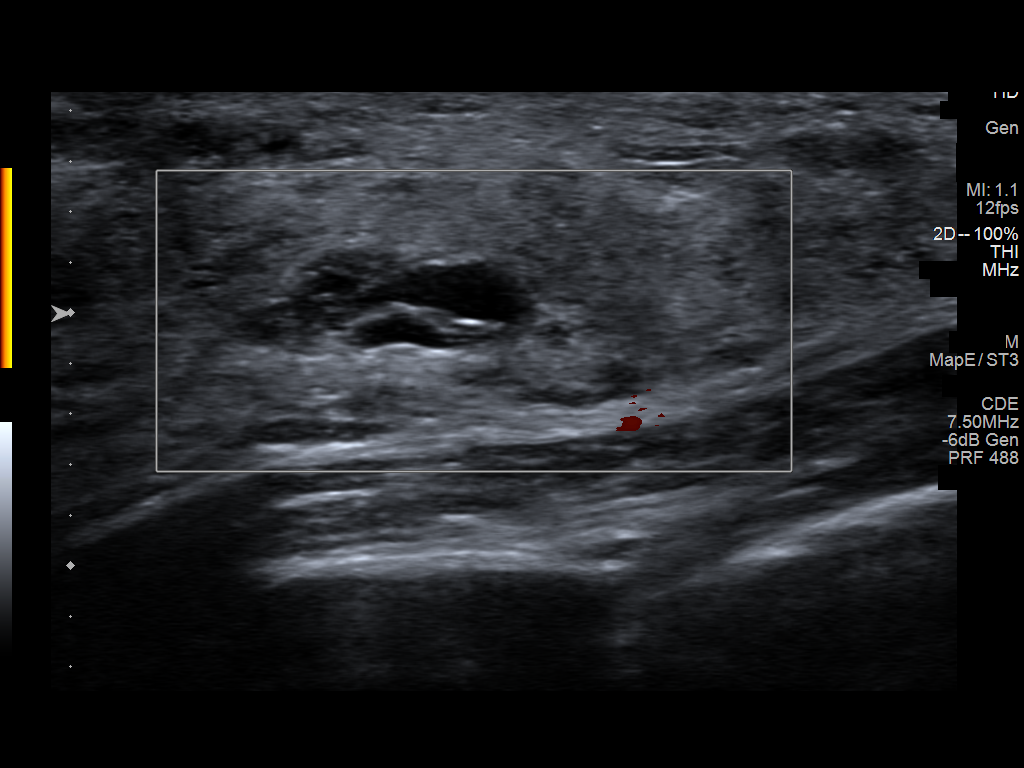

[5 of 5 positions shown; findings below may reference images not displayed]

FINDINGS: Targeted ultrasound of the outer right breast was performed. Several
small cysts are identified in the outer right breast. There is a
mixed cystic and solid appearing mass in the right breast at 8
o'clock 1 cm from the nipple measuring 1.1 x 0.3 x 0.8 cm. This
demonstrates imaging features suggestive of an area of apocrine
metaplasia.
IMPRESSION: Indeterminate mass in the right breast at the 8 o'clock position.

RECOMMENDATION:
Ultrasound-guided biopsy of the mass in the right breast at the 8
o'clock position is recommended.

I have discussed the findings and recommendations with the patient.
If applicable, a reminder letter will be sent to the patient
regarding the next appointment.

BI-RADS CATEGORY  4: Suspicious.

## 2019-07-27 IMAGING — MG MM BREAST LOCALIZATION CLIP
4 series · 4 of 12 positions shown · non-contrast
Comparison: Previous exam(s).

CLINICAL DATA: Post ultrasound-guided biopsy of a mass in the right
breast at the 8 o'clock position.

EXAM:
DIAGNOSTIC RIGHT MAMMOGRAM POST ULTRASOUND BIOPSY

[R ML synth-2D]
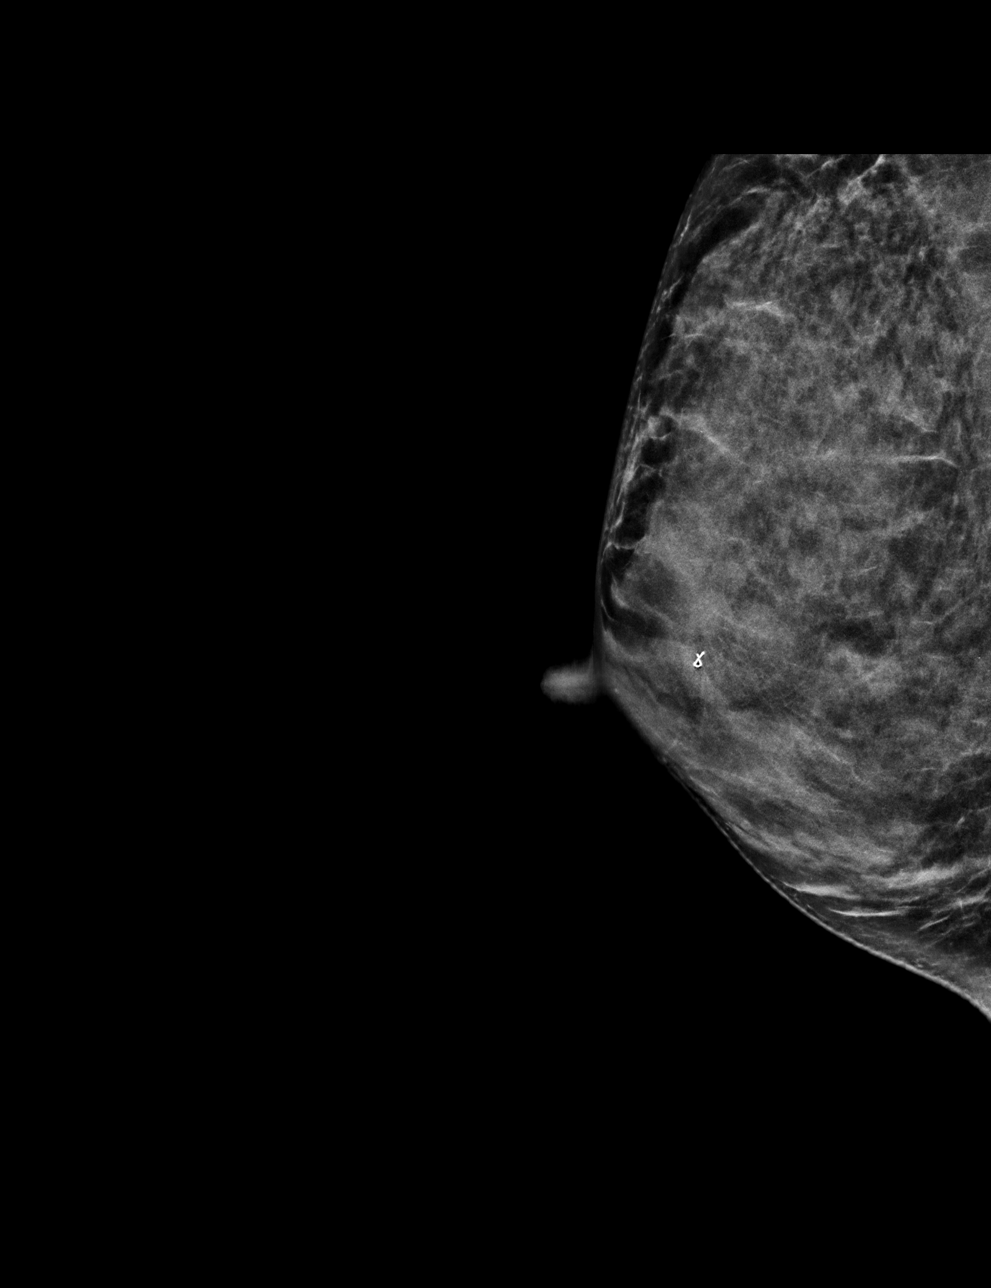

[R CC synth-2D]
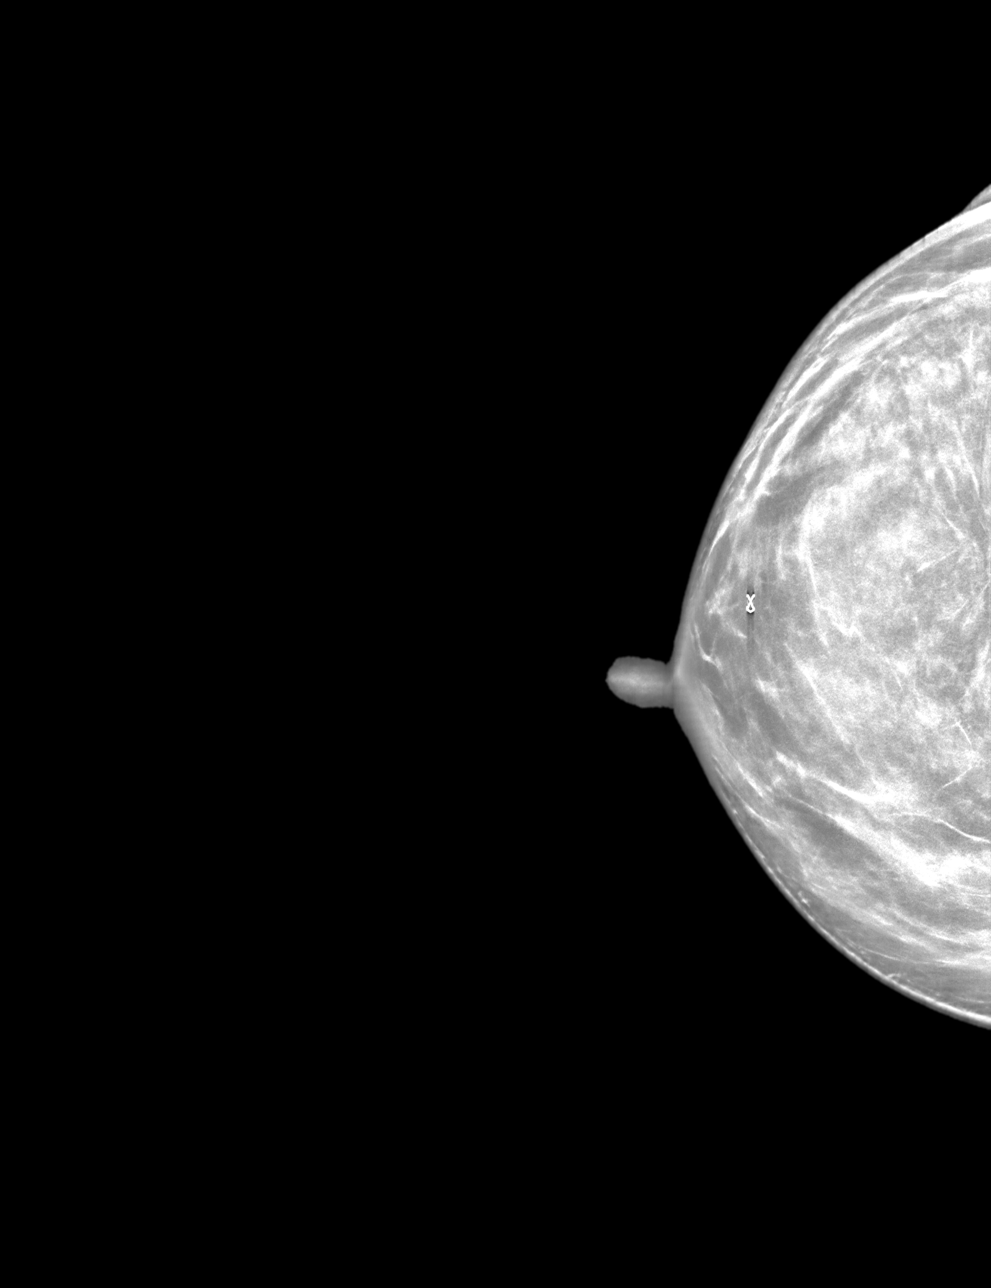

[R ML tomo · tomo slice 29/56.0]
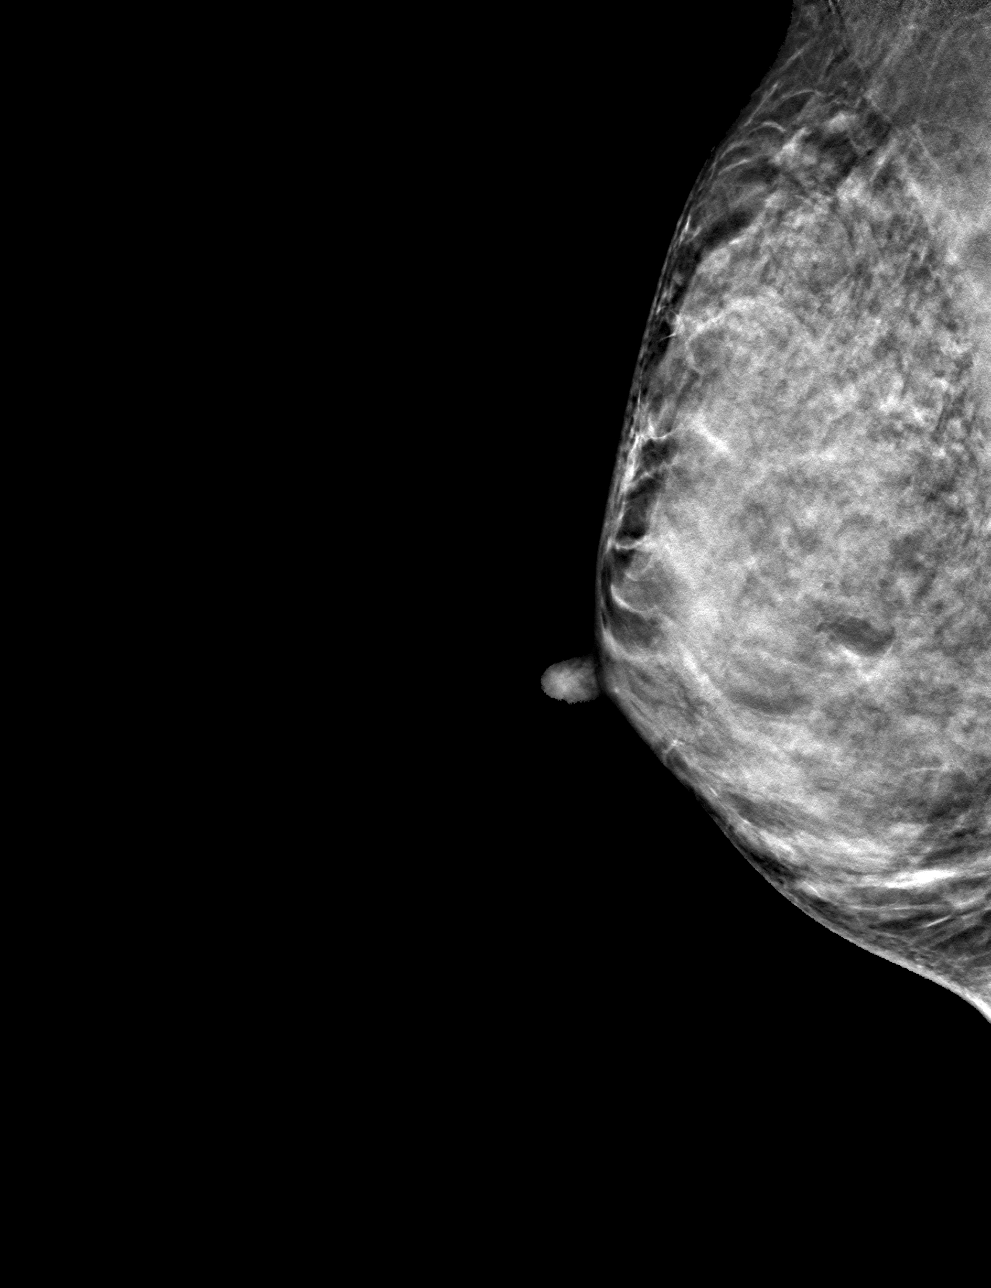

[R CC tomo · tomo slice 39/78.0]
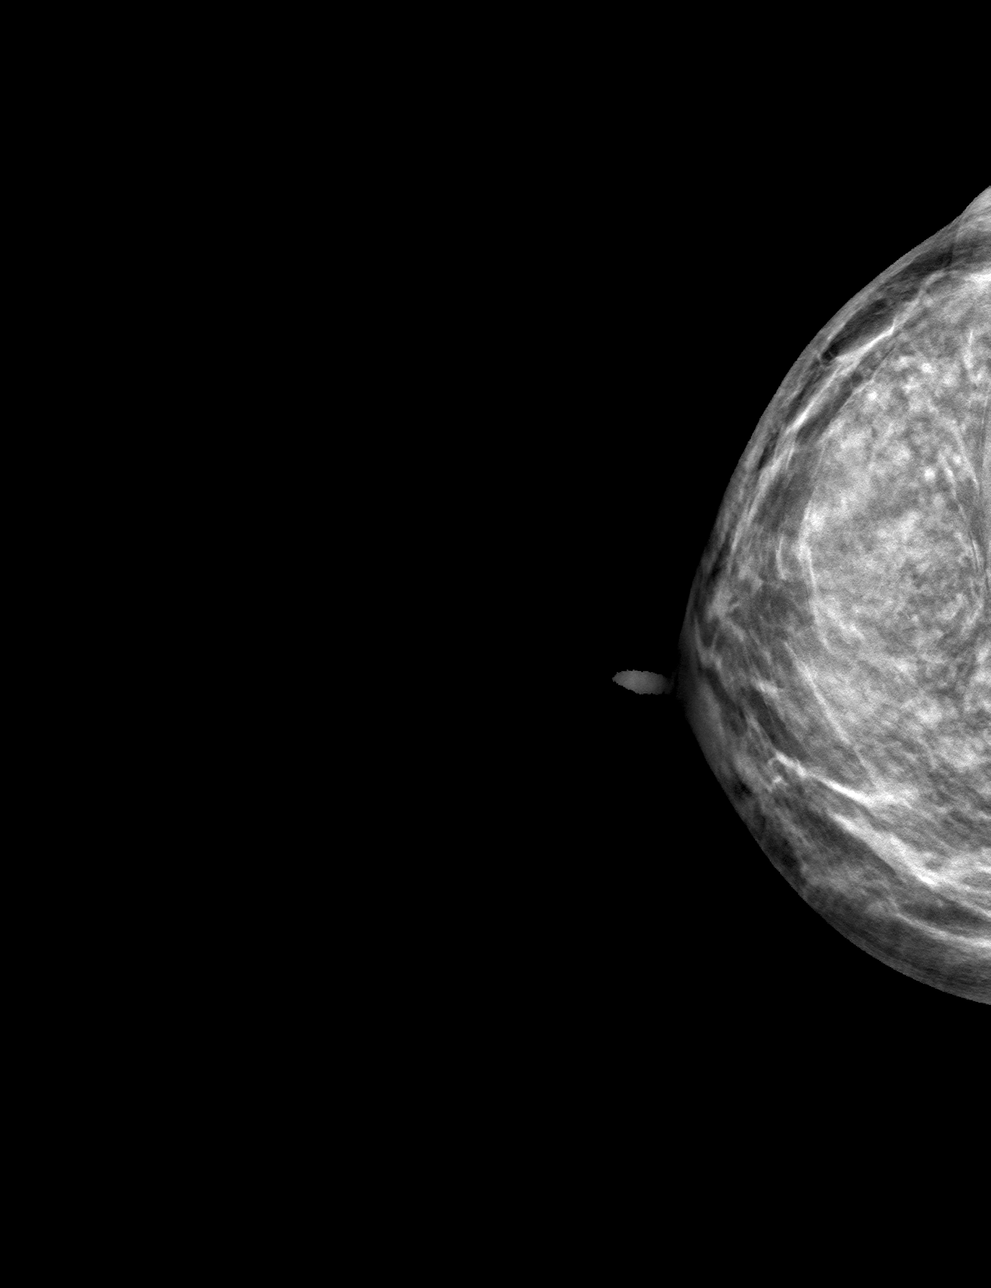

[4 of 12 positions shown; findings below may reference images not displayed]

FINDINGS: Mammographic images were obtained following ultrasound guided biopsy
of a mass in the right breast at the 8 o'clock position. The biopsy
marking clip is in expected position at the site of biopsy. A ribbon
shaped biopsy marking clip is present at the site of the biopsied
mass in the right breast at the 8 o'clock position.
IMPRESSION: Ribbon shaped biopsy marking clip at site of biopsied mass in the
right breast at the 8 o'clock position.

Final Assessment: Post Procedure Mammograms for Marker Placement

## 2020-12-16 ENCOUNTER — Other Ambulatory Visit: Payer: Self-pay

## 2020-12-16 ENCOUNTER — Ambulatory Visit (INDEPENDENT_AMBULATORY_CARE_PROVIDER_SITE_OTHER): Payer: No Typology Code available for payment source | Admitting: Otolaryngology

## 2020-12-16 ENCOUNTER — Encounter (INDEPENDENT_AMBULATORY_CARE_PROVIDER_SITE_OTHER): Payer: Self-pay | Admitting: Otolaryngology

## 2020-12-16 VITALS — Temp 97.9°F

## 2020-12-16 DIAGNOSIS — H9041 Sensorineural hearing loss, unilateral, right ear, with unrestricted hearing on the contralateral side: Secondary | ICD-10-CM

## 2020-12-16 DIAGNOSIS — H8112 Benign paroxysmal vertigo, left ear: Secondary | ICD-10-CM

## 2020-12-16 NOTE — Progress Notes (Signed)
HPI: Ebony Lamb is a 45 y.o. female who presents is referred by Antony Contras, MD at [facility] for evaluation of episodes of vertigo as well as a recent episode of ringing in her right ear that she noticed in January.  The only ring for a couple of days and is now resolved and she is not having ringing in the right ear presently.  Concerning her episodes of vertigo is seem to occur mostly when she was in bed at night and rolled on the left side.  The vertigo would only last for approximately 20 to 30 seconds and then would stop although she has some nausea associated with it.  She has previously been treated with meclizine for vertigo.  She has not had an attack of vertigo since June of last year.  She presents here for evaluation.  She is having no dizziness or vertigo today and no ringing in the right ear..  Past Medical History:  Diagnosis Date  . Abnormal Pap smear 2002  . Depression    history of depression- denies currently  . H/O varicella   . Hypertension 2013   post partum- meds x 1 mo  . No pertinent past medical history   . Preterm labor    Past Surgical History:  Procedure Laterality Date  . CESAREAN SECTION  11/14/2011   Procedure: CESAREAN SECTION;  Surgeon: Eli Hose, MD;  Location: [facility];  Service: Gynecology;  Laterality: N/A;  Primary cesarean section with delivery of baby boy at [phone]. Apgars 9/9.  Marland Kitchen CESAREAN SECTION N/A 03/22/2014   Procedure: Repeat CESAREAN SECTION;  Surgeon: Alwyn Pea, MD;  Location: [facility];  Service: Obstetrics;  Laterality: N/A;  . FOOT SURGERY    . WISDOM TOOTH EXTRACTION     Social History   Socioeconomic History  . Marital status: Married    Spouse name: Not on file  . Number of children: Not on file  . Years of education: Not on file  . Highest education level: Not on file  Occupational History  . Not on file  Tobacco Use  . Smoking status: Never Smoker  . Smokeless tobacco: Never Used  Vaping Use  . Vaping Use: Never  used  Substance and Sexual Activity  . Alcohol use: Yes    Comment: occasionally  . Drug use: No  . Sexual activity: Yes    Birth control/protection: Condom, Coitus interruptus  Other Topics Concern  . Not on file  Social History Narrative  . Not on file   Social Determinants of Health   Financial Resource Strain: Not on file  Food Insecurity: Not on file  Transportation Needs: Not on file  Physical Activity: Not on file  Stress: Not on file  Social Connections: Not on file   Family History  Problem Relation Age of Onset  . Cancer Father   . Hypertension Father   . Kidney disease Father   . Depression Father   . Hyperlipidemia Father   . Cancer Maternal Grandmother   . Heart disease Maternal Grandfather   . Cancer Paternal Grandmother   . Stroke Paternal Grandfather   . Hypertension Brother   . Hyperlipidemia Brother   . Cancer Mother   . Osteopenia Mother   . Anesthesia problems Neg Hx   . Diabetes Neg Hx    No Known Allergies Prior to Admission medications   Medication Sig Start Date End Date Taking? Authorizing Provider  fluticasone (FLONASE) 50 MCG/ACT nasal spray Place  1-2 sprays into both nostrils daily for 7 days. 04/21/19 04/28/19  Wieters, Hallie C, PA-C  ibuprofen (ADVIL,MOTRIN) 600 MG tablet Take 1 tablet (600 mg total) by mouth every 6 (six) hours as needed for fever, headache, mild pain, moderate pain or cramping. 03/25/14   Farrel Gordon, CNM  oxyCODONE-acetaminophen (PERCOCET/ROXICET) 5-325 MG per tablet Take 1-2 tablets by mouth every 4 (four) hours as needed for moderate pain or severe pain (moderate - severe pain). 03/25/14   Farrel Gordon, CNM  Prenatal Vit-Fe Fumarate-FA (PRENATAL MULTIVITAMIN) TABS Take 1 tablet by mouth daily. 11/17/11   Burkett, Claretta Fraise, NP  ferrous sulfate (FERROUSUL) 325 (65 FE) MG tablet Take 1 tablet (325 mg total) by mouth 2 (two) times daily. 11/17/11 11/26/11  Gypsy Lore, NP     Positive ROS: Otherwise normal  All  other systems have been reviewed and were otherwise negative with the exception of those mentioned in the HPI and as above.  Physical Exam: Constitutional: Alert, well-appearing, no acute distress Ears: External ears without lesions or tenderness. Ear canals are clear bilaterally.  Both TMs are clear with good mobility on pneumatic otoscopy and no middle ear space abnormality noted. Nasal: External nose without lesions. Septum relatively midline with mild rhinitis. Clear nasal passages otherwise with no signs of infection. Oral: Lips and gums without lesions. Tongue and palate mucosa without lesions. Posterior oropharynx clear. Neck: No palpable adenopathy or masses Respiratory: Breathing comfortably  Skin: No facial/neck lesions or rash noted.  Audiologic testing was performed in the office and audiologic testing patient had an asymmetric right ear sensorineural hearing loss.  See essentially normal hearing in the left ear and approximate 15-20 dB SNHL in the left ear mid frequencies.  SRT's were 0 dB on the left and 15 dB on the right.  She had type A tympanograms bilaterally.  Procedures  Assessment: Asymmetric right ear sensorineural hearing loss History of vertigo which was consistent with benign paroxysmal positional vertigo.  She is having no vertigo problems today.  Plan: Patient is knowledgeable of the BPPV as well as the Epley maneuver and I gave her information on BPPV and the Epley maneuver and reviewed this with her in the office today. Because of the asymmetric right ear sensorineural hearing loss as well as history of tinnitus and vertigo I feel we should schedule an MRI scan to rule out any cochlear or retrocochlear pathology. I reviewed this with her and we will plan on scheduling this at her convenience in the next few weeks. She will call us back concerning results of the MRI scan after she has this performed.   Radene Journey, MD   CC:

## 2020-12-17 ENCOUNTER — Encounter (INDEPENDENT_AMBULATORY_CARE_PROVIDER_SITE_OTHER): Payer: Self-pay

## 2020-12-19 ENCOUNTER — Other Ambulatory Visit (INDEPENDENT_AMBULATORY_CARE_PROVIDER_SITE_OTHER): Payer: Self-pay

## 2020-12-19 DIAGNOSIS — H9041 Sensorineural hearing loss, unilateral, right ear, with unrestricted hearing on the contralateral side: Secondary | ICD-10-CM

## 2021-01-06 ENCOUNTER — Other Ambulatory Visit: Payer: Self-pay

## 2021-01-06 ENCOUNTER — Ambulatory Visit
Admission: RE | Admit: 2021-01-06 | Discharge: 2021-01-06 | Disposition: A | Discharge status: Home / Self Care | Payer: No Typology Code available for payment source | Source: Ambulatory Visit | Attending: Otolaryngology | Admitting: Otolaryngology

## 2021-01-06 DIAGNOSIS — H9041 Sensorineural hearing loss, unilateral, right ear, with unrestricted hearing on the contralateral side: Secondary | ICD-10-CM

## 2021-01-06 IMAGING — MR MR HEAD WO/W CM
12 series · 38 of 48 positions shown · IV contrast (multihance)
Comparison: None.

CLINICAL DATA: Sudden hearing loss on the right.  Tinnitus.

EXAM:
MRI HEAD WITHOUT AND WITH CONTRAST
TECHNIQUE: Multiplanar, multiecho pulse sequences of the brain and surrounding
structures were obtained without and with intravenous contrast.
CONTRAST:  15mL MULTIHANCE GADOBENATE DIMEGLUMINE 529 MG/ML IV SOLN

[Series 2: T1 · sagittal · 5.0mm · 0.45mm/px · 3 of 22 slices shown (1 of 3)]
[im 1/22]
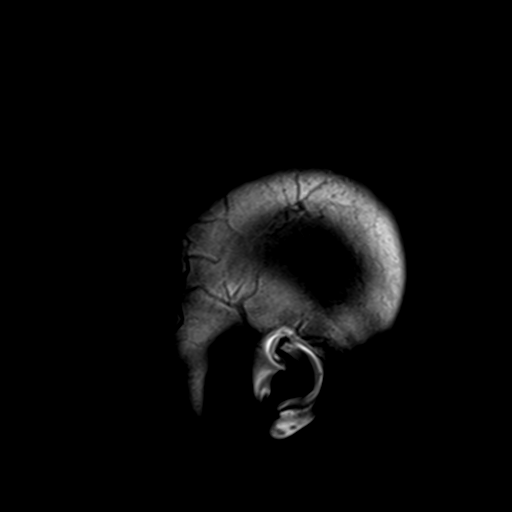
[im 11/22]
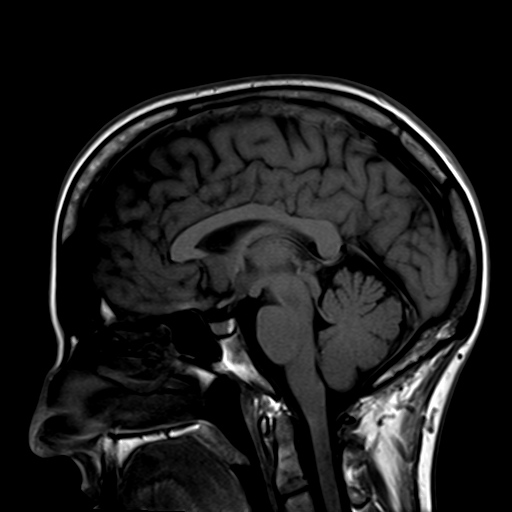
[im 22/22]
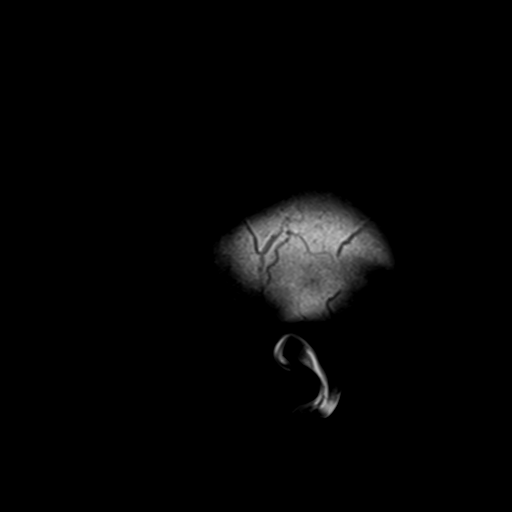

[Series 3: DWI · axial · 3.0mm · 1.80mm/px · z∈[-19,+134]mm · 9 of 104 slices shown (1 of 2)]
[im 1/104]
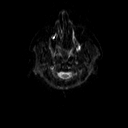
[im 19/104]
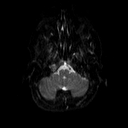
[im 29/104]
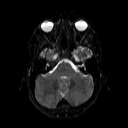
[im 47/104]
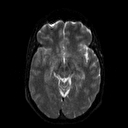
[im 57/104]
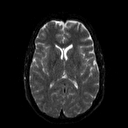
[im 75/104]
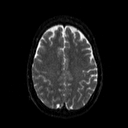
[im 85/104]
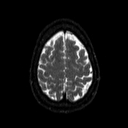
[im 94/104]
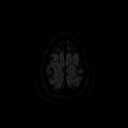
[im 104/104]
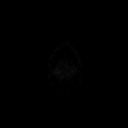

[Series 4: DWI · axial · 3.0mm · 1.80mm/px · z∈[-19,+134]mm · 6 of 52 slices shown (2 of 2)]
[im 1/52]
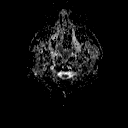
[im 11/52]
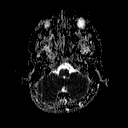
[im 21/52]
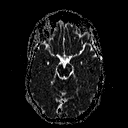
[im 31/52]
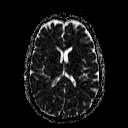
[im 41/52]
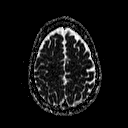
[im 52/52]
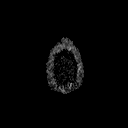

[Series 5: T2 · axial · 5.0mm · 0.60mm/px · z∈[-28,+135]mm · 3 of 26 slices shown]
[im 1/26]
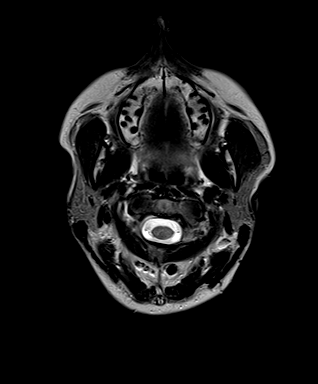
[im 13/26]
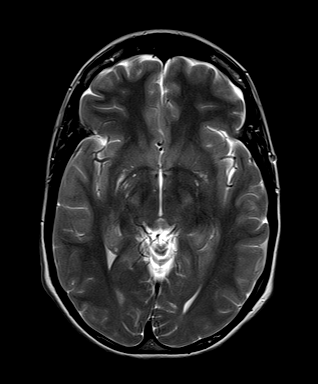
[im 26/26]
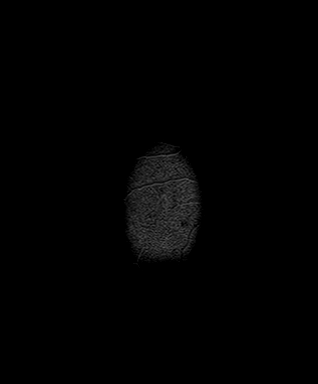

[Series 6: FLAIR · axial · 3.0mm · 0.45mm/px · z∈[-24,+132]mm · 3 of 27 slices shown]
[im 1/27]
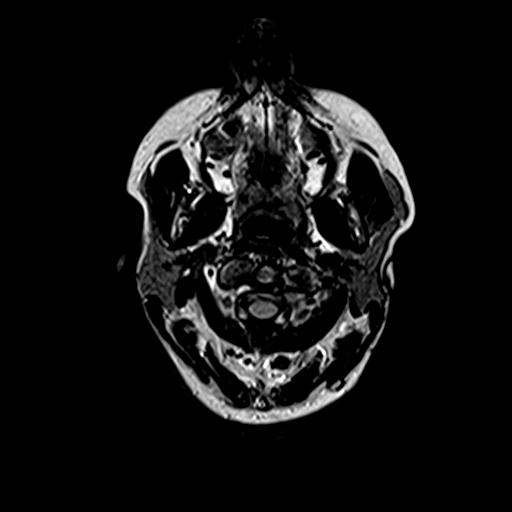
[im 14/27]
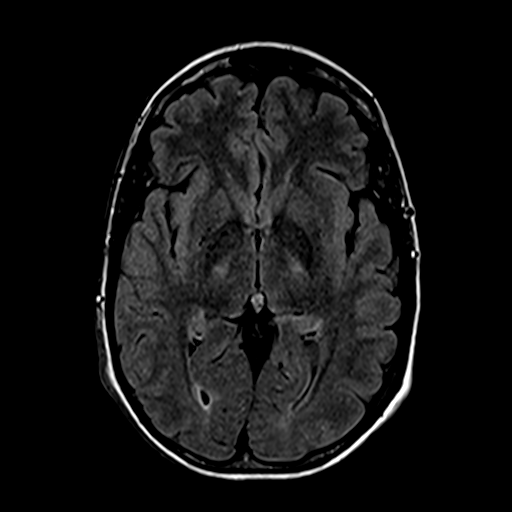
[im 27/27]
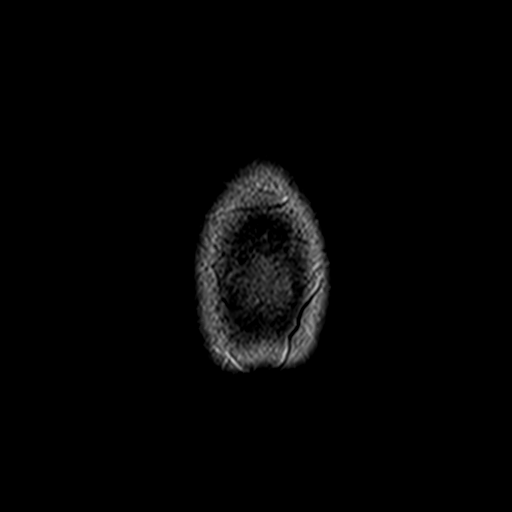

[Series 8: swi_images · axial · 3.0mm · 0.90mm/px · z∈[-17,+124]mm · 5 of 48 slices shown]
[im 1/48]
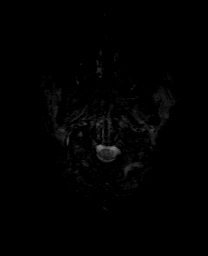
[im 12/48]
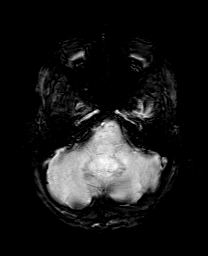
[im 24/48]
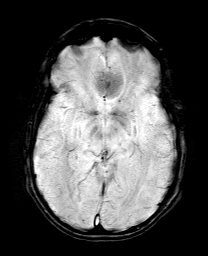
[im 36/48]
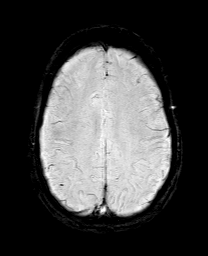
[im 48/48]
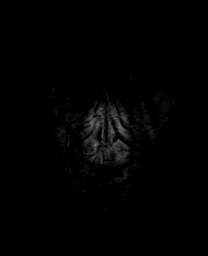

[Series 9: T1 · coronal · 3.0mm · 0.35mm/px · 1 of 11 slices shown (2 of 3)]
[im 1/11]
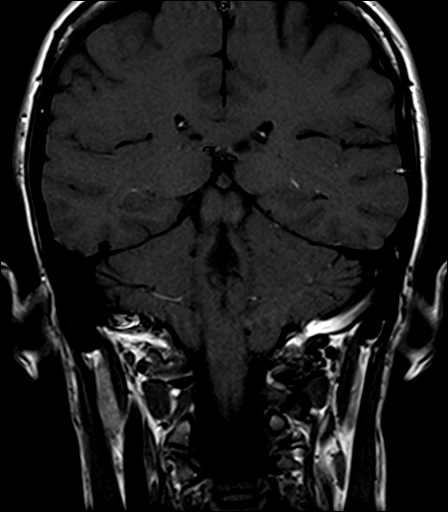

[Series 10: T1 · axial · 3.0mm · 0.35mm/px · 1 of 11 slices shown (3 of 3)]
[im 1/11]
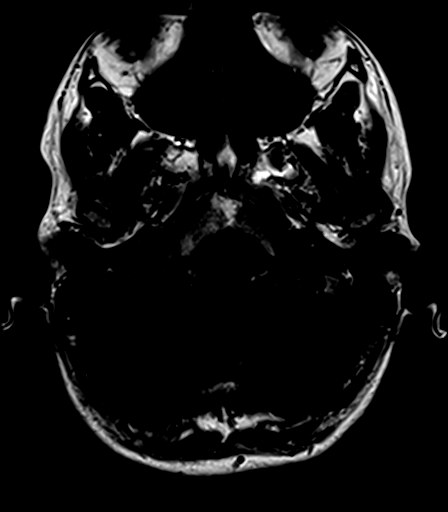

[Series 11: bSSFP · axial · 1.0mm · 0.28mm/px · z∈[+10,+45]mm · 4 of 36 slices shown]
[im 1/36]
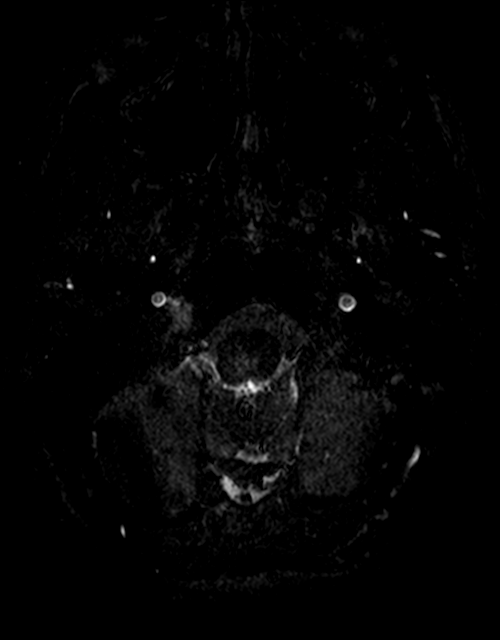
[im 12/36]
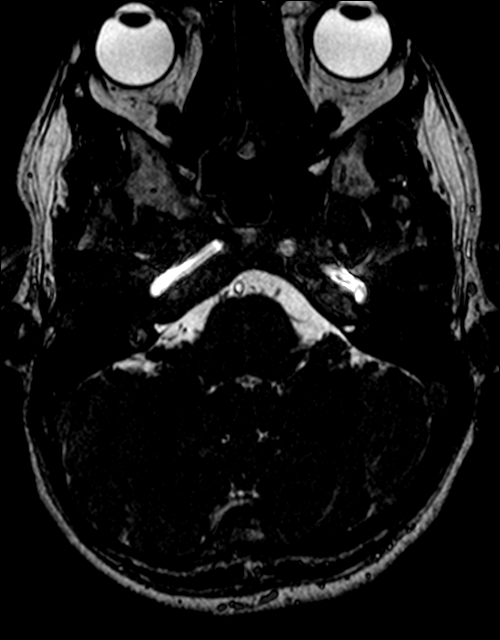
[im 24/36]
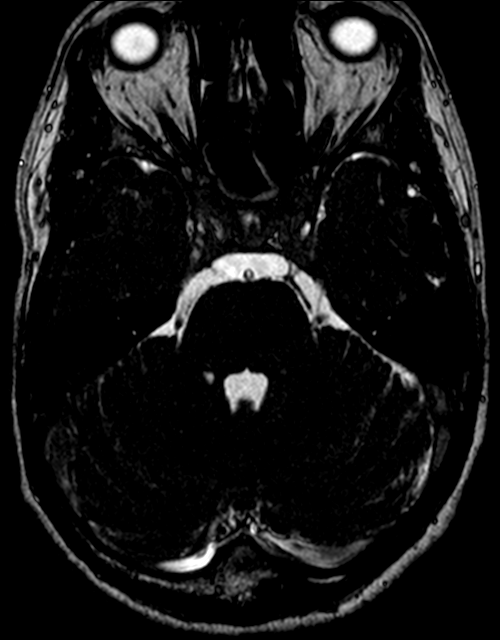
[im 36/36]
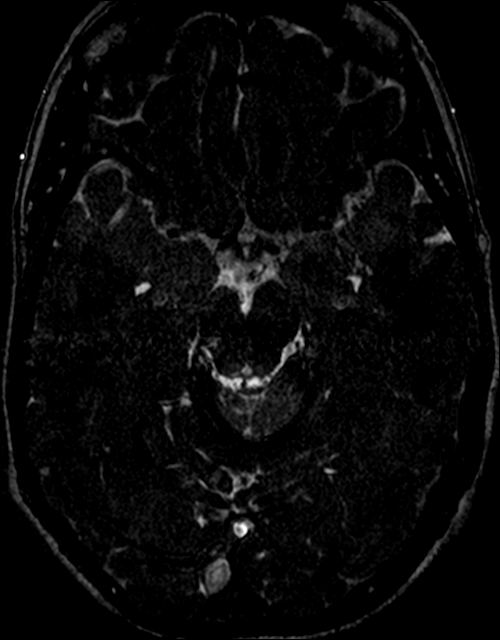

[Series 12: T1 post-contrast · coronal · 3.0mm · 0.35mm/px · 1 of 11 slices shown (1 of 2)]
[im 1/11]
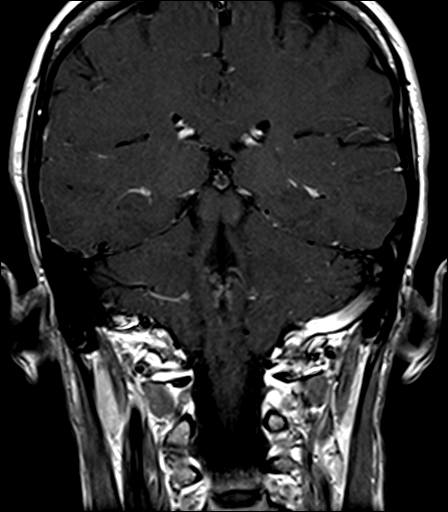

[Series 13: T1 post-contrast · axial · 3.0mm · 0.35mm/px · 1 of 11 slices shown (2 of 2)]
[im 1/11]
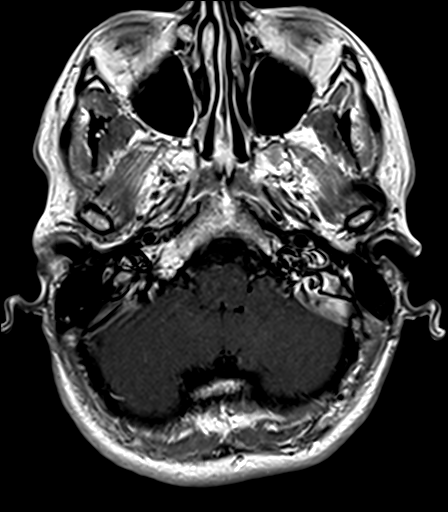

[Series 14: post_t1_mpr_tra · axial · 2.0mm · 0.45mm/px · 1 of 72 slices shown]
[im 1/72]
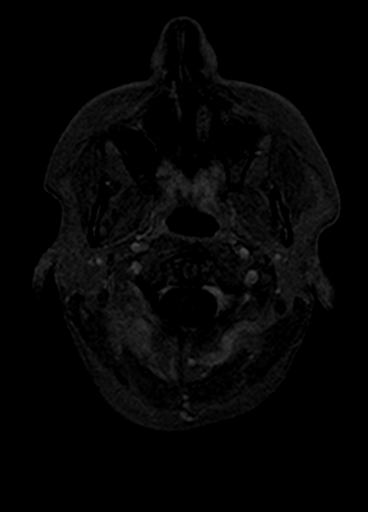

[38 of 48 positions shown; findings below may reference images not displayed]

FINDINGS: Brain: The brain has a normal appearance without evidence of
malformation, atrophy, old or acute small or large vessel
infarction, mass lesion, hemorrhage, hydrocephalus or extra-axial
collection. CP angle regions are normal. Seventh and eighth nerve
complexes are normal. No vestibular schwannoma or enhancing
neuritis. Inner ear structures appear normal by MRI. No abnormal
brain or leptomeningeal enhancement occurs elsewhere.

Vascular: Major vessels at the base of the brain show flow. Venous
sinuses appear patent.

Skull and upper cervical spine: Normal.

Sinuses/Orbits: Clear/normal.

Other: None significant.
IMPRESSION: Normal examination. No abnormality seen to explain sudden
sensorineural hearing loss on the right.

## 2021-01-06 MED ORDER — GADOBENATE DIMEGLUMINE 529 MG/ML IV SOLN
15.0000 mL | Freq: Once | INTRAVENOUS | Status: AC | PRN
  Administered 2021-01-06: 15 mL via INTRAVENOUS

## 2021-05-27 ENCOUNTER — Other Ambulatory Visit: Payer: Self-pay | Admitting: Obstetrics & Gynecology

## 2021-05-27 DIAGNOSIS — N649 Disorder of breast, unspecified: Secondary | ICD-10-CM

## 2021-05-27 DIAGNOSIS — N6310 Unspecified lump in the right breast, unspecified quadrant: Secondary | ICD-10-CM

## 2021-06-01 ENCOUNTER — Ambulatory Visit
Admission: RE | Admit: 2021-06-01 | Discharge: 2021-06-01 | Disposition: A | Discharge status: Home / Self Care | Payer: No Typology Code available for payment source | Source: Ambulatory Visit | Attending: Obstetrics & Gynecology | Admitting: Obstetrics & Gynecology

## 2021-06-01 ENCOUNTER — Ambulatory Visit: Payer: No Typology Code available for payment source

## 2021-06-01 ENCOUNTER — Other Ambulatory Visit: Payer: Self-pay | Admitting: Obstetrics & Gynecology

## 2021-06-01 ENCOUNTER — Other Ambulatory Visit: Payer: Self-pay

## 2021-06-01 DIAGNOSIS — N649 Disorder of breast, unspecified: Secondary | ICD-10-CM

## 2021-06-01 DIAGNOSIS — N6310 Unspecified lump in the right breast, unspecified quadrant: Secondary | ICD-10-CM

## 2021-06-01 IMAGING — US US BREAST*R* LIMITED INC AXILLA
1 series · 10 of 10 positions shown · non-contrast
Comparison: Previous exam(s).

CLINICAL DATA: Palpable lump in the right breast.

EXAM:
DIGITAL DIAGNOSTIC UNILATERAL RIGHT MAMMOGRAM WITH TOMOSYNTHESIS AND
CAD; ULTRASOUND RIGHT BREAST LIMITED
TECHNIQUE: Right digital diagnostic mammography and breast tomosynthesis was
performed. The images were evaluated with computer-aided detection.;
Targeted ultrasound examination of the right breast was performed

[Series 1: us breast*right* limited inc axilla · 0.07mm/px · 10 of 10 slices shown]
[im 1/10]
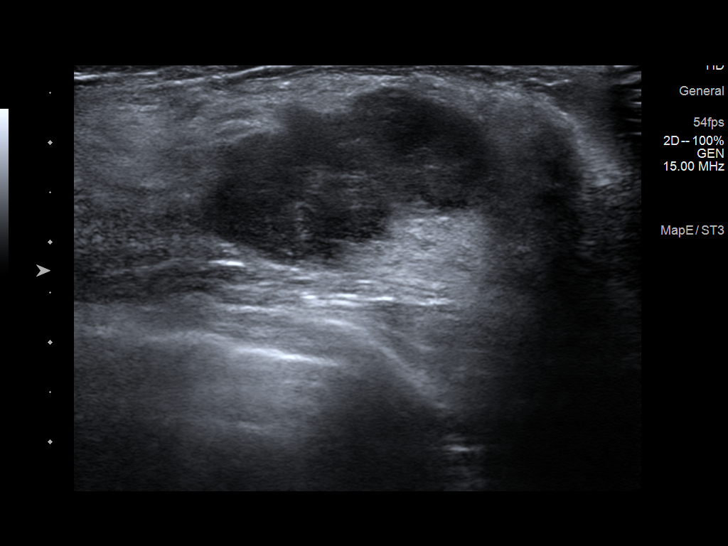
[im 2/10]
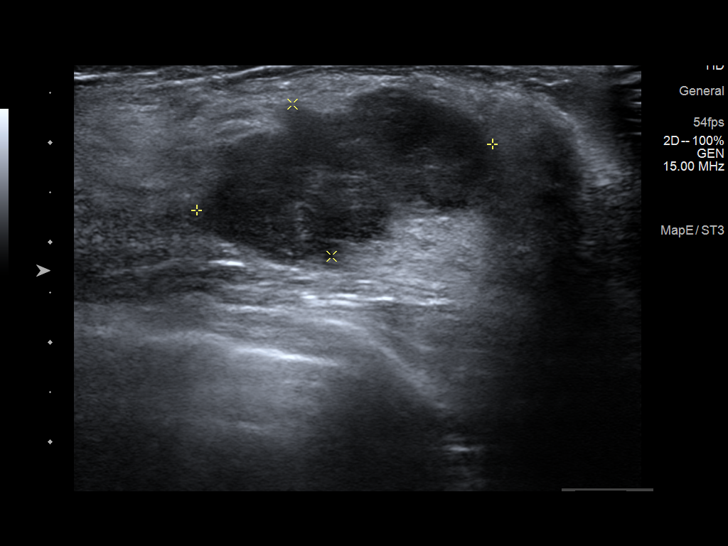
[im 3/10]
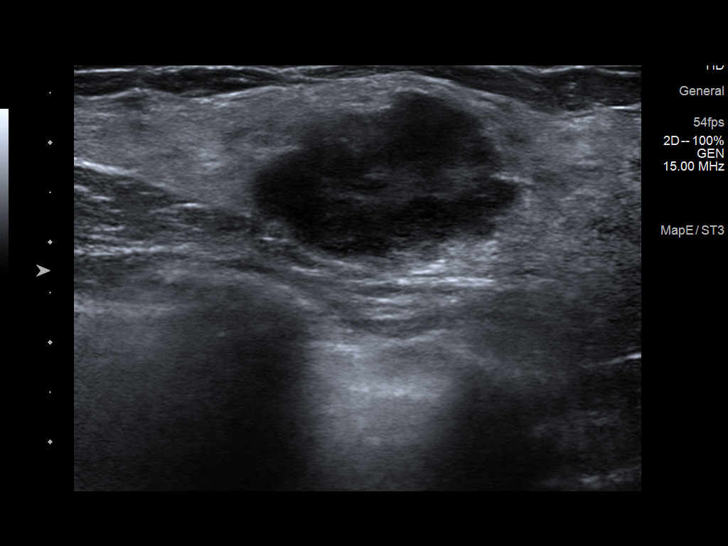
[im 4/10]
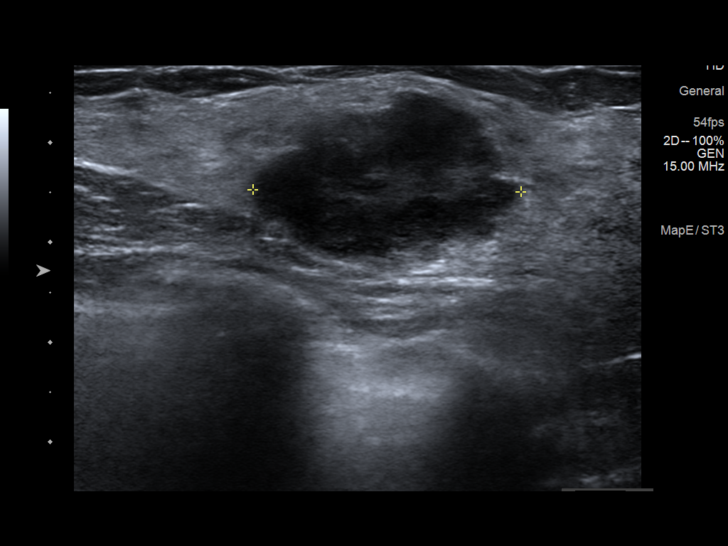
[im 5/10]
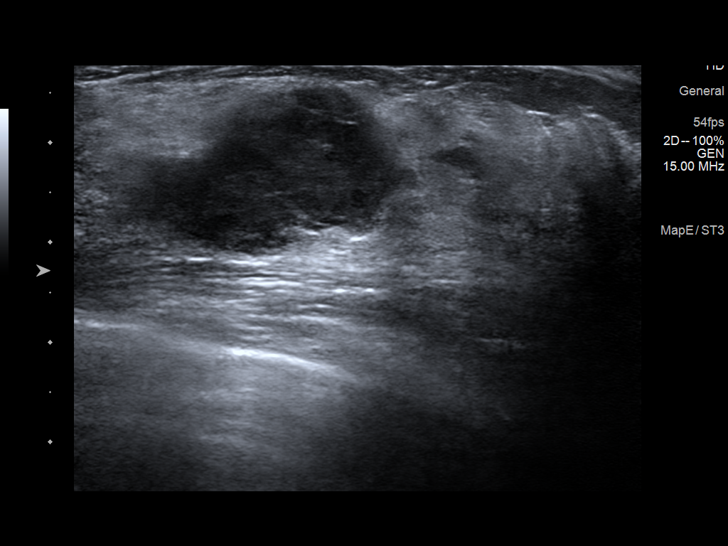
[im 6/10]
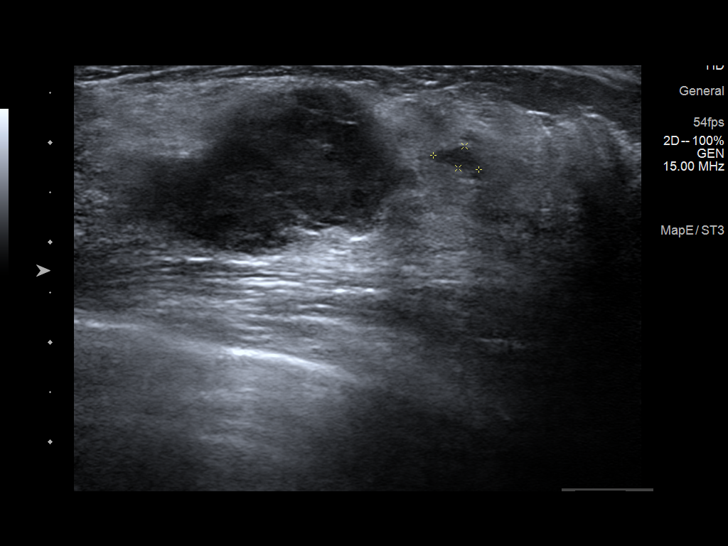
[im 7/10]
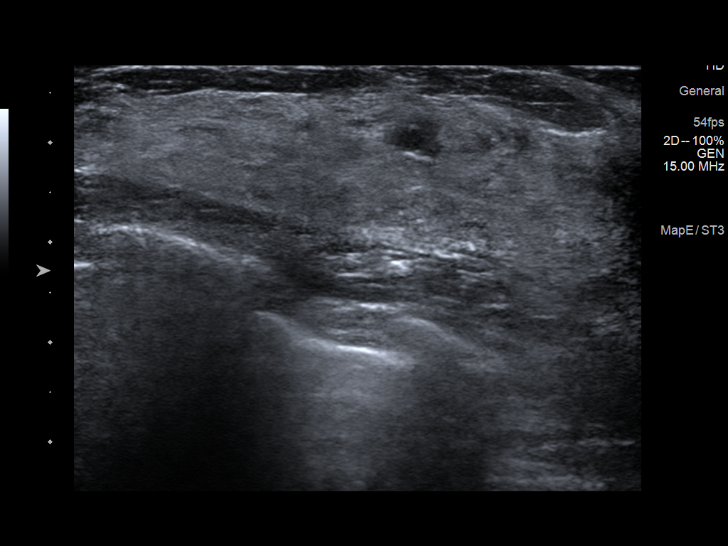
[im 8/10]
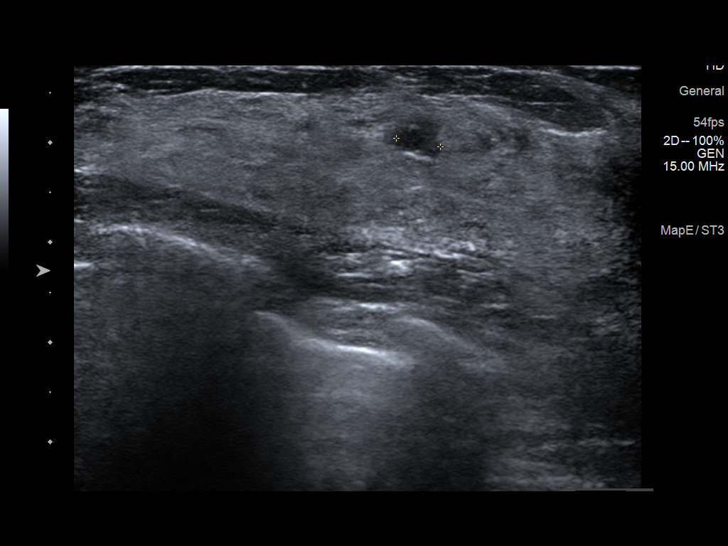
[im 9/10]
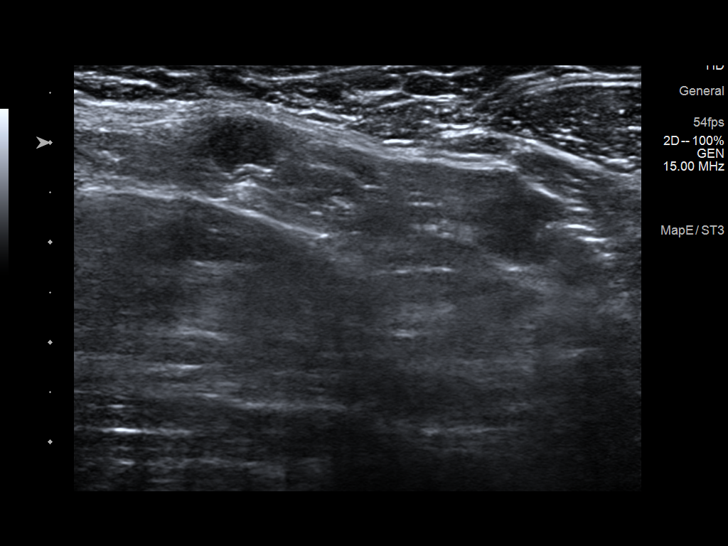
[im 10/10]
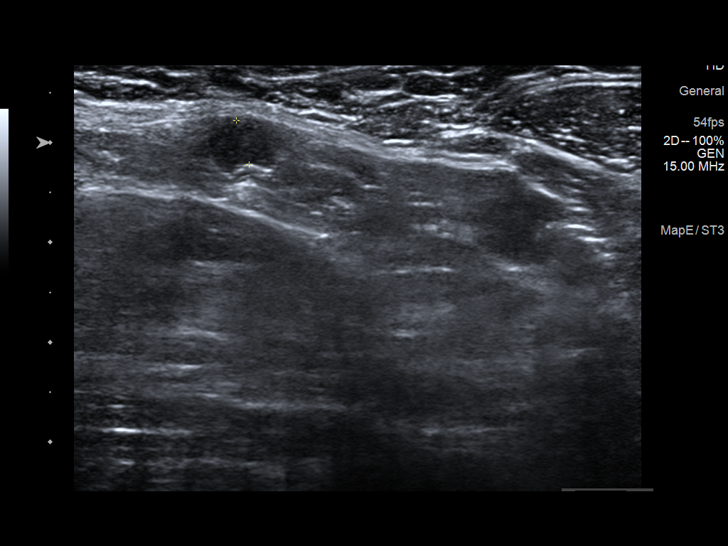

[10 of 10 positions shown; findings below may reference images not displayed]

ACR Breast Density Category d: The breast tissue is extremely dense,
which lowers the sensitivity of mammography.
FINDINGS: There is a mass in the medial superior right breast at the site of
the patient's palpable lump. No other suspicious mammographic
findings on the right

On physical exam, there is a lump in the right breast at 12 o'clock.

Targeted ultrasound is performed, showing an irregular mass in the
right breast at 12 o'clock, 2 cm from the nipple measuring 3.0 x
x 2.7 cm. There is a small adjacent mass measuring 5 x 2 x 5 mm
mm from the dominant mass. There is a single abnormal lymph node in
the right axilla with a cortex measuring nearly 5 mm.
IMPRESSION: 1. Suspicious mass in the right breast.  Possible satellite lesion.
2. Abnormal lymph node in the right axilla.

RECOMMENDATION:
Recommend ultrasound-guided biopsy of the dominant 12 o'clock right
breast mass and the adjacent satellite lesion. These masses can be
biopsied with 1 biopsy. Recommend placing a biopsy clip within the
right breast mass and the adjacent satellite lesion. Recommend
ultrasound-guided biopsy of the abnormal right axillary lymph node.

If the biopsy demonstrates malignancy, strongly recommend breast MRI
due to breast density and patient's family history.

The patient's mother was diagnosed with breast cancer in her 60s.
Her mother was diagnosed with the [WZ] gene. However, it is thought
the [WZ] gene was inherited from her mother's father. The patient's
maternal grandmother was diagnosed with breast cancer in her 40s.
The patient has been genetically tested and was negative. She was
tested in approximally [WZ]. The patient does not know her lifetime
risk of breast cancer.

Recommend determining whether the patient received panel testing. I
suspect she received a panel test if she was indeed tested in [WZ].
If the patient did not receive a panel test, recommend sending the
patient to genetic counseling for a new test. If the patient did
receive a panel test, recommend assessing the patient's lifetime
risk of breast cancer with the DIIAZ model. If the patient is
genetically positive or has a lifetime risk of greater than 20%,
recommend annual breast MRI. Also, if the patient is high risk,
recommend assessing the patient with the Gail model to determine
whether she would benefit from antiestrogen therapy.

I have discussed the findings and recommendations with the patient.
If applicable, a reminder letter will be sent to the patient
regarding the next appointment.

BI-RADS CATEGORY  4: Suspicious.

## 2021-06-01 IMAGING — MG MM DIGITAL DIAGNOSTIC UNILAT*R* W/ TOMO W/ CAD
6 series · 6 of 18 positions shown · non-contrast
Comparison: Previous exam(s).

CLINICAL DATA: Palpable lump in the right breast.

EXAM:
DIGITAL DIAGNOSTIC UNILATERAL RIGHT MAMMOGRAM WITH TOMOSYNTHESIS AND
CAD; ULTRASOUND RIGHT BREAST LIMITED
TECHNIQUE: Right digital diagnostic mammography and breast tomosynthesis was
performed. The images were evaluated with computer-aided detection.;
Targeted ultrasound examination of the right breast was performed

[R TAN synth-2D]
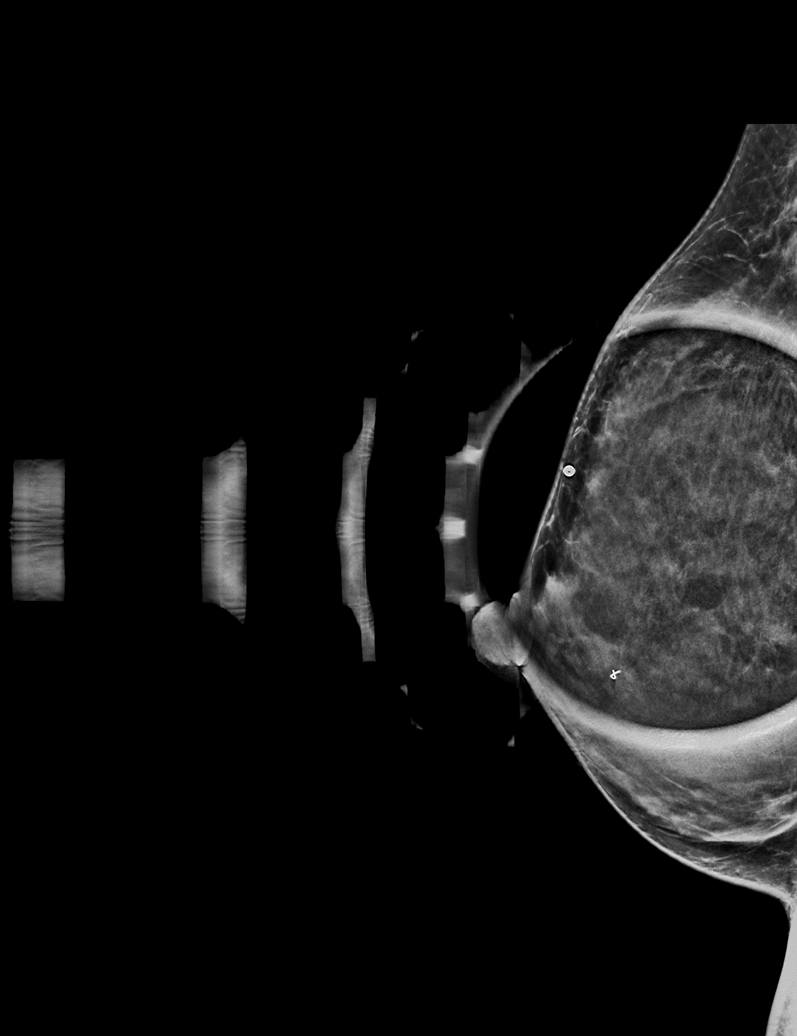

[R CC synth-2D]
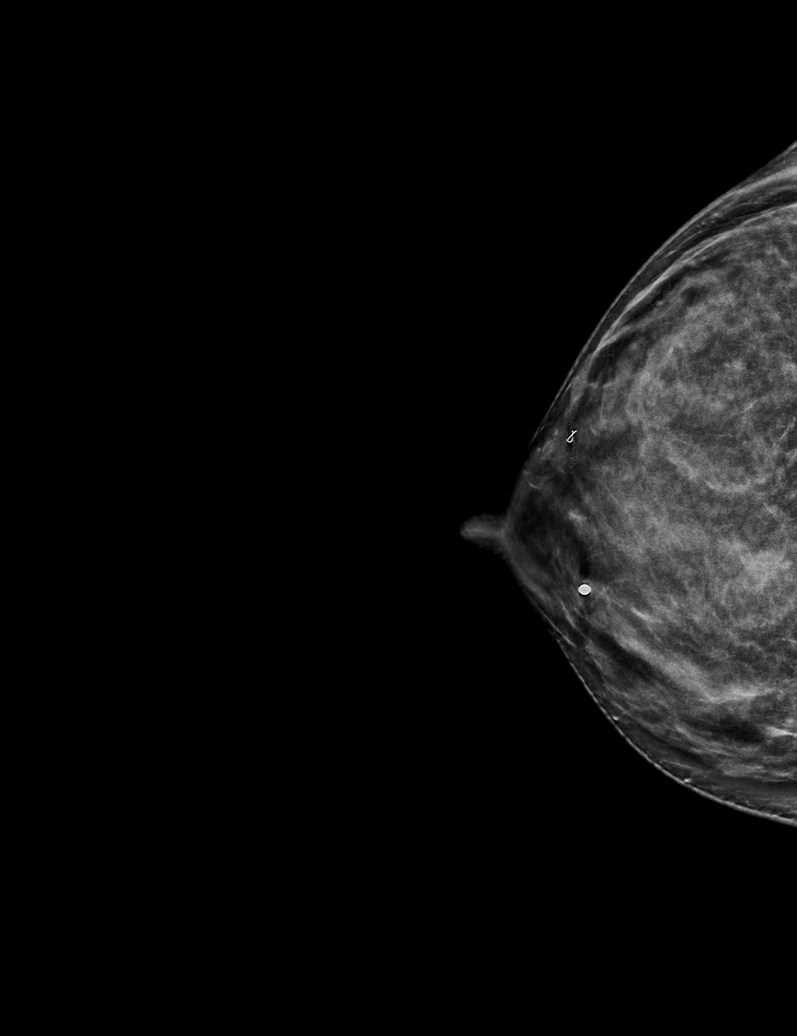

[R MLO synth-2D]
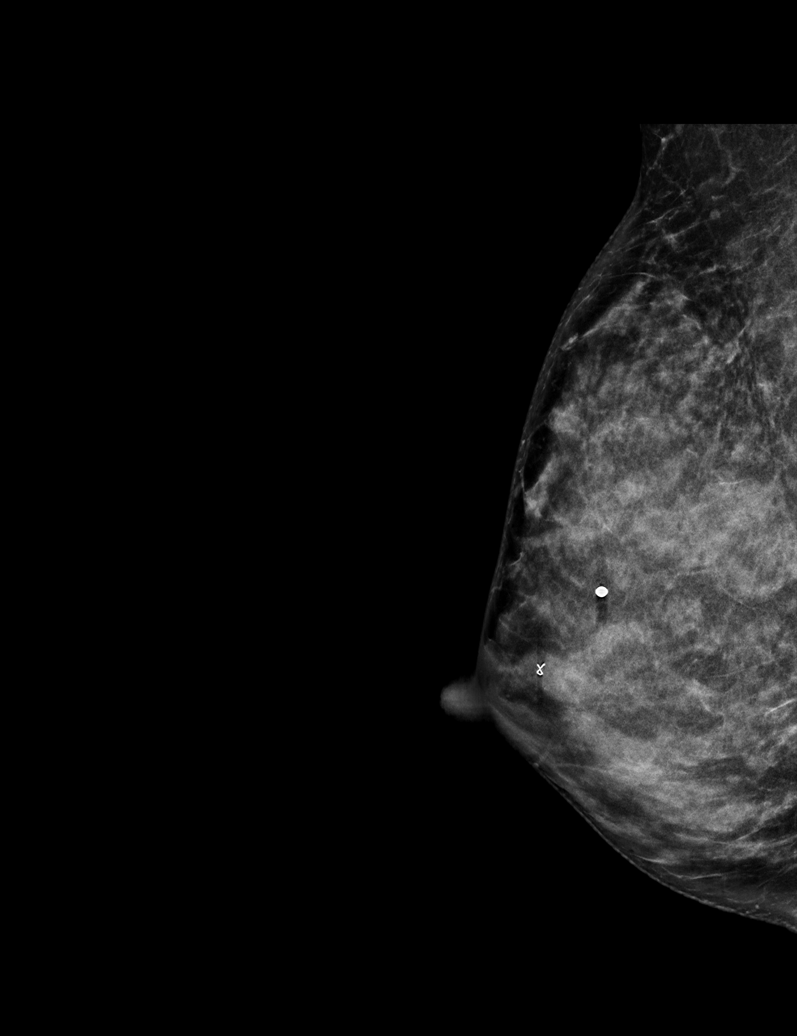

[R TAN tomo · tomo slice 27/54.0]
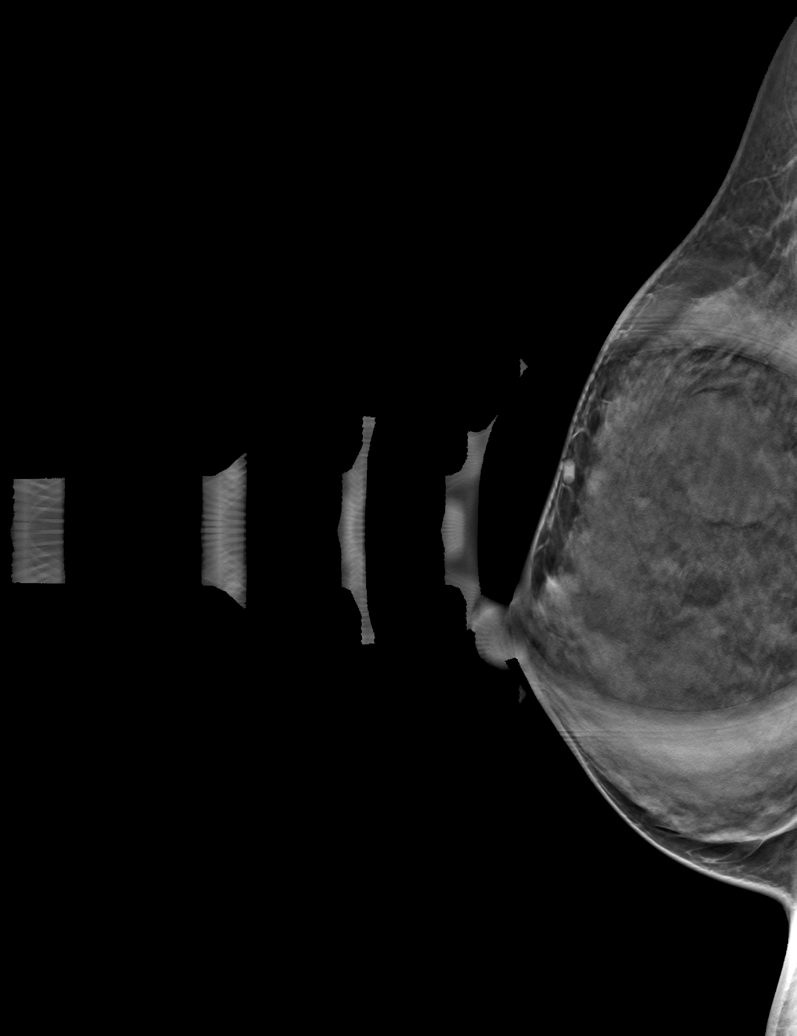

[R CC tomo · tomo slice 29/56.0]
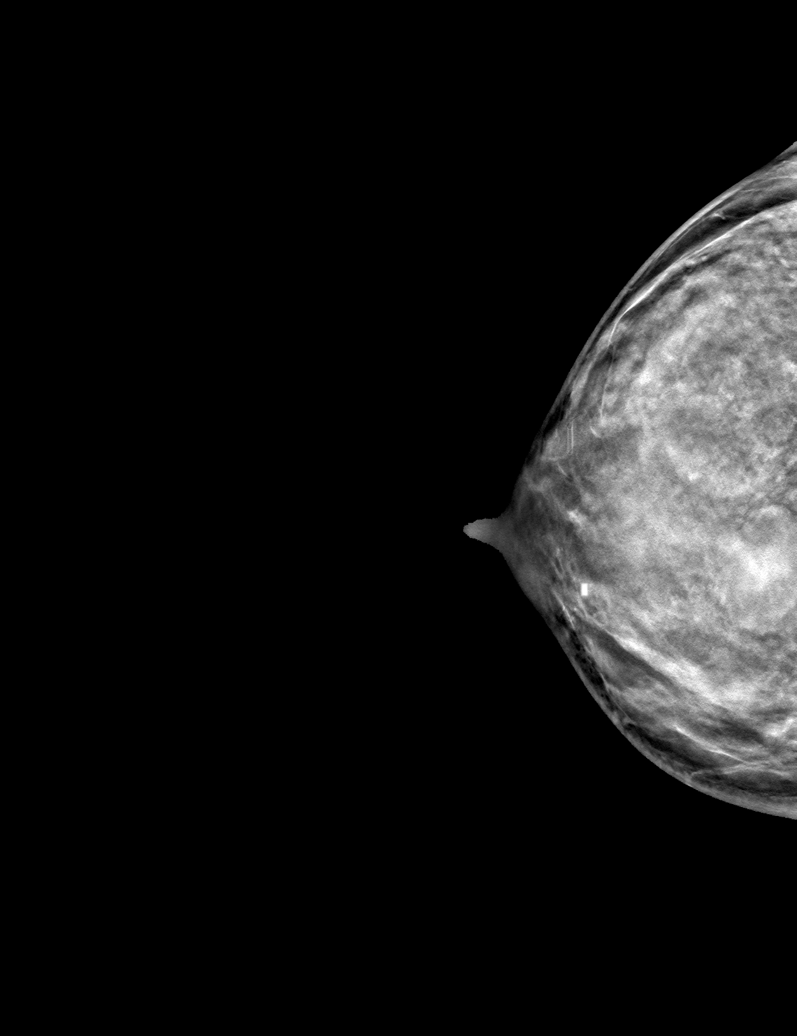

[R MLO tomo · tomo slice 26/51.0]
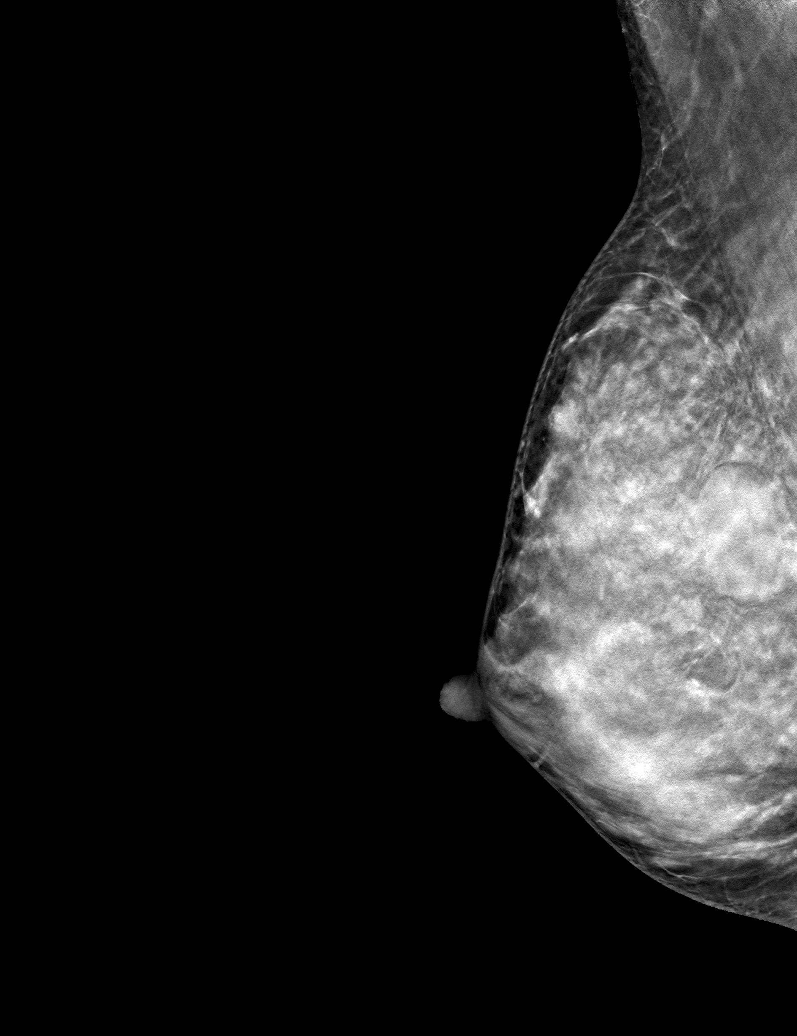

[6 of 18 positions shown; findings below may reference images not displayed]

ACR Breast Density Category d: The breast tissue is extremely dense,
which lowers the sensitivity of mammography.
FINDINGS: There is a mass in the medial superior right breast at the site of
the patient's palpable lump. No other suspicious mammographic
findings on the right

On physical exam, there is a lump in the right breast at 12 o'clock.

Targeted ultrasound is performed, showing an irregular mass in the
right breast at 12 o'clock, 2 cm from the nipple measuring 3.0 x
x 2.7 cm. There is a small adjacent mass measuring 5 x 2 x 5 mm
mm from the dominant mass. There is a single abnormal lymph node in
the right axilla with a cortex measuring nearly 5 mm.
IMPRESSION: 1. Suspicious mass in the right breast.  Possible satellite lesion.
2. Abnormal lymph node in the right axilla.

RECOMMENDATION:
Recommend ultrasound-guided biopsy of the dominant 12 o'clock right
breast mass and the adjacent satellite lesion. These masses can be
biopsied with 1 biopsy. Recommend placing a biopsy clip within the
right breast mass and the adjacent satellite lesion. Recommend
ultrasound-guided biopsy of the abnormal right axillary lymph node.

If the biopsy demonstrates malignancy, strongly recommend breast MRI
due to breast density and patient's family history.

The patient's mother was diagnosed with breast cancer in her 60s.
Her mother was diagnosed with the [WZ] gene. However, it is thought
the [WZ] gene was inherited from her mother's father. The patient's
maternal grandmother was diagnosed with breast cancer in her 40s.
The patient has been genetically tested and was negative. She was
tested in approximally [WZ]. The patient does not know her lifetime
risk of breast cancer.

Recommend determining whether the patient received panel testing. I
suspect she received a panel test if she was indeed tested in [WZ].
If the patient did not receive a panel test, recommend sending the
patient to genetic counseling for a new test. If the patient did
receive a panel test, recommend assessing the patient's lifetime
risk of breast cancer with the DIIAZ model. If the patient is
genetically positive or has a lifetime risk of greater than 20%,
recommend annual breast MRI. Also, if the patient is high risk,
recommend assessing the patient with the Gail model to determine
whether she would benefit from antiestrogen therapy.

I have discussed the findings and recommendations with the patient.
If applicable, a reminder letter will be sent to the patient
regarding the next appointment.

BI-RADS CATEGORY  4: Suspicious.

## 2021-06-04 ENCOUNTER — Ambulatory Visit
Admission: RE | Admit: 2021-06-04 | Discharge: 2021-06-04 | Disposition: A | Discharge status: Home / Self Care | Payer: No Typology Code available for payment source | Source: Ambulatory Visit | Attending: Obstetrics & Gynecology | Admitting: Obstetrics & Gynecology

## 2021-06-04 ENCOUNTER — Other Ambulatory Visit: Payer: Self-pay | Admitting: Obstetrics & Gynecology

## 2021-06-04 ENCOUNTER — Other Ambulatory Visit: Payer: Self-pay

## 2021-06-04 DIAGNOSIS — N6310 Unspecified lump in the right breast, unspecified quadrant: Secondary | ICD-10-CM

## 2021-06-04 DIAGNOSIS — N6320 Unspecified lump in the left breast, unspecified quadrant: Secondary | ICD-10-CM

## 2021-06-04 IMAGING — US US  BREAST BX W/ LOC DEV 1ST LESION IMG BX SPEC US GUIDE*R*
1 series · 16 of 25 positions shown · non-contrast
Comparison: Previous exam(s).
COMPARISON: Previous exam(s).

Addendum:
CLINICAL DATA: Patient presents for ultrasound-guided core biopsy
of RIGHT breast mass and enlarged RIGHT axillary lymph node.

EXAM:
US AXILLARY NODE CORE BIOPSY
ULTRASOUND RIGHT BREAST CORE NEEDLE BIOPSY

[Series 1: us breast bx w/ loc dev 1st lesion img bx spec us  · 0.06mm/px · 16 of 29 slices shown]
[im 1/29]
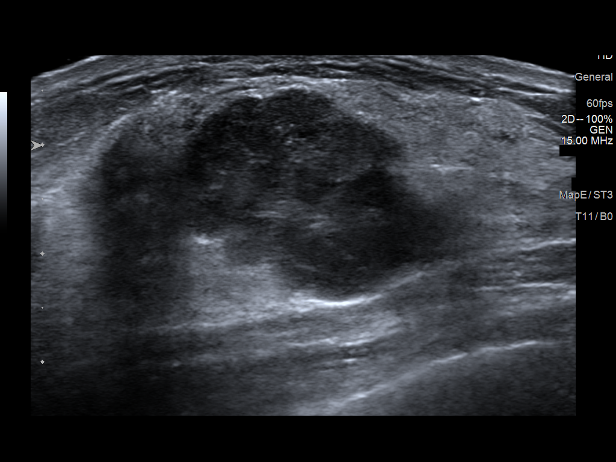
[im 3/29]
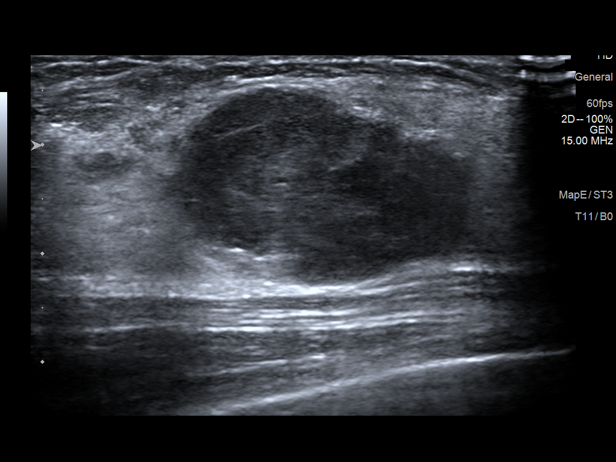
[im 4/29]
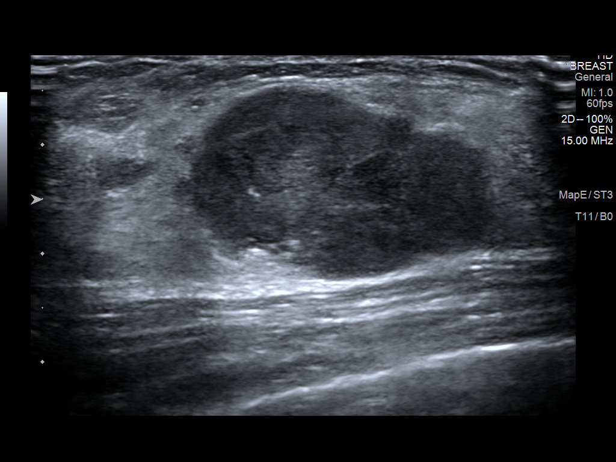
[im 6/29]
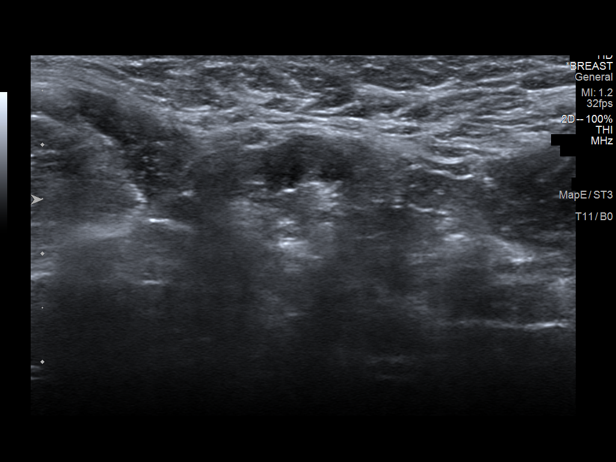
[im 9/29]
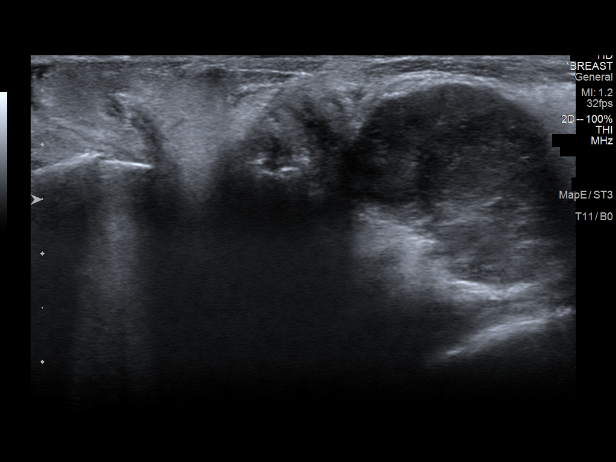
[im 10/29]
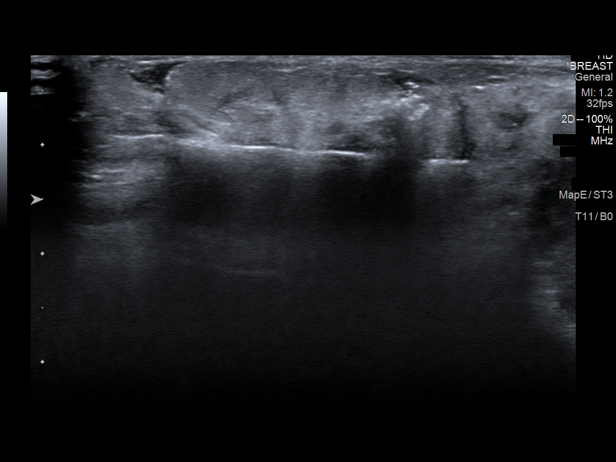
[im 12/29]
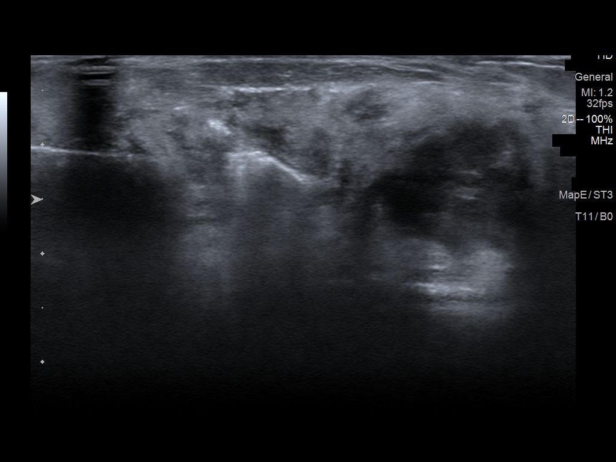
[im 13/29]
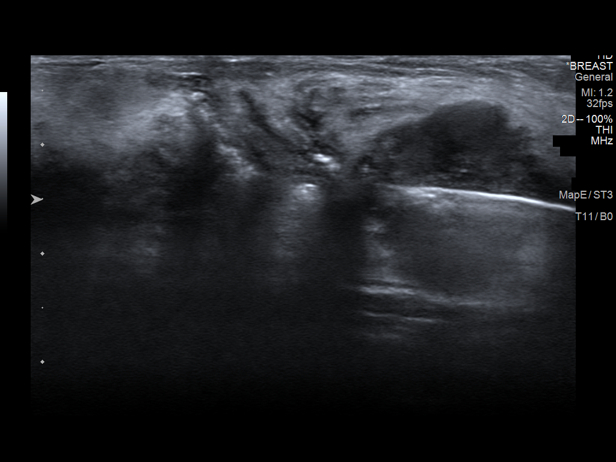
[im 16/29]
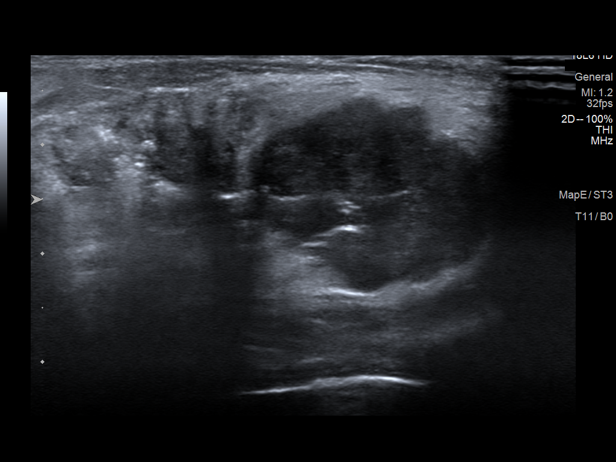
[im 17/29]
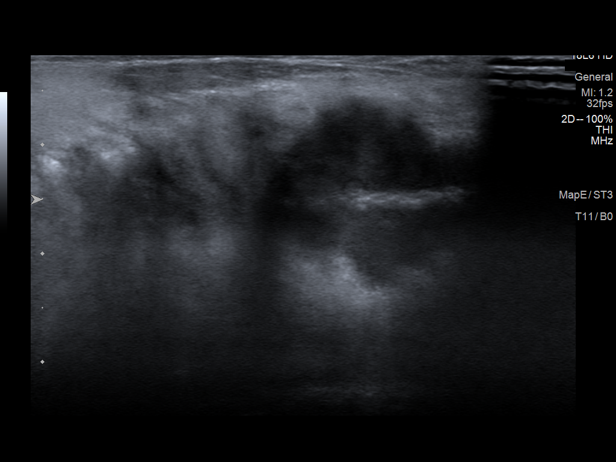
[im 19/29]
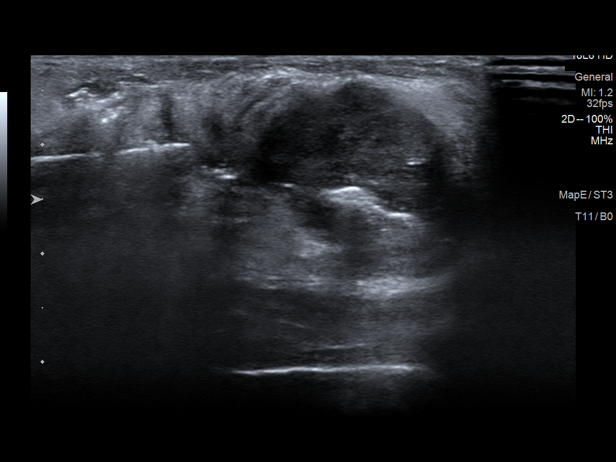
[im 20/29]
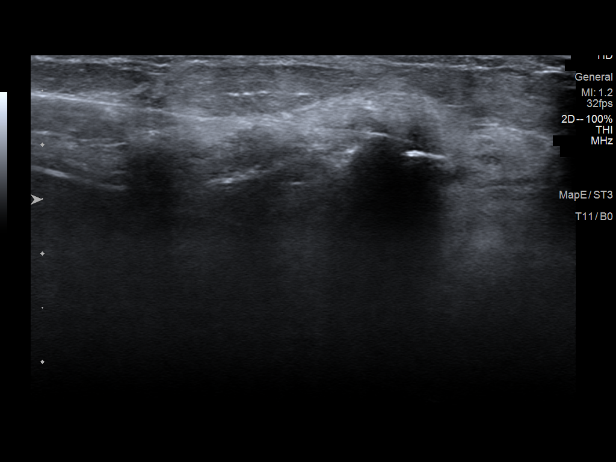
[im 23/29]
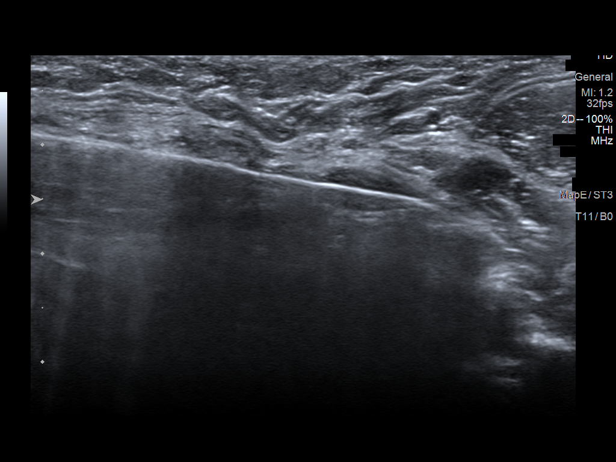
[im 25/29]
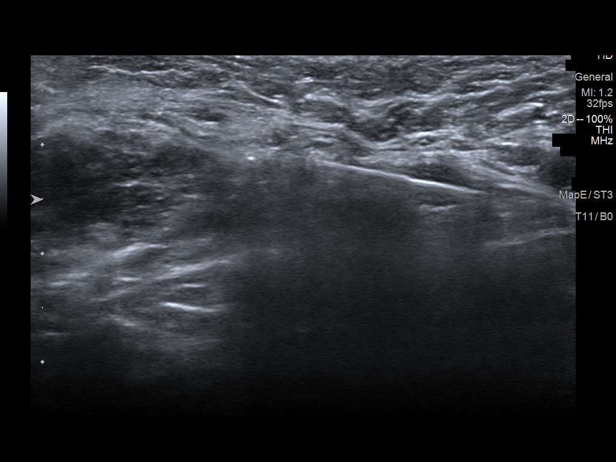
[im 26/29]
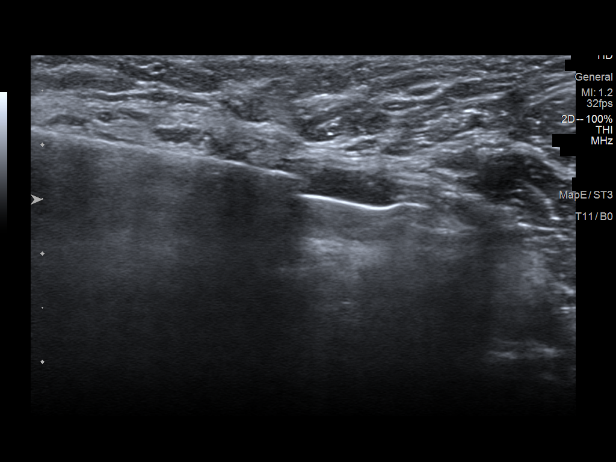
[im 29/29]
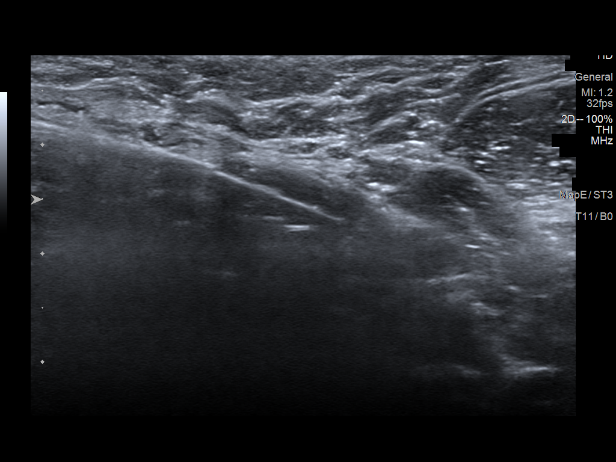

[16 of 25 positions shown; findings below may reference images not displayed]



Prior to the biopsy, ultrasound was performed for planning purposes.
During this exam, a small oval mass with indistinct margins is
identified in the 10 o'clock location of the RIGHT breast 5
centimeters from the nipple measuring 0.5 x 0.3 x 0.5 centimeters.

Site 1: RIGHT breast 12 o'clock. Lesion quadrant: 12 o'clock RIGHT
breast, coil clip

Using sterile technique and 1% lidocaine and 1% lidocaine with
epinephrine as local anesthetic, under direct ultrasound
visualization, a 12 gauge AMAAN device was used to perform
biopsy of mass in the 12 o'clock location of the RIGHT breast using
a MEDIAL approach. At the conclusion of the procedure coil shaped
tissue marker clip was deployed into the biopsy cavity.

Following biopsy, a heart shaped clip was deployed into the small
satellite lesion along the superolateral aspect of the dominant
mass. This lesion was not sampled individually.

Site 2: RIGHT axilla.  Lesion quadrant: RIGHT axilla, Tribell clip

Using sterile technique and 1% lidocaine as local anesthetic, under
direct ultrasound visualization, a 14 gauge AMAAN device was
used to perform biopsy of enlarged RIGHT axillary lymph node using a
LATERAL approach. At the conclusion of the procedure Tribell tissue
marker clip was deployed into the biopsy cavity.

Follow-up 2-view mammogram was performed and dictated separately.
IMPRESSION: Ultrasound guided biopsy of mass in the 12 o'clock location of the
RIGHT breast and enlarged RIGHT axillary lymph node. No apparent
complications.

ADDENDUM:
Pathology revealed GRADE III INVASIVE DUCTAL CARCINOMA of the RIGHT
breast, 12 o'clock, coil clip. This was found to be concordant by
Dr. AMAAN.

Following biopsy, a heart shaped clip was deployed into the small
satellite lesion along the superolateral aspect of the dominant
mass. This lesion was not sampled individually.

Pathology revealed LYMPH NODE TISSUE NEGATIVE FOR METASTATIC
CARCINOMA of the RIGHT axilla, tribell clip. This was found to be
concordant by Dr. AMAAN.

Pathology results were discussed with the patient by telephone. The
patient reported doing well after the biopsies with tenderness at
the sites. Post biopsy instructions and care were reviewed and
questions were answered. The patient was encouraged to call The

The patient was referred to [REDACTED]
[REDACTED] at [REDACTED] on
[DATE].

Breast MRI recommended for extent of disease.

Pathology results reported by AMAAN RN on [DATE].



Prior to the biopsy, ultrasound was performed for planning purposes.
During this exam, a small oval mass with indistinct margins is
identified in the 10 o'clock location of the RIGHT breast 5
centimeters from the nipple measuring 0.5 x 0.3 x 0.5 centimeters.

Site 1: RIGHT breast 12 o'clock. Lesion quadrant: 12 o'clock RIGHT
breast, coil clip

Using sterile technique and 1% lidocaine and 1% lidocaine with
epinephrine as local anesthetic, under direct ultrasound
visualization, a 12 gauge AMAAN device was used to perform
biopsy of mass in the 12 o'clock location of the RIGHT breast using
a MEDIAL approach. At the conclusion of the procedure coil shaped
tissue marker clip was deployed into the biopsy cavity.

Following biopsy, a heart shaped clip was deployed into the small
satellite lesion along the superolateral aspect of the dominant
mass. This lesion was not sampled individually.

Site 2: RIGHT axilla.  Lesion quadrant: RIGHT axilla, Tribell clip

Using sterile technique and 1% lidocaine as local anesthetic, under
direct ultrasound visualization, a 14 gauge AMAAN device was
used to perform biopsy of enlarged RIGHT axillary lymph node using a
LATERAL approach. At the conclusion of the procedure Tribell tissue
marker clip was deployed into the biopsy cavity.

Follow-up 2-view mammogram was performed and dictated separately.
IMPRESSION: Ultrasound guided biopsy of mass in the 12 o'clock location of the
RIGHT breast and enlarged RIGHT axillary lymph node. No apparent
complications.

## 2021-06-04 IMAGING — MG MM BREAST LOCALIZATION CLIP
6 series · 6 of 18 positions shown · non-contrast
Comparison: Previous exam(s).

CLINICAL DATA: Status post ultrasound-guided core biopsy of RIGHT
axillary mass in RIGHT breast mass. Clip was placed in a small
satellite lesion.

EXAM:
3D DIAGNOSTIC RIGHT MAMMOGRAM POST ULTRASOUND BIOPSY x2

[R ML synth-2D (1 of 2)]
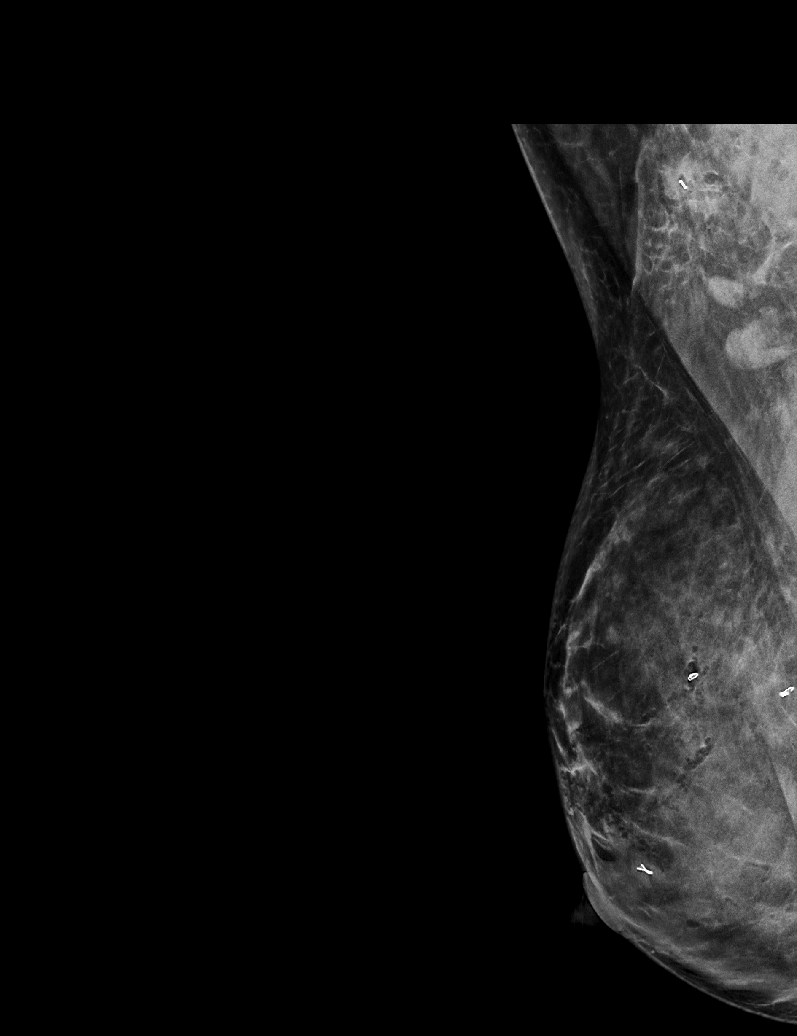

[R ML synth-2D (2 of 2)]
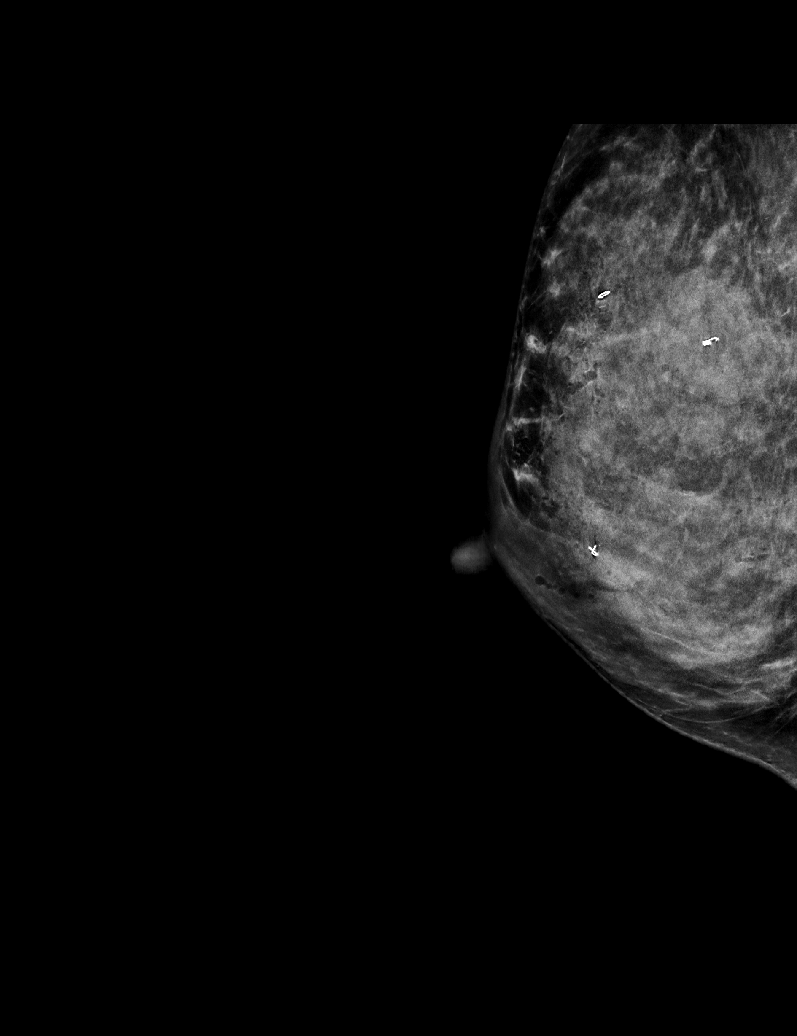

[R CC synth-2D]
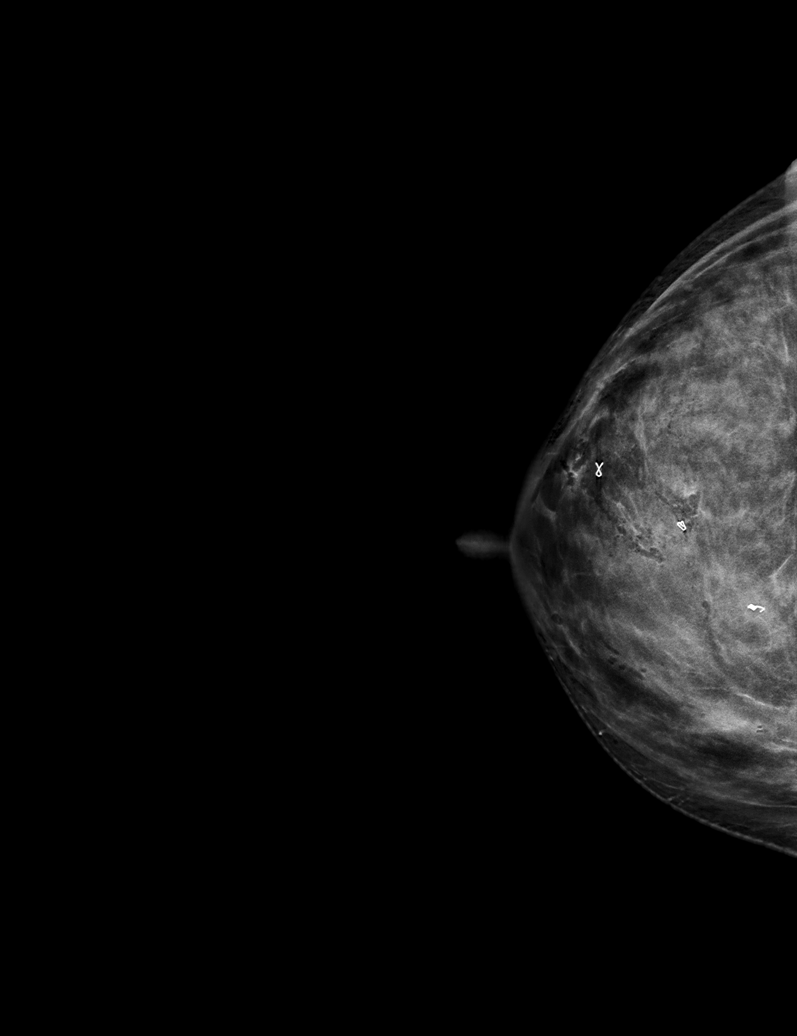

[R ML tomo (1 of 2) · tomo slice 35/70.0]
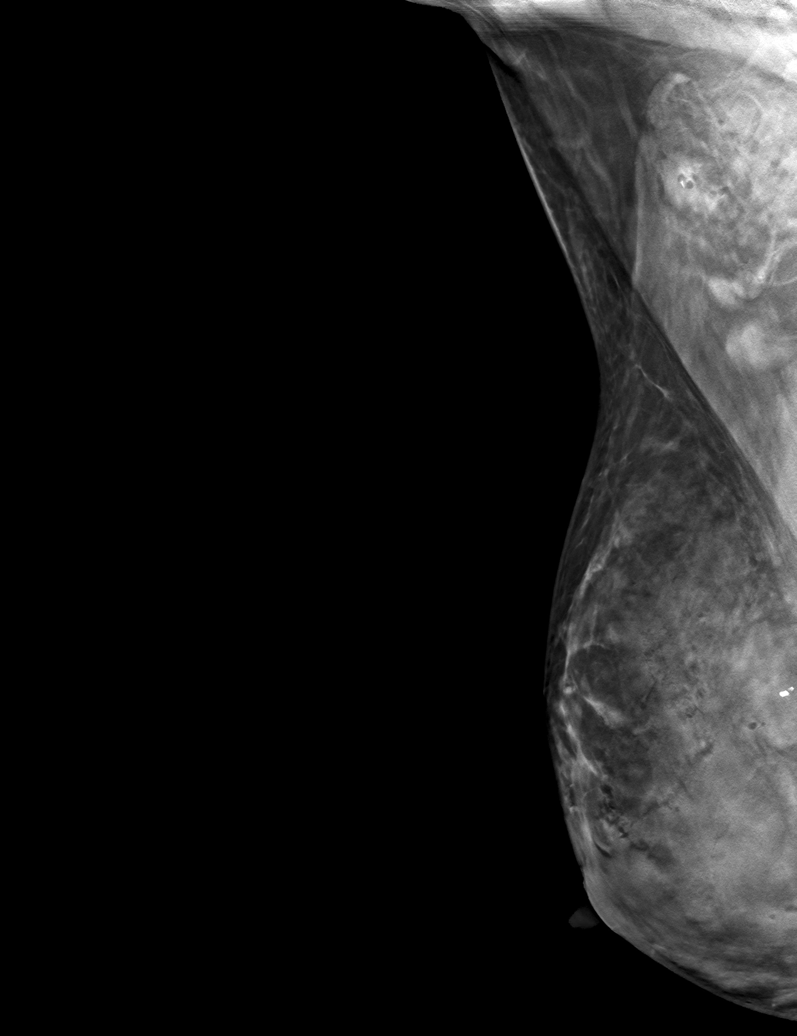

[R CC tomo · tomo slice 35/69.0]
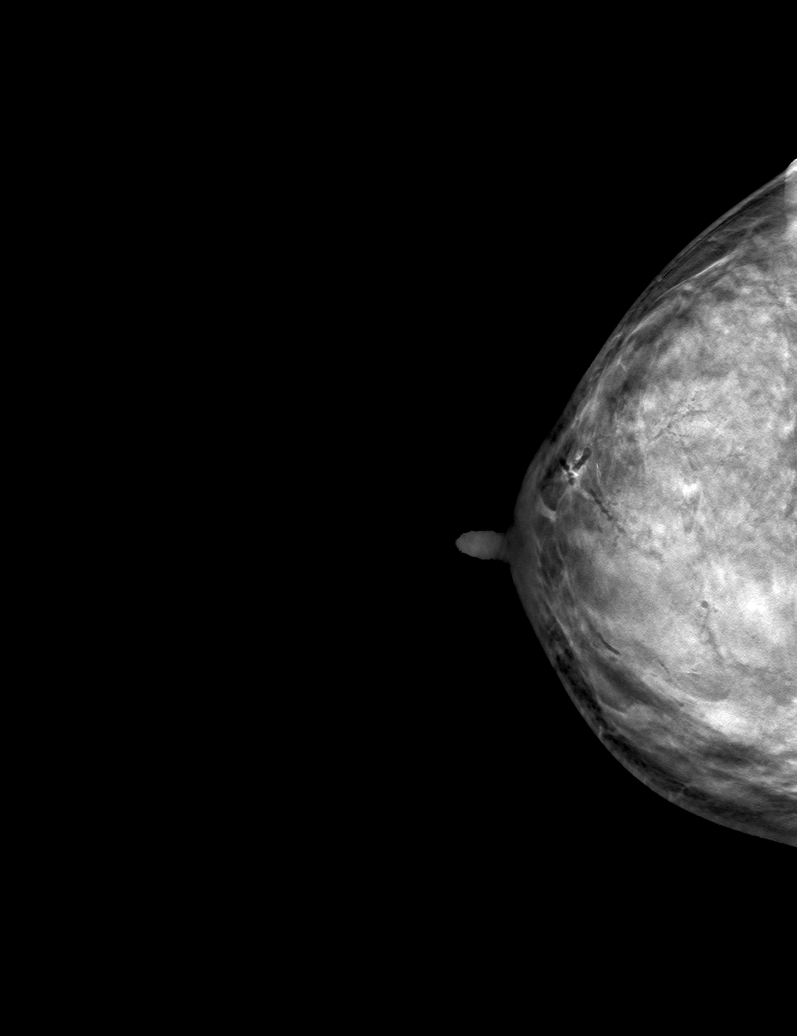

[R ML tomo (2 of 2) · tomo slice 29/56.0]
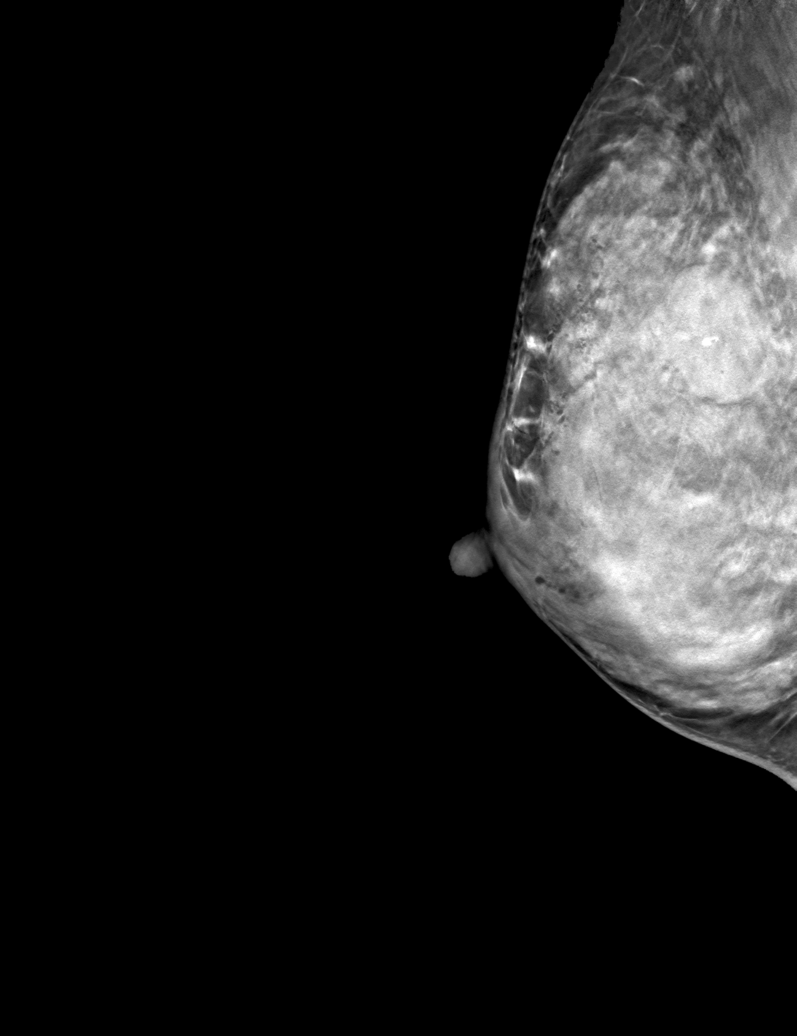

[6 of 18 positions shown; findings below may reference images not displayed]

FINDINGS: 3D Mammographic images were obtained following ultrasound guided
biopsy of mass in the 12 o'clock location of the RIGHT breast and
placement of a coil shaped clip. The biopsy marking clip is in
expected position at the site of biopsy.

A heart shaped clip was placed within a satellite nodule along the
superolateral aspect of the index lesion. The heart shaped clip is
in the expected location.

Following ultrasound biopsy of RIGHT axillary lymph node, a tribell
clip was placed and is identified in the expected location.

The heart and coil shaped clips are 2.5 centimeters apart on the
craniocaudal projection. A ribbon shaped clip was placed at the time
of a [AF] biopsy showing benign fibrosis.
IMPRESSION: Tissue marker clips are in the expected locations after biopsy.

Final Assessment: Post Procedure Mammograms for Marker Placement

## 2021-06-08 ENCOUNTER — Encounter: Payer: Self-pay | Admitting: *Deleted

## 2021-06-08 ENCOUNTER — Ambulatory Visit: Payer: No Typology Code available for payment source

## 2021-06-08 ENCOUNTER — Other Ambulatory Visit: Payer: Self-pay | Admitting: *Deleted

## 2021-06-08 ENCOUNTER — Ambulatory Visit
Admission: RE | Admit: 2021-06-08 | Discharge: 2021-06-08 | Disposition: A | Discharge status: Home / Self Care | Payer: No Typology Code available for payment source | Source: Ambulatory Visit | Attending: Obstetrics & Gynecology | Admitting: Obstetrics & Gynecology

## 2021-06-08 ENCOUNTER — Other Ambulatory Visit: Payer: Self-pay

## 2021-06-08 DIAGNOSIS — C50411 Malignant neoplasm of upper-outer quadrant of right female breast: Secondary | ICD-10-CM | POA: Insufficient documentation

## 2021-06-08 DIAGNOSIS — Z170 Estrogen receptor positive status [ER+]: Secondary | ICD-10-CM

## 2021-06-08 DIAGNOSIS — N6320 Unspecified lump in the left breast, unspecified quadrant: Secondary | ICD-10-CM

## 2021-06-08 IMAGING — MG MM DIGITAL DIAGNOSTIC UNILAT*L* W/ TOMO W/ CAD
4 series · 4 of 12 positions shown · non-contrast
Comparison: Previous exam(s).

CLINICAL DATA: The patient reports that she had a red, flaky area
on the skin in the medial periareolar left breast beginning in [DATE] that subsequently resolved 1 month ago. She currently has no
left breast problems. Recently diagnosed malignancy in the right
breast.

EXAM:
DIGITAL DIAGNOSTIC UNILATERAL LEFT MAMMOGRAM WITH TOMOSYNTHESIS AND
CAD
TECHNIQUE: Left digital diagnostic mammography and breast tomosynthesis was
performed. The images were evaluated with computer-aided detection.

[L CC synth-2D]
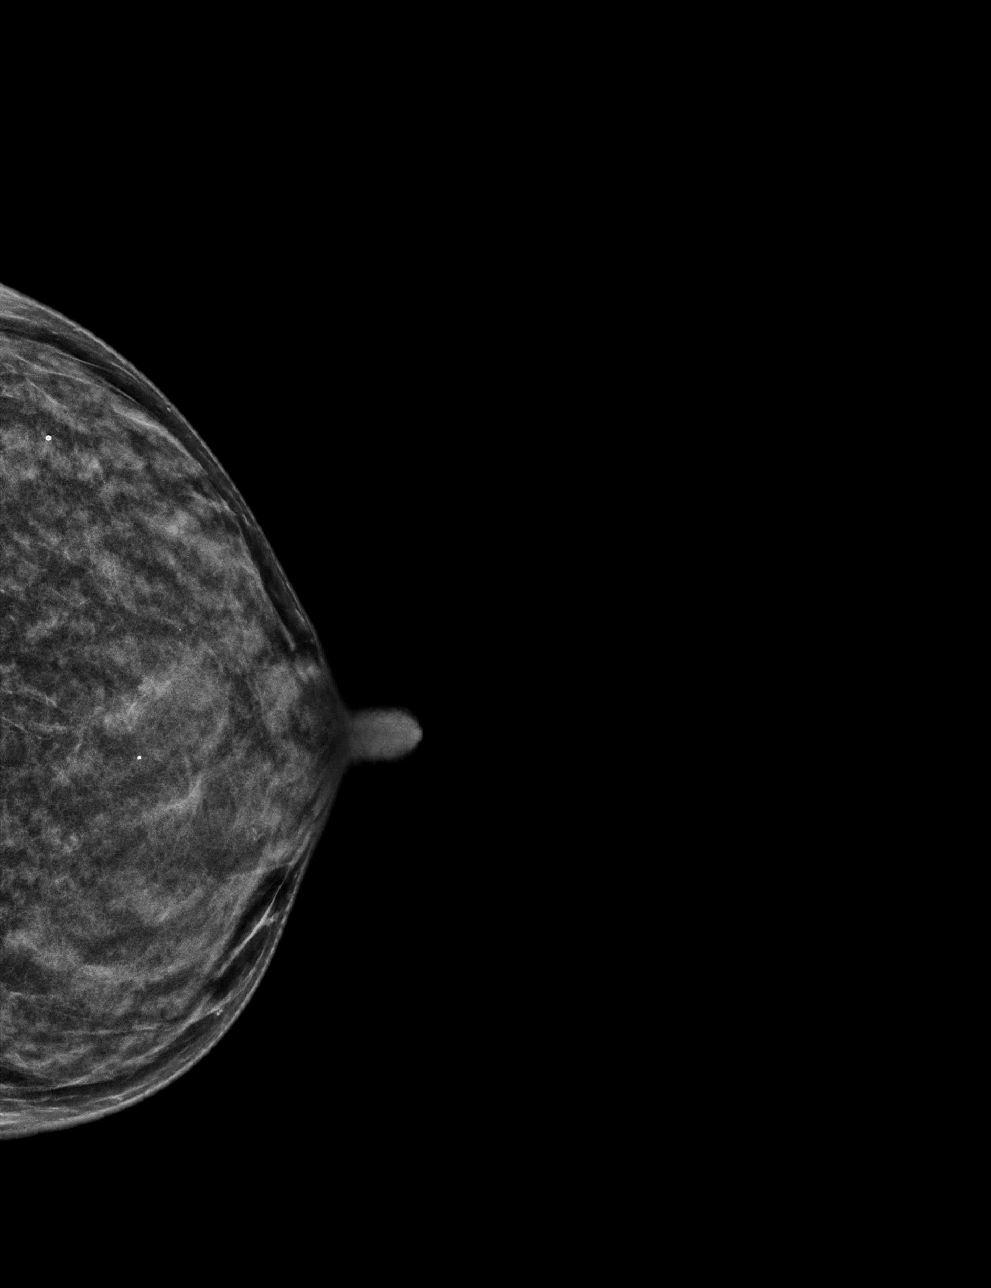

[L MLO synth-2D]
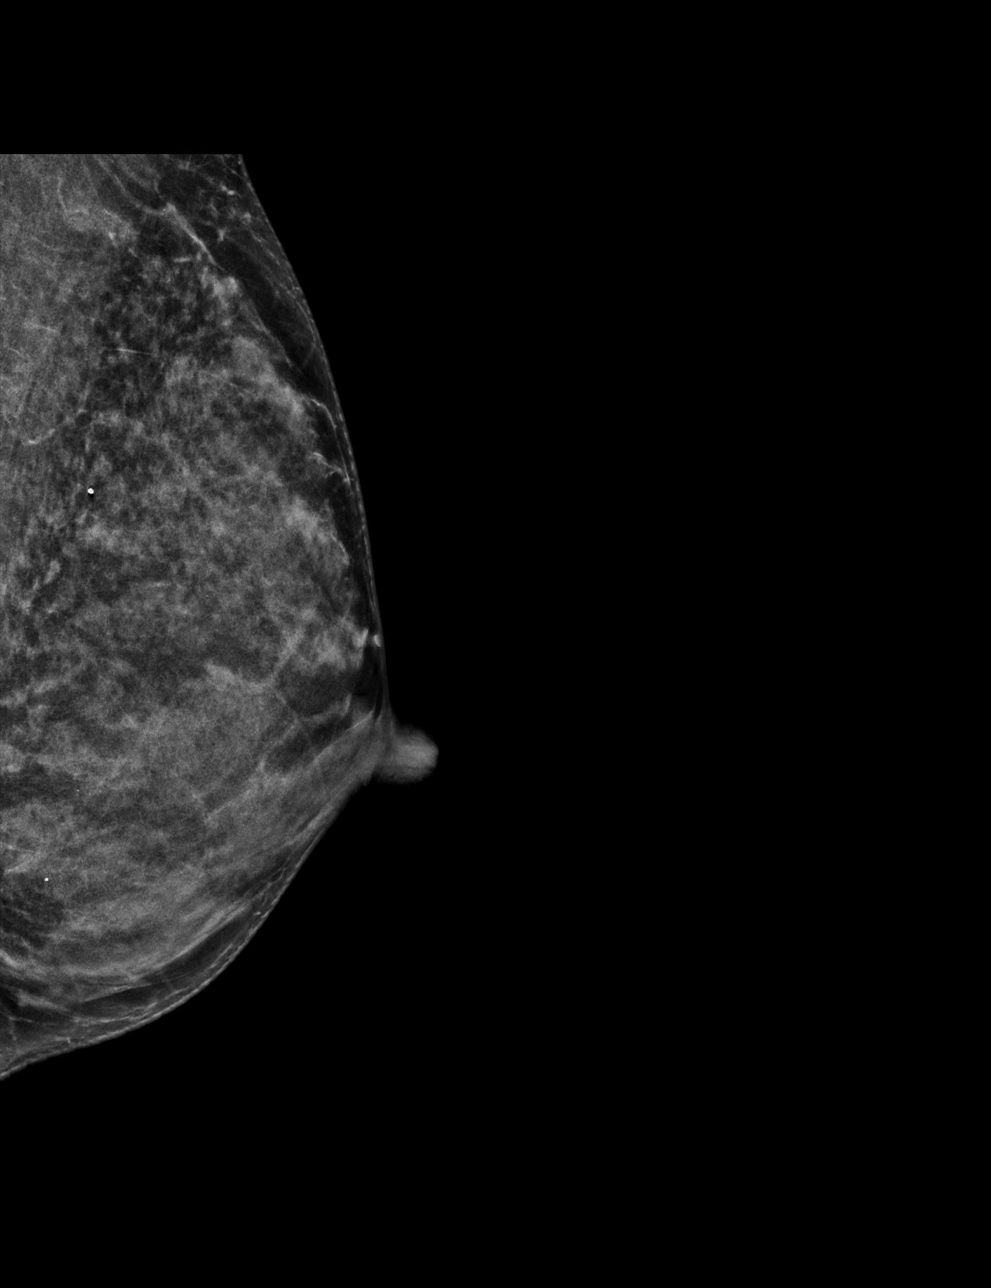

[L CC tomo · tomo slice 21/41.0]
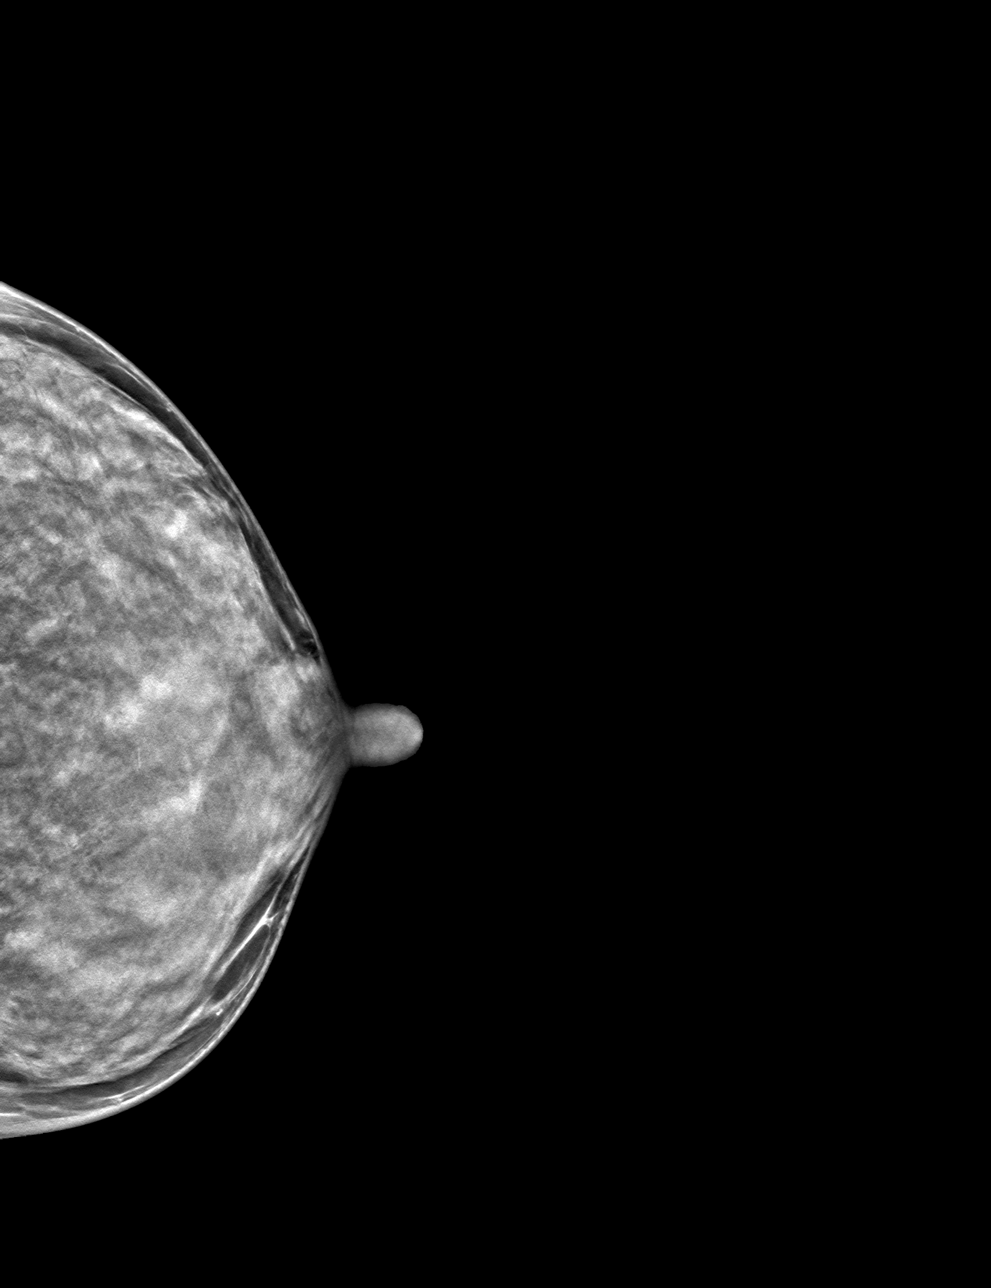

[L MLO tomo · tomo slice 22/43.0]
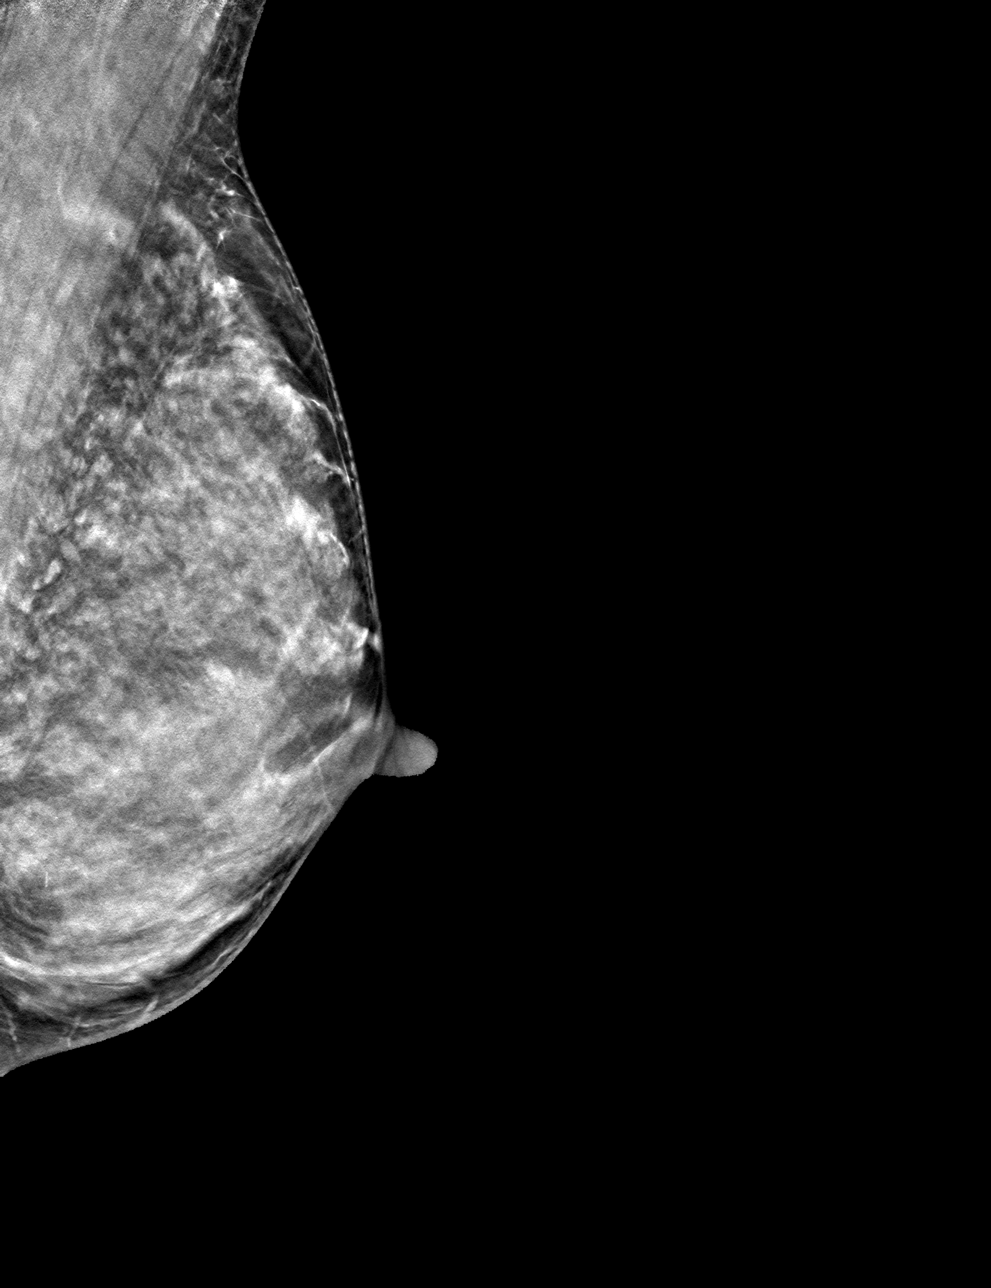

[4 of 12 positions shown; findings below may reference images not displayed]

ACR Breast Density Category d: The breast tissue is extremely dense,
which lowers the sensitivity of mammography.
FINDINGS: Stable mammographic appearance of the left breast with no findings
suspicious for malignancy.

On physical examination, the medial periareolar left breast skin has
a normal appearance at the location of the previous redness and
flaking with no palpable abnormality.
IMPRESSION: No evidence of malignancy.

RECOMMENDATION:
Treatment plan for the patient's recently diagnosed right breast
cancer.

I have discussed the findings and recommendations with the patient.
If applicable, a reminder letter will be sent to the patient
regarding the next appointment.

BI-RADS CATEGORY  1: Negative.

## 2021-06-09 ENCOUNTER — Other Ambulatory Visit: Payer: Self-pay | Admitting: Obstetrics & Gynecology

## 2021-06-09 DIAGNOSIS — C50919 Malignant neoplasm of unspecified site of unspecified female breast: Secondary | ICD-10-CM

## 2021-06-09 NOTE — Progress Notes (Signed)
Radiation Oncology         [phone]) [phone] ________________________________  Initial Outpatient Consultation  Name: BRAILEE RIEDE MRN: [id number]  Date: 06/10/2021  DOB: [birth year]  [id number], Onalee Hua, MD  Abigail Miyamoto, MD   REFERRING PHYSICIAN: Abigail Miyamoto, MD  DIAGNOSIS:    ICD-10-CM   1. Malignant neoplasm of upper-outer quadrant of right breast in female, estrogen receptor positive (HCC)  C50.411    Z17.0     Cancer Staging Malignant neoplasm of upper-outer quadrant of right breast in female, estrogen receptor positive (HCC) Staging form: Breast, AJCC 8th Edition - Clinical stage from 06/10/2021: Stage IIA (cT2, cN0, cM0, G3, ER+, PR+, HER2-) - Signed by Lowella Dell, MD on 06/10/2021 Stage prefix: Initial diagnosis Histologic grading system: 3 grade system Laterality: Right Staged by: Pathologist and managing physician Stage used in treatment planning: Yes National guidelines used in treatment planning: Yes Type of national guideline used in treatment planning: NCCN  Stage IIA (cT2, cN0, cM0) Right Breast UOQ Invasive Ductal Carcinoma, ER+ / PR+ / Her2-, Grade 3  CHIEF COMPLAINT: Here to discuss management of right breast cancer  HISTORY OF PRESENT ILLNESS::Ilamae D Abruzzese is a 45 y.o. female who presented with a suspicious mass in the right breast measuring 3.0 cm in the greatest dimension, and abnormal right axillary lymph nodes on the following imaging: diagnostic right breast mammogram and right breast ultrasound on the date of 06/01/21.  Symptoms, if any, at that time, were: a palpable lump in the right breast.  Right breast biopsy at the 12 o'clock position on date of 06/04/21 showed invasive ductal carcinoma measuring 1.2 cm in the greatest tumor dimension.  ER status: 15%, positive; PR status 15%, both with weak-moderate staining intensity; Ki67 70%; Her2 status negative; Grade 3. Biopsy of right axillary lymph node also performed at this time revealed no evidence  of carcinoma.   Of note: the patient reported a red flaky area on the skin in the medial periareolar left breast area beginning in April 2022 that subsequently resolved 1 month ago. Given her recent right breast cancer diagnosis, left breast diagnostic mammogram was performed on 06/08/21 and revealed  no evidence of malignancy.   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  She is here with her significant other who appears very supportive.  She has 2 children.  She has lived in [place] for little over a decade.  PREVIOUS RADIATION THERAPY: No  PAST MEDICAL HISTORY:  has a past medical history of Abnormal Pap smear (09/13/2000), Allergy (02/2020), Depression, H/O varicella, Hypertension (09/14/2011), No pertinent past medical history, and Preterm labor.    PAST SURGICAL HISTORY: Past Surgical History:  Procedure Laterality Date   BREAST BIOPSY Right 07/27/2019   CESAREAN SECTION  11/14/2011   Procedure: CESAREAN SECTION;  Surgeon: Janine Limbo, MD;  Location: [facility];  Service: Gynecology;  Laterality: N/A;  Primary cesarean section with delivery of baby boy at [phone]. Apgars 9/9.   CESAREAN SECTION N/A 03/22/2014   Procedure: Repeat CESAREAN SECTION;  Surgeon: Esmeralda Arthur, MD;  Location: [facility];  Service: Obstetrics;  Laterality: N/A;   COSMETIC SURGERY  11/22/2017   Abdominoplasty   FOOT SURGERY     FRACTURE SURGERY  08/1999   Repair of 5th metatarsal in left foot   HERNIA REPAIR  11/22/2017   TUBAL LIGATION  11/22/2017   Tubes were completely removed   WISDOM TOOTH EXTRACTION  FAMILY HISTORY: family history includes Arthritis in her mother; Birth defects in her son; Breast cancer in her maternal grandmother; Breast cancer (age of onset: 14) in her mother; Depression in her father; Heart disease in her maternal grandfather; Hyperlipidemia in her brother and father; Hypertension in her brother and father; Kidney disease in  her father; Melanoma (age of onset: 40) in her father; Osteopenia in her mother; Other in her maternal grandfather and mother; Ovarian cancer in her paternal grandmother; Stroke in her paternal grandfather.  SOCIAL HISTORY:  reports that she has never smoked. She has never used smokeless tobacco. She reports current alcohol use of about 7.0 standard drinks per week. She reports that she does not use drugs.  ALLERGIES: Patient has no known allergies.  MEDICATIONS:  Current Outpatient Medications  Medication Sig Dispense Refill   dexamethasone (DECADRON) 4 MG tablet Take 2 tablets (8 mg total) by mouth daily. Take daily for 3 days after chemo. Take with food. 30 tablet 1   fluticasone (FLONASE) 50 MCG/ACT nasal spray Place 1-2 sprays into both nostrils daily for 7 days. 1 g 0   ibuprofen (ADVIL,MOTRIN) 600 MG tablet Take 1 tablet (600 mg total) by mouth every 6 (six) hours as needed for fever, headache, mild pain, moderate pain or cramping. 30 tablet 2   lidocaine-prilocaine (EMLA) cream Apply to affected area once 30 g 3   LORazepam (ATIVAN) 0.5 MG tablet Take 1 tablet (0.5 mg total) by mouth at bedtime as needed (Nausea or vomiting). 30 tablet 0   oxyCODONE-acetaminophen (PERCOCET/ROXICET) 5-325 MG per tablet Take 1-2 tablets by mouth every 4 (four) hours as needed for moderate pain or severe pain (moderate - severe pain). 40 tablet 0   Prenatal Vit-Fe Fumarate-FA (PRENATAL MULTIVITAMIN) TABS Take 1 tablet by mouth daily. 30 tablet 11   prochlorperazine (COMPAZINE) 10 MG tablet Take 1 tablet (10 mg total) by mouth every 6 (six) hours as needed (Nausea or vomiting). 30 tablet 1   No current facility-administered medications for this encounter.   Facility-Administered Medications Ordered in Other Encounters  Medication Dose Route Frequency Provider Last Rate Last Admin   influenza vac split quadrivalent PF (FLUARIX) 0.5 ML injection            influenza vac split quadrivalent PF (FLUARIX)  injection 0.5 mL  0.5 mL Intramuscular Once Magrinat, Virgie Dad, MD        REVIEW OF SYSTEMS: As above in HPI.   PHYSICAL EXAM:   General: Alert and oriented, in no acute distress HEENT: Head is normocephalic. Extraocular movements are intact. Oropharynx is clear. Neck: Neck is supple, no palpable cervical or supraclavicular lymphadenopathy. Heart: Regular in rate and rhythm with no murmurs, rubs, or gallops. Chest: Clear to auscultation bilaterally, with no rhonchi, wheezes, or rales. Extremities: No cyanosis or edema. Lymphatics: see Neck Exam Skin: No concerning lesions. Musculoskeletal: symmetric strength and muscle tone throughout. Neurologic:  No obvious focalities. Speech is fluent. Coordination is intact. Psychiatric: Judgment and insight are intact. Affect is appropriate. Breasts: Right breast is notable for a 3 cm mass at 12:00. No other palpable masses appreciated in the breasts or axillae bilaterally.   ECOG = 0  0 - Asymptomatic (Fully active, able to carry on all predisease activities without restriction)  1 - Symptomatic but completely ambulatory (Restricted in physically strenuous activity but ambulatory and able to carry out work of a light or sedentary nature. For example, light housework, office work)  2 - Symptomatic, <50% in bed  during the day (Ambulatory and capable of all self care but unable to carry out any work activities. Up and about more than 50% of waking hours)  3 - Symptomatic, >50% in bed, but not bedbound (Capable of only limited self-care, confined to bed or chair 50% or more of waking hours)  4 - Bedbound (Completely disabled. Cannot carry on any self-care. Totally confined to bed or chair)  5 - Death   Eustace Pen MM, Creech RH, Tormey DC, et al. [phone]). "Toxicity and response criteria of the [facility]". [place] Oncol. 5 (6): 649-55   LABORATORY DATA:  Lab Results  Component Value Date   WBC 5.8 06/10/2021   HGB 14.4  06/10/2021   HCT 42.8 06/10/2021   MCV 93.2 06/10/2021   PLT 287 06/10/2021   CMP     Component Value Date/Time   NA 141 06/10/2021 0838   K 4.3 06/10/2021 0838   CL 107 06/10/2021 0838   CO2 24 06/10/2021 0838   GLUCOSE 87 06/10/2021 0838   BUN 9 06/10/2021 0838   CREATININE 0.79 06/10/2021 0838   CALCIUM 9.6 06/10/2021 0838   PROT 7.3 06/10/2021 0838   ALBUMIN 4.4 06/10/2021 0838   AST 18 06/10/2021 0838   ALT 15 06/10/2021 0838   ALKPHOS 49 06/10/2021 0838   BILITOT 0.8 06/10/2021 0838   GFRNONAA >60 06/10/2021 0838   GFRAA >90 11/26/2011 2008         RADIOGRAPHY: US BREAST LTD UNI RIGHT INC AXILLA  Result Date: 06/01/2021 CLINICAL DATA:  Palpable lump in the right breast. EXAM: DIGITAL DIAGNOSTIC UNILATERAL RIGHT MAMMOGRAM WITH TOMOSYNTHESIS AND CAD; ULTRASOUND RIGHT BREAST LIMITED TECHNIQUE: Right digital diagnostic mammography and breast tomosynthesis was performed. The images were evaluated with computer-aided detection.; Targeted ultrasound examination of the right breast was performed COMPARISON:  Previous exam(s). ACR Breast Density Category d: The breast tissue is extremely dense, which lowers the sensitivity of mammography. FINDINGS: There is a mass in the medial superior right breast at the site of the patient's palpable lump. No other suspicious mammographic findings on the right On physical exam, there is a lump in the right breast at 12 o'clock. Targeted ultrasound is performed, showing an irregular mass in the right breast at 12 o'clock, 2 cm from the nipple measuring 3.0 x 1.6 x 2.7 cm. There is a small adjacent mass measuring 5 x 2 x 5 mm 3.5 mm from the dominant mass. There is a single abnormal lymph node in the right axilla with a cortex measuring nearly 5 mm. IMPRESSION: 1. Suspicious mass in the right breast.  Possible satellite lesion. 2. Abnormal lymph node in the right axilla. RECOMMENDATION: Recommend ultrasound-guided biopsy of the dominant 12 o'clock right  breast mass and the adjacent satellite lesion. These masses can be biopsied with 1 biopsy. Recommend placing a biopsy clip within the right breast mass and the adjacent satellite lesion. Recommend ultrasound-guided biopsy of the abnormal right axillary lymph node. If the biopsy demonstrates malignancy, strongly recommend breast MRI due to breast density and patient's family history. The patient's mother was diagnosed with breast cancer in her 36s. Her mother was diagnosed with the BRCA2 gene. However, it is thought the BRCA2 gene was inherited from her mother's father. The patient's maternal grandmother was diagnosed with breast cancer in her 87s. The patient has been genetically tested and was negative. She was tested in approximally 2015. The patient does not know her lifetime risk of breast cancer. Recommend determining  whether the patient received panel testing. I suspect she received a panel test if she was indeed tested in 2015. If the patient did not receive a panel test, recommend sending the patient to genetic counseling for a new test. If the patient did receive a panel test, recommend assessing the patient's lifetime risk of breast cancer with the [place]. If the patient is genetically positive or has a lifetime risk of greater than 20%, recommend annual breast MRI. Also, if the patient is high risk, recommend assessing the patient with the [place] to determine whether she would benefit from antiestrogen therapy. I have discussed the findings and recommendations with the patient. If applicable, a reminder letter will be sent to the patient regarding the next appointment. BI-RADS CATEGORY  4: Suspicious. Electronically Signed   By: Dorise Bullion III M.D.   On: 06/01/2021 16:32  MM DIAG BREAST TOMO UNI LEFT  Result Date: 06/08/2021 CLINICAL DATA:  The patient reports that she had a red, flaky area on the skin in the medial periareolar left breast beginning in April 2022 that subsequently  resolved 1 month ago. She currently has no left breast problems. Recently diagnosed malignancy in the right breast. EXAM: DIGITAL DIAGNOSTIC UNILATERAL LEFT MAMMOGRAM WITH TOMOSYNTHESIS AND CAD TECHNIQUE: Left digital diagnostic mammography and breast tomosynthesis was performed. The images were evaluated with computer-aided detection. COMPARISON:  Previous exam(s). ACR Breast Density Category d: The breast tissue is extremely dense, which lowers the sensitivity of mammography. FINDINGS: Stable mammographic appearance of the left breast with no findings suspicious for malignancy. On physical examination, the medial periareolar left breast skin has a normal appearance at the location of the previous redness and flaking with no palpable abnormality. IMPRESSION: No evidence of malignancy. RECOMMENDATION: Treatment plan for the patient's recently diagnosed right breast cancer. I have discussed the findings and recommendations with the patient. If applicable, a reminder letter will be sent to the patient regarding the next appointment. BI-RADS CATEGORY  1: Negative. Electronically Signed   By: Claudie Revering M.D.   On: 06/08/2021 09:08  MM DIAG BREAST TOMO UNI RIGHT  Result Date: 06/01/2021 CLINICAL DATA:  Palpable lump in the right breast. EXAM: DIGITAL DIAGNOSTIC UNILATERAL RIGHT MAMMOGRAM WITH TOMOSYNTHESIS AND CAD; ULTRASOUND RIGHT BREAST LIMITED TECHNIQUE: Right digital diagnostic mammography and breast tomosynthesis was performed. The images were evaluated with computer-aided detection.; Targeted ultrasound examination of the right breast was performed COMPARISON:  Previous exam(s). ACR Breast Density Category d: The breast tissue is extremely dense, which lowers the sensitivity of mammography. FINDINGS: There is a mass in the medial superior right breast at the site of the patient's palpable lump. No other suspicious mammographic findings on the right On physical exam, there is a lump in the right breast at 12  o'clock. Targeted ultrasound is performed, showing an irregular mass in the right breast at 12 o'clock, 2 cm from the nipple measuring 3.0 x 1.6 x 2.7 cm. There is a small adjacent mass measuring 5 x 2 x 5 mm 3.5 mm from the dominant mass. There is a single abnormal lymph node in the right axilla with a cortex measuring nearly 5 mm. IMPRESSION: 1. Suspicious mass in the right breast.  Possible satellite lesion. 2. Abnormal lymph node in the right axilla. RECOMMENDATION: Recommend ultrasound-guided biopsy of the dominant 12 o'clock right breast mass and the adjacent satellite lesion. These masses can be biopsied with 1 biopsy. Recommend placing a biopsy clip within the right breast mass and the  adjacent satellite lesion. Recommend ultrasound-guided biopsy of the abnormal right axillary lymph node. If the biopsy demonstrates malignancy, strongly recommend breast MRI due to breast density and patient's family history. The patient's mother was diagnosed with breast cancer in her 39s. Her mother was diagnosed with the BRCA2 gene. However, it is thought the BRCA2 gene was inherited from her mother's father. The patient's maternal grandmother was diagnosed with breast cancer in her 56s. The patient has been genetically tested and was negative. She was tested in approximally 2015. The patient does not know her lifetime risk of breast cancer. Recommend determining whether the patient received panel testing. I suspect she received a panel test if she was indeed tested in 2015. If the patient did not receive a panel test, recommend sending the patient to genetic counseling for a new test. If the patient did receive a panel test, recommend assessing the patient's lifetime risk of breast cancer with the [place]. If the patient is genetically positive or has a lifetime risk of greater than 20%, recommend annual breast MRI. Also, if the patient is high risk, recommend assessing the patient with the [place] to  determine whether she would benefit from antiestrogen therapy. I have discussed the findings and recommendations with the patient. If applicable, a reminder letter will be sent to the patient regarding the next appointment. BI-RADS CATEGORY  4: Suspicious. Electronically Signed   By: Dorise Bullion III M.D.   On: 06/01/2021 16:32  [name] AXILLARY NODE CORE BIOPSY RIGHT  Addendum Date: 06/05/2021   ADDENDUM REPORT: 06/05/2021 13:25 ADDENDUM: 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 This was found to be concordant by Dr. Nolon Nations. Following biopsy, a heart shaped clip was deployed into the small satellite lesion along the superolateral aspect of the dominant mass. This lesion was not sampled individually. Pathology revealed LYMPH NODE TISSUE NEGATIVE FOR METASTATIC CARCINOMA of the RIGHT axilla, tribell clip. This was found to be concordant by Dr. Nolon Nations. Pathology results were discussed with the patient by telephone. The patient reported doing well after the biopsies with tenderness at the sites. Post biopsy instructions and care were reviewed and questions were answered. The patient was encouraged to call [facility] for any additional concerns. The patient was referred to [facility] on June 10, 2021. Breast MRI recommended for extent of disease. Pathology results reported by Stacie Acres RN on 06/05/2021. Electronically Signed   By: Nolon Nations M.D.   On: 06/05/2021 13:25   Result Date: 06/05/2021 CLINICAL DATA:  Patient presents for ultrasound-guided core biopsy of RIGHT breast mass and enlarged RIGHT axillary lymph node. EXAM: [name] AXILLARY NODE CORE BIOPSY ULTRASOUND RIGHT BREAST CORE NEEDLE BIOPSY COMPARISON:  Previous exam(s). PROCEDURE: I met with the patient and we discussed the procedure of ultrasound-guided biopsy,  including benefits and alternatives. We discussed the high likelihood of a successful procedure. We discussed the risks of the procedure, including infection, bleeding, tissue injury, clip migration, and inadequate sampling. Informed written consent was given. The usual time-out protocol was performed immediately prior to the procedure. Prior to the biopsy, ultrasound was performed for planning purposes. During this exam, a small oval mass with indistinct margins is identified in the 10 o'clock location of the RIGHT breast 5 centimeters from the nipple measuring 0.5 x 0.3 x 0.5 centimeters. Site 1: RIGHT breast 12 o'clock. Lesion quadrant:  12 o'clock RIGHT breast, coil clip Using sterile technique and 1% lidocaine and 1% lidocaine with epinephrine as local anesthetic, under direct ultrasound visualization, a 12 gauge spring-loaded device was used to perform biopsy of mass in the 12 o'clock location of the RIGHT breast using a MEDIAL approach. At the conclusion of the procedure coil shaped tissue marker clip was deployed into the biopsy cavity. Following biopsy, a heart shaped clip was deployed into the small satellite lesion along the superolateral aspect of the dominant mass. This lesion was not sampled individually. Site 2: RIGHT axilla.  Lesion quadrant: RIGHT axilla, Tribell clip Using sterile technique and 1% lidocaine as local anesthetic, under direct ultrasound visualization, a 14 gauge spring-loaded device was used to perform biopsy of enlarged RIGHT axillary lymph node using a LATERAL approach. At the conclusion of the procedure Tribell tissue marker clip was deployed into the biopsy cavity. Follow-up 2-view mammogram was performed and dictated separately. IMPRESSION: Ultrasound guided biopsy of mass in the 12 o'clock location of the RIGHT breast and enlarged RIGHT axillary lymph node. No apparent complications. Electronically Signed: By: Nolon Nations M.D. On: 06/04/2021 09:38  MM CLIP PLACEMENT  RIGHT  Result Date: 06/04/2021 CLINICAL DATA:  Status post ultrasound-guided core biopsy of RIGHT axillary mass in RIGHT breast mass. Clip was placed in a small satellite lesion. EXAM: 3D DIAGNOSTIC RIGHT MAMMOGRAM POST ULTRASOUND BIOPSY x2 COMPARISON:  Previous exam(s). FINDINGS: 3D Mammographic images were obtained following ultrasound guided biopsy of mass in the 12 o'clock location of the RIGHT breast and placement of a coil shaped clip. The biopsy marking clip is in expected position at the site of biopsy. A heart shaped clip was placed within a satellite nodule along the superolateral aspect of the index lesion. The heart shaped clip is in the expected location. Following ultrasound biopsy of RIGHT axillary lymph node, a tribell clip was placed and is identified in the expected location. The heart and coil shaped clips are 2.5 centimeters apart on the craniocaudal projection. A ribbon shaped clip was placed at the time of a 2020 biopsy showing benign fibrosis. IMPRESSION: Tissue marker clips are in the expected locations after biopsy. Final Assessment: Post Procedure Mammograms for Marker Placement Electronically Signed   By: Nolon Nations M.D.   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 This was found to be concordant by Dr. Nolon Nations. Following biopsy, a heart shaped clip was deployed into the small satellite lesion along the superolateral aspect of the dominant mass. This lesion was not sampled individually. Pathology revealed LYMPH NODE TISSUE NEGATIVE FOR METASTATIC CARCINOMA of the RIGHT axilla, tribell clip. This was found to be concordant by Dr. Nolon Nations. Pathology results were discussed with the patient by telephone. The patient reported doing well after the biopsies with  tenderness at the sites. Post biopsy instructions and care were reviewed and questions were answered. The patient was encouraged to call [facility] for any additional concerns. The patient was referred to [facility] on June 10, 2021. Breast MRI recommended for extent of disease. Pathology results reported by Stacie Acres RN on 06/05/2021. Electronically Signed   By: Nolon Nations M.D.   On: 06/05/2021 13:25   Result  Date: 06/05/2021 CLINICAL DATA:  Patient presents for ultrasound-guided core biopsy of RIGHT breast mass and enlarged RIGHT axillary lymph node. EXAM: [name] AXILLARY NODE CORE BIOPSY ULTRASOUND RIGHT BREAST CORE NEEDLE BIOPSY COMPARISON:  Previous exam(s). PROCEDURE: I met with the patient and we discussed the procedure of ultrasound-guided biopsy, including benefits and alternatives. We discussed the high likelihood of a successful procedure. We discussed the risks of the procedure, including infection, bleeding, tissue injury, clip migration, and inadequate sampling. Informed written consent was given. The usual time-out protocol was performed immediately prior to the procedure. Prior to the biopsy, ultrasound was performed for planning purposes. During this exam, a small oval mass with indistinct margins is identified in the 10 o'clock location of the RIGHT breast 5 centimeters from the nipple measuring 0.5 x 0.3 x 0.5 centimeters. Site 1: RIGHT breast 12 o'clock. Lesion quadrant: 12 o'clock RIGHT breast, coil clip Using sterile technique and 1% lidocaine and 1% lidocaine with epinephrine as local anesthetic, under direct ultrasound visualization, a 12 gauge spring-loaded device was used to perform biopsy of mass in the 12 o'clock location of the RIGHT breast using a MEDIAL approach. At the conclusion of the procedure coil shaped tissue marker clip was deployed into the biopsy cavity.  Following biopsy, a heart shaped clip was deployed into the small satellite lesion along the superolateral aspect of the dominant mass. This lesion was not sampled individually. Site 2: RIGHT axilla.  Lesion quadrant: RIGHT axilla, Tribell clip Using sterile technique and 1% lidocaine as local anesthetic, under direct ultrasound visualization, a 14 gauge spring-loaded device was used to perform biopsy of enlarged RIGHT axillary lymph node using a LATERAL approach. At the conclusion of the procedure Tribell tissue marker clip was deployed into the biopsy cavity. Follow-up 2-view mammogram was performed and dictated separately. IMPRESSION: Ultrasound guided biopsy of mass in the 12 o'clock location of the RIGHT breast and enlarged RIGHT axillary lymph node. No apparent complications. Electronically Signed: By: Nolon Nations M.D. On: 06/04/2021 09:38     IMPRESSION/PLAN: This is a very nice 45 year old woman with a new diagnosis of right breast cancer.  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  This may facilitate breast conserving surgery.  If she undergoes breast conserving surgery she will definitely benefit from adjuvant radiation therapy for local regional control.  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    It was a pleasure meeting the patient today. We discussed the risks, benefits, and side effects of radiotherapy. I anticipate recommending radiotherapy to the right breast or chest wall to reduce her risk of locoregional recurrence by 2/3.  We discussed that radiation would take approximately 4-6 weeks to complete and that I would give the patient a few weeks to heal following surgery before starting treatment planning.  We spoke about acute effects  including skin irritation and fatigue as well as much less common late effects including internal organ injury or irritation. We spoke about the latest technology that is used to minimize the risk of late effects for patients undergoing radiotherapy to the breast or chest wall.  We also discussed, potential complications related to breast reconstruction in the setting of radiation therapy. No guarantees of treatment were given. The patient is enthusiastic about proceeding with treatment. I look forward to participating  in the patient's care as warranted.  I will await her referral back to me for postoperative follow-up and eventual CT simulation/treatment planning.  On date of service, in total, I spent 45 minutes on this encounter. Patient was seen in person.   __________________________________________   Eppie Gibson, MD  This document serves as a record of services personally performed by Eppie Gibson, MD. It was created on her behalf by Roney Mans, a trained medical scribe. The creation of this record is based on the scribe's personal observations and the provider's statements to them. This document has been checked and approved by the attending provider.

## 2021-06-10 ENCOUNTER — Inpatient Hospital Stay: Payer: No Typology Code available for payment source | Attending: Oncology

## 2021-06-10 ENCOUNTER — Inpatient Hospital Stay (HOSPITAL_BASED_OUTPATIENT_CLINIC_OR_DEPARTMENT_OTHER): Payer: No Typology Code available for payment source | Admitting: Oncology

## 2021-06-10 ENCOUNTER — Encounter: Payer: Self-pay | Admitting: Physical Therapy

## 2021-06-10 ENCOUNTER — Encounter: Payer: Self-pay | Admitting: Genetic Counselor

## 2021-06-10 ENCOUNTER — Encounter: Payer: Self-pay | Admitting: *Deleted

## 2021-06-10 ENCOUNTER — Ambulatory Visit
Admission: RE | Admit: 2021-06-10 | Discharge: 2021-06-10 | Disposition: A | Discharge status: Home / Self Care | Payer: No Typology Code available for payment source | Source: Ambulatory Visit | Attending: Radiation Oncology | Admitting: Radiation Oncology

## 2021-06-10 ENCOUNTER — Other Ambulatory Visit: Payer: Self-pay | Admitting: Surgery

## 2021-06-10 ENCOUNTER — Encounter: Payer: Self-pay | Admitting: Radiation Oncology

## 2021-06-10 ENCOUNTER — Inpatient Hospital Stay: Payer: No Typology Code available for payment source

## 2021-06-10 ENCOUNTER — Other Ambulatory Visit: Payer: Self-pay

## 2021-06-10 ENCOUNTER — Ambulatory Visit (HOSPITAL_BASED_OUTPATIENT_CLINIC_OR_DEPARTMENT_OTHER): Payer: No Typology Code available for payment source | Admitting: Genetic Counselor

## 2021-06-10 ENCOUNTER — Ambulatory Visit: Payer: No Typology Code available for payment source | Attending: Surgery | Admitting: Physical Therapy

## 2021-06-10 ENCOUNTER — Encounter: Payer: Self-pay | Admitting: Oncology

## 2021-06-10 VITALS — BP 146/90 | HR 87 | Temp 98.1°F | Resp 18 | Ht 66.0 in | Wt 125.0 lb

## 2021-06-10 DIAGNOSIS — Z8481 Family history of carrier of genetic disease: Secondary | ICD-10-CM | POA: Insufficient documentation

## 2021-06-10 DIAGNOSIS — Z85828 Personal history of other malignant neoplasm of skin: Secondary | ICD-10-CM | POA: Insufficient documentation

## 2021-06-10 DIAGNOSIS — Z170 Estrogen receptor positive status [ER+]: Secondary | ICD-10-CM | POA: Insufficient documentation

## 2021-06-10 DIAGNOSIS — Z8041 Family history of malignant neoplasm of ovary: Secondary | ICD-10-CM | POA: Insufficient documentation

## 2021-06-10 DIAGNOSIS — R293 Abnormal posture: Secondary | ICD-10-CM | POA: Insufficient documentation

## 2021-06-10 DIAGNOSIS — Z803 Family history of malignant neoplasm of breast: Secondary | ICD-10-CM | POA: Diagnosis not present

## 2021-06-10 DIAGNOSIS — C50411 Malignant neoplasm of upper-outer quadrant of right female breast: Secondary | ICD-10-CM | POA: Insufficient documentation

## 2021-06-10 DIAGNOSIS — Z923 Personal history of irradiation: Secondary | ICD-10-CM | POA: Diagnosis not present

## 2021-06-10 DIAGNOSIS — I10 Essential (primary) hypertension: Secondary | ICD-10-CM | POA: Diagnosis not present

## 2021-06-10 DIAGNOSIS — Z9221 Personal history of antineoplastic chemotherapy: Secondary | ICD-10-CM | POA: Diagnosis not present

## 2021-06-10 DIAGNOSIS — Z23 Encounter for immunization: Secondary | ICD-10-CM

## 2021-06-10 LAB — CMP (CANCER CENTER ONLY)
ALT: 15 U/L (ref 0–44)
AST: 18 U/L (ref 15–41)
Albumin: 4.4 g/dL (ref 3.5–5.0)
Alkaline Phosphatase: 49 U/L (ref 38–126)
Anion gap: 10 (ref 5–15)
BUN: 9 mg/dL (ref 6–20)
CO2: 24 mmol/L (ref 22–32)
Calcium: 9.6 mg/dL (ref 8.9–10.3)
Chloride: 107 mmol/L (ref 98–111)
Creatinine: 0.79 mg/dL (ref 0.44–1.00)
GFR, Estimated: >60 mL/min (ref >60)
Glucose, Bld: 87 mg/dL (ref 70–99)
Potassium: 4.3 mmol/L (ref 3.5–5.1)
Sodium: 141 mmol/L (ref 135–145)
Total Bilirubin: 0.8 mg/dL (ref 0.3–1.2)
Total Protein: 7.3 g/dL (ref 6.5–8.1)

## 2021-06-10 LAB — CBC WITH DIFFERENTIAL (CANCER CENTER ONLY)
Abs Immature Granulocytes: 0.01 10*3/uL (ref 0.00–0.07)
Basophils Absolute: 0.1 10*3/uL (ref 0.0–0.1)
Basophils Relative: 1 %
Eosinophils Absolute: 0.1 10*3/uL (ref 0.0–0.5)
Eosinophils Relative: 2 %
HCT: 42.8 % (ref 36.0–46.0)
Hemoglobin: 14.4 g/dL (ref 12.0–15.0)
Immature Granulocytes: 0 %
Lymphocytes Relative: 43 %
Lymphs Abs: 2.5 10*3/uL (ref 0.7–4.0)
MCH: 31.4 pg (ref 26.0–34.0)
MCHC: 33.6 g/dL (ref 30.0–36.0)
MCV: 93.2 fL (ref 80.0–100.0)
Monocytes Absolute: 0.3 10*3/uL (ref 0.1–1.0)
Monocytes Relative: 5 %
Neutro Abs: 2.8 10*3/uL (ref 1.7–7.7)
Neutrophils Relative %: 49 %
Platelet Count: 287 10*3/uL (ref 150–400)
RBC: 4.59 MIL/uL (ref 3.87–5.11)
RDW: 11.6 % (ref 11.5–15.5)
WBC Count: 5.8 10*3/uL (ref 4.0–10.5)
nRBC: 0 % (ref 0.0–0.2)

## 2021-06-10 LAB — GENETIC SCREENING ORDER

## 2021-06-10 MED ORDER — PROCHLORPERAZINE MALEATE 10 MG PO TABS: 10.0000 mg | ORAL_TABLET | Freq: Four times a day (QID) | ORAL | 1 refills | Status: DC | PRN

## 2021-06-10 MED ORDER — INFLUENZA VAC SPLIT QUAD 0.5 ML IM SUSY: 0.5000 mL | PREFILLED_SYRINGE | Freq: Once | INTRAMUSCULAR | Status: DC

## 2021-06-10 MED ORDER — LIDOCAINE-PRILOCAINE 2.5-2.5 % EX CREA: TOPICAL_CREAM | CUTANEOUS | 3 refills | Status: DC

## 2021-06-10 MED ORDER — INFLUENZA VAC SPLIT QUAD 0.5 ML IM SUSY
PREFILLED_SYRINGE | INTRAMUSCULAR | Status: AC
  Filled 2021-06-10: qty 0.5

## 2021-06-10 MED ORDER — LORAZEPAM 0.5 MG PO TABS: 0.5000 mg | ORAL_TABLET | Freq: Every evening | ORAL | 0 refills | Status: DC | PRN

## 2021-06-10 MED ORDER — DEXAMETHASONE 4 MG PO TABS: 8.0000 mg | ORAL_TABLET | Freq: Every day | ORAL | 1 refills | Status: DC

## 2021-06-10 NOTE — Progress Notes (Signed)
[place] Work  Initial Assessment   Ebony Lamb is a 45 y.o. year old female accompanied by husband. Clinical Social Work was referred by [facility] for assessment of psychosocial needs.   SDOH (Social Determinants of Health) assessments performed: Yes SDOH Interventions    Flowsheet Row Most Recent Value  SDOH Interventions   Food Insecurity Interventions Intervention Not Indicated  Financial Strain Interventions Intervention Not Indicated  Housing Interventions Intervention Not Indicated  Transportation Interventions Intervention Not Indicated       Distress Screen completed: Yes ONCBCN DISTRESS SCREENING 06/10/2021  Screening Type Initial Screening  Practical problem type Work/school  Family Problem type Children  Emotional problem type Nervousness/Anxiety  Information Concerns Type Lack of info about diagnosis;Lack of info about treatment;Lack of info about complementary therapy choices;Lack of info about maintaining fitness  Physical Problem type Pain      Family/Social Information:  Housing Arrangement: patient lives with husband Ebony Lamb  and two children ages 28 and 29. Family members/support persons in your life? Family, Friends/Colleagues, and neighbors. 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  Income source: Employment Financial concerns:  Not currently, but pt states that she has no disability insurance through her work and has limited sick leave remaining. Type of concern:  Patient is concerned about future unpaid sick leave during treatment, as well as not being able to work enough hours to qualify for current health insurance through her job. Food access concerns: no Religious or spiritual practice: no Medication Concerns: no  Services Currently in place:  N/A  Coping/ Adjustment to diagnosis: Patient understands treatment plan and what happens next? yes Concerns about  diagnosis and/or treatment:  Uncertainty about treatment outcomes, lack of disability coverage or paid sick leave through work. Patient reported stressors: Work/ school and Children, how to talk to children about diagnosis. Patient enjoys being outside and hiking with her family. Current coping skills/ strengths: Ability for insight , Capable of independent living , Motivation for treatment/growth , and Supportive family/friends     SUMMARY: Current SDOH Barriers: none at this time.  Interventions: Discussed common feeling and emotions when being diagnosed with cancer, and the importance of support during treatment Informed patient of the support team roles and support services at [facility] Provided CSW contact information and encouraged patient to call with any questions or concerns Provided patient with information about talking to kids about cancer, and grant applications.   Follow Up Plan:  Intern will send information to pt in the mail re grant applications and family resources. Patient will talk to HR at work about health insurance coverage if work hours are limited during treatment. Patient verbalizes understanding of plan: Yes   Rosary Lively, BSW Intern Supervised by [facility] , LCSW

## 2021-06-10 NOTE — Progress Notes (Signed)
[place]  [phone] Fax:([phone]     ID: Ebony Lamb DOB: 05-27-1976  MR#: [id number]  IRC#:[id number]  Patient Care Team: Ebony Contras, MD as PCP - General (Family Medicine) Ebony Keens, MD as Consulting Physician (General Surgery) Ebony Lamb, Ebony Dad, MD as Consulting Physician (Oncology) Ebony Gibson, MD as Attending Physician (Radiation Oncology) Ebony Germany, RN as Oncology Nurse Navigator Ebony Kaufmann, RN as Oncology Nurse Navigator Ebony Nunnery, MD as Consulting Physician (Otolaryngology) Ebony Kava, MD as Referring Physician (Obstetrics and Gynecology) Ebony Cruel, MD OTHER MD:  CHIEF COMPLAINT: Weakly estrogen receptor positive breast cancer  CURRENT TREATMENT: Neoadjuvant chemotherapy   HISTORY OF CURRENT ILLNESS: Ebony Lamb (pronounced "REECE") has a history of previous right breast biopsy (8 o'clock) in 07/2019 that was negative.  More recently she presented with a palpable right breast mass at 12 o'clock. She underwent right diagnostic mammography with tomography and right breast ultrasonography at [facility] on 06/01/2021 showing: breast density category D; palpable 3 cm irregular mass in right breast at 12 o'clock; possible 5 mm satellite lesion located 2.5 cm from dominant mass; abnormal right axillary lymph node.  Accordingly on 06/04/2021 she proceeded to biopsy of the right breast area in question. The pathology from this procedure ([id number]) showed: invasive ductal carcinoma, grade 3. Prognostic indicators significant for: estrogen receptor, 15% positive and progesterone receptor, 15% positive, both with weak-moderate staining intensity. Proliferation marker Ki67 at 70%. HER2 equivocal by immunohistochemistry (2+), but negative by fluorescent in situ hybridization with a signals ratio 1.47 and number per cell 2.50.  Biopsy of the questionable lymph node was negative and concordant  Cancer  Staging Malignant neoplasm of upper-outer quadrant of right breast in female, estrogen receptor positive ([name]) Staging form: Breast, AJCC 8th Edition - Clinical stage from 06/10/2021: Stage IIA (cT2, cN0, cM0, G3, ER+, PR+, HER2-) - Signed by Ebony Cruel, MD on 06/10/2021 Stage prefix: Initial diagnosis Histologic grading system: 3 grade system Laterality: Right Staged by: Pathologist and managing physician Stage used in treatment planning: Yes National guidelines used in treatment planning: Yes Type of national guideline used in treatment planning: NCCN  The patient's subsequent history is as detailed below.   INTERVAL HISTORY: Ebony Lamb was evaluated in the multidisciplinary breast cancer clinic on 06/10/2021 accompanied by her husband Ebony Lamb. Her case was also presented at the multidisciplinary breast cancer conference on the same day. At that time a preliminary plan was proposed: MRI, possible neoadjuvant chemotherapy, adjuvant radiation if breast conserving surgery, genetics, repeat prognostic panel with final pathology   REVIEW OF SYSTEMS: On the provided questionnaire, Ebony Lamb reports an aching pain to her low and mid back, pelvis, and left foot, wearing glasses, hearing loss and tinnitus, palpable breast lump with pain, and skin rash. The patient denies unusual headaches, visual changes, nausea, vomiting, stiff neck, dizziness, or gait imbalance. There has been no cough, phlegm production, or pleurisy, no chest pain or pressure, and no change in bowel or bladder habits. The patient denies fever, bleeding, unexplained fatigue or unexplained weight loss. A detailed review of systems was otherwise entirely negative.   COVID 19 VACCINATION STATUS: Pfizer x3; infection 01/2021   PAST MEDICAL HISTORY: Past Medical History:  Diagnosis Date   Abnormal Pap smear 09/13/2000   Allergy 02/2020   Seasonal   Depression    history of depression- denies currently   H/O varicella    Hypertension  09/14/2011   post partum- meds x 1 mo  No pertinent past medical history    Preterm labor     PAST SURGICAL HISTORY: Past Surgical History:  Procedure Laterality Date   BREAST BIOPSY Right 07/27/2019   CESAREAN SECTION  11/14/2011   Procedure: CESAREAN SECTION;  Surgeon: Eli Hose, MD;  Location: [facility];  Service: Gynecology;  Laterality: N/A;  Primary cesarean section with delivery of baby boy at [phone]. Apgars 9/9.   CESAREAN SECTION N/A 03/22/2014   Procedure: Repeat CESAREAN SECTION;  Surgeon: Alwyn Pea, MD;  Location: [facility];  Service: Obstetrics;  Laterality: N/A;   COSMETIC SURGERY  11/22/2017   Abdominoplasty   FOOT SURGERY     FRACTURE SURGERY  08/1999   Repair of 5th metatarsal in left foot   HERNIA REPAIR  11/22/2017   TUBAL LIGATION  11/22/2017   Tubes were completely removed   WISDOM TOOTH EXTRACTION      FAMILY HISTORY: Family History  Problem Relation Age of Onset   Breast cancer Mother 24   Cancer Mother    Osteopenia Mother    Arthritis Mother    Cancer Father    Hypertension Father    Kidney disease Father    Depression Father    Hyperlipidemia Father    Hypertension Brother    Hyperlipidemia Brother    Breast cancer Maternal Grandmother    Cancer Maternal Grandmother    Heart disease Maternal Grandfather    Cancer Paternal Grandmother    Ovarian cancer Paternal Grandmother    Stroke Paternal Grandfather    Birth defects Son    Anesthesia problems Neg Hx    Diabetes Neg Hx    Her parents are both living, her father age 12 and her mother age 53 as of 05/2021. Ebony Lamb has one brother (and no sisters). She reports breast cancer in her mother at age 29 and her maternal grandmother in her late 27's, melanoma in her father at age 59, and ovarian cancer in her paternal grandmother in her 83's.   GYNECOLOGIC HISTORY:  No LMP recorded. Menarche: 45 years old Age at first live birth: 45 years old Ebony Lamb P 2 LMP 06/06/2021 Contraceptive: used from  1996-2003 and 2006-2012 HRT n/a  Hysterectomy? no BSO? 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 Husband Ebony Lamb work for [employer] in [employer]. She lives at home with Ebony Lamb and their two children-- Tiffany Kocher, age 48, and Cori Razor, age 62. 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    ADVANCED DIRECTIVES: in place   HEALTH MAINTENANCE: Social History   Tobacco Use   Smoking status: Never   Smokeless tobacco: Never  Vaping Use   Vaping Use: Never used  Substance Use Topics   Alcohol use: Yes    Alcohol/week: 7.0 standard drinks    Types: 7 Standard drinks or equivalent per week    Comment: I stopped drinking alcohol after finding the lump in my brea   Drug use: No     Colonoscopy: never done (age)  PAP: 06/2020  Bone density: never done (age)   No Known Allergies  Current Outpatient Medications  Medication Sig Dispense Refill   fluticasone (FLONASE) 50 MCG/ACT nasal spray Place 1-2 sprays into both nostrils daily for 7 days. 1 g 0   ibuprofen (ADVIL,MOTRIN) 600 MG tablet Take 1 tablet (600 mg total) by mouth every 6 (six) hours as needed for fever, headache, mild pain, moderate pain or cramping. [place]  tablet 2   oxyCODONE-acetaminophen (PERCOCET/ROXICET) 5-325 MG per tablet Take 1-2 tablets by mouth every 4 (four) hours as needed for moderate pain or severe pain (moderate - severe pain). 40 tablet 0   Prenatal Vit-Fe Fumarate-FA (PRENATAL MULTIVITAMIN) TABS Take 1 tablet by mouth daily. 30 tablet 11   No current facility-administered medications for this visit.    OBJECTIVE: White woman who appears stated age  69:   06/10/21 0858  BP: (!) 146/90  Pulse: 87  Resp: 18  Temp: 98.1 F (36.7 C)  SpO2: 100%     Body mass index is 20.18 kg/m.   Wt Readings from Last 3 Encounters:  06/10/21 125 lb (56.7 kg)  03/22/14 160 lb (72.6 kg)  03/21/14 160 lb (72.6 kg)      ECOG FS:1 - Symptomatic but  completely ambulatory  Ocular: Sclerae unicteric, pupils round and equal Ear-nose-throat: Wearing a mask Lymphatic: No cervical or supraclavicular adenopathy Lungs no rales or rhonchi Heart regular rate and rhythm Abd soft, nontender, positive bowel sounds MSK no focal spinal tenderness, no joint edema Neuro: non-focal, well-oriented, appropriate affect Breasts: The right breast is status post recent biopsy.  There is a palpable and movable mass.  There are no skin or nipple changes of concern.  The left breast and both axillae are benign   LAB RESULTS:  CMP     Component Value Date/Time   NA 141 06/10/2021 0838   K 4.3 06/10/2021 0838   CL 107 06/10/2021 0838   CO2 24 06/10/2021 0838   GLUCOSE 87 06/10/2021 0838   BUN 9 06/10/2021 0838   CREATININE 0.79 06/10/2021 0838   CALCIUM 9.6 06/10/2021 0838   PROT 7.3 06/10/2021 0838   ALBUMIN 4.4 06/10/2021 0838   AST 18 06/10/2021 0838   ALT 15 06/10/2021 0838   ALKPHOS 49 06/10/2021 0838   BILITOT 0.8 06/10/2021 0838   GFRNONAA >60 06/10/2021 0838   GFRAA >90 11/26/2011 2008    No results found for: Ronnald Ramp, A1GS, A2GS, BETS, BETA2SER, GAMS, MSPIKE, SPEI  Lab Results  Component Value Date   WBC 5.8 06/10/2021   NEUTROABS 2.8 06/10/2021   HGB 14.4 06/10/2021   HCT 42.8 06/10/2021   MCV 93.2 06/10/2021   PLT 287 06/10/2021    No results found for: LABCA2  No components found for: IOMBTD974  No results for input(s): INR in the last 168 hours.  No results found for: LABCA2  No results found for: BUL845  No results found for: XMI680  No results found for: HOZ224  No results found for: CA2729  No components found for: HGQUANT  No results found for: CEA1 / No results found for: CEA1   No results found for: AFPTUMOR  No results found for: CHROMOGRNA  No results found for: KPAFRELGTCHN, LAMBDASER, KAPLAMBRATIO (kappa/lambda light chains)  No results found for: HGBA, HGBA2QUANT, HGBFQUANT,  HGBSQUAN (Hemoglobinopathy evaluation)   Lab Results  Component Value Date   LDH 223 11/26/2011    No results found for: IRON, TIBC, IRONPCTSAT (Iron and TIBC)  No results found for: FERRITIN  Urinalysis    Component Value Date/Time   COLORURINE YELLOW 11/26/2011 2000   APPEARANCEUR CLEAR 11/26/2011 2000   LABSPEC <1.005 (L) 11/26/2011 2000   PHURINE 6.5 11/26/2011 2000   GLUCOSEU NEGATIVE 11/26/2011 2000   HGBUR SMALL (A) 11/26/2011 2000   BILIRUBINUR NEGATIVE 11/26/2011 2000   KETONESUR NEGATIVE 11/26/2011 2000   PROTEINUR NEGATIVE 11/26/2011 2000   UROBILINOGEN 0.2 11/26/2011 2000  NITRITE NEGATIVE 11/26/2011 2000   LEUKOCYTESUR MODERATE (A) 11/26/2011 2000     STUDIES: US BREAST LTD UNI RIGHT INC AXILLA  Result Date: 06/01/2021 CLINICAL DATA:  Palpable lump in the right breast. EXAM: DIGITAL DIAGNOSTIC UNILATERAL RIGHT MAMMOGRAM WITH TOMOSYNTHESIS AND CAD; ULTRASOUND RIGHT BREAST LIMITED TECHNIQUE: Right digital diagnostic mammography and breast tomosynthesis was performed. The images were evaluated with computer-aided detection.; Targeted ultrasound examination of the right breast was performed COMPARISON:  Previous exam(s). ACR Breast Density Category d: The breast tissue is extremely dense, which lowers the sensitivity of mammography. FINDINGS: There is a mass in the medial superior right breast at the site of the patient's palpable lump. No other suspicious mammographic findings on the right On physical exam, there is a lump in the right breast at 12 o'clock. Targeted ultrasound is performed, showing an irregular mass in the right breast at 12 o'clock, 2 cm from the nipple measuring 3.0 x 1.6 x 2.7 cm. There is a small adjacent mass measuring 5 x 2 x 5 mm 3.5 mm from the dominant mass. There is a single abnormal lymph node in the right axilla with a cortex measuring nearly 5 mm. IMPRESSION: 1. Suspicious mass in the right breast.  Possible satellite lesion. 2. Abnormal  lymph node in the right axilla. RECOMMENDATION: Recommend ultrasound-guided biopsy of the dominant 12 o'clock right breast mass and the adjacent satellite lesion. These masses can be biopsied with 1 biopsy. Recommend placing a biopsy clip within the right breast mass and the adjacent satellite lesion. Recommend ultrasound-guided biopsy of the abnormal right axillary lymph node. If the biopsy demonstrates malignancy, strongly recommend breast MRI due to breast density and patient's family history. The patient's mother was diagnosed with breast cancer in her 29s. Her mother was diagnosed with the BRCA2 gene. However, it is thought the BRCA2 gene was inherited from her mother's father. The patient's maternal grandmother was diagnosed with breast cancer in her 54s. The patient has been genetically tested and was negative. She was tested in approximally 2015. The patient does not know her lifetime risk of breast cancer. Recommend determining whether the patient received panel testing. I suspect she received a panel test if she was indeed tested in 2015. If the patient did not receive a panel test, recommend sending the patient to genetic counseling for a new test. If the patient did receive a panel test, recommend assessing the patient's lifetime risk of breast cancer with the [place]. If the patient is genetically positive or has a lifetime risk of greater than 20%, recommend annual breast MRI. Also, if the patient is high risk, recommend assessing the patient with the [place] to determine whether she would benefit from antiestrogen therapy. I have discussed the findings and recommendations with the patient. If applicable, a reminder letter will be sent to the patient regarding the next appointment. BI-RADS CATEGORY  4: Suspicious. Electronically Signed   By: Dorise Bullion III M.D.   On: 06/01/2021 16:32  MM DIAG BREAST TOMO UNI LEFT  Result Date: 06/08/2021 CLINICAL DATA:  The patient reports that  she had a red, flaky area on the skin in the medial periareolar left breast beginning in April 2022 that subsequently resolved 1 month ago. She currently has no left breast problems. Recently diagnosed malignancy in the right breast. EXAM: DIGITAL DIAGNOSTIC UNILATERAL LEFT MAMMOGRAM WITH TOMOSYNTHESIS AND CAD TECHNIQUE: Left digital diagnostic mammography and breast tomosynthesis was performed. The images were evaluated with computer-aided detection. COMPARISON:  Previous  exam(s). ACR Breast Density Category d: The breast tissue is extremely dense, which lowers the sensitivity of mammography. FINDINGS: Stable mammographic appearance of the left breast with no findings suspicious for malignancy. On physical examination, the medial periareolar left breast skin has a normal appearance at the location of the previous redness and flaking with no palpable abnormality. IMPRESSION: No evidence of malignancy. RECOMMENDATION: Treatment plan for the patient's recently diagnosed right breast cancer. I have discussed the findings and recommendations with the patient. If applicable, a reminder letter will be sent to the patient regarding the next appointment. BI-RADS CATEGORY  1: Negative. Electronically Signed   By: Claudie Revering M.D.   On: 06/08/2021 09:08  MM DIAG BREAST TOMO UNI RIGHT  Result Date: 06/01/2021 CLINICAL DATA:  Palpable lump in the right breast. EXAM: DIGITAL DIAGNOSTIC UNILATERAL RIGHT MAMMOGRAM WITH TOMOSYNTHESIS AND CAD; ULTRASOUND RIGHT BREAST LIMITED TECHNIQUE: Right digital diagnostic mammography and breast tomosynthesis was performed. The images were evaluated with computer-aided detection.; Targeted ultrasound examination of the right breast was performed COMPARISON:  Previous exam(s). ACR Breast Density Category d: The breast tissue is extremely dense, which lowers the sensitivity of mammography. FINDINGS: There is a mass in the medial superior right breast at the site of the patient's palpable  lump. No other suspicious mammographic findings on the right On physical exam, there is a lump in the right breast at 12 o'clock. Targeted ultrasound is performed, showing an irregular mass in the right breast at 12 o'clock, 2 cm from the nipple measuring 3.0 x 1.6 x 2.7 cm. There is a small adjacent mass measuring 5 x 2 x 5 mm 3.5 mm from the dominant mass. There is a single abnormal lymph node in the right axilla with a cortex measuring nearly 5 mm. IMPRESSION: 1. Suspicious mass in the right breast.  Possible satellite lesion. 2. Abnormal lymph node in the right axilla. RECOMMENDATION: Recommend ultrasound-guided biopsy of the dominant 12 o'clock right breast mass and the adjacent satellite lesion. These masses can be biopsied with 1 biopsy. Recommend placing a biopsy clip within the right breast mass and the adjacent satellite lesion. Recommend ultrasound-guided biopsy of the abnormal right axillary lymph node. If the biopsy demonstrates malignancy, strongly recommend breast MRI due to breast density and patient's family history. The patient's mother was diagnosed with breast cancer in her 30s. Her mother was diagnosed with the BRCA2 gene. However, it is thought the BRCA2 gene was inherited from her mother's father. The patient's maternal grandmother was diagnosed with breast cancer in her 21s. The patient has been genetically tested and was negative. She was tested in approximally 2015. The patient does not know her lifetime risk of breast cancer. Recommend determining whether the patient received panel testing. I suspect she received a panel test if she was indeed tested in 2015. If the patient did not receive a panel test, recommend sending the patient to genetic counseling for a new test. If the patient did receive a panel test, recommend assessing the patient's lifetime risk of breast cancer with the [place]. If the patient is genetically positive or has a lifetime risk of greater than 20%,  recommend annual breast MRI. Also, if the patient is high risk, recommend assessing the patient with the [place] to determine whether she would benefit from antiestrogen therapy. I have discussed the findings and recommendations with the patient. If applicable, a reminder letter will be sent to the patient regarding the next appointment. BI-RADS CATEGORY  4: Suspicious.  Electronically Signed   By: Dorise Bullion III M.D.   On: 06/01/2021 16:32  [name] AXILLARY NODE CORE BIOPSY RIGHT  Addendum Date: 06/05/2021   ADDENDUM REPORT: 06/05/2021 13:25 ADDENDUM: 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 This was found to be concordant by Dr. Nolon Nations. Following biopsy, a heart shaped clip was deployed into the small satellite lesion along the superolateral aspect of the dominant mass. This lesion was not sampled individually. Pathology revealed LYMPH NODE TISSUE NEGATIVE FOR METASTATIC CARCINOMA of the RIGHT axilla, tribell clip. This was found to be concordant by Dr. Nolon Nations. Pathology results were discussed with the patient by telephone. The patient reported doing well after the biopsies with tenderness at the sites. Post biopsy instructions and care were reviewed and questions were answered. The patient was encouraged to call [facility] for any additional concerns. The patient was referred to [facility] at [facility] on June 10, 2021. Breast MRI recommended for extent of disease. Pathology results reported by Stacie Acres RN on 06/05/2021. Electronically Signed   By: Nolon Nations M.D.   On: 06/05/2021 13:25   Result Date: 06/05/2021 CLINICAL DATA:  Patient presents for ultrasound-guided core biopsy of RIGHT breast mass and enlarged RIGHT axillary lymph node. EXAM: [name] AXILLARY NODE CORE BIOPSY ULTRASOUND RIGHT BREAST CORE NEEDLE BIOPSY COMPARISON:   Previous exam(s). PROCEDURE: I met with the patient and we discussed the procedure of ultrasound-guided biopsy, including benefits and alternatives. We discussed the high likelihood of a successful procedure. We discussed the risks of the procedure, including infection, bleeding, tissue injury, clip migration, and inadequate sampling. Informed written consent was given. The usual time-out protocol was performed immediately prior to the procedure. Prior to the biopsy, ultrasound was performed for planning purposes. During this exam, a small oval mass with indistinct margins is identified in the 10 o'clock location of the RIGHT breast 5 centimeters from the nipple measuring 0.5 x 0.3 x 0.5 centimeters. Site 1: RIGHT breast 12 o'clock. Lesion quadrant: 12 o'clock RIGHT breast, coil clip Using sterile technique and 1% lidocaine and 1% lidocaine with epinephrine as local anesthetic, under direct ultrasound visualization, a 12 gauge spring-loaded device was used to perform biopsy of mass in the 12 o'clock location of the RIGHT breast using a MEDIAL approach. At the conclusion of the procedure coil shaped tissue marker clip was deployed into the biopsy cavity. Following biopsy, a heart shaped clip was deployed into the small satellite lesion along the superolateral aspect of the dominant mass. This lesion was not sampled individually. Site 2: RIGHT axilla.  Lesion quadrant: RIGHT axilla, Tribell clip Using sterile technique and 1% lidocaine as local anesthetic, under direct ultrasound visualization, a 14 gauge spring-loaded device was used to perform biopsy of enlarged RIGHT axillary lymph node using a LATERAL approach. At the conclusion of the procedure Tribell tissue marker clip was deployed into the biopsy cavity. Follow-up 2-view mammogram was performed and dictated separately. IMPRESSION: Ultrasound guided biopsy of mass in the 12 o'clock location of the RIGHT breast and enlarged RIGHT axillary lymph node. No apparent  complications. Electronically Signed: By: Nolon Nations M.D. On: 06/04/2021 09:38  MM CLIP PLACEMENT RIGHT  Result Date: 06/04/2021 CLINICAL DATA:  Status post ultrasound-guided core biopsy of RIGHT axillary mass in RIGHT breast mass. Clip was placed in a small satellite lesion. EXAM: 3D DIAGNOSTIC RIGHT MAMMOGRAM POST ULTRASOUND BIOPSY x2  COMPARISON:  Previous exam(s). FINDINGS: 3D Mammographic images were obtained following ultrasound guided biopsy of mass in the 12 o'clock location of the RIGHT breast and placement of a coil shaped clip. The biopsy marking clip is in expected position at the site of biopsy. A heart shaped clip was placed within a satellite nodule along the superolateral aspect of the index lesion. The heart shaped clip is in the expected location. Following ultrasound biopsy of RIGHT axillary lymph node, a tribell clip was placed and is identified in the expected location. The heart and coil shaped clips are 2.5 centimeters apart on the craniocaudal projection. A ribbon shaped clip was placed at the time of a 2020 biopsy showing benign fibrosis. IMPRESSION: Tissue marker clips are in the expected locations after biopsy. Final Assessment: Post Procedure Mammograms for Marker Placement Electronically Signed   By: Nolon Nations M.D.   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 This was found to be concordant by Dr. Nolon Nations. Following biopsy, a heart shaped clip was deployed into the small satellite lesion along the superolateral aspect of the dominant mass. This lesion was not sampled individually. Pathology revealed LYMPH NODE TISSUE NEGATIVE FOR METASTATIC CARCINOMA of the RIGHT axilla, tribell clip. This was found to be concordant by Dr. Nolon Nations. Pathology  results were discussed with the patient by telephone. The patient reported doing well after the biopsies with tenderness at the sites. Post biopsy instructions and care were reviewed and questions were answered. The patient was encouraged to call [facility] for any additional concerns. The patient was referred to [facility] at [facility] on June 10, 2021. Breast MRI recommended for extent of disease. Pathology results reported by Stacie Acres RN on 06/05/2021. Electronically Signed   By: Nolon Nations M.D.   On: 06/05/2021 13:25   Result Date: 06/05/2021 CLINICAL DATA:  Patient presents for ultrasound-guided core biopsy of RIGHT breast mass and enlarged RIGHT axillary lymph node. EXAM: [name] AXILLARY NODE CORE BIOPSY ULTRASOUND RIGHT BREAST CORE NEEDLE BIOPSY COMPARISON:  Previous exam(s). PROCEDURE: I met with the patient and we discussed the procedure of ultrasound-guided biopsy, including benefits and alternatives. We discussed the high likelihood of a successful procedure. We discussed the risks of the procedure, including infection, bleeding, tissue injury, clip migration, and inadequate sampling. Informed written consent was given. The usual time-out protocol was performed immediately prior to the procedure. Prior to the biopsy, ultrasound was performed for planning purposes. During this exam, a small oval mass with indistinct margins is identified in the 10 o'clock location of the RIGHT breast 5 centimeters from the nipple measuring 0.5 x 0.3 x 0.5 centimeters. Site 1: RIGHT breast 12 o'clock. Lesion quadrant: 12 o'clock RIGHT breast, coil clip Using sterile technique and 1% lidocaine and 1% lidocaine with epinephrine as local anesthetic, under direct ultrasound visualization, a 12 gauge spring-loaded device was used to perform biopsy of mass in the 12 o'clock location of the RIGHT breast using a MEDIAL approach.  At the conclusion of the procedure coil shaped tissue marker clip was deployed into the biopsy cavity. Following biopsy, a heart shaped clip was deployed into the small satellite lesion along the superolateral aspect of the dominant mass. This lesion  was not sampled individually. Site 2: RIGHT axilla.  Lesion quadrant: RIGHT axilla, Tribell clip Using sterile technique and 1% lidocaine as local anesthetic, under direct ultrasound visualization, a 14 gauge spring-loaded device was used to perform biopsy of enlarged RIGHT axillary lymph node using a LATERAL approach. At the conclusion of the procedure Tribell tissue marker clip was deployed into the biopsy cavity. Follow-up 2-view mammogram was performed and dictated separately. IMPRESSION: Ultrasound guided biopsy of mass in the 12 o'clock location of the RIGHT breast and enlarged RIGHT axillary lymph node. No apparent complications. Electronically Signed: By: Nolon Nations M.D. On: 06/04/2021 09:38    ELIGIBLE FOR AVAILABLE RESEARCH PROTOCOL: no  ASSESSMENT: 45 y.o. [place] woman status post right breast upper outer quadrant biopsy 06/04/2021 for a clinical mT2 N0 invasive ductal carcinoma, grade 3, estrogen and progesterone receptor weakly positive, HER2 not amplified, with an MIB-1 of 70%  (1) genetics testing  (2) neoadjuvant chemotherapy will consist of doxorubicin and cyclophosphamide in dose dense fashion x4 beginning 06/25/2021, followed by weekly paclitaxel x12  (3) definitive surgery to follow  (4) adjuvant radiation as appropriate  (5) antiestrogens  PLAN: I met today with Kalinda to review her new diagnosis. Specifically we discussed the biology of her breast cancer, its diagnosis, staging, treatment  options and prognosis.We first reviewed the fact that cancer is not one disease but more than 100 different diseases and that it is important to keep them separate-- otherwise when friends and relatives discuss their own cancer  experiences with Reinette confusion can result. Similarly we explained that if breast cancer spreads to the bone or liver, the patient would not have bone cancer or liver cancer, but breast cancer in the bone and breast cancer in the liver: one cancer in three places-- not 3 different cancers which otherwise would have to be treated in 3 different ways.  We discussed the difference between local and systemic therapy. In terms of loco-regional treatment, lumpectomy plus radiation is equivalent to mastectomy as far as survival is concerned. For this reason, and because the cosmetic results are generally superior, we recommend breast conserving surgery.   We also noted that in terms of sequencing of treatments, whether systemic therapy or surgery is done first does not affect the ultimate outcome.  This is relevant to Aaron's situation since we believe starting with chemotherapy will give her ample time to plan the definitive surgery and the type of reconstruction that she may choose.  We then discussed the rationale for systemic therapy. There is some risk that this cancer may have already spread to other parts of her body.  Scans cannot answer the question the patient really would like to know, which is whether she has microscopic disease elsewhere in her body.  In her case, however, given that we are dealing with prognostic to be disease and that she has some back pain, we will obtain a CT of the chest and a bone scan.    Next we went over the options for systemic therapy which are anti-estrogens, anti-HER-2 immunotherapy, and chemotherapy. Kenyotta does not meet criteria for anti-HER-2 immunotherapy. She is a candidate for anti-estrogens--the fact that the progesterone receptor is positive indicates some measure of estrogen receptor activity.  Nevertheless this is "weak" and I certainly would not count on antiestrogens alone for systemic therapy..  Accordingly she will need chemotherapy.  We are planning to do  this neoadjuvantly.  This will consist of standard cyclophosphamide and doxorubicin in dose dense fashion x4 followed  by weekly paclitaxel.  We are hoping to be able to start 06/25/2021.Junie Panning has a good understanding of the overall plan. She agrees with it. She knows the goal of treatment in her case is cure. She will call with any problems that may develop before her next visit here.  Total encounter time 65 minutes.Sarajane Jews C. Matyas Baisley, MD 06/10/2021 11:39 AM Medical Oncology and Hematology [facility] [place], [place] [address] Tel. [phone]    Fax. [phone]   This document serves as a record of services personally performed by Lurline Del, MD. It was created on his behalf by Wilburn Mylar, a trained medical scribe. The creation of this record is based on the scribe's personal observations and the provider's statements to them.   I, Lurline Del MD, have reviewed the above documentation for accuracy and completeness, and I agree with the above.    *Total Encounter Time as defined by the Centers for Medicare and Medicaid Services includes, in addition to the face-to-face time of a patient visit (documented in the note above) non-face-to-face time: obtaining and reviewing outside history, ordering and reviewing medications, tests or procedures, care coordination (communications with other health care professionals or caregivers) and documentation in the medical record.

## 2021-06-10 NOTE — Progress Notes (Signed)
START ON PATHWAY REGIMEN - Breast     Cycles 1 through 4: A cycle is every 14 days:     Doxorubicin      Cyclophosphamide      Pegfilgrastim-xxxx    Cycles 5 through 16: A cycle is every 7 days:     Paclitaxel   **Always confirm dose/schedule in your pharmacy ordering system**  Patient Characteristics: Preoperative or Nonsurgical Candidate (Clinical Staging), Neoadjuvant Therapy followed by Surgery, Invasive Disease, Chemotherapy, HER2 Negative/Unknown/Equivocal, ER Positive Therapeutic Status: Preoperative or Nonsurgical Candidate (Clinical Staging) AJCC M Category: cM0 AJCC Grade: G3 Breast Surgical Plan: Neoadjuvant Therapy followed by Surgery ER Status: Positive (+) AJCC 8 Stage Grouping: IIA HER2 Status: Negative (-) AJCC T Category: cT2 AJCC N Category: cN0 PR Status: Positive (+) Intent of Therapy: Curative Intent, Discussed with Patient

## 2021-06-10 NOTE — Progress Notes (Signed)
REFERRING PROVIDER: Lurline Del, MD [place], [place] [address]  PRIMARY PROVIDER:  Antony Contras, MD  PRIMARY REASON FOR VISIT:    HISTORY OF PRESENT ILLNESS:   Ebony Lamb, a 45 y.o. female, was seen for a [place] cancer genetics consultation during the breast multidisciplinary clinic at the request of Dr. Jana Hakim due to a personal and family history of cancer.  Ebony Lamb presents to clinic today to discuss the possibility of a hereditary predisposition to cancer, to discuss genetic testing, and to further clarify her future cancer risks, as well as potential cancer risks for family members.   In September 2022, at the age of 76, Ebony Lamb was diagnosed with invasive ductal carcinoma of the right breast. The treatment plan is pending.   RISK FACTORS:  Menarche was at age 89.  First live birth at age 70.  OCP use for approximately  13  years.  Ovaries intact: yes.  Uterus intact: yes.  Menopausal status: premenopausal.  HRT use: 0 years. Colonoscopy: no Mammogram within the last year: yes Up to date with pelvic exams: yes. Any excessive radiation exposure in the past: no  Past Medical History:  Diagnosis Date   Abnormal Pap smear 2002   Depression    history of depression- denies currently   H/O varicella    Hypertension 2013   post partum- meds x 1 mo   No pertinent past medical history    Preterm labor     Past Surgical History:  Procedure Laterality Date   BREAST BIOPSY Right 07/27/2019   CESAREAN SECTION  11/14/2011   Procedure: CESAREAN SECTION;  Surgeon: Eli Hose, MD;  Location: [facility];  Service: Gynecology;  Laterality: N/A;  Primary cesarean section with delivery of baby boy at [phone]. Apgars 9/9.   CESAREAN SECTION N/A 03/22/2014   Procedure: Repeat CESAREAN SECTION;  Surgeon: Alwyn Pea, MD;  Location: [facility];  Service: Obstetrics;  Laterality: N/A;   FOOT SURGERY     WISDOM TOOTH EXTRACTION      Social History    Socioeconomic History   Marital status: Married    Spouse name: Not on file   Number of children: Not on file   Years of education: Not on file   Highest education level: Not on file  Occupational History   Not on file  Tobacco Use   Smoking status: Never   Smokeless tobacco: Never  Vaping Use   Vaping Use: Never used  Substance and Sexual Activity   Alcohol use: Yes    Comment: occasionally   Drug use: No   Sexual activity: Yes    Birth control/protection: Condom, Coitus interruptus  Other Topics Concern   Not on file  Social History Narrative   Not on file   Social Determinants of Health   Financial Resource Strain: Not on file  Food Insecurity: Not on file  Transportation Needs: Not on file  Physical Activity: Not on file  Stress: Not on file  Social Connections: Not on file     FAMILY HISTORY:  We obtained a detailed, 4-generation family history.  Significant diagnoses are listed below:  Family History  Problem Relation Age of Onset   Breast cancer Mother 27   Other Mother        BRCA2 gene mutation   Melanoma Father 91   Breast cancer Maternal Grandmother        dx. late 30s/40s   Other Maternal Grandfather  BRCA2 gene mutation   Ovarian cancer Paternal Grandmother        dx. 46s       Ebony Lamb mother was diagnosed with breast cancer at age 33 and was found to have a BRCA2 mutation. Her maternal grandfather is an obligate carrier because several of her mother's paternal cousins tested positive for the familial BRCA2 mutation. Ebony Lamb reports that she had negative genetic testing through [facility] for her mother's BRCA2 gene mutation. Of note, we do not have her genetic test results nor do we have her mother's genetic test results. Ebony Lamb maternal grandmother was diagnosed with breast cancer in her late 27s/40s, she died due to cancer metastasis. Her father was diagnosed with melanoma at 25. Her paternal grandmother was diagnosed with ovarian  cancer in her 83s, she died in her early 70s due to cancer metastasis. There no reported Ashkenazi Jewish ancestry.   GENETIC COUNSELING ASSESSMENT: Ebony Lamb is a 45 y.o. female with a personal and family history of cancer which is somewhat suggestive of a hereditary cancer syndrome and predisposition to cancer given her young age at diagnosis and family history of breast and ovarian cancer. We, therefore, discussed and recommended the following at today's visit.   DISCUSSION: We discussed that 5 - 10% of cancer is hereditary, with most cases of hereditary breast cancer associated with mutations in BRCA1/2.  There are other genes that can be associated with hereditary breast and ovarian cancer syndromes. Type of cancer risk and level of risk are gene-specific. We discussed that testing is beneficial for several reasons including knowing how to follow individuals after completing their treatment, identifying whether potential treatment options would be beneficial, and understanding if other family members could be at risk for cancer and allowing them to undergo genetic testing.   We reviewed the characteristics, features and inheritance patterns of hereditary cancer syndromes. We also discussed genetic testing, including the appropriate family members to test, the process of testing, insurance coverage and turn-around-time for results. We discussed the implications of a negative, positive and/or variant of uncertain significant result. In order to get genetic test results in a timely manner so that Ebony Lamb can use these genetic test results for surgical decisions, we recommended Ebony Lamb pursue genetic testing for the [facility] panel. Once complete, we recommend Ebony Lamb pursue reflex genetic testing to a more comprehensive gene panel.   Ebony Lamb  was offered a common hereditary cancer panel (47 genes) and an expanded pan-cancer panel (77 genes). Ebony Lamb was informed of the benefits and limitations of each  panel, including that expanded pan-cancer panels contain genes that do not have clear management guidelines at this point in time.  We also discussed that as the number of genes included on a panel increases, the chances of variants of uncertain significance increases.  After considering the benefits and limitations of each gene panel, Ebony Lamb elected to have [facility] (77 genes)+RNAinsight.   The CancerNext-Expanded gene panel offered by [facility] and includes sequencing, rearrangement, and RNA analysis for the following 77 genes: AIP, ALK, APC, ATM, AXIN2, BAP1, BARD1, BLM, BMPR1A, BRCA1, BRCA2, BRIP1, CDC73, CDH1, CDK4, CDKN1B, CDKN2A, CHEK2, CTNNA1, DICER1, FANCC, FH, FLCN, GALNT12, KIF1B, LZTR1, MAX, MEN1, MET, MLH1, MSH2, MSH3, MSH6, MUTYH, NBN, NF1, NF2, NTHL1, PALB2, PHOX2B, PMS2, POT1, PRKAR1A, PTCH1, PTEN, RAD51C, RAD51D, RB1, RECQL, RET, SDHA, SDHAF2, SDHB, SDHC, SDHD, SMAD4, SMARCA4, SMARCB1, SMARCE1, STK11, SUFU, TMEM127, TP53, TSC1, TSC2, VHL and XRCC2 (sequencing and deletion/duplication); EGFR,  EGLN1, HOXB13, KIT, MITF, PDGFRA, POLD1, and POLE (sequencing only); EPCAM and GREM1 (deletion/duplication only).    Based on Ms. Raineri personal and family history of cancer, she meets medical criteria for genetic testing. Despite that she meets criteria, she may still have an out of pocket cost. We discussed that if her out of pocket cost for testing is over $100, the laboratory should contact them to discuss self-pay prices, patient pay assistance programs, if applicable, and other billing options.   PLAN: After considering the risks, benefits, and limitations, Ms. Rossi provided informed consent to pursue genetic testing and the blood sample was sent to [facility] for analysis of the CancerNext-Expanded+RNA Panel (77 genes). Results should be available within approximately 1-2 weeks' time, at which point they will be disclosed by telephone to Ms. Choung, as will any  additional recommendations warranted by these results. Ms. Stanislawski will receive a summary of her genetic counseling visit and a copy of her results once available. This information will also be available in Epic.   Ms. Maclaren questions were answered to her satisfaction today. Our contact information was provided should additional questions or concerns arise. Thank you for the referral and allowing [name] to share in the care of your patient.   [name], MS, [facility] Genetic Counselor [email or web address] (P) [phone]  The patient was seen for a total of 20 minutes in face-to-face genetic counseling.  The patient brought her husband. Drs. Magrinat, Lindi Adie and/or Burr Medico were available to discuss this case as needed.  _______________________________________________________________________ For Office Staff:  Number of people involved in session: 2 Was an Intern/ student involved with case: no

## 2021-06-10 NOTE — Therapy (Signed)
[place], [place], [address] Phone: [phone]   Fax:  [phone]  Physical Therapy Evaluation  Patient Details  Name: Ebony Lamb MRN: [id number] Date of Birth: 12-05-1975 Referring Provider (PT): Dr. Coralie Keens   Encounter Date: 06/10/2021   PT End of Session - 06/10/21 1215     Visit Number 1    Number of Visits 2    Date for PT Re-Evaluation 12/08/21    PT Start Time 1004    PT Stop Time 1025   Also saw pt from 1134-1210 for a total of 57 min   PT Time Calculation (min) 21 min    Activity Tolerance Patient tolerated treatment well    Behavior During Therapy [facility] for tasks assessed/performed             Past Medical History:  Diagnosis Date   Abnormal Pap smear 09/13/2000   Allergy 02/2020   Seasonal   Depression    history of depression- denies currently   H/O varicella    Hypertension 09/14/2011   post partum- meds x 1 mo   No pertinent past medical history    Preterm labor     Past Surgical History:  Procedure Laterality Date   BREAST BIOPSY Right 07/27/2019   CESAREAN SECTION  11/14/2011   Procedure: CESAREAN SECTION;  Surgeon: Eli Hose, MD;  Location: [facility];  Service: Gynecology;  Laterality: N/A;  Primary cesarean section with delivery of baby boy at [phone]. Apgars 9/9.   CESAREAN SECTION N/A 03/22/2014   Procedure: Repeat CESAREAN SECTION;  Surgeon: Alwyn Pea, MD;  Location: [facility];  Service: Obstetrics;  Laterality: N/A;   COSMETIC SURGERY  11/22/2017   Abdominoplasty   FOOT SURGERY     FRACTURE SURGERY  08/1999   Repair of 5th metatarsal in left foot   HERNIA REPAIR  11/22/2017   TUBAL LIGATION  11/22/2017   Tubes were completely removed   WISDOM TOOTH EXTRACTION      There were no vitals filed for this visit.    Subjective Assessment - 06/10/21 1041     Subjective Patient reports she is here today ot be seen by her medical team for her newly  diagnosed right breast cancer.    Patient is accompained by: Family member    Pertinent History Patient was diagnosed on 06/01/2021 with right grade III invasive ductal carcinoma breast cancer. There are 2 areas that measure 5 mm and 3 cm in the upper outer quadrant which are 2.5 cm apart. It is weakly ER/PR positive and HER2 negative with a Ki67 of 70%. She had an axillary lymph node biopsied and it was negative.    Patient Stated Goals Reduce lymphedema risk and learn post op HEP    Currently in Pain? Yes    Pain Score 5     Pain Location Back    Pain Orientation Mid;Lower    Pain Descriptors / Indicators Aching    Pain Type Acute pain    Pain Onset 1 to 4 weeks ago    Pain Frequency Intermittent    Aggravating Factors  Prolonged sitting    Pain Relieving Factors Laying down                [facility] PT Assessment - 06/10/21 0001       Assessment   Medical Diagnosis Right breast cancer    Referring Provider (PT) Dr. Coralie Keens    Onset Date/Surgical Date  06/01/21    Hand Dominance Right    Prior Therapy none      Precautions   Precautions Other (comment)    Precaution Comments active cancer      Restrictions   Weight Bearing Restrictions No      Balance Screen   Has the patient fallen in the past 6 months No    Has the patient had a decrease in activity level because of a fear of falling?  No    Is the patient reluctant to leave their home because of a fear of falling?  No      Home Social worker Private residence    Living Arrangements Spouse/significant other;Children   Husband, 62 and 28 y.o. kids   Available Help at Discharge Family      Prior Function   Level of Independence Independent    Vocation Full time employment    Event organiser    Leisure Runs 3x/week for 30-45 min and does Barre class once a week      Cognition   Overall Cognitive Status Within Functional Limits for tasks assessed      Posture/Postural Control    Posture/Postural Control Postural limitations    Postural Limitations Rounded Shoulders;Forward head      ROM / Strength   AROM / PROM / Strength AROM;Strength      AROM   Overall AROM Comments Cervical AROM is WNL    AROM Assessment Site Shoulder    Right/Left Shoulder Right;Left    Right Shoulder Extension 57 Degrees    Right Shoulder Flexion 166 Degrees    Right Shoulder ABduction 168 Degrees    Right Shoulder Internal Rotation 75 Degrees    Right Shoulder External Rotation 90 Degrees    Left Shoulder Extension 60 Degrees    Left Shoulder Flexion 155 Degrees    Left Shoulder ABduction 175 Degrees    Left Shoulder Internal Rotation 73 Degrees    Left Shoulder External Rotation 85 Degrees      Strength   Overall Strength Within functional limits for tasks performed               LYMPHEDEMA/ONCOLOGY QUESTIONNAIRE - 06/10/21 0001       Type   Cancer Type Right breast cancer      Lymphedema Assessments   Lymphedema Assessments Upper extremities      Right Upper Extremity Lymphedema   10 cm Proximal to Olecranon Process 24 cm    Olecranon Process 23.4 cm    10 cm Proximal to Ulnar Styloid Process 19 cm    Just Proximal to Ulnar Styloid Process 14.8 cm    Across Hand at [employer] 17.1 cm    At [place] of 2nd Digit 5.9 cm      Left Upper Extremity Lymphedema   10 cm Proximal to Olecranon Process 23.7 cm    Olecranon Process 22.8 cm    10 cm Proximal to Ulnar Styloid Process 18.3 cm    Just Proximal to Ulnar Styloid Process 14.2 cm    Across Hand at [employer] 18.2 cm    At [place] of 2nd Digit 5.7 cm             L-DEX FLOWSHEETS - 06/10/21 1200       L-DEX LYMPHEDEMA SCREENING   Measurement Type Unilateral    L-DEX MEASUREMENT EXTREMITY Upper Extremity    POSITION  Standing    DOMINANT SIDE Right  At Risk Side Right    BASELINE SCORE (UNILATERAL) -0.7             The patient was assessed using the L-Dex machine today to produce a  lymphedema index baseline score. The patient will be reassessed on a regular basis (typically every 3 months) to obtain new L-Dex scores. If the score is > 6.5 points away from his/her baseline score indicating onset of subclinical lymphedema, it will be recommended to wear a compression garment for 4 weeks, 12 hours per day and then be reassessed. If the score continues to be > 6.5 points from baseline at reassessment, we will initiate lymphedema treatment. Assessing in this manner has a 95% rate of preventing clinically significant lymphedema.      [name] - 06/10/21 0001     Open a tight or new jar Mild difficulty    Do heavy household chores (wash walls, wash floors) Mild difficulty    Carry a shopping bag or briefcase Mild difficulty    Wash your back No difficulty    Use a knife to cut food No difficulty    Recreational activities in which you take some force or impact through your arm, shoulder, or hand (golf, hammering, tennis) Mild difficulty    During the past week, to what extent has your arm, shoulder or hand problem interfered with your normal social activities with family, friends, neighbors, or groups? Not at all    During the past week, to what extent has your arm, shoulder or hand problem limited your work or other regular daily activities Slightly    Arm, shoulder, or hand pain. Moderate    Tingling (pins and needles) in your arm, shoulder, or hand None    Difficulty Sleeping Moderate difficulty    DASH Score 20.45 %              Objective measurements completed on examination: See above findings.       Patient was instructed today in a home exercise program today for post op shoulder range of motion. These included active assist shoulder flexion in sitting, scapular retraction, wall walking with shoulder abduction, and hands behind head external rotation.  She was encouraged to do these twice a day, holding 3 seconds and repeating 5 times when permitted by her  physician.           PT Education - 06/10/21 1214     Education Details Lymphedema risk reduction and post op shoulder ROM HEP    Person(s) Educated Patient;Spouse    Methods Explanation;Demonstration;Handout    Comprehension Returned demonstration;Verbalized understanding                 PT Long Term Goals - 06/10/21 1232       PT LONG TERM GOAL #1   Title Patient will demonstrate she has regained full shoulder ROM and function post operatively compared ot baselines.    Time 6    Period Months    Status New    Target Date 12/08/21             Breast Clinic Goals - 06/10/21 1231       Patient will be able to verbalize understanding of pertinent lymphedema risk reduction practices relevant to her diagnosis specifically related to skin care.   Time 1    Period Days    Status Achieved      Patient will be able to return demonstrate and/or verbalize understanding of the post-op home exercise  program related to regaining shoulder range of motion.   Time 1    Period Days    Status Achieved      Patient will be able to verbalize understanding of the importance of attending the postoperative After Breast Cancer Class for further lymphedema risk reduction education and therapeutic exercise.   Time 1    Period Days    Status Achieved                   Plan - 06/10/21 1216     Clinical Impression Statement Patient was diagnosed on 06/01/2021 with right grade III invasive ductal carcinoma breast cancer. There are 2 areas that measure 5 mm and 3 cm in the upper outer quadrant which are 2.5 cm apart. It is weakly ER/PR positive and HER2 negative with a Ki67 of 70%. She had an axillary lymph node biopsied and it was negative. Her multidisciplinary medical team met prior to her assessments to determine a recommended treatment plan. She is planning to have neoadjuvant chemotherapy followed by likely a bilateral mastectomy with a right sentinel node biopsy with or  without reconstruction. She will benefit from a post op PT reassessment to determine needs and from L-Dex screens every 3 months for 2 years to detect subclinical lymphedema.    Stability/Clinical Decision Making Stable/Uncomplicated    Clinical Decision Making Low    Rehab Potential Excellent    PT Frequency --   Eval and 1 f/u visit   PT Treatment/Interventions ADLs/Self Care Home Management;Therapeutic exercise;Patient/family education    PT Next Visit Plan Reassess 3-4 weeks post op    PT Home Exercise Plan Post op HEP    Consulted and Agree with Plan of Care Patient;Family member/caregiver    Family Member Consulted husband             Patient will benefit from skilled therapeutic intervention in order to improve the following deficits and impairments:  Postural dysfunction, Decreased range of motion, Decreased knowledge of precautions, Impaired UE functional use, Pain  Visit Diagnosis: Malignant neoplasm of upper-outer quadrant of right breast in female, estrogen receptor positive ([place]) - Plan: PT plan of care cert/re-cert  Abnormal posture - Plan: PT plan of care cert/re-cert  Patient will follow up at outpatient cancer rehab 3-4 weeks following surgery.  If the patient requires physical therapy at that time, a specific plan will be dictated and sent to the referring physician for approval. The patient was educated today on appropriate basic range of motion exercises to begin post operatively and the importance of attending the After Breast Cancer class following surgery.  Patient was educated today on lymphedema risk reduction practices as it pertains to recommendations that will benefit the patient immediately following surgery.  She verbalized good understanding.      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

## 2021-06-10 NOTE — Patient Instructions (Signed)

## 2021-06-11 ENCOUNTER — Inpatient Hospital Stay: Payer: No Typology Code available for payment source | Admitting: Dietician

## 2021-06-11 ENCOUNTER — Telehealth: Payer: Self-pay | Admitting: *Deleted

## 2021-06-11 NOTE — Progress Notes (Signed)
Nutrition  Patient attended Nutrition 101 class on 06/11/21. Patient received education on plant-based diet and evidenced based nutrition recommendations. Handouts and smoothie recipes provided. All questions answered. Patient encouraged to contact with additional questions or concerns.

## 2021-06-11 NOTE — Telephone Encounter (Signed)
Spoke to pt concerning [place] from 9.28.22. Denies questions or concerns regarding dx or treatment care plan. Pt scheduled for port on same day as CT/bone scan. Informed pt that scans will be moved prior to chemo.  Pt informed having 2nd opinion at [facility] on 10/13 which pt is scheduled to start chemo same day. Pt would like to move appt up with [facility]. Informed pt to reach out to [facility] to request appt change. Msg sent to nurse navigator at [facility] by this RN to see if appt could be moved up.  Encourage pt to reach out with further needs or questions. Received verbal understanding.

## 2021-06-12 ENCOUNTER — Other Ambulatory Visit: Payer: No Typology Code available for payment source

## 2021-06-12 ENCOUNTER — Encounter: Payer: Self-pay | Admitting: *Deleted

## 2021-06-15 ENCOUNTER — Encounter: Payer: Self-pay | Admitting: Oncology

## 2021-06-15 ENCOUNTER — Ambulatory Visit
Admission: RE | Admit: 2021-06-15 | Discharge: 2021-06-15 | Disposition: A | Discharge status: Home / Self Care | Payer: No Typology Code available for payment source | Source: Ambulatory Visit | Attending: Oncology | Admitting: Oncology

## 2021-06-15 ENCOUNTER — Other Ambulatory Visit: Payer: Self-pay

## 2021-06-15 DIAGNOSIS — Z170 Estrogen receptor positive status [ER+]: Secondary | ICD-10-CM

## 2021-06-15 DIAGNOSIS — C50411 Malignant neoplasm of upper-outer quadrant of right female breast: Secondary | ICD-10-CM

## 2021-06-15 IMAGING — MR MR BREAST BILAT WO/W CM
8 of 14 series · 30 of 48 positions shown · IV contrast (gadavist)
Comparison: Previous exam(s).
COMPARISON: Previous exam(s).

Addendum:
CLINICAL DATA: Staging for new breast cancer diagnosis. Recent
ultrasound-guided core biopsy of mass in the 12 o'clock location of
the RIGHT breast shows grade 3 invasive ductal carcinoma. Biopsy of
RIGHT axillary lymph node shows concordant benign lymph node tissue,
negative for metastatic disease.

LABS:  None obtained at the time of imaging.
EXAM:
BILATERAL BREAST MRI WITH AND WITHOUT CONTRAST
TECHNIQUE: Multiplanar, multisequence MR images of both breasts were obtained
prior to and following the intravenous administration of 5 ml of
Gadavist

[Series 2: t2_tirm_tra ipat (a-p) · axial · 3.0mm · 0.70mm/px · 1 of 53 slices shown]
[im 1/53]
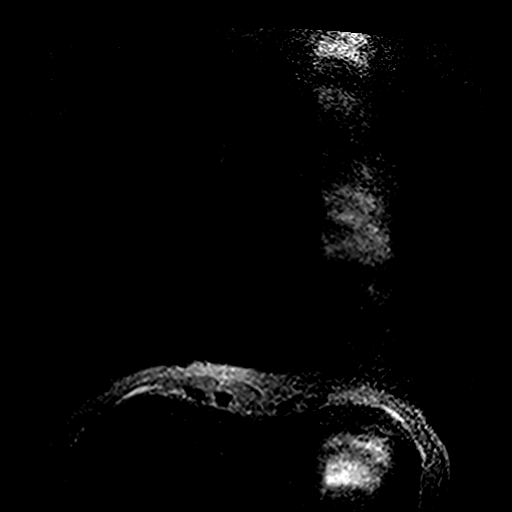

[Series 3: fl3d pre-cm no · axial · non-contrast · 1.2mm · 0.89mm/px · z∈[-105,+66]mm · 5 of 144 slices shown]
[im 1/144]
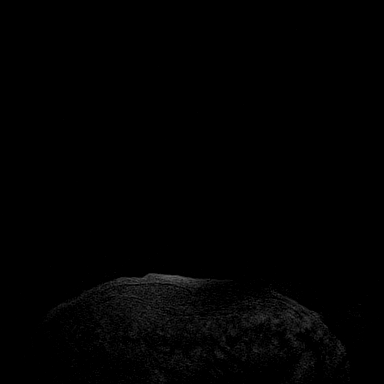
[im 36/144]
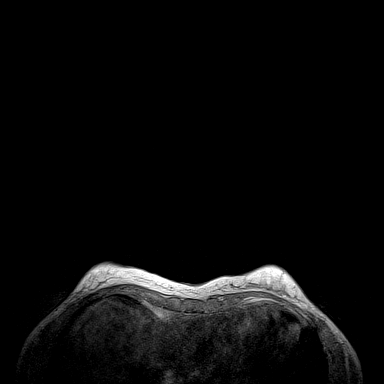
[im 72/144]
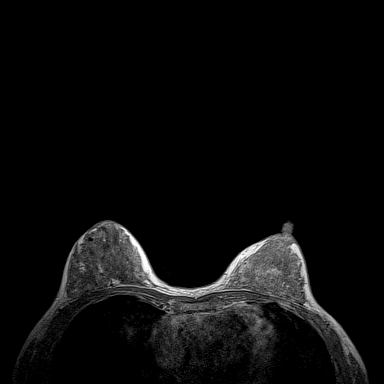
[im 108/144]
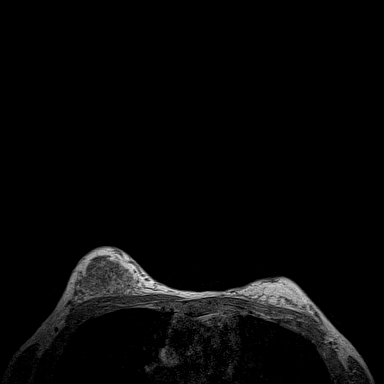
[im 144/144]
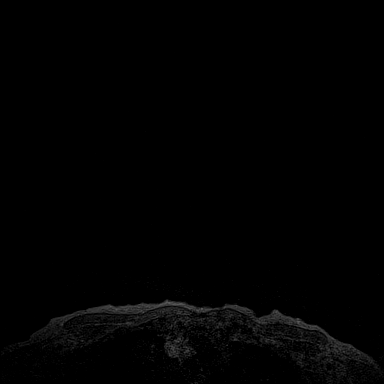

[Series 4: fl3d pre-cm · axial · non-contrast · 1.2mm · 0.89mm/px · z∈[-105,+66]mm · 5 of 144 slices shown]
[im 1/144]
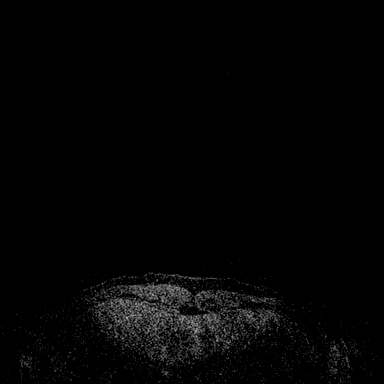
[im 36/144]
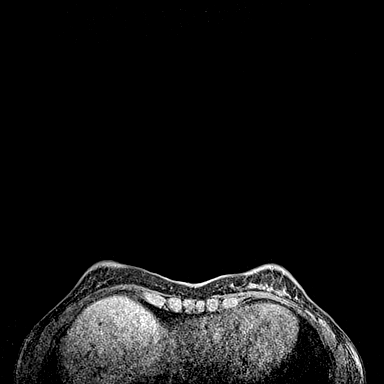
[im 72/144]
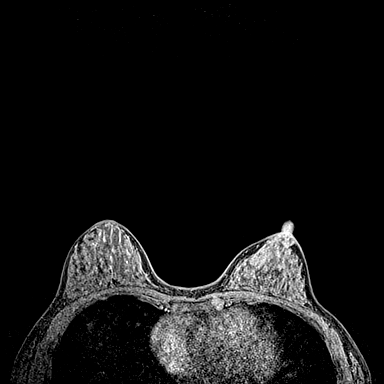
[im 108/144]
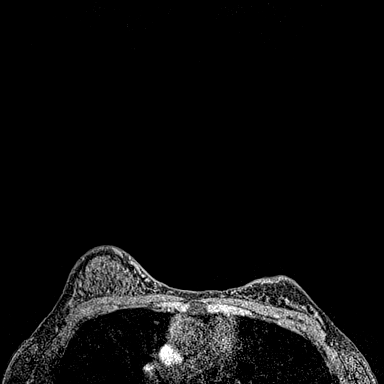
[im 144/144]
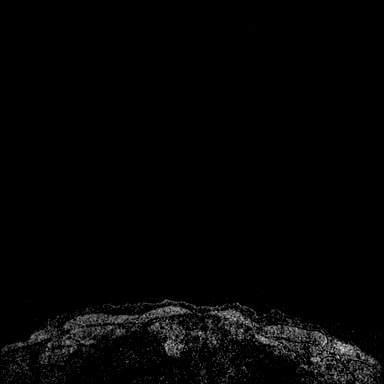

[Series 5: fl3d post-cm 20 · axial · 1.2mm · 0.89mm/px · z∈[-105,+66]mm · 5 of 144 slices shown (1 of 3)]
[im 1/144]
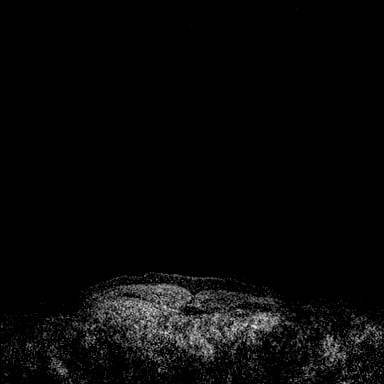
[im 36/144]
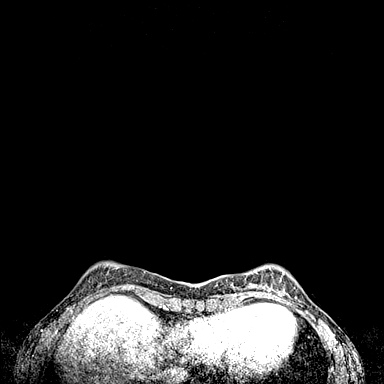
[im 72/144]
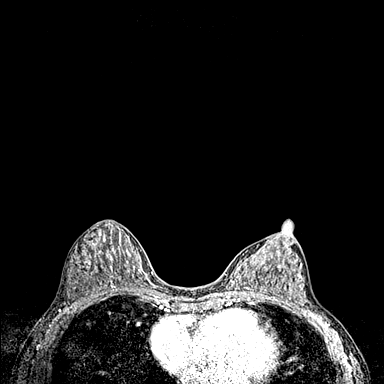
[im 108/144]
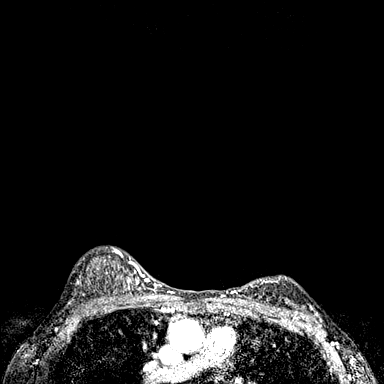
[im 144/144]
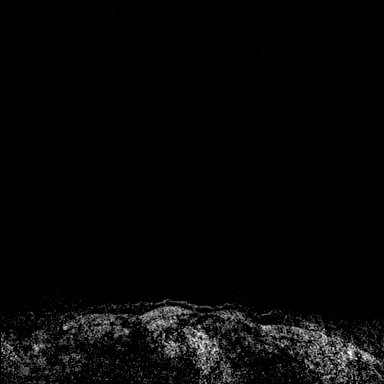

[Series 6: fl3d post-cm 20 · axial · 1.2mm · 0.89mm/px · z∈[-105,+66]mm · 5 of 144 slices shown (2 of 3)]
[im 1/144]
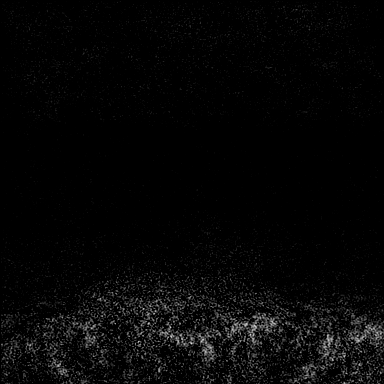
[im 36/144]
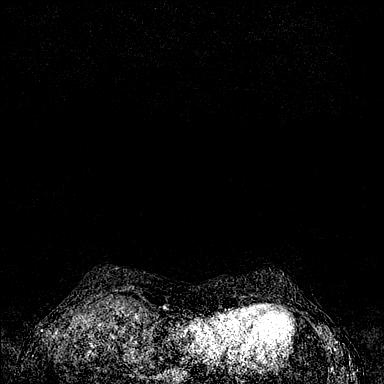
[im 72/144]
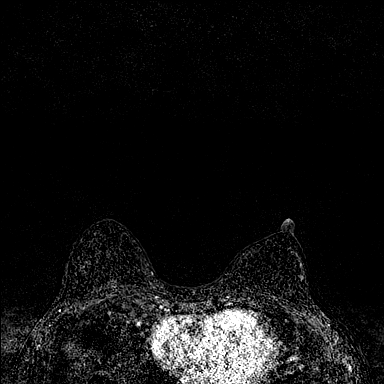
[im 108/144]
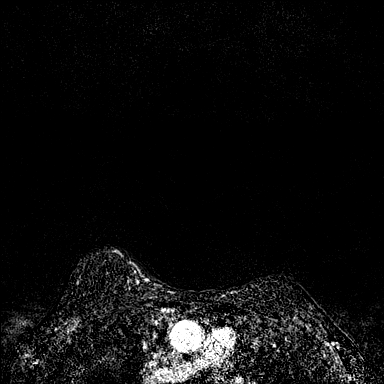
[im 144/144]
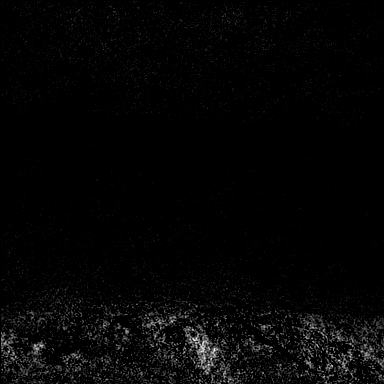

[Series 7: fl3d post-cm 20 · axial · 172.8mm · 0.89mm/px · 1 of 1 slices shown (3 of 3)]
[im 1/1]
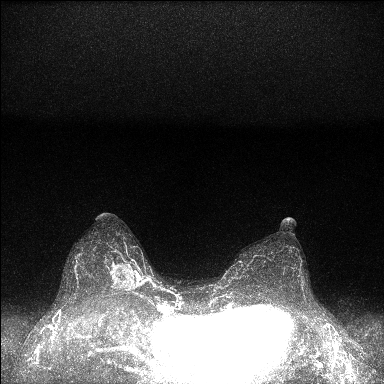

[Series 8: fl3d post-cm 3 · axial · 1.2mm · 0.89mm/px · z∈[-105,+66]mm · 5 of 144 slices shown (1 of 2)]
[im 1/144]
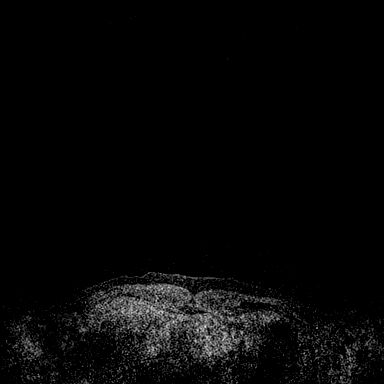
[im 36/144]
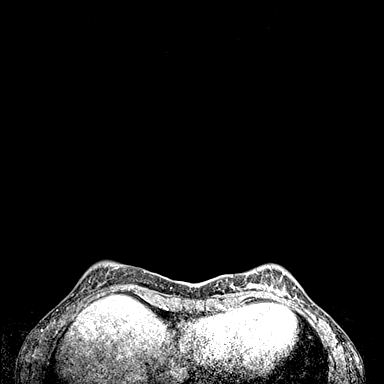
[im 72/144]
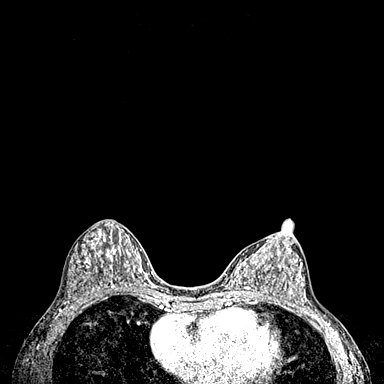
[im 108/144]
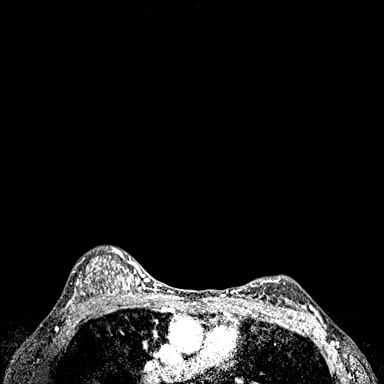
[im 144/144]
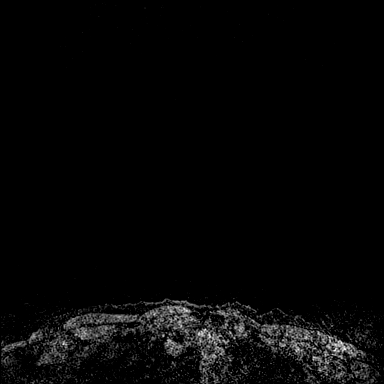

[Series 9: fl3d post-cm 3 · axial · 1.2mm · 0.89mm/px · z∈[-105,-20]mm · 3 of 144 slices shown (2 of 2)]
[im 1/144]
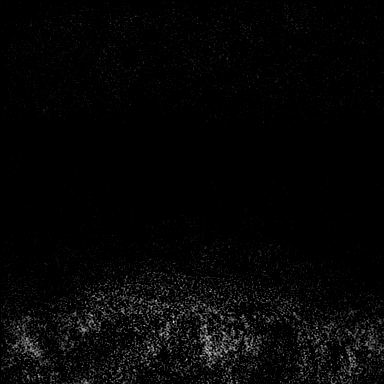
[im 36/144]
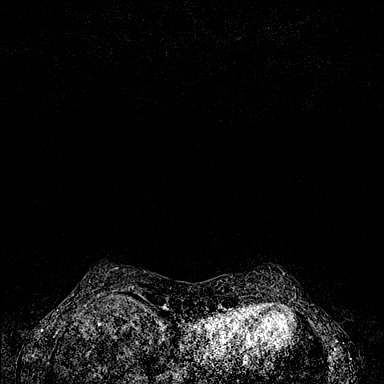
[im 72/144]
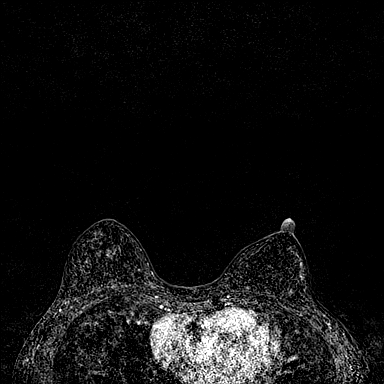

[30 of 48 positions shown; findings below may reference images not displayed]

Three-dimensional MR images were rendered by post-processing of the
original MR data on an independent workstation. The
three-dimensional MR images were interpreted, and findings are
reported in the following complete MRI report for this study. Three
dimensional images were evaluated at the independent interpreting
workstation using the DynaCAD thin client.
FINDINGS: Breast composition: d. Extreme fibroglandular tissue.

Background parenchymal enhancement: Minimal

Right breast: Within the UPPER INNER QUADRANT of the RIGHT breast
there is a rib enhancing mass measuring 2.6 x 2.2 x 3.1 centimeters.
Within the central aspect of the mass, tissue marker clip is
consistent with recent core biopsy which shows grade 3 invasive
ductal carcinoma. The size on MR and ultrasound are similar.
Suspected satellite nodule seen on ultrasound is not well seen on
MRI.

Left breast: No mass or abnormal enhancement.

Lymph nodes: No abnormal appearing lymph nodes. Tissue marker clip
is identified within a RIGHT axillary lymph node on image 7 of
series 2, marking the site of recently biopsied benign concordant
lymph node.

Ancillary findings:  None.
IMPRESSION: 1. 3.1 centimeter mass in the UPPER INNER QUADRANT of the RIGHT
breast consistent with known grade 3 invasive ductal carcinoma.
2. LEFT breast is negative.

RECOMMENDATION:
Recommend treatment plan for known RIGHT breast malignancy.

BI-RADS CATEGORY  6: Known biopsy-proven malignancy.

ADDENDUM:
Please note corrections to report, below.

Right breast: Within the UPPER INNER QUADRANT of the RIGHT breast
there is a rim enhancing mass measuring 2.6 x 2.2 x 3.1 centimeters.

*** End of Addendum ***
Three-dimensional MR images were rendered by post-processing of the
original MR data on an independent workstation. The
three-dimensional MR images were interpreted, and findings are
reported in the following complete MRI report for this study. Three
dimensional images were evaluated at the independent interpreting
workstation using the DynaCAD thin client.
FINDINGS: Breast composition: d. Extreme fibroglandular tissue.

Background parenchymal enhancement: Minimal

Right breast: Within the UPPER INNER QUADRANT of the RIGHT breast
there is a rib enhancing mass measuring 2.6 x 2.2 x 3.1 centimeters.
Within the central aspect of the mass, tissue marker clip is
consistent with recent core biopsy which shows grade 3 invasive
ductal carcinoma. The size on MR and ultrasound are similar.
Suspected satellite nodule seen on ultrasound is not well seen on
MRI.

Left breast: No mass or abnormal enhancement.

Lymph nodes: No abnormal appearing lymph nodes. Tissue marker clip
is identified within a RIGHT axillary lymph node on image 7 of
series 2, marking the site of recently biopsied benign concordant
lymph node.

Ancillary findings:  None.
IMPRESSION: 1. 3.1 centimeter mass in the UPPER INNER QUADRANT of the RIGHT
breast consistent with known grade 3 invasive ductal carcinoma.
2. LEFT breast is negative.

RECOMMENDATION:
Recommend treatment plan for known RIGHT breast malignancy.

BI-RADS CATEGORY  6: Known biopsy-proven malignancy.

## 2021-06-15 MED ORDER — GADOBUTROL 1 MMOL/ML IV SOLN
5.0000 mL | Freq: Once | INTRAVENOUS | Status: AC | PRN
  Administered 2021-06-15: 5 mL via INTRAVENOUS

## 2021-06-16 ENCOUNTER — Encounter (HOSPITAL_BASED_OUTPATIENT_CLINIC_OR_DEPARTMENT_OTHER): Payer: Self-pay | Admitting: Surgery

## 2021-06-16 ENCOUNTER — Other Ambulatory Visit: Payer: Self-pay

## 2021-06-16 ENCOUNTER — Encounter: Payer: Self-pay | Admitting: *Deleted

## 2021-06-16 NOTE — Progress Notes (Signed)
The following biosimilar Ziextenzo (pegfilgrastim-bmez) has been selected for use in this patient.  Kennith Center, Pharm.D., CPP 06/16/2021@1 :24 PM

## 2021-06-17 ENCOUNTER — Telehealth: Payer: Self-pay | Admitting: Genetic Counselor

## 2021-06-17 ENCOUNTER — Encounter: Payer: Self-pay | Admitting: Genetic Counselor

## 2021-06-17 ENCOUNTER — Ambulatory Visit (INDEPENDENT_AMBULATORY_CARE_PROVIDER_SITE_OTHER): Payer: No Typology Code available for payment source | Admitting: Plastic Surgery

## 2021-06-17 DIAGNOSIS — Z170 Estrogen receptor positive status [ER+]: Secondary | ICD-10-CM

## 2021-06-17 DIAGNOSIS — C50411 Malignant neoplasm of upper-outer quadrant of right female breast: Secondary | ICD-10-CM

## 2021-06-17 DIAGNOSIS — Z1379 Encounter for other screening for genetic and chromosomal anomalies: Secondary | ICD-10-CM | POA: Insufficient documentation

## 2021-06-17 NOTE — Progress Notes (Signed)
Referring Provider Antony Contras, MD [place],  [place] [address]   CC:  Chief Complaint  Patient presents with   Advice Only      Ebony Lamb is an 45 y.o. female.  HPI: Patient presents with a new diagnosis of right breast cancer.  She has seen Dr. Ninfa Linden as her breast surgeon has discussed a number of surgical options for treatment.  It sounds like based on the current size of her mass she might be better suited for mastectomy.  However, she is planning to have neoadjuvant chemotherapy and she may have more surgical options after that is completed.  She wants to get some baseline information on reconstruction that would hopefully help inform her decision-making process.  No Known Allergies  Outpatient Encounter Medications as of 06/17/2021  Medication Sig   dexamethasone (DECADRON) 4 MG tablet Take 2 tablets (8 mg total) by mouth daily. Take daily for 3 days after chemo. Take with food.   ibuprofen (ADVIL,MOTRIN) 600 MG tablet Take 1 tablet (600 mg total) by mouth every 6 (six) hours as needed for fever, headache, mild pain, moderate pain or cramping.   lidocaine-prilocaine (EMLA) cream Apply to affected area once   LORazepam (ATIVAN) 0.5 MG tablet Take 1 tablet (0.5 mg total) by mouth at bedtime as needed (Nausea or vomiting).   oxyCODONE-acetaminophen (PERCOCET/ROXICET) 5-325 MG per tablet Take 1-2 tablets by mouth every 4 (four) hours as needed for moderate pain or severe pain (moderate - severe pain).   prochlorperazine (COMPAZINE) 10 MG tablet Take 1 tablet (10 mg total) by mouth every 6 (six) hours as needed (Nausea or vomiting).   [DISCONTINUED] ferrous sulfate (FERROUSUL) 325 (65 FE) MG tablet Take 1 tablet (325 mg total) by mouth 2 (two) times daily.   [DISCONTINUED] fluticasone (FLONASE) 50 MCG/ACT nasal spray Place 1-2 sprays into both nostrils daily for 7 days.   Facility-Administered Encounter Medications as of 06/17/2021  Medication    influenza vac split quadrivalent PF (FLUARIX) 0.5 ML injection   influenza vac split quadrivalent PF (FLUARIX) injection 0.5 mL     Past Medical History:  Diagnosis Date   Abnormal Pap smear 09/13/2000   Allergy 02/2020   Seasonal   Depression    history of depression- denies currently   H/O varicella    Hypertension 09/14/2011   post partum- meds x 1 mo   No pertinent past medical history    Preterm labor     Past Surgical History:  Procedure Laterality Date   BREAST BIOPSY Right 07/27/2019   CESAREAN SECTION  11/14/2011   Procedure: CESAREAN SECTION;  Surgeon: Eli Hose, MD;  Location: [facility];  Service: Gynecology;  Laterality: N/A;  Primary cesarean section with delivery of baby boy at [phone]. Apgars 9/9.   CESAREAN SECTION N/A 03/22/2014   Procedure: Repeat CESAREAN SECTION;  Surgeon: Alwyn Pea, MD;  Location: [facility];  Service: Obstetrics;  Laterality: N/A;   COSMETIC SURGERY  11/22/2017   Abdominoplasty   FOOT SURGERY     FRACTURE SURGERY  08/1999   Repair of 5th metatarsal in left foot   HERNIA REPAIR  11/22/2017   TUBAL LIGATION  11/22/2017   Tubes were completely removed   WISDOM TOOTH EXTRACTION      Family History  Problem Relation Age of Onset   Breast cancer Mother 81   Osteopenia Mother    Arthritis Mother    Other Mother  BRCA2 gene mutation   Hypertension Father    Kidney disease Father    Depression Father    Hyperlipidemia Father    Melanoma Father 78   Hypertension Brother    Hyperlipidemia Brother    Breast cancer Maternal Grandmother        dx. late 30s/40s   Heart disease Maternal Grandfather    Other Maternal Grandfather        BRCA2 gene mutation   Ovarian cancer Paternal Grandmother        dx. 44s   Stroke Paternal Grandfather    Birth defects Son    Anesthesia problems Neg Hx    Diabetes Neg Hx     Social History   Social History Narrative   Not on file     Review of Systems General: Denies fevers, chills,  weight loss CV: Denies chest pain, shortness of breath, palpitations  Physical Exam Vitals with BMI 06/16/2021 06/10/2021 04/21/2019  Height '5\' 6"'  '5\' 6"'  -  Weight 125 lbs 125 lbs -  BMI 20.03 79.44 -  Systolic - 461 901  Diastolic - 90 78  Pulse - 87 79    General:  No acute distress,  Alert and oriented, Non-Toxic, Normal speech and affect Breast: No ptosis.  A or B cup size generally.  Symmetry is pretty close.  No obvious scars.  Assessment/Plan Patient presents to discuss reconstructive options for her cancer treatment.  I long talk with her about what her choices would be along the various pathways.  If she does end up being a candidate for breast conservation therapy then she would not likely need any reconstructive procedures.  If she opts for either unilateral or bilateral mastectomy I went through the process for what implant-based reconstruction would be like.  I explained it was a two-stage process and that tissue expander would be placed at the time of her mastectomy.  This would subsequently be switched out to a gel implant.  If she chose a unilateral cancer surgery then procedures could be performed on the contralateral side to help with symmetry.  She does not want to be much bigger than she is now.  I did also discussed autologous options with her.  However she does not always have a lot of excess tissue in either the back or the abdomen so there would be some limit to how much volume could be added through those avenues.  She is can to avoid flap surgery if possible which I am in agreement with.  She does have some time to think about this as she is scheduled to begin neoadjuvant chemotherapy and will have some months to think this over.  I will plan to see her again as its getting towards the end of her chemo treatment so that we could refresh this conversation and revisit her options.  [name] 06/17/2021, 9:59 PM

## 2021-06-17 NOTE — Telephone Encounter (Signed)
I contacted Ebony Lamb to discuss her genetic testing results. No pathogenic variants were identified in the first 8 genes analyzed. We are still waiting on the additional 69 genes and will call her once those results are available.  The test report has been scanned into EPIC and is located under the Molecular Pathology section of the Results Review tab.  A portion of the result report is included below for reference. Detailed clinic note to follow.  [name], MS, [facility] Genetic Counselor [email or web address] (P) [phone]

## 2021-06-18 NOTE — Progress Notes (Signed)
Pharmacist Chemotherapy Monitoring - Initial Assessment    Anticipated start date: 06/25/21   The following has been reviewed per standard work regarding the patient's treatment regimen: The patient's diagnosis, treatment plan and drug doses, and organ/hematologic function Lab orders and baseline tests specific to treatment regimen  The treatment plan start date, drug sequencing, and pre-medications Prior authorization status  Patient's documented medication list, including drug-drug interaction screen and prescriptions for anti-emetics and supportive care specific to the treatment regimen The drug concentrations, fluid compatibility, administration routes, and timing of the medications to be used The patient's access for treatment and lifetime cumulative dose history, if applicable  The patient's medication allergies and previous infusion related reactions, if applicable   Changes made to treatment plan:  N/A  Follow up needed:  N/A   Larene Beach, RPH, 06/18/2021  2:21 PM

## 2021-06-19 ENCOUNTER — Other Ambulatory Visit (HOSPITAL_COMMUNITY): Payer: No Typology Code available for payment source

## 2021-06-19 ENCOUNTER — Other Ambulatory Visit: Payer: No Typology Code available for payment source

## 2021-06-19 NOTE — Addendum Note (Signed)
Addended by: Lindon Romp on: 06/19/2021 12:21 PM   Modules accepted: Orders

## 2021-06-22 ENCOUNTER — Other Ambulatory Visit: Payer: Self-pay | Admitting: Oncology

## 2021-06-22 ENCOUNTER — Inpatient Hospital Stay: Payer: No Typology Code available for payment source | Attending: Oncology

## 2021-06-22 ENCOUNTER — Other Ambulatory Visit: Payer: Self-pay

## 2021-06-22 ENCOUNTER — Ambulatory Visit (HOSPITAL_COMMUNITY)
Admission: RE | Admit: 2021-06-22 | Discharge: 2021-06-22 | Disposition: A | Discharge status: Home / Self Care | Payer: No Typology Code available for payment source | Source: Ambulatory Visit | Attending: Oncology | Admitting: Oncology

## 2021-06-22 ENCOUNTER — Encounter (HOSPITAL_COMMUNITY)
Admission: RE | Admit: 2021-06-22 | Discharge: 2021-06-22 | Disposition: A | Discharge status: Home / Self Care | Payer: No Typology Code available for payment source | Source: Ambulatory Visit | Attending: Oncology | Admitting: Oncology

## 2021-06-22 ENCOUNTER — Ambulatory Visit (HOSPITAL_BASED_OUTPATIENT_CLINIC_OR_DEPARTMENT_OTHER)
Admission: RE | Admit: 2021-06-22 | Discharge: 2021-06-22 | Disposition: A | Discharge status: Home / Self Care | Payer: No Typology Code available for payment source | Source: Ambulatory Visit | Attending: Oncology | Admitting: Oncology

## 2021-06-22 ENCOUNTER — Encounter (HOSPITAL_COMMUNITY): Payer: Self-pay

## 2021-06-22 DIAGNOSIS — K649 Unspecified hemorrhoids: Secondary | ICD-10-CM | POA: Insufficient documentation

## 2021-06-22 DIAGNOSIS — Z170 Estrogen receptor positive status [ER+]: Secondary | ICD-10-CM

## 2021-06-22 DIAGNOSIS — E785 Hyperlipidemia, unspecified: Secondary | ICD-10-CM | POA: Insufficient documentation

## 2021-06-22 DIAGNOSIS — R5383 Other fatigue: Secondary | ICD-10-CM | POA: Insufficient documentation

## 2021-06-22 DIAGNOSIS — Z0189 Encounter for other specified special examinations: Secondary | ICD-10-CM | POA: Diagnosis not present

## 2021-06-22 DIAGNOSIS — C50411 Malignant neoplasm of upper-outer quadrant of right female breast: Secondary | ICD-10-CM | POA: Insufficient documentation

## 2021-06-22 DIAGNOSIS — F329 Major depressive disorder, single episode, unspecified: Secondary | ICD-10-CM | POA: Insufficient documentation

## 2021-06-22 DIAGNOSIS — Z5189 Encounter for other specified aftercare: Secondary | ICD-10-CM | POA: Insufficient documentation

## 2021-06-22 DIAGNOSIS — R059 Cough, unspecified: Secondary | ICD-10-CM | POA: Insufficient documentation

## 2021-06-22 DIAGNOSIS — I10 Essential (primary) hypertension: Secondary | ICD-10-CM | POA: Insufficient documentation

## 2021-06-22 DIAGNOSIS — Z5111 Encounter for antineoplastic chemotherapy: Secondary | ICD-10-CM | POA: Insufficient documentation

## 2021-06-22 DIAGNOSIS — H538 Other visual disturbances: Secondary | ICD-10-CM | POA: Insufficient documentation

## 2021-06-22 HISTORY — DX: Malignant (primary) neoplasm, unspecified: C80.1

## 2021-06-22 LAB — ECHOCARDIOGRAM COMPLETE
Area-P 1/2: 3.46 cm2
S' Lateral: 3.55 cm

## 2021-06-22 IMAGING — NM NM BONE WHOLE BODY
2 series · 2 of 2 positions shown · non-contrast
Comparison: CT [DATE].

CLINICAL DATA: History of breast cancer.

EXAM:
NUCLEAR MEDICINE WHOLE BODY BONE SCAN
TECHNIQUE: Whole body anterior and posterior images were obtained approximately
3 hours after intravenous injection of radiopharmaceutical.
RADIOPHARMACEUTICALS:  21.3 mCi [O0] MDP IV

[Series 1: whole body · 2.66mm/px · 1 of 1 slices shown (1 of 2)]
[im 1/1]
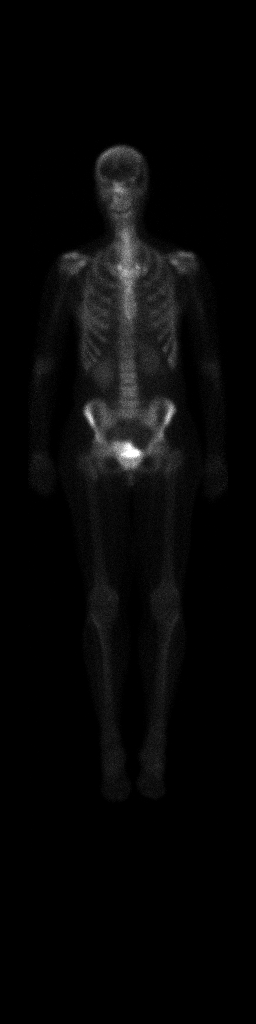

[Series 1: whole body · 2.66mm/px · 1 of 1 slices shown (2 of 2)]
[im 1/1]
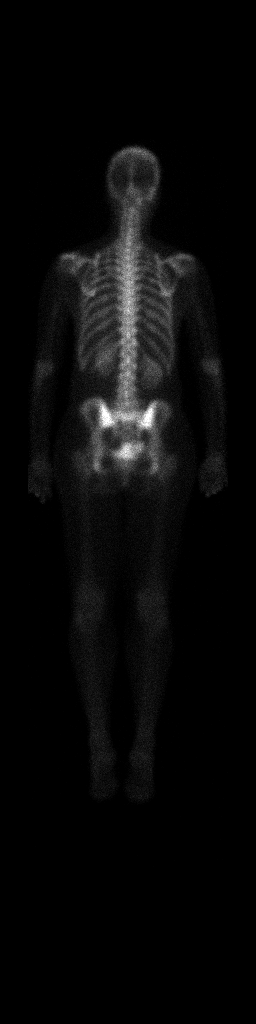

[2 of 2 positions shown; findings below may reference images not displayed]

FINDINGS: Bilateral renal function and excretion. Increased activity noted
over both SI joints. Sacroiliitis could present in this fashion.
Questionable increased activity noted about the lower lumbar
spine/sacrum. AP view of the pelvis suggested for further
evaluation. No other bony abnormalities identified.
IMPRESSION: 1. Increased activity noted over the best SI joints. Sacroiliitis
could present in this fashion. Questionable increased activity noted
about the lower lumbar spine/sacrum. AP view of the pelvis suggested
for further evaluation.

2.  No other bony abnormalities identified.

## 2021-06-22 MED ORDER — IOHEXOL 350 MG/ML SOLN
60.0000 mL | Freq: Once | INTRAVENOUS | Status: AC | PRN
  Administered 2021-06-22: 60 mL via INTRAVENOUS

## 2021-06-22 MED ORDER — TECHNETIUM TC 99M MEDRONATE IV KIT
21.3000 | PACK | Freq: Once | INTRAVENOUS | Status: AC
  Administered 2021-06-22: 21.3 via INTRAVENOUS

## 2021-06-22 NOTE — Progress Notes (Signed)

## 2021-06-22 NOTE — Progress Notes (Signed)
  Echocardiogram 2D Echocardiogram has been performed.  Ebony Lamb M 06/22/2021, 9:56 AM

## 2021-06-23 ENCOUNTER — Other Ambulatory Visit: Payer: Self-pay | Admitting: *Deleted

## 2021-06-23 ENCOUNTER — Encounter: Payer: Self-pay | Admitting: *Deleted

## 2021-06-23 DIAGNOSIS — Z170 Estrogen receptor positive status [ER+]: Secondary | ICD-10-CM

## 2021-06-23 DIAGNOSIS — C50411 Malignant neoplasm of upper-outer quadrant of right female breast: Secondary | ICD-10-CM

## 2021-06-23 MED ORDER — LORATADINE 10 MG PO TABS: 10.0000 mg | ORAL_TABLET | Freq: Every day | ORAL | 3 refills | Status: DC

## 2021-06-23 NOTE — H&P (Addendum)
PROVIDER:  Beverlee Nims, MD   MRN: [id number] DOB: 03/24/1976 DATE OF ENCOUNTER: 06/10/2021 Subjective  Chief Complaint: Breast Cancer     History of Present Illness:  .     This is a 45 year old female who palpated a mass in her breast several weeks ago.  Previous mammogram yearly was unremarkable.  She underwent mammograms and ultrasound showing a 3 cm mass as well as a second mass which was 5 mm.  The distance between 2 years was 2.5 cm.  Second mass was not biopsied.  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  Biopsy of lymph node was negative.  She has a family history of breast cancer in her mother triple negative.  She has had a benign biopsy of the mass in the past.  She is otherwise very healthy and has no cardiopulmonary issues.   Review of Systems: A complete review of systems was obtained from the patient.  I have reviewed this information and discussed as appropriate with the patient.  See HPI as well for other ROS.   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        No current facility-administered medications on file prior to visit.           Family History  Problem Relation Age of Onset   Breast cancer Mother     Cancer Mother     Osteopenia Mother     Skin cancer Father     Coronary Artery Disease (Blocked arteries around heart) Father     Hyperlipidemia (Elevated cholesterol) Father     High blood pressure (Hypertension) Father     Cancer Father     Kidney disease Father     Depression Father     Hyperlipidemia (Elevated cholesterol) Brother     High blood pressure (Hypertension) Brother     Cancer Maternal Grandmother     Breast cancer Maternal Grandmother     Heart disease Maternal Grandfather     Cancer Paternal Grandmother     Stroke Paternal Grandfather     Birth defects Son          Hypospadius      Social History       Tobacco Use  Smoking  Status Never Smoker  Smokeless Tobacco Never Used      Social History         Socioeconomic History   Marital status: Married  Tobacco Use   Smoking status: Never Smoker   Smokeless tobacco: Never Used  Scientific laboratory technician Use: Never used  Substance and Sexual Activity   Alcohol use: Yes      Comment: 1-2 drinks 3-4 days a week   Drug use: Never      Objective:    BP: (!) 146/90  Pulse: 87  Resp: 18  Temp: 98.1 F (36.7 C)  SpO2: 100%     Body mass index is 20.18 kg/m.     Physical Exam    She appears well on exam.   There is a small mass at the 12 o'clock position of the breast.  There is no enlargement found in the axilla which is very mobile.  There is no bruising of the breast.  The nipple areolar complex is normal  Lungs clear  CV RRR  Abdomen soft, NT   Labs, Imaging and Diagnostic Testing: I reviewed the mammograms, pathology results,   Assessment and Plan:  Right breast invasive ductal carcinoma   We have discussed her in detail in her multidisciplinary breast  cancer conference this morning.  This is a difficult situation.  We discussed neoadjuvant therapy and has breast conservation versus upfront mastectomy.  Even with neoadjuvant therapy she may require mastectomies regardless.  She will need a preoperative MRI to truly needed size of this right breast cancer as well as look for remote disease in the other breast.  Regardless of the surgical plans, she will need Port-A-Cath insertion for chemotherapy.  She will also require genetic testing given her family history.  We discussed neoadjuvant therapy as well as mastectomy with breast conservation.  We discussed sentinel lymph node biopsy as well.  After further discussion with the oncologist, we will determine whether or not to proceed with referral to plastic surgery implant surgery mastectomy and Port-A-Cath upfront versus proceeding with neoadjuvant chemotherapy  Addendum:  she has decided to proceed with neoadjuvant therapy so port a cath has been scheduled.  Risks were discussed.

## 2021-06-24 ENCOUNTER — Encounter (HOSPITAL_BASED_OUTPATIENT_CLINIC_OR_DEPARTMENT_OTHER): Payer: Self-pay | Admitting: Surgery

## 2021-06-24 ENCOUNTER — Encounter (HOSPITAL_BASED_OUTPATIENT_CLINIC_OR_DEPARTMENT_OTHER)
Admission: RE | Disposition: A | Discharge status: Home / Self Care | Payer: Self-pay | Source: Home / Self Care | Attending: Surgery

## 2021-06-24 ENCOUNTER — Ambulatory Visit (HOSPITAL_COMMUNITY): Payer: No Typology Code available for payment source

## 2021-06-24 ENCOUNTER — Ambulatory Visit (HOSPITAL_BASED_OUTPATIENT_CLINIC_OR_DEPARTMENT_OTHER)
Admission: RE | Admit: 2021-06-24 | Discharge: 2021-06-24 | Disposition: A | Discharge status: Home / Self Care | Payer: No Typology Code available for payment source | Attending: Surgery | Admitting: Surgery

## 2021-06-24 ENCOUNTER — Encounter: Payer: Self-pay | Admitting: Oncology

## 2021-06-24 ENCOUNTER — Other Ambulatory Visit: Payer: Self-pay

## 2021-06-24 ENCOUNTER — Other Ambulatory Visit (HOSPITAL_COMMUNITY): Payer: No Typology Code available for payment source

## 2021-06-24 ENCOUNTER — Ambulatory Visit (HOSPITAL_BASED_OUTPATIENT_CLINIC_OR_DEPARTMENT_OTHER): Payer: No Typology Code available for payment source | Admitting: Certified Registered"

## 2021-06-24 ENCOUNTER — Encounter: Payer: Self-pay | Admitting: *Deleted

## 2021-06-24 DIAGNOSIS — Z170 Estrogen receptor positive status [ER+]: Secondary | ICD-10-CM | POA: Insufficient documentation

## 2021-06-24 DIAGNOSIS — Z808 Family history of malignant neoplasm of other organs or systems: Secondary | ICD-10-CM | POA: Insufficient documentation

## 2021-06-24 DIAGNOSIS — C50411 Malignant neoplasm of upper-outer quadrant of right female breast: Secondary | ICD-10-CM | POA: Insufficient documentation

## 2021-06-24 DIAGNOSIS — Z803 Family history of malignant neoplasm of breast: Secondary | ICD-10-CM | POA: Insufficient documentation

## 2021-06-24 DIAGNOSIS — Z419 Encounter for procedure for purposes other than remedying health state, unspecified: Secondary | ICD-10-CM

## 2021-06-24 DIAGNOSIS — Z809 Family history of malignant neoplasm, unspecified: Secondary | ICD-10-CM | POA: Insufficient documentation

## 2021-06-24 DIAGNOSIS — Z452 Encounter for adjustment and management of vascular access device: Secondary | ICD-10-CM

## 2021-06-24 HISTORY — PX: PORTACATH PLACEMENT: SHX2246

## 2021-06-24 LAB — POCT PREGNANCY, URINE: Preg Test, Ur: NEGATIVE

## 2021-06-24 IMAGING — CR DG CHEST 1V PORT
1 series · 1 of 1 positions shown · non-contrast
Comparison: Portable exam [T0] hours compared to [DATE]

CLINICAL DATA: Post Port-A-Cath placement

EXAM:
PORTABLE CHEST 1 VIEW

[chest ap]
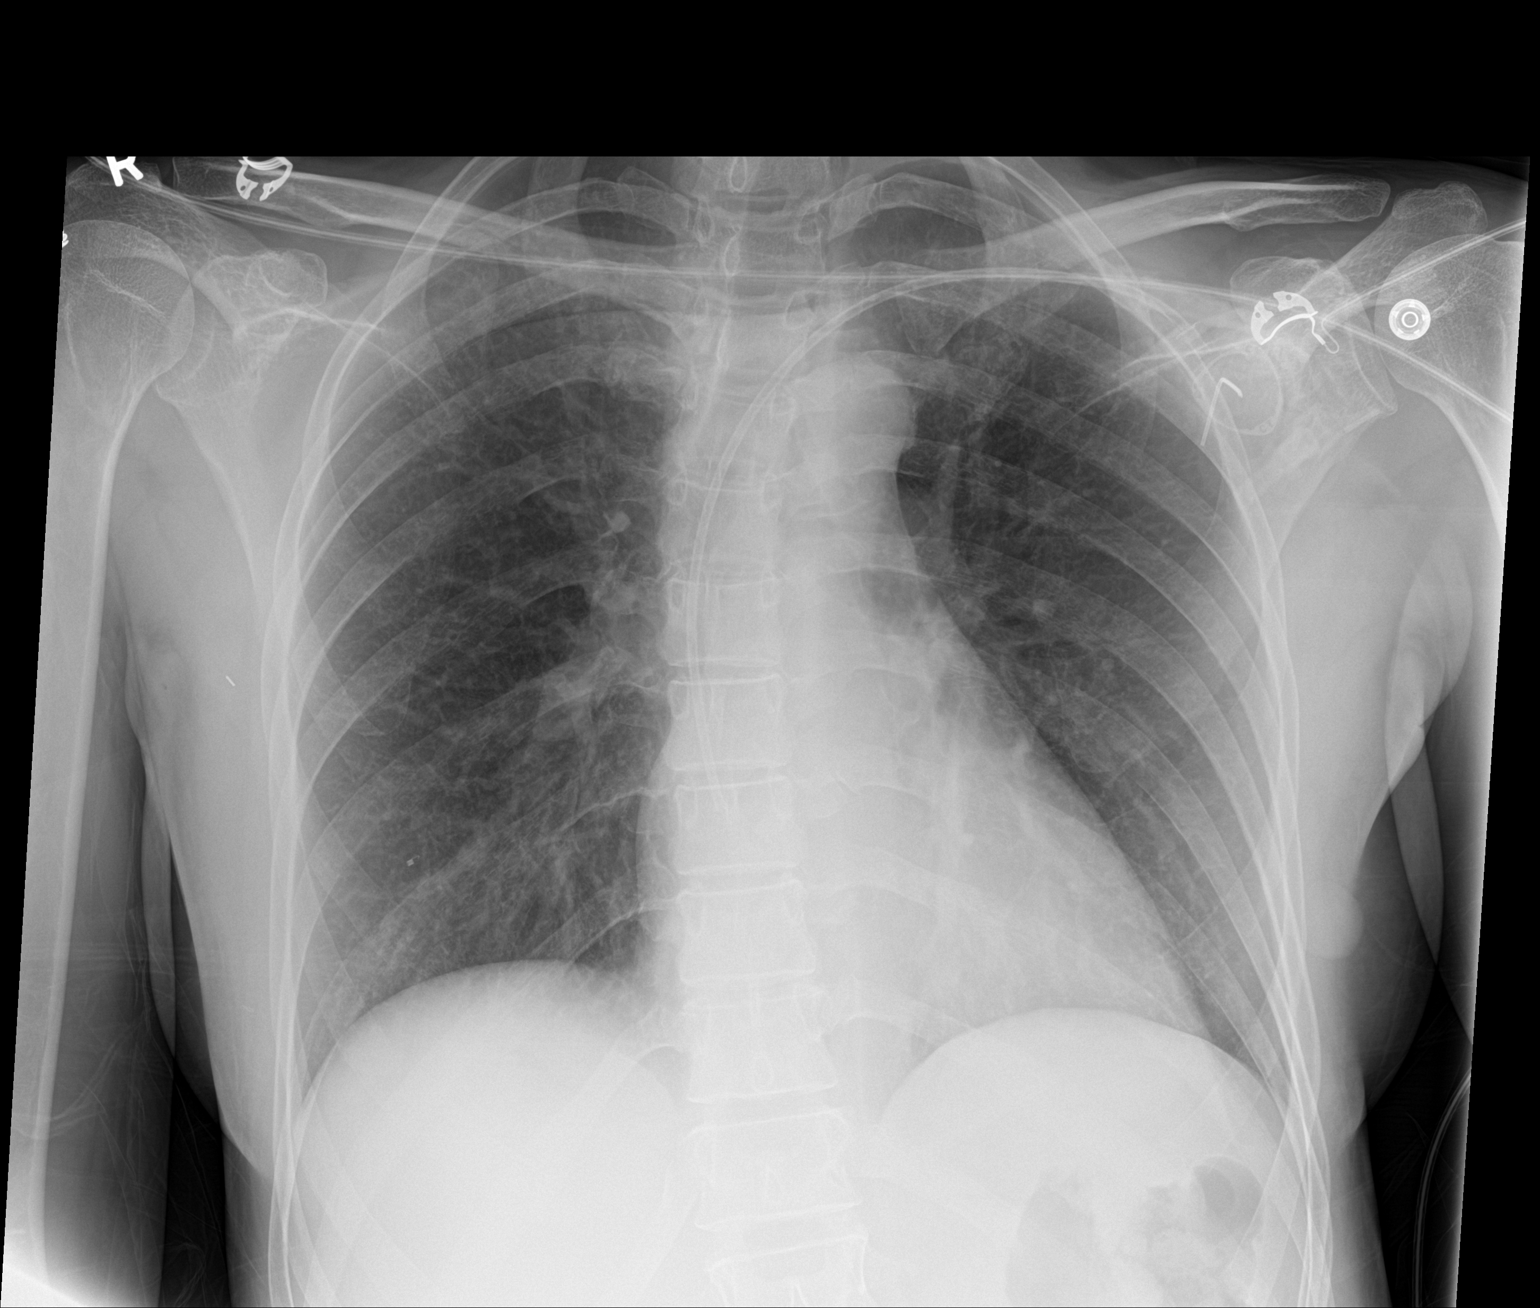

[1 of 1 positions shown; findings below may reference images not displayed]

FINDINGS: LEFT subclavian Port-A-Cath with tip projecting over SVC at/near
cavoatrial junction.

Normal heart size, mediastinal contours, and pulmonary vascularity.

Lungs clear.

No pulmonary infiltrate, pleural effusion, or pneumothorax.

Biopsy clip RIGHT breast with additional surgical clip at lateral
RIGHT breast.

No acute osseous findings.
IMPRESSION: LEFT subclavian Port-A-Cath without pneumothorax.

## 2021-06-24 SURGERY — INSERTION, TUNNELED CENTRAL VENOUS DEVICE, WITH PORT
Anesthesia: General | Site: Breast

## 2021-06-24 MED ORDER — ACETAMINOPHEN 500 MG PO TABS
ORAL_TABLET | ORAL | Status: AC
  Filled 2021-06-24: qty 2

## 2021-06-24 MED ORDER — HEPARIN (PORCINE) IN NACL 2-0.9 UNITS/ML
INTRAMUSCULAR | Status: AC | PRN
  Administered 2021-06-24: 500 mL via INTRAVENOUS

## 2021-06-24 MED ORDER — SODIUM BICARBONATE 4.2 % IV SOLN
INTRAVENOUS | Status: AC
  Filled 2021-06-24: qty 10

## 2021-06-24 MED ORDER — BUPIVACAINE-EPINEPHRINE 0.5% -1:200000 IJ SOLN
INTRAMUSCULAR | Status: DC | PRN
  Administered 2021-06-24: 10 mL

## 2021-06-24 MED ORDER — HEPARIN SOD (PORK) LOCK FLUSH 100 UNIT/ML IV SOLN
INTRAVENOUS | Status: AC
  Filled 2021-06-24: qty 5

## 2021-06-24 MED ORDER — FENTANYL CITRATE (PF) 100 MCG/2ML IJ SOLN
INTRAMUSCULAR | Status: DC | PRN
  Administered 2021-06-24 (×2): 50 ug via INTRAVENOUS

## 2021-06-24 MED ORDER — TRAMADOL HCL 50 MG PO TABS: 50.0000 mg | ORAL_TABLET | Freq: Four times a day (QID) | ORAL | 0 refills | Status: DC | PRN

## 2021-06-24 MED ORDER — OXYCODONE HCL 5 MG PO TABS: 5.0000 mg | ORAL_TABLET | Freq: Once | ORAL | Status: DC | PRN

## 2021-06-24 MED ORDER — DEXMEDETOMIDINE (PRECEDEX) IN NS 20 MCG/5ML (4 MCG/ML) IV SYRINGE
PREFILLED_SYRINGE | INTRAVENOUS | Status: AC
  Filled 2021-06-24: qty 10

## 2021-06-24 MED ORDER — HEPARIN SOD (PORK) LOCK FLUSH 100 UNIT/ML IV SOLN
INTRAVENOUS | Status: DC | PRN
  Administered 2021-06-24: 500 [IU] via INTRAVENOUS

## 2021-06-24 MED ORDER — CHLORHEXIDINE GLUCONATE CLOTH 2 % EX PADS: 6.0000 | MEDICATED_PAD | Freq: Once | CUTANEOUS | Status: DC

## 2021-06-24 MED ORDER — PROPOFOL 10 MG/ML IV BOLUS
INTRAVENOUS | Status: DC | PRN
  Administered 2021-06-24: 150 mg via INTRAVENOUS

## 2021-06-24 MED ORDER — AMISULPRIDE (ANTIEMETIC) 5 MG/2ML IV SOLN: 10.0000 mg | Freq: Once | INTRAVENOUS | Status: DC | PRN

## 2021-06-24 MED ORDER — HEPARIN (PORCINE) IN NACL 1000-0.9 UT/500ML-% IV SOLN
INTRAVENOUS | Status: AC
  Filled 2021-06-24: qty 500

## 2021-06-24 MED ORDER — CEFAZOLIN SODIUM-DEXTROSE 2-4 GM/100ML-% IV SOLN
INTRAVENOUS | Status: AC
  Filled 2021-06-24: qty 100

## 2021-06-24 MED ORDER — LIDOCAINE HCL (PF) 1 % IJ SOLN
INTRAMUSCULAR | Status: AC
  Filled 2021-06-24: qty 30

## 2021-06-24 MED ORDER — DEXAMETHASONE SODIUM PHOSPHATE 4 MG/ML IJ SOLN
INTRAMUSCULAR | Status: DC | PRN
  Administered 2021-06-24: 4 mg via INTRAVENOUS

## 2021-06-24 MED ORDER — ONDANSETRON HCL 4 MG/2ML IJ SOLN
INTRAMUSCULAR | Status: DC | PRN
  Administered 2021-06-24: 4 mg via INTRAVENOUS

## 2021-06-24 MED ORDER — OXYCODONE HCL 5 MG/5ML PO SOLN: 5.0000 mg | Freq: Once | ORAL | Status: DC | PRN

## 2021-06-24 MED ORDER — LIDOCAINE HCL (CARDIAC) PF 100 MG/5ML IV SOSY
PREFILLED_SYRINGE | INTRAVENOUS | Status: DC | PRN
  Administered 2021-06-24: 60 mg via INTRAVENOUS

## 2021-06-24 MED ORDER — LACTATED RINGERS IV SOLN: INTRAVENOUS | Status: DC

## 2021-06-24 MED ORDER — ACETAMINOPHEN 500 MG PO TABS
1000.0000 mg | ORAL_TABLET | ORAL | Status: AC
  Administered 2021-06-24: 1000 mg via ORAL

## 2021-06-24 MED ORDER — MIDAZOLAM HCL 5 MG/5ML IJ SOLN
INTRAMUSCULAR | Status: DC | PRN
  Administered 2021-06-24: 2 mg via INTRAVENOUS

## 2021-06-24 MED ORDER — FENTANYL CITRATE (PF) 100 MCG/2ML IJ SOLN
INTRAMUSCULAR | Status: AC
  Filled 2021-06-24: qty 2

## 2021-06-24 MED ORDER — PHENYLEPHRINE HCL (PRESSORS) 10 MG/ML IV SOLN
INTRAVENOUS | Status: DC | PRN
  Administered 2021-06-24: 80 ug via INTRAVENOUS

## 2021-06-24 MED ORDER — ONDANSETRON HCL 4 MG/2ML IJ SOLN: 4.0000 mg | Freq: Once | INTRAMUSCULAR | Status: DC | PRN

## 2021-06-24 MED ORDER — MIDAZOLAM HCL 2 MG/2ML IJ SOLN
INTRAMUSCULAR | Status: AC
  Filled 2021-06-24: qty 2

## 2021-06-24 MED ORDER — FENTANYL CITRATE (PF) 100 MCG/2ML IJ SOLN: 25.0000 ug | INTRAMUSCULAR | Status: DC | PRN

## 2021-06-24 MED ORDER — CEFAZOLIN SODIUM-DEXTROSE 2-4 GM/100ML-% IV SOLN
2.0000 g | INTRAVENOUS | Status: AC
  Administered 2021-06-24: 2 g via INTRAVENOUS

## 2021-06-24 MED FILL — Dexamethasone Sodium Phosphate Inj 100 MG/10ML: INTRAMUSCULAR | Qty: 1 | Status: AC

## 2021-06-24 MED FILL — Fosaprepitant Dimeglumine For IV Infusion 150 MG (Base Eq): INTRAVENOUS | Qty: 5 | Status: AC

## 2021-06-24 SURGICAL SUPPLY — 36 items
ADH SKN CLS APL DERMABOND .7 (GAUZE/BANDAGES/DRESSINGS) ×1
APL PRP STRL LF DISP 70% ISPRP (MISCELLANEOUS) ×1
BAG DECANTER FOR FLEXI CONT (MISCELLANEOUS) ×2 IMPLANT
BLADE SURG 15 STRL LF DISP TIS (BLADE) ×1 IMPLANT
BLADE SURG 15 STRL SS (BLADE) ×2
CANISTER SUCT 1200ML W/VALVE (MISCELLANEOUS) ×2 IMPLANT
CHLORAPREP W/TINT 26 (MISCELLANEOUS) ×2 IMPLANT
COVER BACK TABLE 60X90IN (DRAPES) ×2 IMPLANT
COVER MAYO STAND STRL (DRAPES) ×2 IMPLANT
DERMABOND ADVANCED (GAUZE/BANDAGES/DRESSINGS) ×1
DERMABOND ADVANCED .7 DNX12 (GAUZE/BANDAGES/DRESSINGS) ×1 IMPLANT
DRAPE C-ARM 42X72 X-RAY (DRAPES) ×2 IMPLANT
DRAPE LAPAROSCOPIC ABDOMINAL (DRAPES) ×2 IMPLANT
DRAPE UTILITY XL STRL (DRAPES) ×2 IMPLANT
ELECT REM PT RETURN 9FT ADLT (ELECTROSURGICAL) ×2
ELECTRODE REM PT RTRN 9FT ADLT (ELECTROSURGICAL) ×1 IMPLANT
GLOVE SURG SIGNA 7.5 PF LTX (GLOVE) ×2 IMPLANT
GOWN STRL REUS W/ TWL LRG LVL3 (GOWN DISPOSABLE) ×1 IMPLANT
GOWN STRL REUS W/ TWL XL LVL3 (GOWN DISPOSABLE) ×1 IMPLANT
GOWN STRL REUS W/TWL LRG LVL3 (GOWN DISPOSABLE) ×2
GOWN STRL REUS W/TWL XL LVL3 (GOWN DISPOSABLE) ×2
KIT PORT POWER 8FR ISP CVUE (Port) ×2 IMPLANT
NEEDLE HYPO 25X1 1.5 SAFETY (NEEDLE) ×2 IMPLANT
PACK BASIN DAY SURGERY FS (CUSTOM PROCEDURE TRAY) ×2 IMPLANT
PENCIL SMOKE EVACUATOR (MISCELLANEOUS) ×2 IMPLANT
SLEEVE SCD COMPRESS KNEE MED (STOCKING) ×2 IMPLANT
SUT MNCRL AB 4-0 PS2 18 (SUTURE) ×2 IMPLANT
SUT PROLENE 2 0 SH DA (SUTURE) ×2 IMPLANT
SUT SILK 2 0 TIES 17X18 (SUTURE)
SUT SILK 2-0 18XBRD TIE BLK (SUTURE) IMPLANT
SUT VIC AB 3-0 SH 27 (SUTURE) ×2
SUT VIC AB 3-0 SH 27X BRD (SUTURE) ×1 IMPLANT
SYR CONTROL 10ML LL (SYRINGE) ×2 IMPLANT
TOWEL GREEN STERILE FF (TOWEL DISPOSABLE) ×2 IMPLANT
TUBE CONNECTING 20X1/4 (TUBING) ×2 IMPLANT
YANKAUER SUCT BULB TIP NO VENT (SUCTIONS) ×2 IMPLANT

## 2021-06-24 NOTE — Progress Notes (Signed)
[place]  [phone] [phone]     ID: Ebony Lamb DOB: 1976-03-07  MR#: [id number]  BMS#:[id number]  Patient Care Team: Antony Contras, MD as PCP - General (Family Medicine) Coralie Keens, MD as Consulting Physician (General Surgery) Daylyn Azbill, Virgie Dad, MD as Consulting Physician (Oncology) Eppie Gibson, MD as Attending Physician (Radiation Oncology) Rockwell Germany, RN as Oncology Nurse Navigator Mauro Kaufmann, RN as Oncology Nurse Navigator Rozetta Nunnery, MD as Consulting Physician (Otolaryngology) Sanjuana Kava, MD as Referring Physician (Obstetrics and Gynecology) Chauncey Cruel, MD OTHER MD:  CHIEF COMPLAINT: Weakly estrogen receptor positive breast cancer  CURRENT TREATMENT: Neoadjuvant chemotherapy   INTERVAL HISTORY: Ebony Lamb returns today for follow up and treatment of her weakly estrogen receptor positive breast cancer. She was evaluated in the multidisciplinary breast cancer clinic on 06/10/2021.  She underwent genetic testing during clinic. The first set of results was negative. Additional results are still pending.  Since consultation, she underwent breast MRI on 06/15/2021 showing: breast composition D; 3.1 cm mass in upper-inner right breast consistent with known malignancy; left breast and lymph nodes are negative.  She also underwent staging chest CT and bone scan on 06/22/2021. Chest CT showed: 2.6 cm right breast lesion corresponding to known carcinoma; tiny pulmonary nodules in right middle lobe measuring 4 mm or less in size, highly nonspecific and likely benign.  Bone scan showed: increased activity noted over both SI joints, possibly representing sacroiliitis; questionable increased activity noted about lower lumbar/sacrum; no other bony abnormalities.  Echo 06/22/2021 showed an ejection fraction in the 55-60% range.  She had port placement yesterday in anticipation of beginning neoadjuvant chemo today.  She  also had a pregnancy test yesterday which was negative   REVIEW OF SYSTEMS: Ebony Lamb had many questions regarding her tests and we discussed that today.  Overall however she is very ready to start her treatments.   COVID 19 VACCINATION STATUS: Pfizer x3; infection 01/2021   HISTORY OF CURRENT ILLNESS: From the original intake note:  Ebony Lamb (pronounced "REECE") has a history of previous right breast biopsy (8 o'clock) in 07/2019 that was negative.  More recently she presented with a palpable right breast mass at 12 o'clock. She underwent right diagnostic mammography with tomography and right breast ultrasonography at [facility] on 06/01/2021 showing: breast density category D; palpable 3 cm irregular mass in right breast at 12 o'clock; possible 5 mm satellite lesion located 2.5 cm from dominant mass; abnormal right axillary lymph node.  Accordingly on 06/04/2021 she proceeded to biopsy of the right breast area in question. The pathology from this procedure ([id number]) showed: invasive ductal carcinoma, grade 3. Prognostic indicators significant for: estrogen receptor, 15% positive and progesterone receptor, 15% positive, both with weak-moderate staining intensity. Proliferation marker Ki67 at 70%. HER2 equivocal by immunohistochemistry (2+), but negative by fluorescent in situ hybridization with a signals ratio 1.47 and number per cell 2.50.  Biopsy of the questionable lymph node was negative and concordant  Cancer Staging Malignant neoplasm of upper-outer quadrant of right breast in female, estrogen receptor positive ([place]) Staging form: Breast, AJCC 8th Edition - Clinical stage from 06/10/2021: Stage IIA (cT2, cN0, cM0, G3, ER+, PR+, HER2-) - Signed by Chauncey Cruel, MD on 06/10/2021 Stage prefix: Initial diagnosis Histologic grading system: 3 grade system Laterality: Right Staged by: Pathologist and managing physician Stage used in treatment planning: Yes National guidelines used  in treatment planning: Yes Type of national guideline used in treatment  planning: NCCN  The patient's subsequent history is as detailed below.   PAST MEDICAL HISTORY: Past Medical History:  Diagnosis Date   Abnormal Pap smear 09/13/2000   Allergy 02/2020   Seasonal   Cancer ([place])    Depression    history of depression- denies currently   H/O varicella    Hypertension 09/14/2011   post partum- meds x 1 mo   No pertinent past medical history    Preterm labor     PAST SURGICAL HISTORY: Past Surgical History:  Procedure Laterality Date   BREAST BIOPSY Right 07/27/2019   CESAREAN SECTION  11/14/2011   Procedure: CESAREAN SECTION;  Surgeon: Eli Hose, MD;  Location: [facility];  Service: Gynecology;  Laterality: N/A;  Primary cesarean section with delivery of baby boy at [phone]. Apgars 9/9.   CESAREAN SECTION N/A 03/22/2014   Procedure: Repeat CESAREAN SECTION;  Surgeon: Alwyn Pea, MD;  Location: [facility];  Service: Obstetrics;  Laterality: N/A;   COSMETIC SURGERY  11/22/2017   Abdominoplasty   FOOT SURGERY     FRACTURE SURGERY  08/1999   Repair of 5th metatarsal in left foot   HERNIA REPAIR  11/22/2017   TUBAL LIGATION  11/22/2017   Tubes were completely removed   WISDOM TOOTH EXTRACTION      FAMILY HISTORY: Family History  Problem Relation Age of Onset   Breast cancer Mother 22   Osteopenia Mother    Arthritis Mother    Other Mother        BRCA2 gene mutation   Hypertension Father    Kidney disease Father    Depression Father    Hyperlipidemia Father    Melanoma Father 22   Hypertension Brother    Hyperlipidemia Brother    Breast cancer Maternal Grandmother        dx. late 30s/40s   Heart disease Maternal Grandfather    Other Maternal Grandfather        BRCA2 gene mutation   Ovarian cancer Paternal Grandmother        dx. 48s   Stroke Paternal Grandfather    Birth defects Son    Anesthesia problems Neg Hx    Diabetes Neg Hx    Her parents are both  living, her father age 40 and her mother age 62 as of 05/2021. Ebony Lamb has one brother (and no sisters). She reports breast cancer in her mother at age 31 and her maternal grandmother in her late 37's, melanoma in her father at age 43, and ovarian cancer in her paternal grandmother in her 74's.   GYNECOLOGIC HISTORY:  Patient's last menstrual period was 06/06/2021. Menarche: 45 years old Age at first live birth: 45 years old Boley P 2 LMP 06/06/2021 Contraceptive: used from 1996-2003 and 2006-2012 HRT n/a  Hysterectomy? no BSO? 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 Husband Ebony Lamb work for [employer] in [employer]. She lives at home with Ebony Lamb and their two children-- Ebony Lamb, age 5, and Ebony Lamb, age 77. 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    ADVANCED DIRECTIVES: in place   HEALTH MAINTENANCE: Social History   Tobacco Use   Smoking status: Never   Smokeless tobacco: Never  Vaping Use   Vaping Use: Never used  Substance Use Topics   Alcohol use: Yes    Alcohol/week: 7.0 standard drinks    Types: 7 Standard drinks or equivalent per week  Comment: I stopped drinking alcohol after finding the lump in my brea   Drug use: No     Colonoscopy: never done (age)  PAP: 06/2020  Bone density: never done (age)   No Known Allergies  Current Outpatient Medications  Medication Sig Dispense Refill   dexamethasone (DECADRON) 4 MG tablet Take 2 tablets (8 mg total) by mouth daily. Take daily for 3 days after chemo. Take with food. 30 tablet 1   lidocaine-prilocaine (EMLA) cream Apply to affected area once 30 g 3   loratadine (CLARITIN) 10 MG tablet Take 1 tablet (10 mg total) by mouth daily. Take day post chemo and then daily for 10 days with each treatment 30 tablet 3   LORazepam (ATIVAN) 0.5 MG tablet Take 1 tablet (0.5 mg total) by mouth at bedtime as needed (Nausea or vomiting). 30 tablet 0   prochlorperazine  (COMPAZINE) 10 MG tablet Take 1 tablet (10 mg total) by mouth every 6 (six) hours as needed (Nausea or vomiting). 30 tablet 1   traMADol (ULTRAM) 50 MG tablet Take 1-2 tablets (50-100 mg total) by mouth every 6 (six) hours as needed. 20 tablet 0   No current facility-administered medications for this visit.   Facility-Administered Medications Ordered in Other Visits  Medication Dose Route Frequency Provider Last Rate Last Admin   influenza vac split quadrivalent PF (FLUARIX) 0.5 ML injection            influenza vac split quadrivalent PF (FLUARIX) injection 0.5 mL  0.5 mL Intramuscular Once Damarko Stitely, Virgie Dad, MD        OBJECTIVE: White woman who appears stated age  Vitals:   06/25/21 0811  BP: 132/81  Pulse: 76  Resp: 16  Temp: 97.7 F (36.5 C)  SpO2: 99%      Body mass index is 20.35 kg/m.   Wt Readings from Last 3 Encounters:  06/25/21 126 lb 1.6 oz (57.2 kg)  06/24/21 123 lb 10.9 oz (56.1 kg)  06/10/21 125 lb (56.7 kg)     ECOG FS:1 - Symptomatic but completely ambulatory  Sclerae unicteric, EOMs intact Wearing a mask No cervical or supraclavicular adenopathy Lungs no rales or rhonchi Heart regular rate and rhythm Abd soft, nontender, positive bowel sounds MSK no focal spinal tenderness, no upper extremity lymphedema Neuro: nonfocal, well oriented, appropriate affect Breasts: Deferred   LAB RESULTS:  CMP     Component Value Date/Time   NA 141 06/10/2021 0838   K 4.3 06/10/2021 0838   CL 107 06/10/2021 0838   CO2 24 06/10/2021 0838   GLUCOSE 87 06/10/2021 0838   BUN 9 06/10/2021 0838   CREATININE 0.79 06/10/2021 0838   CALCIUM 9.6 06/10/2021 0838   PROT 7.3 06/10/2021 0838   ALBUMIN 4.4 06/10/2021 0838   AST 18 06/10/2021 0838   ALT 15 06/10/2021 0838   ALKPHOS 49 06/10/2021 0838   BILITOT 0.8 06/10/2021 0838   GFRNONAA >60 06/10/2021 0838   GFRAA >90 11/26/2011 2008    No results found for: TOTALPROTELP, ALBUMINELP, A1GS, A2GS, BETS, BETA2SER,  GAMS, MSPIKE, SPEI  Lab Results  Component Value Date   WBC 9.7 06/25/2021   NEUTROABS 5.0 06/25/2021   HGB 12.8 06/25/2021   HCT 38.2 06/25/2021   MCV 91.0 06/25/2021   PLT 280 06/25/2021    No results found for: LABCA2  No components found for: ULAGTX646  No results for input(s): INR in the last 168 hours.  No results found for: LABCA2  No results found  for: KXF818  No results found for: EXH371  No results found for: IRC789  No results found for: CA2729  No components found for: HGQUANT  No results found for: CEA1 / No results found for: CEA1   No results found for: AFPTUMOR  No results found for: CHROMOGRNA  No results found for: KPAFRELGTCHN, LAMBDASER, KAPLAMBRATIO (kappa/lambda light chains)  No results found for: HGBA, HGBA2QUANT, HGBFQUANT, HGBSQUAN (Hemoglobinopathy evaluation)   Lab Results  Component Value Date   LDH 223 11/26/2011    No results found for: IRON, TIBC, IRONPCTSAT (Iron and TIBC)  No results found for: FERRITIN  Urinalysis    Component Value Date/Time   COLORURINE YELLOW 11/26/2011 2000   APPEARANCEUR CLEAR 11/26/2011 2000   LABSPEC <1.005 (L) 11/26/2011 2000   PHURINE 6.5 11/26/2011 2000   GLUCOSEU NEGATIVE 11/26/2011 2000   HGBUR SMALL (A) 11/26/2011 2000   BILIRUBINUR NEGATIVE 11/26/2011 2000   KETONESUR NEGATIVE 11/26/2011 2000   PROTEINUR NEGATIVE 11/26/2011 2000   UROBILINOGEN 0.2 11/26/2011 2000   NITRITE NEGATIVE 11/26/2011 2000   LEUKOCYTESUR MODERATE (A) 11/26/2011 2000    STUDIES: CT Chest W Contrast  Result Date: 06/23/2021 CLINICAL DATA:  45 year old female with history of right-sided breast cancer. Staging examination. New onset of back pain. EXAM: CT CHEST WITH CONTRAST TECHNIQUE: Multidetector CT imaging of the chest was performed during intravenous contrast administration. CONTRAST:  37m OMNIPAQUE IOHEXOL 350 MG/ML SOLN COMPARISON:  Chest CTA 12/07/2017. FINDINGS: Cardiovascular: Heart size is normal.  There is no significant pericardial fluid, thickening or pericardial calcification. No atherosclerotic calcifications are noted in the thoracic aorta or the coronary arteries. Mediastinum/Nodes: No pathologically enlarged mediastinal, internal mammary or hilar lymph nodes. Esophagus is unremarkable in appearance. No axillary lymphadenopathy. Lungs/Pleura: 4 mm right middle lobe pulmonary nodule (axial image 88 of series 5). 2 mm right middle lobe pulmonary nodule (axial image 130 of series 5). No other larger more suspicious appearing pulmonary nodules or masses are noted. No acute consolidative airspace disease. No pleural effusions. Upper Abdomen: Unremarkable. Musculoskeletal: In the superomedial aspect of the right breast there is a soft tissue attenuation lesion which demonstrates peripheral enhancement and some central calcification best appreciated on axial image 74 of series 2 and coronal image 6 of series 6 measuring 2.6 x 2.0 x 2.5 cm. This lesion is intimately associated with the underlying pectoralis musculature, without definite direct invasion at this time. There are no aggressive appearing lytic or blastic lesions noted in the visualized portions of the skeleton. IMPRESSION: 1. 2.6 x 2.0 x 2.5 cm peripherally enhancing partially calcified lesion in the superomedial aspect of the right breast corresponding to known right breast carcinoma. 2. Tiny pulmonary nodules in the right middle lobe measuring 4 mm or less in size, highly nonspecific and statistically likely benign. Attention on routine follow-up imaging is recommended to ensure the stability or resolution of these findings. No definitive signs of metastatic disease in the thorax are noted on today's examination. Electronically Signed   By: DVinnie LangtonM.D.   On: 06/23/2021 06:23   NM Bone Scan Whole Body  Result Date: 06/24/2021 CLINICAL DATA:  History of breast cancer. EXAM: NUCLEAR MEDICINE WHOLE BODY BONE SCAN TECHNIQUE: Whole body  anterior and posterior images were obtained approximately 3 hours after intravenous injection of radiopharmaceutical. RADIOPHARMACEUTICALS:  21.3 mCi Technetium-960mDP IV COMPARISON:  CT 06/22/2021. FINDINGS: Bilateral renal function and excretion. Increased activity noted over both SI joints. Sacroiliitis could present in this fashion. Questionable increased activity noted about  the lower lumbar spine/sacrum. AP view of the pelvis suggested for further evaluation. No other bony abnormalities identified. IMPRESSION: 1. Increased activity noted over the best SI joints. Sacroiliitis could present in this fashion. Questionable increased activity noted about the lower lumbar spine/sacrum. AP view of the pelvis suggested for further evaluation. 2.  No other bony abnormalities identified. Electronically Signed   By: Marcello Moores  Register M.D.   On: 06/24/2021 09:03   MR BREAST BILATERAL W WO CONTRAST INC CAD  Addendum Date: 06/16/2021   ADDENDUM REPORT: 06/16/2021 16:02 ADDENDUM: Please note corrections to report, below. Right breast: Within the UPPER INNER QUADRANT of the RIGHT breast there is a rim enhancing mass measuring 2.6 x 2.2 x 3.1 centimeters. Electronically Signed   By: Nolon Nations M.D.   On: 06/16/2021 16:02   Result Date: 06/16/2021 CLINICAL DATA:  Staging for new breast cancer diagnosis. Recent ultrasound-guided core biopsy of mass in the 12 o'clock location of the RIGHT breast shows grade 3 invasive ductal carcinoma. Biopsy of RIGHT axillary lymph node shows concordant benign lymph node tissue, negative for metastatic disease. LABS:  None obtained at the time of imaging. EXAM: BILATERAL BREAST MRI WITH AND WITHOUT CONTRAST TECHNIQUE: Multiplanar, multisequence MR images of both breasts were obtained prior to and following the intravenous administration of 5 ml of Gadavist Three-dimensional MR images were rendered by post-processing of the original MR data on an independent workstation. The  three-dimensional MR images were interpreted, and findings are reported in the following complete MRI report for this study. Three dimensional images were evaluated at the independent interpreting workstation using the DynaCAD thin client. COMPARISON:  Previous exam(s). FINDINGS: Breast composition: d. Extreme fibroglandular tissue. Background parenchymal enhancement: Minimal Right breast: Within the UPPER INNER QUADRANT of the RIGHT breast there is a rib enhancing mass measuring 2.6 x 2.2 x 3.1 centimeters. Within the central aspect of the mass, tissue marker clip is consistent with recent core biopsy which shows grade 3 invasive ductal carcinoma. The size on MR and ultrasound are similar. Suspected satellite nodule seen on ultrasound is not well seen on MRI. Left breast: No mass or abnormal enhancement. Lymph nodes: No abnormal appearing lymph nodes. Tissue marker clip is identified within a RIGHT axillary lymph node on image 7 of series 2, marking the site of recently biopsied benign concordant lymph node. Ancillary findings:  None. IMPRESSION: 1. 3.1 centimeter mass in the UPPER INNER QUADRANT of the RIGHT breast consistent with known grade 3 invasive ductal carcinoma. 2. LEFT breast is negative. RECOMMENDATION: Recommend treatment plan for known RIGHT breast malignancy. BI-RADS CATEGORY  6: Known biopsy-proven malignancy. Electronically Signed: By: Nolon Nations M.D. On: 06/15/2021 13:00  DG CHEST PORT 1 VIEW  Result Date: 06/24/2021 CLINICAL DATA:  Post Port-A-Cath placement EXAM: PORTABLE CHEST 1 VIEW COMPARISON:  Portable exam 1248 hours compared to 01/03/2013 FINDINGS: LEFT subclavian Port-A-Cath with tip projecting over SVC at/near cavoatrial junction. Normal heart size, mediastinal contours, and pulmonary vascularity. Lungs clear. No pulmonary infiltrate, pleural effusion, or pneumothorax. Biopsy clip RIGHT breast with additional surgical clip at lateral RIGHT breast. No acute osseous findings.  IMPRESSION: LEFT subclavian Port-A-Cath without pneumothorax. Electronically Signed   By: Lavonia Dana M.D.   On: 06/24/2021 13:57   DG Fluoro Guide CV Line-No Report  Result Date: 06/24/2021 Fluoroscopy was utilized by the requesting physician.  No radiographic interpretation.   US BREAST LTD UNI RIGHT INC AXILLA  Result Date: 06/01/2021 CLINICAL DATA:  Palpable lump in the right breast.  EXAM: DIGITAL DIAGNOSTIC UNILATERAL RIGHT MAMMOGRAM WITH TOMOSYNTHESIS AND CAD; ULTRASOUND RIGHT BREAST LIMITED TECHNIQUE: Right digital diagnostic mammography and breast tomosynthesis was performed. The images were evaluated with computer-aided detection.; Targeted ultrasound examination of the right breast was performed COMPARISON:  Previous exam(s). ACR Breast Density Category d: The breast tissue is extremely dense, which lowers the sensitivity of mammography. FINDINGS: There is a mass in the medial superior right breast at the site of the patient's palpable lump. No other suspicious mammographic findings on the right On physical exam, there is a lump in the right breast at 12 o'clock. Targeted ultrasound is performed, showing an irregular mass in the right breast at 12 o'clock, 2 cm from the nipple measuring 3.0 x 1.6 x 2.7 cm. There is a small adjacent mass measuring 5 x 2 x 5 mm 3.5 mm from the dominant mass. There is a single abnormal lymph node in the right axilla with a cortex measuring nearly 5 mm. IMPRESSION: 1. Suspicious mass in the right breast.  Possible satellite lesion. 2. Abnormal lymph node in the right axilla. RECOMMENDATION: Recommend ultrasound-guided biopsy of the dominant 12 o'clock right breast mass and the adjacent satellite lesion. These masses can be biopsied with 1 biopsy. Recommend placing a biopsy clip within the right breast mass and the adjacent satellite lesion. Recommend ultrasound-guided biopsy of the abnormal right axillary lymph node. If the biopsy demonstrates malignancy, strongly  recommend breast MRI due to breast density and patient's family history. The patient's mother was diagnosed with breast cancer in her 62s. Her mother was diagnosed with the BRCA2 gene. However, it is thought the BRCA2 gene was inherited from her mother's father. The patient's maternal grandmother was diagnosed with breast cancer in her 85s. The patient has been genetically tested and was negative. She was tested in approximally 2015. The patient does not know her lifetime risk of breast cancer. Recommend determining whether the patient received panel testing. I suspect she received a panel test if she was indeed tested in 2015. If the patient did not receive a panel test, recommend sending the patient to genetic counseling for a new test. If the patient did receive a panel test, recommend assessing the patient's lifetime risk of breast cancer with the [place]. If the patient is genetically positive or has a lifetime risk of greater than 20%, recommend annual breast MRI. Also, if the patient is high risk, recommend assessing the patient with the [place] to determine whether she would benefit from antiestrogen therapy. I have discussed the findings and recommendations with the patient. If applicable, a reminder letter will be sent to the patient regarding the next appointment. BI-RADS CATEGORY  4: Suspicious. Electronically Signed   By: Dorise Bullion III M.D.   On: 06/01/2021 16:32  MM DIAG BREAST TOMO UNI LEFT  Result Date: 06/08/2021 CLINICAL DATA:  The patient reports that she had a red, flaky area on the skin in the medial periareolar left breast beginning in April 2022 that subsequently resolved 1 month ago. She currently has no left breast problems. Recently diagnosed malignancy in the right breast. EXAM: DIGITAL DIAGNOSTIC UNILATERAL LEFT MAMMOGRAM WITH TOMOSYNTHESIS AND CAD TECHNIQUE: Left digital diagnostic mammography and breast tomosynthesis was performed. The images were evaluated with  computer-aided detection. COMPARISON:  Previous exam(s). ACR Breast Density Category d: The breast tissue is extremely dense, which lowers the sensitivity of mammography. FINDINGS: Stable mammographic appearance of the left breast with no findings suspicious for malignancy. On physical examination, the  medial periareolar left breast skin has a normal appearance at the location of the previous redness and flaking with no palpable abnormality. IMPRESSION: No evidence of malignancy. RECOMMENDATION: Treatment plan for the patient's recently diagnosed right breast cancer. I have discussed the findings and recommendations with the patient. If applicable, a reminder letter will be sent to the patient regarding the next appointment. BI-RADS CATEGORY  1: Negative. Electronically Signed   By: Claudie Revering M.D.   On: 06/08/2021 09:08  MM DIAG BREAST TOMO UNI RIGHT  Result Date: 06/01/2021 CLINICAL DATA:  Palpable lump in the right breast. EXAM: DIGITAL DIAGNOSTIC UNILATERAL RIGHT MAMMOGRAM WITH TOMOSYNTHESIS AND CAD; ULTRASOUND RIGHT BREAST LIMITED TECHNIQUE: Right digital diagnostic mammography and breast tomosynthesis was performed. The images were evaluated with computer-aided detection.; Targeted ultrasound examination of the right breast was performed COMPARISON:  Previous exam(s). ACR Breast Density Category d: The breast tissue is extremely dense, which lowers the sensitivity of mammography. FINDINGS: There is a mass in the medial superior right breast at the site of the patient's palpable lump. No other suspicious mammographic findings on the right On physical exam, there is a lump in the right breast at 12 o'clock. Targeted ultrasound is performed, showing an irregular mass in the right breast at 12 o'clock, 2 cm from the nipple measuring 3.0 x 1.6 x 2.7 cm. There is a small adjacent mass measuring 5 x 2 x 5 mm 3.5 mm from the dominant mass. There is a single abnormal lymph node in the right axilla with a cortex  measuring nearly 5 mm. IMPRESSION: 1. Suspicious mass in the right breast.  Possible satellite lesion. 2. Abnormal lymph node in the right axilla. RECOMMENDATION: Recommend ultrasound-guided biopsy of the dominant 12 o'clock right breast mass and the adjacent satellite lesion. These masses can be biopsied with 1 biopsy. Recommend placing a biopsy clip within the right breast mass and the adjacent satellite lesion. Recommend ultrasound-guided biopsy of the abnormal right axillary lymph node. If the biopsy demonstrates malignancy, strongly recommend breast MRI due to breast density and patient's family history. The patient's mother was diagnosed with breast cancer in her 61s. Her mother was diagnosed with the BRCA2 gene. However, it is thought the BRCA2 gene was inherited from her mother's father. The patient's maternal grandmother was diagnosed with breast cancer in her 29s. The patient has been genetically tested and was negative. She was tested in approximally 2015. The patient does not know her lifetime risk of breast cancer. Recommend determining whether the patient received panel testing. I suspect she received a panel test if she was indeed tested in 2015. If the patient did not receive a panel test, recommend sending the patient to genetic counseling for a new test. If the patient did receive a panel test, recommend assessing the patient's lifetime risk of breast cancer with the [place]. If the patient is genetically positive or has a lifetime risk of greater than 20%, recommend annual breast MRI. Also, if the patient is high risk, recommend assessing the patient with the [place] to determine whether she would benefit from antiestrogen therapy. I have discussed the findings and recommendations with the patient. If applicable, a reminder letter will be sent to the patient regarding the next appointment. BI-RADS CATEGORY  4: Suspicious. Electronically Signed   By: Dorise Bullion III M.D.   On:  06/01/2021 16:32  ECHOCARDIOGRAM COMPLETE  Result Date: 06/22/2021    ECHOCARDIOGRAM REPORT   Patient Name:   Ebony Lamb  Date of Exam: 06/22/2021 Medical Rec #:  [id number]   Height:       66.0 in Accession #:    [id number]  Weight:       125.0 lb Date of Birth:  1975-09-27    BSA:          1.638 m Patient Age:    45 years    BP:           146/90 mmHg Patient Gender: F           HR:           72 bpm. Exam Location:  Outpatient Procedure: 2D Echo, 3D Echo, Cardiac Doppler, Color Doppler and Strain Analysis Indications:    Chemo Z09  History:        Patient has no prior history of Echocardiogram examinations.                 Risk Factors:Hypertension.  Sonographer:    Darlina Sicilian RDCS Referring Phys: [place]  1. Left ventricular ejection fraction, by estimation, is 55 to 60%. The left ventricle has normal function. The left ventricle has no regional wall motion abnormalities. Left ventricular diastolic parameters were normal. The average left ventricular global longitudinal strain is -23.0 %. The global longitudinal strain is normal.  2. Right ventricular systolic function is normal. The right ventricular size is normal.  3. The mitral valve is normal in structure. No evidence of mitral valve regurgitation. No evidence of mitral stenosis.  4. The aortic valve is tricuspid. Aortic valve regurgitation is not visualized. No aortic stenosis is present.  5. The inferior vena cava is normal in size with greater than 50% respiratory variability, suggesting right atrial pressure of 3 mmHg. FINDINGS  Left Ventricle: Left ventricular ejection fraction, by estimation, is 55 to 60%. The left ventricle has normal function. The left ventricle has no regional wall motion abnormalities. The average left ventricular global longitudinal strain is -23.0 %. The global longitudinal strain is normal. The left ventricular internal cavity size was normal in size. There is no left ventricular hypertrophy.  Left ventricular diastolic parameters were normal. Right Ventricle: The right ventricular size is normal. No increase in right ventricular wall thickness. Right ventricular systolic function is normal. Left Atrium: Left atrial size was normal in size. Right Atrium: Right atrial size was normal in size. Pericardium: There is no evidence of pericardial effusion. Mitral Valve: The mitral valve is normal in structure. No evidence of mitral valve regurgitation. No evidence of mitral valve stenosis. Tricuspid Valve: The tricuspid valve is normal in structure. Tricuspid valve regurgitation is not demonstrated. No evidence of tricuspid stenosis. Aortic Valve: The aortic valve is tricuspid. Aortic valve regurgitation is not visualized. No aortic stenosis is present. Pulmonic Valve: The pulmonic valve was normal in structure. Pulmonic valve regurgitation is not visualized. No evidence of pulmonic stenosis. Aorta: The aortic root is normal in size and structure. Venous: The inferior vena cava is normal in size with greater than 50% respiratory variability, suggesting right atrial pressure of 3 mmHg. IAS/Shunts: No atrial level shunt detected by color flow Doppler.  LEFT VENTRICLE PLAX 2D LVIDd:         4.50 cm   Diastology LVIDs:         3.55 cm   LV e' medial:    9.52 cm/s LV PW:         0.70 cm   LV E/e' medial:  5.3 LV IVS:  0.80 cm   LV e' lateral:   14.00 cm/s LVOT diam:     2.00 cm   LV E/e' lateral: 3.6 LV SV:         57 LV SV Index:   35        2D Longitudinal Strain LVOT Area:     3.14 cm  2D Strain GLS Avg:     -23.0 %  RIGHT VENTRICLE RV S prime:     12.50 cm/s TAPSE (M-mode): 2.0 cm LEFT ATRIUM             Index        RIGHT ATRIUM          Index LA diam:        3.00 cm 1.83 cm/m   RA Area:     7.44 cm LA Vol (A2C):   17.8 ml 10.87 ml/m  RA Volume:   13.00 ml 7.94 ml/m LA Vol (A4C):   13.7 ml 8.37 ml/m LA Biplane Vol: 16.6 ml 10.14 ml/m  AORTIC VALVE LVOT Vmax:   102.00 cm/s LVOT Vmean:  66.300 cm/s  LVOT VTI:    0.180 m  AORTA Ao Root diam: 2.90 cm Ao Asc diam:  2.90 cm MITRAL VALVE MV Area (PHT): 3.46 cm    SHUNTS MV Decel Time: 219 msec    Systemic VTI:  0.18 m MV E velocity: 50.00 cm/s  Systemic Diam: 2.00 cm MV A velocity: 40.40 cm/s MV E/A ratio:  1.24 Jenkins Rouge MD Electronically signed by Jenkins Rouge MD Signature Date/Time: 06/22/2021/10:28:32 AM    Final    [name] AXILLARY NODE CORE BIOPSY RIGHT  Addendum Date: 06/05/2021   ADDENDUM REPORT: 06/05/2021 13:25 ADDENDUM: 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 This was found to be concordant by Dr. Nolon Nations. Following biopsy, a heart shaped clip was deployed into the small satellite lesion along the superolateral aspect of the dominant mass. This lesion was not sampled individually. Pathology revealed LYMPH NODE TISSUE NEGATIVE FOR METASTATIC CARCINOMA of the RIGHT axilla, tribell clip. This was found to be concordant by Dr. Nolon Nations. Pathology results were discussed with the patient by telephone. The patient reported doing well after the biopsies with tenderness at the sites. Post biopsy instructions and care were reviewed and questions were answered. The patient was encouraged to call [facility] for any additional concerns. The patient was referred to [facility] on June 10, 2021. Breast MRI recommended for extent of disease. Pathology results reported by Stacie Acres RN on 06/05/2021. Electronically Signed   By: Nolon Nations M.D.   On: 06/05/2021 13:25   Result Date: 06/05/2021 CLINICAL DATA:  Patient presents for ultrasound-guided core biopsy of RIGHT breast mass and enlarged RIGHT axillary lymph node. EXAM: [name] AXILLARY NODE CORE BIOPSY ULTRASOUND RIGHT BREAST CORE NEEDLE BIOPSY COMPARISON:  Previous exam(s). PROCEDURE: I met with the patient and we discussed the  procedure of ultrasound-guided biopsy, including benefits and alternatives. We discussed the high likelihood of a successful procedure. We discussed the risks of the procedure, including infection, bleeding, tissue injury, clip migration, and inadequate sampling. Informed written consent was given. The usual time-out protocol was performed immediately prior to the procedure. Prior to the biopsy, ultrasound was performed for planning purposes. During this exam, a small oval mass with indistinct margins is identified in the 10 o'clock location of  the RIGHT breast 5 centimeters from the nipple measuring 0.5 x 0.3 x 0.5 centimeters. Site 1: RIGHT breast 12 o'clock. Lesion quadrant: 12 o'clock RIGHT breast, coil clip Using sterile technique and 1% lidocaine and 1% lidocaine with epinephrine as local anesthetic, under direct ultrasound visualization, a 12 gauge spring-loaded device was used to perform biopsy of mass in the 12 o'clock location of the RIGHT breast using a MEDIAL approach. At the conclusion of the procedure coil shaped tissue marker clip was deployed into the biopsy cavity. Following biopsy, a heart shaped clip was deployed into the small satellite lesion along the superolateral aspect of the dominant mass. This lesion was not sampled individually. Site 2: RIGHT axilla.  Lesion quadrant: RIGHT axilla, Tribell clip Using sterile technique and 1% lidocaine as local anesthetic, under direct ultrasound visualization, a 14 gauge spring-loaded device was used to perform biopsy of enlarged RIGHT axillary lymph node using a LATERAL approach. At the conclusion of the procedure Tribell tissue marker clip was deployed into the biopsy cavity. Follow-up 2-view mammogram was performed and dictated separately. IMPRESSION: Ultrasound guided biopsy of mass in the 12 o'clock location of the RIGHT breast and enlarged RIGHT axillary lymph node. No apparent complications. Electronically Signed: By: Nolon Nations M.D. On:  06/04/2021 09:38  MM CLIP PLACEMENT RIGHT  Result Date: 06/04/2021 CLINICAL DATA:  Status post ultrasound-guided core biopsy of RIGHT axillary mass in RIGHT breast mass. Clip was placed in a small satellite lesion. EXAM: 3D DIAGNOSTIC RIGHT MAMMOGRAM POST ULTRASOUND BIOPSY x2 COMPARISON:  Previous exam(s). FINDINGS: 3D Mammographic images were obtained following ultrasound guided biopsy of mass in the 12 o'clock location of the RIGHT breast and placement of a coil shaped clip. The biopsy marking clip is in expected position at the site of biopsy. A heart shaped clip was placed within a satellite nodule along the superolateral aspect of the index lesion. The heart shaped clip is in the expected location. Following ultrasound biopsy of RIGHT axillary lymph node, a tribell clip was placed and is identified in the expected location. The heart and coil shaped clips are 2.5 centimeters apart on the craniocaudal projection. A ribbon shaped clip was placed at the time of a 2020 biopsy showing benign fibrosis. IMPRESSION: Tissue marker clips are in the expected locations after biopsy. Final Assessment: Post Procedure Mammograms for Marker Placement Electronically Signed   By: Nolon Nations M.D.   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 This was found to be concordant by Dr. Nolon Nations. Following biopsy, a heart shaped clip was deployed into the small satellite lesion along the superolateral aspect of the dominant mass. This lesion was not sampled individually. Pathology revealed LYMPH NODE TISSUE NEGATIVE FOR METASTATIC CARCINOMA of the RIGHT axilla, tribell clip. This was found to be concordant by Dr. Nolon Nations. Pathology results were discussed with the patient by telephone. The patient  reported doing well after the biopsies with tenderness at the sites. Post biopsy instructions and care were reviewed and questions were answered. The patient was encouraged to call [facility] for any additional concerns. The patient was referred to [facility] on June 10, 2021. Breast MRI recommended for extent of disease. Pathology results reported  by Stacie Acres RN on 06/05/2021. Electronically Signed   By: Nolon Nations M.D.   On: 06/05/2021 13:25   Result Date: 06/05/2021 CLINICAL DATA:  Patient presents for ultrasound-guided core biopsy of RIGHT breast mass and enlarged RIGHT axillary lymph node. EXAM: [name] AXILLARY NODE CORE BIOPSY ULTRASOUND RIGHT BREAST CORE NEEDLE BIOPSY COMPARISON:  Previous exam(s). PROCEDURE: I met with the patient and we discussed the procedure of ultrasound-guided biopsy, including benefits and alternatives. We discussed the high likelihood of a successful procedure. We discussed the risks of the procedure, including infection, bleeding, tissue injury, clip migration, and inadequate sampling. Informed written consent was given. The usual time-out protocol was performed immediately prior to the procedure. Prior to the biopsy, ultrasound was performed for planning purposes. During this exam, a small oval mass with indistinct margins is identified in the 10 o'clock location of the RIGHT breast 5 centimeters from the nipple measuring 0.5 x 0.3 x 0.5 centimeters. Site 1: RIGHT breast 12 o'clock. Lesion quadrant: 12 o'clock RIGHT breast, coil clip Using sterile technique and 1% lidocaine and 1% lidocaine with epinephrine as local anesthetic, under direct ultrasound visualization, a 12 gauge spring-loaded device was used to perform biopsy of mass in the 12 o'clock location of the RIGHT breast using a MEDIAL approach. At the conclusion of the procedure coil shaped tissue marker clip  was deployed into the biopsy cavity. Following biopsy, a heart shaped clip was deployed into the small satellite lesion along the superolateral aspect of the dominant mass. This lesion was not sampled individually. Site 2: RIGHT axilla.  Lesion quadrant: RIGHT axilla, Tribell clip Using sterile technique and 1% lidocaine as local anesthetic, under direct ultrasound visualization, a 14 gauge spring-loaded device was used to perform biopsy of enlarged RIGHT axillary lymph node using a LATERAL approach. At the conclusion of the procedure Tribell tissue marker clip was deployed into the biopsy cavity. Follow-up 2-view mammogram was performed and dictated separately. IMPRESSION: Ultrasound guided biopsy of mass in the 12 o'clock location of the RIGHT breast and enlarged RIGHT axillary lymph node. No apparent complications. Electronically Signed: By: Nolon Nations M.D. On: 06/04/2021 09:38    ELIGIBLE FOR AVAILABLE RESEARCH PROTOCOL: no  ASSESSMENT: 45 y.o. [place] woman status post right breast upper outer quadrant biopsy 06/04/2021 for a clinical mT2 N0 invasive ductal carcinoma, grade 3, estrogen and progesterone receptor weakly positive, HER2 not amplified, with an MIB-1 of 70%  (A) chest CT with contrast 06/22/2021 shows right breast mass, no other lesions of concern; pulmonary nodules all less than 5 mm noted, likely benign  (B) bone scan 06/22/2021 negative for metastases; consistent with sacroiliitis (1) genetics testing: (A) [place] BRCAplus dated 06/16/2021 tensions in ATM, BRCA1, BRCA2, CDH1, CHEK2, PALB2, PTEN, and TP53.  (2) neoadjuvant chemotherapy consisting of doxorubicin and cyclophosphamide in dose dense fashion x4 beginning 06/25/2021, followed by weekly paclitaxel x12  (3) definitive surgery to follow  (4) adjuvant radiation as appropriate  (5) antiestrogens  PLAN:  Torry is ready to start her first cycle of chemotherapy today.  She has a very good understanding of the possible  toxicities side effects and complications and is ready to proceed.  I reviewed the pain she is likely to develop after the pegfilgrastim injection and she will take Tylenol and Aleve together up to 3 times a day as needed.  If that is not adequate she will call us and we will call in tramadol for her.  I gave her the number to call after-hours and  also the number of my nurse to call during hours to facilitate communication.  I asked her to keep a diary of symptoms so we can troubleshoot problems when she returns to see me a week from today.  I also added fluids to her injection on day 3 in case she is slightly dehydrated at that time.  She understands that if she does not wish to receive fluids that day she should let them know not to access her port that day  She is already thinking about a possible reconstruction if she decides to go on bilateral mastectomies.  She is specifically considering nipple sparing mastectomies with reconstruction.  She has already met with Dr. Claudia Desanctis.  She will be seeking other opinions and has a ready met with Dr. Derrill Center at [facility] who incidentally confirmed for her above the plan of treatment she is receiving is the standard of care.  She will see me again in a week.  We will check for early response at that time and also for nadir counts  I also added plain films of her pelvis to clarify the questions raised by her bone scan but I reassured her that I do not see anything on either of her staging studies suggestive of metastatic disease  Total encounter time 35 minutes.Sarajane Jews C. Jayde Daffin, MD 06/25/2021 8:40 AM Medical Oncology and Hematology [facility] [place], [place] [address] Tel. [phone]    Fax. [phone]   This document serves as a record of services personally performed by Lurline Del, MD. It was created on his behalf by Wilburn Mylar, a trained medical scribe. The creation of this record is based on the scribe's  personal observations and the provider's statements to them.   I, Lurline Del MD, have reviewed the above documentation for accuracy and completeness, and I agree with the above.   *Total Encounter Time as defined by the Centers for Medicare and Medicaid Services includes, in addition to the face-to-face time of a patient visit (documented in the note above) non-face-to-face time: obtaining and reviewing outside history, ordering and reviewing medications, tests or procedures, care coordination (communications with other health care professionals or caregivers) and documentation in the medical record.

## 2021-06-24 NOTE — Discharge Instructions (Addendum)
Ok to shower after chemo tomorrow  No vigorous activity for one week  Extra strength tylenol and ibuprofen also for pain  (No Tylenol until 5:30pm)  Post Anesthesia Home Care Instructions  Activity: Get plenty of rest for the remainder of the day. A responsible individual must stay with you for 24 hours following the procedure.  For the next 24 hours, DO NOT: -Drive a car -Paediatric nurse -Drink alcoholic beverages -Take any medication unless instructed by your physician -Make any legal decisions or sign important papers.  Meals: Start with liquid foods such as gelatin or soup. Progress to regular foods as tolerated. Avoid greasy, spicy, heavy foods. If nausea and/or vomiting occur, drink only clear liquids until the nausea and/or vomiting subsides. Call your physician if vomiting continues.  Special Instructions/Symptoms: Your throat may feel dry or sore from the anesthesia or the breathing tube placed in your throat during surgery. If this causes discomfort, gargle with warm salt water. The discomfort should disappear within 24 hours.  If you had a scopolamine patch placed behind your ear for the management of post- operative nausea and/or vomiting:  1. The medication in the patch is effective for 72 hours, after which it should be removed.  Wrap patch in a tissue and discard in the trash. Wash hands thoroughly with soap and water. 2. You may remove the patch earlier than 72 hours if you experience unpleasant side effects which may include dry mouth, dizziness or visual disturbances. 3. Avoid touching the patch. Wash your hands with soap and water after contact with the patch.

## 2021-06-24 NOTE — Progress Notes (Signed)
Called pt to introduce myself as her Arboriculturist, discuss copay assistance and the [employer].  Pt believes she has met her deductible for the year so her ins company should pay for her treatment at 100%.  If she receives a bill for the [employer] I will give her my card on 06/25/21 to contact me to apply for copay assistance at that time.  I also informed her of the [employer], went over what it covers and gave her the income requirement.  She stated she's over the income requirement so she doesn't qualify for the grant at this time.

## 2021-06-24 NOTE — Anesthesia Procedure Notes (Signed)
Procedure Name: LMA Insertion Date/Time: 06/24/2021 12:16 PM Performed by: Signe Colt, CRNA Pre-anesthesia Checklist: Patient identified, Emergency Drugs available, Suction available and Patient being monitored Patient Re-evaluated:Patient Re-evaluated prior to induction Oxygen Delivery Method: Circle System Utilized Preoxygenation: Pre-oxygenation with 100% oxygen Induction Type: IV induction Ventilation: Mask ventilation without difficulty LMA: LMA inserted LMA Size: 4.0 Number of attempts: 1 Airway Equipment and Method: bite block Placement Confirmation: positive ETCO2 Tube secured with: Tape Dental Injury: Teeth and Oropharynx as per pre-operative assessment

## 2021-06-24 NOTE — Op Note (Signed)
INSERTION PORT-A-CATH  Procedure Note  Ebony Lamb 06/24/2021   Pre-op Diagnosis: right breast cancer     Post-op Diagnosis: same  Procedure(s): INSERTION PORT-A-CATH LEFT SUBCLAVIAN VEIN (8 FR)  Surgeon(s): Coralie Keens, MD  Anesthesia: General  Staff:  Circulator: Donnita Falls, RN Scrub Person: Courtney Heys S  Estimated Blood Loss: Minimal               Indications: This is a 45 year old female recently diagnosed with invasive right breast cancer.  Given the size and receptor status of the tumor and after discussion with the multidisciplinary breast cancer conference, the decision has been made to proceed with neoadjuvant therapy.  A Port-A-Cath is being placed for chemotherapy which is to begin tomorrow  Procedure: The patient was brought to operating identifies correct patient.  She was placed upon the operating table general anesthesia was induced.  Her left chest and neck were then prepped and draped in usual sterile fashion.  A shoulder roll had been placed between her shoulders prior to prepping.  She was then placed in the Trendelenburg position.  I anesthetized the left chest and clavicle with Marcaine.  I then used the seeker needle to easily cannulate the left subclavian vein.  A wire was then passed through the needle into the central venous system under fluoroscopy.  I then removed the needle.  Next I anesthetized skin further with Marcaine and made incision with a scalpel incorporating the introduction site.  I then created a pocket for the port with the electrocautery.  An 8 French [place] port was brought to the field.  It was flushed appropriately.  It fit easily into the pocket.  I next placed the venous dilator introducer sheath over the wire and into the central venous system.  The wire and dilator were then removed.  I then attached the catheter to the port and cut an appropriate length.  The port was placed into the pocket and then the catheter was fed down  the peel-away sheath.  The sheath was peeled away leaving the catheter in central venous system.  I accessed the catheter in good flush and return were demonstrated.  X-ray confirmed placement in the superior vena cava on fluoroscopy.  I then sutured the port to the chest wall 2 separate 3-0 Prolene sutures.  I then closed the subcutaneous tissue with interrupted 3-0 Vicryl sutures and closed the skin with a running 4-0 Monocryl.  Dermabond was then applied.  I again accessed the port through the skin and instilled concentrated heparin solution into the port.  The port was then left accessed.  Dressings were then applied.  The patient tolerated the procedure well.  All the counts were correct at the end of the procedure.  She was then extubated in the operating room and taken in a stable condition to the recovery room.          Coralie Keens   Date: 06/24/2021  Time: 12:48 PM

## 2021-06-24 NOTE — Anesthesia Postprocedure Evaluation (Signed)
Anesthesia Post Note  Patient: Ebony Lamb  Procedure(s) Performed: INSERTION PORT-A-CATH (Breast)     Patient location during evaluation: PACU Anesthesia Type: General Level of consciousness: awake and alert Pain management: pain level controlled Vital Signs Assessment: post-procedure vital signs reviewed and stable Respiratory status: spontaneous breathing, nonlabored ventilation and respiratory function stable Cardiovascular status: blood pressure returned to baseline and stable Postop Assessment: no apparent nausea or vomiting Anesthetic complications: no   No notable events documented.  Last Vitals:  Vitals:   06/24/21 1345 06/24/21 1400  BP: 121/86   Pulse: 87   Resp: 18 14  Temp:    SpO2: 99%     Last Pain:  Vitals:   06/24/21 1315  TempSrc:   PainSc: 0-No pain                 Lidia Collum

## 2021-06-24 NOTE — Transfer of Care (Signed)
Immediate Anesthesia Transfer of Care Note  Patient: Ebony Lamb  Procedure(s) Performed: INSERTION PORT-A-CATH (Breast)  Patient Location: PACU  Anesthesia Type:General  Level of Consciousness: drowsy and patient cooperative  Airway & Oxygen Therapy: Patient Spontanous Breathing and Patient connected to face mask oxygen  Post-op Assessment: Report given to RN and Post -op Vital signs reviewed and stable  Post vital signs: Reviewed and stable  Last Vitals:  Vitals Value Taken Time  BP 92/58 06/24/21 1250  Temp    Pulse 78 06/24/21 1251  Resp 14 06/24/21 1251  SpO2 99 % 06/24/21 1251  Vitals shown include unvalidated device data.  Last Pain:  Vitals:   06/24/21 1129  TempSrc: Oral  PainSc: 0-No pain      Patients Stated Pain Goal: 0 (36/85/99 2341)  Complications: No notable events documented.

## 2021-06-24 NOTE — Anesthesia Preprocedure Evaluation (Signed)
Anesthesia Evaluation  Patient identified by MRN, date of birth, ID band Patient awake    Reviewed: Allergy & Precautions, NPO status , Patient's Chart, lab work & pertinent test results  History of Anesthesia Complications Negative for: history of anesthetic complications  Airway Mallampati: I  TM Distance: >3 FB Neck ROM: Full   Comment: Narrow palate Dental  (+) Dental Advisory Given, Teeth Intact   Pulmonary neg pulmonary ROS,    Pulmonary exam normal        Cardiovascular negative cardio ROS Normal cardiovascular exam     Neuro/Psych negative neurological ROS     GI/Hepatic negative GI ROS, Neg liver ROS,   Endo/Other  negative endocrine ROS  Renal/GU negative Renal ROS  negative genitourinary   Musculoskeletal negative musculoskeletal ROS (+)   Abdominal   Peds  Hematology negative hematology ROS (+)   Anesthesia Other Findings  Breast cancer  Reproductive/Obstetrics                            Anesthesia Physical Anesthesia Plan  ASA: 2  Anesthesia Plan: General   Post-op Pain Management:    Induction: Intravenous  PONV Risk Score and Plan: 3 and Ondansetron, Dexamethasone, Midazolam and Treatment may vary due to age or medical condition  Airway Management Planned: LMA  Additional Equipment: None  Intra-op Plan:   Post-operative Plan: Extubation in OR  Informed Consent: I have reviewed the patients History and Physical, chart, labs and discussed the procedure including the risks, benefits and alternatives for the proposed anesthesia with the patient or authorized representative who has indicated his/her understanding and acceptance.     Dental advisory given  Plan Discussed with:   Anesthesia Plan Comments:         Anesthesia Quick Evaluation

## 2021-06-24 NOTE — Interval H&P Note (Signed)
History and Physical Interval Note:no change in H and P  06/24/2021 11:49 AM  Ebony Lamb  has presented today for surgery, with the diagnosis of RIGHT BREAST CANCER.  The various methods of treatment have been discussed with the patient and family. After consideration of risks, benefits and other options for treatment, the patient has consented to  Procedure(s): INSERTION PORT-A-CATH (N/A) as a surgical intervention.  The patient's history has been reviewed, patient examined, no change in status, stable for surgery.  I have reviewed the patient's chart and labs.  Questions were answered to the patient's satisfaction.     Coralie Keens

## 2021-06-25 ENCOUNTER — Encounter: Payer: Self-pay | Admitting: *Deleted

## 2021-06-25 ENCOUNTER — Inpatient Hospital Stay (HOSPITAL_BASED_OUTPATIENT_CLINIC_OR_DEPARTMENT_OTHER): Payer: No Typology Code available for payment source | Admitting: Oncology

## 2021-06-25 ENCOUNTER — Inpatient Hospital Stay: Payer: No Typology Code available for payment source

## 2021-06-25 VITALS — BP 132/81 | HR 76 | Temp 97.7°F | Resp 16 | Ht 66.0 in | Wt 126.1 lb

## 2021-06-25 DIAGNOSIS — Z170 Estrogen receptor positive status [ER+]: Secondary | ICD-10-CM

## 2021-06-25 DIAGNOSIS — C50411 Malignant neoplasm of upper-outer quadrant of right female breast: Secondary | ICD-10-CM | POA: Diagnosis not present

## 2021-06-25 DIAGNOSIS — I10 Essential (primary) hypertension: Secondary | ICD-10-CM | POA: Diagnosis not present

## 2021-06-25 DIAGNOSIS — E785 Hyperlipidemia, unspecified: Secondary | ICD-10-CM | POA: Diagnosis not present

## 2021-06-25 DIAGNOSIS — H538 Other visual disturbances: Secondary | ICD-10-CM | POA: Diagnosis not present

## 2021-06-25 DIAGNOSIS — R059 Cough, unspecified: Secondary | ICD-10-CM | POA: Diagnosis not present

## 2021-06-25 DIAGNOSIS — Z5189 Encounter for other specified aftercare: Secondary | ICD-10-CM | POA: Diagnosis not present

## 2021-06-25 DIAGNOSIS — K649 Unspecified hemorrhoids: Secondary | ICD-10-CM | POA: Diagnosis not present

## 2021-06-25 DIAGNOSIS — Z95828 Presence of other vascular implants and grafts: Secondary | ICD-10-CM

## 2021-06-25 DIAGNOSIS — R5383 Other fatigue: Secondary | ICD-10-CM | POA: Diagnosis not present

## 2021-06-25 DIAGNOSIS — F329 Major depressive disorder, single episode, unspecified: Secondary | ICD-10-CM | POA: Diagnosis not present

## 2021-06-25 DIAGNOSIS — Z5111 Encounter for antineoplastic chemotherapy: Secondary | ICD-10-CM | POA: Diagnosis not present

## 2021-06-25 LAB — CMP (CANCER CENTER ONLY)
ALT: 13 U/L (ref 0–44)
AST: 19 U/L (ref 15–41)
Albumin: 3.9 g/dL (ref 3.5–5.0)
Alkaline Phosphatase: 41 U/L (ref 38–126)
Anion gap: 7 (ref 5–15)
BUN: 6 mg/dL (ref 6–20)
CO2: 22 mmol/L (ref 22–32)
Calcium: 8.7 mg/dL — ABNORMAL LOW (ref 8.9–10.3)
Chloride: 106 mmol/L (ref 98–111)
Creatinine: 0.55 mg/dL (ref 0.44–1.00)
GFR, Estimated: >60 mL/min (ref >60)
Glucose, Bld: 117 mg/dL — ABNORMAL HIGH (ref 70–99)
Potassium: 3.3 mmol/L — ABNORMAL LOW (ref 3.5–5.1)
Sodium: 135 mmol/L (ref 135–145)
Total Bilirubin: 0.8 mg/dL (ref 0.3–1.2)
Total Protein: 6.5 g/dL (ref 6.5–8.1)

## 2021-06-25 LAB — CBC WITH DIFFERENTIAL (CANCER CENTER ONLY)
Abs Immature Granulocytes: 0.02 10*3/uL (ref 0.00–0.07)
Basophils Absolute: 0.1 10*3/uL (ref 0.0–0.1)
Basophils Relative: 1 %
Eosinophils Absolute: 0 10*3/uL (ref 0.0–0.5)
Eosinophils Relative: 0 %
HCT: 38.2 % (ref 36.0–46.0)
Hemoglobin: 12.8 g/dL (ref 12.0–15.0)
Immature Granulocytes: 0 %
Lymphocytes Relative: 43 %
Lymphs Abs: 4.2 10*3/uL — ABNORMAL HIGH (ref 0.7–4.0)
MCH: 30.5 pg (ref 26.0–34.0)
MCHC: 33.5 g/dL (ref 30.0–36.0)
MCV: 91 fL (ref 80.0–100.0)
Monocytes Absolute: 0.4 10*3/uL (ref 0.1–1.0)
Monocytes Relative: 4 %
Neutro Abs: 5 10*3/uL (ref 1.7–7.7)
Neutrophils Relative %: 52 %
Platelet Count: 280 10*3/uL (ref 150–400)
RBC: 4.2 MIL/uL (ref 3.87–5.11)
RDW: 11.3 % — ABNORMAL LOW (ref 11.5–15.5)
WBC Count: 9.7 10*3/uL (ref 4.0–10.5)
nRBC: 0 % (ref 0.0–0.2)

## 2021-06-25 MED ORDER — SODIUM CHLORIDE 0.9 % IV SOLN
150.0000 mg | Freq: Once | INTRAVENOUS | Status: AC
  Administered 2021-06-25: 150 mg via INTRAVENOUS
  Filled 2021-06-25: qty 150

## 2021-06-25 MED ORDER — HEPARIN SOD (PORK) LOCK FLUSH 100 UNIT/ML IV SOLN
500.0000 [IU] | Freq: Once | INTRAVENOUS | Status: AC | PRN
  Administered 2021-06-25: 500 [IU]

## 2021-06-25 MED ORDER — SODIUM CHLORIDE 0.9 % IV SOLN
10.0000 mg | Freq: Once | INTRAVENOUS | Status: AC
  Administered 2021-06-25: 10 mg via INTRAVENOUS
  Filled 2021-06-25: qty 10

## 2021-06-25 MED ORDER — SODIUM CHLORIDE 0.9 % IV SOLN
600.0000 mg/m2 | Freq: Once | INTRAVENOUS | Status: AC
  Administered 2021-06-25: 980 mg via INTRAVENOUS
  Filled 2021-06-25: qty 49

## 2021-06-25 MED ORDER — PALONOSETRON HCL INJECTION 0.25 MG/5ML
0.2500 mg | Freq: Once | INTRAVENOUS | Status: AC
  Administered 2021-06-25: 0.25 mg via INTRAVENOUS
  Filled 2021-06-25: qty 5

## 2021-06-25 MED ORDER — DOXORUBICIN HCL CHEMO IV INJECTION 2 MG/ML
60.0000 mg/m2 | Freq: Once | INTRAVENOUS | Status: AC
  Administered 2021-06-25: 98 mg via INTRAVENOUS
  Filled 2021-06-25: qty 49

## 2021-06-25 MED ORDER — SODIUM CHLORIDE 0.9 % IV SOLN: Freq: Once | INTRAVENOUS | Status: AC

## 2021-06-25 MED ORDER — SODIUM CHLORIDE 0.9% FLUSH
10.0000 mL | INTRAVENOUS | Status: DC | PRN
  Administered 2021-06-25: 10 mL

## 2021-06-25 MED ORDER — SODIUM CHLORIDE 0.9% FLUSH
10.0000 mL | INTRAVENOUS | Status: DC | PRN
  Administered 2021-06-25: 10 mL via INTRAVENOUS

## 2021-06-25 NOTE — Patient Instructions (Addendum)
[facility]  Discharge Instructions: Thank you for choosing [facility] to provide your oncology and hematology care.   If you have a lab appointment with the [facility], please go directly to the [facility] and check in at the registration area.   Wear comfortable clothing and clothing appropriate for easy access to any Portacath or PICC line.   We strive to give you quality time with your provider. You may need to reschedule your appointment if you arrive late (15 or more minutes).  Arriving late affects you and other patients whose appointments are after yours.  Also, if you miss three or more appointments without notifying the office, you may be dismissed from the clinic at the provider's discretion.      For prescription refill requests, have your pharmacy contact our office and allow 72 hours for refills to be completed.    Today you received the following chemotherapy and/or immunotherapy agents Doxorubicin and Cyclophosphamide.      To help prevent nausea and vomiting after your treatment, we encourage you to take your nausea medication as directed.  BELOW ARE SYMPTOMS THAT SHOULD BE REPORTED IMMEDIATELY: *FEVER GREATER THAN 100.4 F (38 C) OR HIGHER *CHILLS OR SWEATING *NAUSEA AND VOMITING THAT IS NOT CONTROLLED WITH YOUR NAUSEA MEDICATION *UNUSUAL SHORTNESS OF BREATH *UNUSUAL BRUISING OR BLEEDING *URINARY PROBLEMS (pain or burning when urinating, or frequent urination) *BOWEL PROBLEMS (unusual diarrhea, constipation, pain near the anus) TENDERNESS IN MOUTH AND THROAT WITH OR WITHOUT PRESENCE OF ULCERS (sore throat, sores in mouth, or a toothache) UNUSUAL RASH, SWELLING OR PAIN  UNUSUAL VAGINAL DISCHARGE OR ITCHING   Items with * indicate a potential emergency and should be followed up as soon as possible or go to the Emergency Department if any problems should occur.  Please show the CHEMOTHERAPY ALERT CARD or IMMUNOTHERAPY  ALERT CARD at check-in to the Emergency Department and triage nurse.  Should you have questions after your visit or need to cancel or reschedule your appointment, please contact [facility]  Dept: [phone]  and follow the prompts.  Office hours are 8:00 a.m. to 4:30 p.m. Monday - Friday. Please note that voicemails left after 4:00 p.m. may not be returned until the following business day.  We are closed weekends and major holidays. You have access to a nurse at all times for urgent questions. Please call the main number to the clinic Dept: [phone] and follow the prompts.   For any non-urgent questions, you may also contact your provider using MyChart. We now offer e-Visits for anyone 8 and older to request care online for non-urgent symptoms. For details visit [email or web address].   Also download the MyChart app! Go to the app store, search "MyChart", open the app, select [facility], and log in with your MyChart username and password.  Due to Covid, a mask is required upon entering the hospital/clinic. If you do not have a mask, one will be given to you upon arrival. For doctor visits, patients may have 1 support person aged 27 or older with them. For treatment visits, patients cannot have anyone with them due to current Covid guidelines and our immunocompromised population.   Doxorubicin injection What is this medication? DOXORUBICIN (dox oh ROO bi sin) is a chemotherapy drug. It is used to treat many kinds of cancer like leukemia, lymphoma, neuroblastoma, sarcoma, and Wilms' tumor. It is also used to treat bladder cancer, breast cancer, lung cancer, ovarian cancer,  stomach cancer, and thyroid cancer. This medicine may be used for other purposes; ask your health care provider or pharmacist if you have questions. COMMON BRAND NAME(S): Adriamycin, Adriamycin PFS, Adriamycin RDF, Rubex What should I tell my care team before I take this medication? They need  to know if you have any of these conditions: heart disease history of low blood counts caused by a medicine liver disease recent or ongoing radiation therapy an unusual or allergic reaction to doxorubicin, other chemotherapy agents, other medicines, foods, dyes, or preservatives pregnant or trying to get pregnant breast-feeding How should I use this medication? This drug is given as an infusion into a vein. It is administered in a hospital or clinic by a specially trained health care professional. If you have pain, swelling, burning or any unusual feeling around the site of your injection, tell your health care professional right away. Talk to your pediatrician regarding the use of this medicine in children. Special care may be needed. Overdosage: If you think you have taken too much of this medicine contact a poison control center or emergency room at once. NOTE: This medicine is only for you. Do not share this medicine with others. What if I miss a dose? It is important not to miss your dose. Call your doctor or health care professional if you are unable to keep an appointment. What may interact with this medication? This medicine may interact with the following medications: 6-mercaptopurine paclitaxel phenytoin St. John's Wort trastuzumab verapamil This list may not describe all possible interactions. Give your health care provider a list of all the medicines, herbs, non-prescription drugs, or dietary supplements you use. Also tell them if you smoke, drink alcohol, or use illegal drugs. Some items may interact with your medicine. What should I watch for while using this medication? This drug may make you feel generally unwell. This is not uncommon, as chemotherapy can affect healthy cells as well as cancer cells. Report any side effects. Continue your course of treatment even though you feel ill unless your doctor tells you to stop. There is a maximum amount of this medicine you should  receive throughout your life. The amount depends on the medical condition being treated and your overall health. Your doctor will watch how much of this medicine you receive in your lifetime. Tell your doctor if you have taken this medicine before. You may need blood work done while you are taking this medicine. Your urine may turn red for a few days after your dose. This is not blood. If your urine is dark or brown, call your doctor. In some cases, you may be given additional medicines to help with side effects. Follow all directions for their use. Call your doctor or health care professional for advice if you get a fever, chills or sore throat, or other symptoms of a cold or flu. Do not treat yourself. This drug decreases your body's ability to fight infections. Try to avoid being around people who are sick. This medicine may increase your risk to bruise or bleed. Call your doctor or health care professional if you notice any unusual bleeding. Talk to your doctor about your risk of cancer. You may be more at risk for certain types of cancers if you take this medicine. Do not become pregnant while taking this medicine or for 6 months after stopping it. Women should inform their doctor if they wish to become pregnant or think they might be pregnant. Men should not father a child while  taking this medicine and for 6 months after stopping it. There is a potential for serious side effects to an unborn child. Talk to your health care professional or pharmacist for more information. Do not breast-feed an infant while taking this medicine. This medicine has caused ovarian failure in some women and reduced sperm counts in some men This medicine may interfere with the ability to have a child. Talk with your doctor or health care professional if you are concerned about your fertility. This medicine may cause a decrease in Co-Enzyme Q-10. You should make sure that you get enough Co-Enzyme Q-10 while you are taking  this medicine. Discuss the foods you eat and the vitamins you take with your health care professional. What side effects may I notice from receiving this medication? Side effects that you should report to your doctor or health care professional as soon as possible: allergic reactions like skin rash, itching or hives, swelling of the face, lips, or tongue breathing problems chest pain fast or irregular heartbeat low blood counts - this medicine may decrease the number of white blood cells, red blood cells and platelets. You may be at increased risk for infections and bleeding. pain, redness, or irritation at site where injected signs of infection - fever or chills, cough, sore throat, pain or difficulty passing urine signs of decreased platelets or bleeding - bruising, pinpoint red spots on the skin, black, tarry stools, blood in the urine swelling of the ankles, feet, hands tiredness weakness Side effects that usually do not require medical attention (report to your doctor or health care professional if they continue or are bothersome): diarrhea hair loss mouth sores nail discoloration or damage nausea red colored urine vomiting This list may not describe all possible side effects. Call your doctor for medical advice about side effects. You may report side effects to FDA at 1-800-FDA-1088. Where should I keep my medication? This drug is given in a hospital or clinic and will not be stored at home. NOTE: This sheet is a summary. It may not cover all possible information. If you have questions about this medicine, talk to your doctor, pharmacist, or health care provider.  2022 Elsevier/Gold Standard (2017-04-13 11:01:26)  Cyclophosphamide Injection What is this medication? CYCLOPHOSPHAMIDE (sye kloe FOSS fa mide) is a chemotherapy drug. It slows the growth of cancer cells. This medicine is used to treat many types of cancer like lymphoma, myeloma, leukemia, breast cancer, and ovarian  cancer, to name a few. This medicine may be used for other purposes; ask your health care provider or pharmacist if you have questions. COMMON BRAND NAME(S): Cytoxan, Neosar What should I tell my care team before I take this medication? They need to know if you have any of these conditions: heart disease history of irregular heartbeat infection kidney disease liver disease low blood counts, like white cells, platelets, or red blood cells on hemodialysis recent or ongoing radiation therapy scarring or thickening of the lungs trouble passing urine an unusual or allergic reaction to cyclophosphamide, other medicines, foods, dyes, or preservatives pregnant or trying to get pregnant breast-feeding How should I use this medication? This drug is usually given as an injection into a vein or muscle or by infusion into a vein. It is administered in a hospital or clinic by a specially trained health care professional. Talk to your pediatrician regarding the use of this medicine in children. Special care may be needed. Overdosage: If you think you have taken too much of this medicine contact  a poison control center or emergency room at once. NOTE: This medicine is only for you. Do not share this medicine with others. What if I miss a dose? It is important not to miss your dose. Call your doctor or health care professional if you are unable to keep an appointment. What may interact with this medication? amphotericin B azathioprine certain antivirals for HIV or hepatitis certain medicines for blood pressure, heart disease, irregular heart beat certain medicines that treat or prevent blood clots like warfarin certain other medicines for cancer cyclosporine etanercept indomethacin medicines that relax muscles for surgery medicines to increase blood counts metronidazole This list may not describe all possible interactions. Give your health care provider a list of all the medicines, herbs,  non-prescription drugs, or dietary supplements you use. Also tell them if you smoke, drink alcohol, or use illegal drugs. Some items may interact with your medicine. What should I watch for while using this medication? Your condition will be monitored carefully while you are receiving this medicine. You may need blood work done while you are taking this medicine. Drink water or other fluids as directed. Urinate often, even at night. Some products may contain alcohol. Ask your health care professional if this medicine contains alcohol. Be sure to tell all health care professionals you are taking this medicine. Certain medicines, like metronidazole and disulfiram, can cause an unpleasant reaction when taken with alcohol. The reaction includes flushing, headache, nausea, vomiting, sweating, and increased thirst. The reaction can last from 30 minutes to several hours. Do not become pregnant while taking this medicine or for 1 year after stopping it. Women should inform their health care professional if they wish to become pregnant or think they might be pregnant. Men should not father a child while taking this medicine and for 4 months after stopping it. There is potential for serious side effects to an unborn child. Talk to your health care professional for more information. Do not breast-feed an infant while taking this medicine or for 1 week after stopping it. This medicine has caused ovarian failure in some women. This medicine may make it more difficult to get pregnant. Talk to your health care professional if you are concerned about your fertility. This medicine has caused decreased sperm counts in some men. This may make it more difficult to father a child. Talk to your health care professional if you are concerned about your fertility. Call your health care professional for advice if you get a fever, chills, or sore throat, or other symptoms of a cold or flu. Do not treat yourself. This medicine  decreases your body's ability to fight infections. Try to avoid being around people who are sick. Avoid taking medicines that contain aspirin, acetaminophen, ibuprofen, naproxen, or ketoprofen unless instructed by your health care professional. These medicines may hide a fever. Talk to your health care professional about your risk of cancer. You may be more at risk for certain types of cancer if you take this medicine. If you are going to need surgery or other procedure, tell your health care professional that you are using this medicine. Be careful brushing or flossing your teeth or using a toothpick because you may get an infection or bleed more easily. If you have any dental work done, tell your dentist you are receiving this medicine. What side effects may I notice from receiving this medication? Side effects that you should report to your doctor or health care professional as soon as possible: allergic reactions like  skin rash, itching or hives, swelling of the face, lips, or tongue breathing problems nausea, vomiting signs and symptoms of bleeding such as bloody or black, tarry stools; red or dark brown urine; spitting up blood or brown material that looks like coffee grounds; red spots on the skin; unusual bruising or bleeding from the eyes, gums, or nose signs and symptoms of heart failure like fast, irregular heartbeat, sudden weight gain; swelling of the ankles, feet, hands signs and symptoms of infection like fever; chills; cough; sore throat; pain or trouble passing urine signs and symptoms of kidney injury like trouble passing urine or change in the amount of urine signs and symptoms of liver injury like dark yellow or brown urine; general ill feeling or flu-like symptoms; light-colored stools; loss of appetite; nausea; right upper belly pain; unusually weak or tired; yellowing of the eyes or skin Side effects that usually do not require medical attention (report to your doctor or health  care professional if they continue or are bothersome): confusion decreased hearing diarrhea facial flushing hair loss headache loss of appetite missed menstrual periods signs and symptoms of low red blood cells or anemia such as unusually weak or tired; feeling faint or lightheaded; falls skin discoloration This list may not describe all possible side effects. Call your doctor for medical advice about side effects. You may report side effects to FDA at 1-800-FDA-1088. Where should I keep my medication? This drug is given in a hospital or clinic and will not be stored at home. NOTE: This sheet is a summary. It may not cover all possible information. If you have questions about this medicine, talk to your doctor, pharmacist, or health care provider.  2022 Elsevier/Gold Standard (2019-06-04 09:53:29)  Fatigue If you have fatigue, you feel tired all the time and have a lack of energy or a lack of motivation. Fatigue may make it difficult to start or complete tasks because of exhaustion. In general, occasional or mild fatigue is often a normal response to activity or life. However, long-lasting (chronic) or extreme fatigue may be a symptom of a medical condition. Follow these instructions at home: General instructions Watch your fatigue for any changes. Go to bed and get up at the same time every day. Avoid fatigue by pacing yourself during the day and getting enough sleep at night. Maintain a healthy weight. Medicines Take over-the-counter and prescription medicines only as told by your health care provider. Take a multivitamin, if told by your health care provider.  Do not use herbal or dietary supplements unless they are approved by your health care provider. Activity  Exercise regularly, as told by your health care provider. Use or practice techniques to help you relax, such as yoga, tai chi, meditation, or massage therapy. Eating and drinking  Avoid heavy meals in the  evening. Eat a well-balanced diet, which includes lean proteins, whole grains, plenty of fruits and vegetables, and low-fat dairy products. Avoid consuming too much caffeine. Avoid the use of alcohol. Drink enough fluid to keep your urine pale yellow. Lifestyle Change situations that cause you stress. Try to keep your work and personal schedule in balance. Do not use any products that contain nicotine or tobacco, such as cigarettes and e-cigarettes. If you need help quitting, ask your health care provider. Do not use drugs. Contact a health care provider if: Your fatigue does not get better. You have a fever. You suddenly lose or gain weight. You have headaches. You have trouble falling asleep or sleeping through  the night. You feel angry, guilty, anxious, or sad. You are unable to have a bowel movement (constipation). Your skin is dry. You have swelling in your legs or another part of your body. Get help right away if: You feel confused. Your vision is blurry. You feel faint or you pass out. You have a severe headache. You have severe pain in your abdomen, your back, or the area between your waist and hips (pelvis). You have chest pain, shortness of breath, or an irregular or fast heartbeat. You are unable to urinate, or you urinate less than normal. You have abnormal bleeding, such as bleeding from the rectum, vagina, nose, lungs, or nipples. You vomit blood. You have thoughts about hurting yourself or others. If you ever feel like you may hurt yourself or others, or have thoughts about taking your own life, get help right away. You can go to your nearest emergency department or call: Your local emergency services (911 in the [place] A suicide crisis helpline, such as the [facility] at (931)379-3875. This is open 24 hours a day. Summary If you have fatigue, you feel tired all the time and have a lack of energy or a lack of motivation. Fatigue may make it  difficult to start or complete tasks because of exhaustion. Long-lasting (chronic) or extreme fatigue may be a symptom of a medical condition. Exercise regularly, as told by your health care provider. Change situations that cause you stress. Try to keep your work and personal schedule in balance. This information is not intended to replace advice given to you by your health care provider. Make sure you discuss any questions you have with your health care provider. Document Revised: 07/10/2020 Document Reviewed: 07/10/2020 Elsevier Patient Education  2022 [employer].  Rehydration, Adult Rehydration is the replacement of body fluids, salts, and minerals (electrolytes) that are lost during dehydration. Dehydration is when there is not enough water or other fluids in the body. This happens when you lose more fluids than you take in. Common causes of dehydration include: Not drinking enough fluids. This can occur when you are ill or doing activities that require a lot of energy, especially in hot weather. Conditions that cause loss of water or other fluids, such as diarrhea, vomiting, sweating, or urinating a lot. Other illnesses, such as fever or infection. Certain medicines, such as those that remove excess fluid from the body (diuretics). Symptoms of mild or moderate dehydration may include thirst, dry lips and mouth, and dizziness. Symptoms of severe dehydration may include increased heart rate, confusion, fainting, and not urinating. For severe dehydration, you may need to get fluids through an IV at the hospital. For mild or moderate dehydration, you can usually rehydrate at home by drinking certain fluids as told by your health care provider. What are the risks? Generally, rehydration is safe. However, taking in too much fluid (overhydration) can be a problem. This is rare. Overhydration can cause an electrolyte imbalance, kidney failure, or a decrease in salt (sodium) levels in the  body. Supplies needed You will need an oral rehydration solution (ORS) if your health care provider tells you to use one. This is a drink to treat dehydration. It can be found in pharmacies and retail stores. How to rehydrate Fluids Follow instructions from your health care provider for rehydration. The kind of fluid and the amount you should drink depend on your condition. In general, you should choose drinks that you prefer. If told by your health care provider,  drink an ORS. Make an ORS by following instructions on the package. Start by drinking small amounts, about  cup (120 mL) every 5-10 minutes. Slowly increase how much you drink until you have taken the amount recommended by your health care provider. Drink enough clear fluids to keep your urine pale yellow. If you were told to drink an ORS, finish it first, then start slowly drinking other clear fluids. Drink fluids such as: Water. This includes sparkling water and flavored water. Drinking only water can lead to having too little sodium in your body (hyponatremia). Follow the advice of your health care provider. Water from ice chips you suck on. Fruit juice with water you add to it (diluted). Sports drinks. Hot or cold herbal teas. Broth-based soups. Milk or milk products. Food Follow instructions from your health care provider about what to eat while you rehydrate. Your health care provider may recommend that you slowly begin eating regular foods in small amounts. Eat foods that contain a healthy balance of electrolytes, such as bananas, oranges, potatoes, tomatoes, and spinach. Avoid foods that are greasy or contain a lot of sugar. In some cases, you may get nutrition through a feeding tube that is passed through your nose and into your stomach (nasogastric tube, or NG tube). This may be done if you have uncontrolled vomiting or diarrhea. Beverages to avoid Certain beverages may make dehydration worse. While you rehydrate, avoid  drinking alcohol. How to tell if you are recovering from dehydration You may be recovering from dehydration if: You are urinating more often than before you started rehydrating. Your urine is pale yellow. Your energy level improves. You vomit less frequently. You have diarrhea less frequently. Your appetite improves or returns to normal. You feel less dizzy or less light-headed. Your skin tone and color start to look more normal. Follow these instructions at home: Take over-the-counter and prescription medicines only as told by your health care provider. Do not take sodium tablets. Doing this can lead to having too much sodium in your body (hypernatremia). Contact a health care provider if: You continue to have symptoms of mild or moderate dehydration, such as: Thirst. Dry lips. Slightly dry mouth. Dizziness. Dark urine or less urine than normal. Muscle cramps. You continue to vomit or have diarrhea. Get help right away if you: Have symptoms of dehydration that get worse. Have a fever. Have a severe headache. Have been vomiting and the following happens: Your vomiting gets worse or does not go away. Your vomit includes blood or green matter (bile). You cannot eat or drink without vomiting. Have problems with urination or bowel movements, such as: Diarrhea that gets worse or does not go away. Blood in your stool (feces). This may cause stool to look black and tarry. Not urinating, or urinating only a small amount of very dark urine, within 6-8 hours. Have trouble breathing. Have symptoms that get worse with treatment. These symptoms may represent a serious problem that is an emergency. Do not wait to see if the symptoms will go away. Get medical help right away. Call your local emergency services (911 in the [place] Do not drive yourself to the hospital. Summary Rehydration is the replacement of body fluids and minerals (electrolytes) that are lost during dehydration. Follow  instructions from your health care provider for rehydration. The kind of fluid and amount you should drink depend on your condition. Slowly increase how much you drink until you have taken the amount recommended by your health care provider. Contact  your health care provider if you continue to show signs of mild or moderate dehydration. This information is not intended to replace advice given to you by your health care provider. Make sure you discuss any questions you have with your health care provider. Document Revised: 10/31/2019 Document Reviewed: 09/10/2019 Elsevier Patient Education  2022 [employer].

## 2021-06-26 ENCOUNTER — Telehealth: Payer: Self-pay | Admitting: Genetic Counselor

## 2021-06-26 ENCOUNTER — Encounter (HOSPITAL_BASED_OUTPATIENT_CLINIC_OR_DEPARTMENT_OTHER): Payer: Self-pay | Admitting: Surgery

## 2021-06-26 ENCOUNTER — Telehealth: Payer: Self-pay | Admitting: *Deleted

## 2021-06-26 NOTE — Telephone Encounter (Signed)
I contacted Ebony Lamb to discuss her genetic testing results. No pathogenic variants were identified in the 77 genes analyzed. 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   The test report has been scanned into EPIC and is located under the Molecular Pathology section of the Results Review tab.  A portion of the result report is included below for reference. Detailed clinic note to follow.  [name], MS, [facility] Genetic Counselor [email or web address] (P) [phone]

## 2021-06-26 NOTE — Telephone Encounter (Signed)
Received On-Call RN message from yesterday that pt called about diarrhea.  Called pt & she had one diarrhea stool & took 2 imodium & hasn't had any more diarrhea.

## 2021-06-27 ENCOUNTER — Inpatient Hospital Stay: Payer: No Typology Code available for payment source

## 2021-06-27 ENCOUNTER — Other Ambulatory Visit: Payer: Self-pay

## 2021-06-27 VITALS — BP 124/81 | HR 90 | Temp 97.1°F | Resp 20

## 2021-06-27 DIAGNOSIS — C50411 Malignant neoplasm of upper-outer quadrant of right female breast: Secondary | ICD-10-CM | POA: Diagnosis not present

## 2021-06-27 MED ORDER — PEGFILGRASTIM-BMEZ 6 MG/0.6ML ~~LOC~~ SOSY
6.0000 mg | PREFILLED_SYRINGE | Freq: Once | SUBCUTANEOUS | Status: AC
  Administered 2021-06-27: 6 mg via SUBCUTANEOUS

## 2021-06-27 NOTE — Progress Notes (Signed)
Pt here for Neulasta injection.  Stated feeling well.  Able to eat and drink without problems.  Asked pt if she would like to receive IVF as per order.  Pt declined  x 2.  Pt tolerated injection without difficulty.  Pt was stable at discharge via self ambulation.

## 2021-06-29 ENCOUNTER — Other Ambulatory Visit: Payer: Self-pay | Admitting: *Deleted

## 2021-06-29 ENCOUNTER — Ambulatory Visit: Payer: Self-pay | Admitting: Genetic Counselor

## 2021-06-29 ENCOUNTER — Telehealth: Payer: Self-pay | Admitting: *Deleted

## 2021-06-29 DIAGNOSIS — Z170 Estrogen receptor positive status [ER+]: Secondary | ICD-10-CM

## 2021-06-29 DIAGNOSIS — C50411 Malignant neoplasm of upper-outer quadrant of right female breast: Secondary | ICD-10-CM

## 2021-06-29 NOTE — Telephone Encounter (Signed)
This RN spoke with pt per her VM stating she has developed a mild intermittent cough most notable " when I lay down".  She denies any chills or fevers.  This RN discussed above could be related to possible gastric irritation caused by the steroids or possible nasal drainage just due to seasonal changes.  This RN discussed benefit of laying more in a slightly elevated position as well as use of pepcid.  Presently the patient states she is " outside walking... just felt like what I needed to do - I got like a feeling of sitting on the edge of my seat "  This RN discussed above again is a " nervous energy " that is induced by the steroids - discusses how it gives an energy but that often it is not a usable energy but more interfering at can border on a sense of panic.  Ebony Lamb verbalized " that is exactly how I feel "- " I can't concentrate but want to do something "  This RN discussed use of the lorazepam/ativan ( she uses at night ) and that she can break a tablet in 1/2 to use during the day for benefit.  Pt verbalized understanding.  Of note she has passed additional stool.  [name] is scheduled for follow on 10/19 but understands to call tomorrow if symptoms not controlled well.  This RN also asked that she keep a diary of these issues so at her visit they can be discussed for possible " tweaking " of medications that decrease chemo side effects but give her unwanted other issues.  Pt verbalized understanding.

## 2021-06-29 NOTE — Progress Notes (Addendum)
HPI:   Ms. Ackroyd was previously seen in the [place] clinic due to a personal and family history of cancer and concerns regarding a hereditary predisposition to cancer. Please refer to our prior cancer genetics clinic note for more information regarding our discussion, assessment and recommendations, at the time. Ms. Aguado recent genetic test results were disclosed to her, as were recommendations warranted by these results. These results and recommendations are discussed in more detail below.  CANCER HISTORY:  Oncology History  Malignant neoplasm of upper-outer quadrant of right breast in female, estrogen receptor positive ([place])  06/08/2021 Initial Diagnosis   Malignant neoplasm of upper-outer quadrant of right breast in female, estrogen receptor positive ([place])   06/10/2021 Cancer Staging   Staging form: Breast, AJCC 8th Edition - Clinical stage from 06/10/2021: Stage IIA (cT2, cN0, cM0, G3, ER+, PR+, HER2-) - Signed by Chauncey Cruel, MD on 06/10/2021 Stage prefix: Initial diagnosis Histologic grading system: 3 grade system Laterality: Right Staged by: Pathologist and managing physician Stage used in treatment planning: Yes National guidelines used in treatment planning: Yes Type of national guideline used in treatment planning: NCCN   06/16/2021 Genetic Testing   [facility] CancerNext-Expanded was negative. No pathogenic variants were identified. 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 Report date is 06/19/2021.  The CancerNext-Expanded gene panel offered by [facility] and includes sequencing, rearrangement, and RNA analysis for the following 77 genes: AIP, ALK, APC, ATM, AXIN2, BAP1, BARD1, BLM, BMPR1A, BRCA1, BRCA2, BRIP1, CDC73, CDH1, CDK4, CDKN1B, CDKN2A, CHEK2, CTNNA1, DICER1, FANCC, FH, FLCN, GALNT12, KIF1B, LZTR1, MAX, MEN1, MET, MLH1, MSH2, MSH3, MSH6, MUTYH, NBN, NF1, NF2, NTHL1, PALB2, PHOX2B, PMS2, POT1, PRKAR1A, PTCH1, PTEN,  RAD51C, RAD51D, RB1, RECQL, RET, SDHA, SDHAF2, SDHB, SDHC, SDHD, SMAD4, SMARCA4, SMARCB1, SMARCE1, STK11, SUFU, TMEM127, TP53, TSC1, TSC2, VHL and XRCC2 (sequencing and deletion/duplication); EGFR, EGLN1, HOXB13, KIT, MITF, PDGFRA, POLD1, and POLE (sequencing only); EPCAM and GREM1 (deletion/duplication only).    06/25/2021 -  Chemotherapy   Patient is on Treatment Plan : BREAST ADJUVANT DOSE DENSE AC q14d / PACLitaxel q7d      FAMILY HISTORY:  We obtained a detailed, 4-generation family history.  Significant diagnoses are listed below:        Family History  Problem Relation Age of Onset   Breast cancer Mother 42   Other Mother          BRCA2 gene mutation   Melanoma Father 90   Breast cancer Maternal Grandmother          dx. late 30s/40s   Other Maternal Grandfather          BRCA2 gene mutation   Ovarian cancer Paternal Grandmother          dx. 28s          Ms. Chirino mother was diagnosed with breast cancer at age 24 and was found to have a BRCA2 mutation. Her maternal grandfather is an obligate carrier because several of her mother's paternal cousins tested positive for the familial BRCA2 mutation. Ms. Albany reports that she had negative genetic testing through [facility] for her mother's BRCA2 gene mutation. Of note, we do not have her genetic test results nor do we have her mother's genetic test results. Ms. Shad maternal grandmother was diagnosed with breast cancer in her late 39s/40s, she died due to cancer metastasis. Her father was diagnosed with melanoma at 68. Her paternal grandmother was diagnosed with ovarian cancer in her  77s, she died in her early 36s due to cancer metastasis. There no reported Ashkenazi Jewish ancestry.   GENETIC TEST RESULTS:  The [facility] CancerNext-Expanded (77 genes) Panel found no pathogenic mutations.  The CancerNext-Expanded gene panel offered by [facility] and includes sequencing, rearrangement, and RNA analysis for the following 77 genes: AIP,  ALK, APC, ATM, AXIN2, BAP1, BARD1, BLM, BMPR1A, BRCA1, BRCA2, BRIP1, CDC73, CDH1, CDK4, CDKN1B, CDKN2A, CHEK2, CTNNA1, DICER1, FANCC, FH, FLCN, GALNT12, KIF1B, LZTR1, MAX, MEN1, MET, MLH1, MSH2, MSH3, MSH6, MUTYH, NBN, NF1, NF2, NTHL1, PALB2, PHOX2B, PMS2, POT1, PRKAR1A, PTCH1, PTEN, RAD51C, RAD51D, RB1, RECQL, RET, SDHA, SDHAF2, SDHB, SDHC, SDHD, SMAD4, SMARCA4, SMARCB1, SMARCE1, STK11, SUFU, TMEM127, TP53, TSC1, TSC2, VHL and XRCC2 (sequencing and deletion/duplication); EGFR, EGLN1, HOXB13, KIT, MITF, PDGFRA, POLD1, and POLE (sequencing only); EPCAM and GREM1 (deletion/duplication only).    The test report has been scanned into EPIC and is located under the Molecular Pathology section of the Results Review tab.  A portion of the result report is included below for reference. Genetic testing reported out on 06/19/2021.       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  At this time, it is unknown if this variant is associated with increased risk for cancer or if it is benign, but most uncertain variants are reclassified to benign. It should not be used to make medical management decisions. With time, we suspect the laboratory will determine the significance of this variant, if any. If the laboratory reclassifies this variant, we will contact Ms. Geving to discuss it further.   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 Report date is May 2023.  Even though a pathogenic variant was not identified, possible explanations for her personal history of cancer may include: Ms. Zahn's breast cancer may be due to other genetic or environmental factors. There may be a gene mutation in one of these genes that current testing methods cannot detect, but that chance is small. There could be another gene that has not yet been discovered, or that we have not yet tested, that is responsible for the cancer diagnoses in the family.   Therefore, it  is important to remain in touch with cancer genetics in the future so that we can continue to offer Ms. Collazos the most up to date genetic testing.   ADDITIONAL GENETIC TESTING:  We discussed with Ms. Ellerbe that her genetic testing was fairly extensive.  If there are genes identified to increase cancer risk that can be analyzed in the future, we would be happy to discuss and coordinate this testing at that time.    CANCER SCREENING RECOMMENDATIONS:  Ms. Cassity test result is considered negative (normal).  This means that we have not identified a hereditary cause for her personal history of cancer. An individual's cancer risk and medical management are not determined by genetic test results alone. Overall cancer risk assessment incorporates additional factors, including personal medical history, family history, and any available genetic information that may result in a personalized plan for cancer prevention and surveillance. Therefore, it is recommended she continue to follow the cancer management and screening guidelines provided by her oncology and primary healthcare provider.  RECOMMENDATIONS FOR FAMILY MEMBERS:   Since she did not inherit a mutation in a cancer predisposition gene included on this panel, her children could not have inherited a mutation from her in one of these genes. Her daughter is recommended to begin annual mammograms at age  42 (10 years prior to Ms. Hardacre's age at diagnosis). Other members of the family may still carry a pathogenic variant in one of these genes that Ms. Glowacki did not inherit. Based on the family history, we recommend her father have genetic counseling and testing. Ms. Ante will let us know if we can be of any assistance in coordinating genetic counseling and/or testing for this family member.   We do not recommend familial testing for the PTCH1 variant of uncertain significance (VUS).  FOLLOW-UP:  Cancer genetics is a rapidly advancing field and it is possible that  new genetic tests will be appropriate for her and/or her family members in the future. We encourage her to remain in contact with cancer genetics on an annual basis so we can update her personal and family histories and let her know of advances in cancer genetics that may benefit this family.   Our contact number was provided. Ms. Mccarter questions were answered to her satisfaction, and she knows she is welcome to call us at anytime with additional questions or concerns.   [name], MS, [facility] Genetic Counselor [email or web address] (P) [phone]

## 2021-06-30 ENCOUNTER — Ambulatory Visit (HOSPITAL_COMMUNITY)
Admission: RE | Admit: 2021-06-30 | Discharge: 2021-06-30 | Disposition: A | Discharge status: Home / Self Care | Payer: No Typology Code available for payment source | Source: Ambulatory Visit | Attending: Oncology | Admitting: Oncology

## 2021-06-30 ENCOUNTER — Encounter: Payer: Self-pay | Admitting: *Deleted

## 2021-06-30 ENCOUNTER — Telehealth: Payer: Self-pay | Admitting: *Deleted

## 2021-06-30 ENCOUNTER — Other Ambulatory Visit: Payer: Self-pay

## 2021-06-30 ENCOUNTER — Inpatient Hospital Stay (HOSPITAL_BASED_OUTPATIENT_CLINIC_OR_DEPARTMENT_OTHER): Payer: No Typology Code available for payment source | Admitting: Adult Health

## 2021-06-30 ENCOUNTER — Other Ambulatory Visit: Payer: Self-pay | Admitting: *Deleted

## 2021-06-30 ENCOUNTER — Inpatient Hospital Stay: Payer: No Typology Code available for payment source

## 2021-06-30 VITALS — BP 138/83 | HR 91 | Temp 99.4°F | Resp 18 | Ht 66.0 in | Wt 126.2 lb

## 2021-06-30 DIAGNOSIS — R051 Acute cough: Secondary | ICD-10-CM

## 2021-06-30 DIAGNOSIS — Z170 Estrogen receptor positive status [ER+]: Secondary | ICD-10-CM | POA: Diagnosis present

## 2021-06-30 DIAGNOSIS — K641 Second degree hemorrhoids: Secondary | ICD-10-CM | POA: Diagnosis not present

## 2021-06-30 DIAGNOSIS — C50411 Malignant neoplasm of upper-outer quadrant of right female breast: Secondary | ICD-10-CM

## 2021-06-30 LAB — CMP (CANCER CENTER ONLY)
ALT: 14 U/L (ref 0–44)
AST: 16 U/L (ref 15–41)
Albumin: 4 g/dL (ref 3.5–5.0)
Alkaline Phosphatase: 85 U/L (ref 38–126)
Anion gap: 11 (ref 5–15)
BUN: 9 mg/dL (ref 6–20)
CO2: 25 mmol/L (ref 22–32)
Calcium: 8.9 mg/dL (ref 8.9–10.3)
Chloride: 102 mmol/L (ref 98–111)
Creatinine: 0.65 mg/dL (ref 0.44–1.00)
GFR, Estimated: >60 mL/min (ref >60)
Glucose, Bld: 103 mg/dL — ABNORMAL HIGH (ref 70–99)
Potassium: 3.8 mmol/L (ref 3.5–5.1)
Sodium: 138 mmol/L (ref 135–145)
Total Bilirubin: 0.7 mg/dL (ref 0.3–1.2)
Total Protein: 6.8 g/dL (ref 6.5–8.1)

## 2021-06-30 LAB — CBC WITH DIFFERENTIAL (CANCER CENTER ONLY)
Abs Immature Granulocytes: 0 10*3/uL (ref 0.00–0.07)
Band Neutrophils: 1 %
Basophils Absolute: 0 10*3/uL (ref 0.0–0.1)
Basophils Relative: 0 %
Eosinophils Absolute: 0.2 10*3/uL (ref 0.0–0.5)
Eosinophils Relative: 1 %
HCT: 38.1 % (ref 36.0–46.0)
Hemoglobin: 12.8 g/dL (ref 12.0–15.0)
Lymphocytes Relative: 17 %
Lymphs Abs: 2.8 10*3/uL (ref 0.7–4.0)
MCH: 31 pg (ref 26.0–34.0)
MCHC: 33.6 g/dL (ref 30.0–36.0)
MCV: 92.3 fL (ref 80.0–100.0)
Monocytes Absolute: 0.2 10*3/uL (ref 0.1–1.0)
Monocytes Relative: 1 %
Neutro Abs: 13.4 10*3/uL — ABNORMAL HIGH (ref 1.7–7.7)
Neutrophils Relative %: 80 %
Platelet Count: 202 10*3/uL (ref 150–400)
RBC: 4.13 MIL/uL (ref 3.87–5.11)
RDW: 11.3 % — ABNORMAL LOW (ref 11.5–15.5)
WBC Count: 16.6 10*3/uL — ABNORMAL HIGH (ref 4.0–10.5)
nRBC: 0 % (ref 0.0–0.2)

## 2021-06-30 LAB — PREGNANCY, URINE: Preg Test, Ur: NEGATIVE

## 2021-06-30 IMAGING — DX DG HIP (WITH OR WITHOUT PELVIS) 3-4V BILAT
5 series · 5 of 5 positions shown · non-contrast
Comparison: None.

CLINICAL DATA: Abnormal bone scan.  Breast cancer.

EXAM:
DG HIP (WITH OR WITHOUT PELVIS) 3-4V BILAT

[pelvis ap]
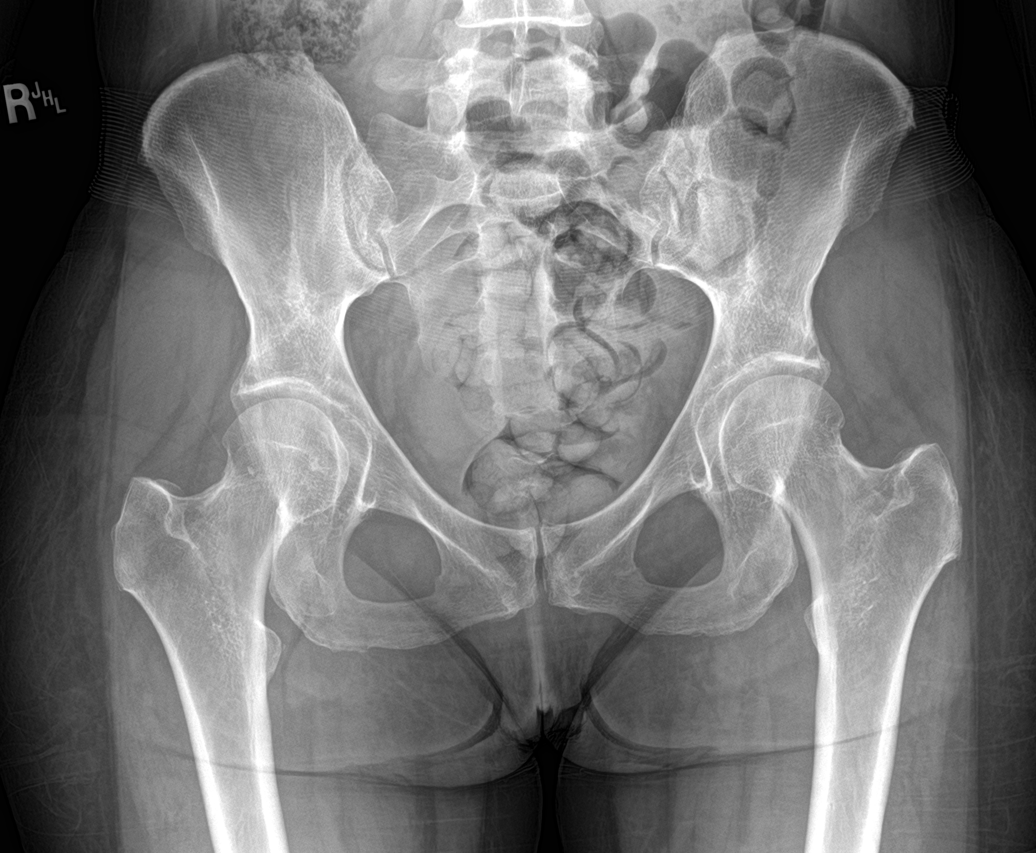

[hip ap (1 of 2)]
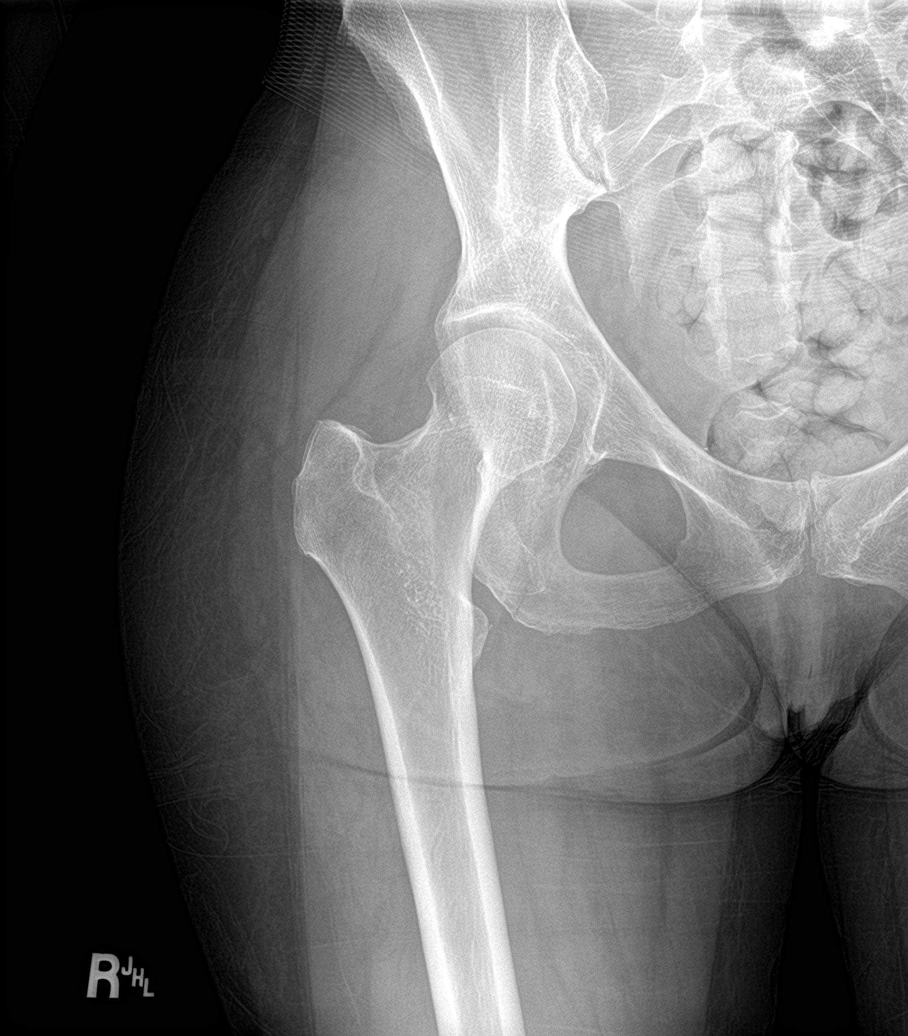

[hip lat (1 of 2)]
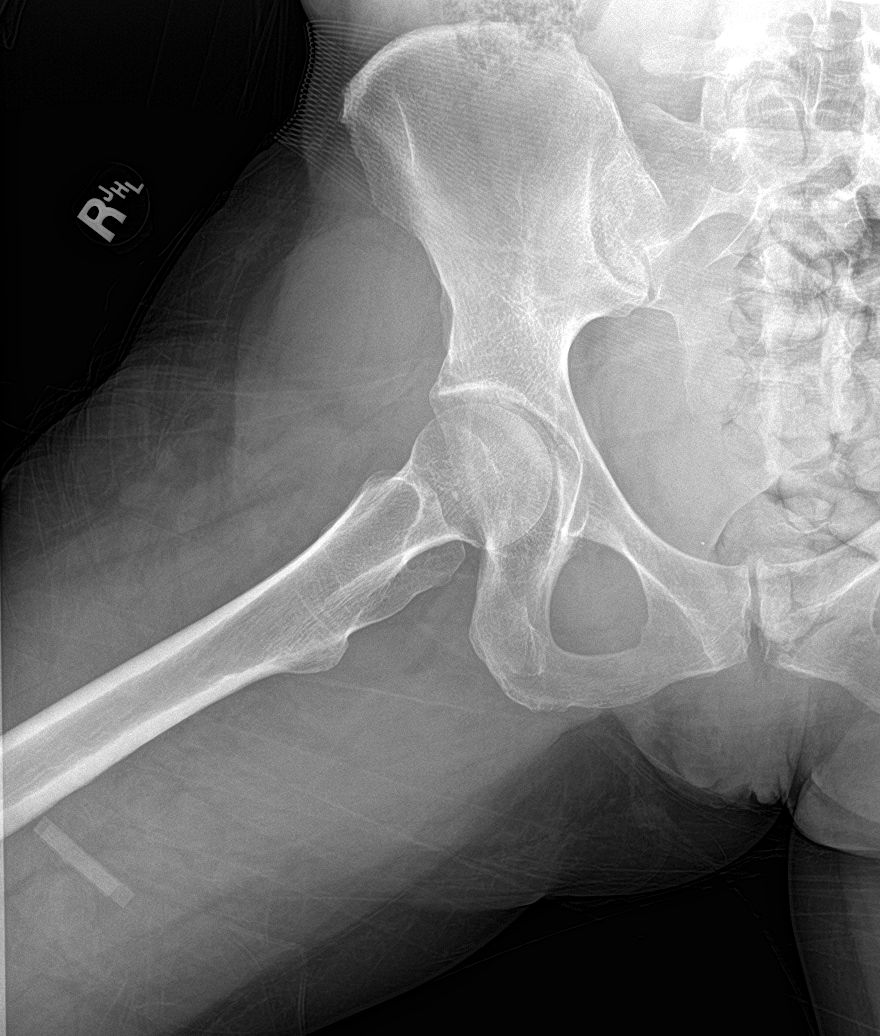

[hip ap (2 of 2)]
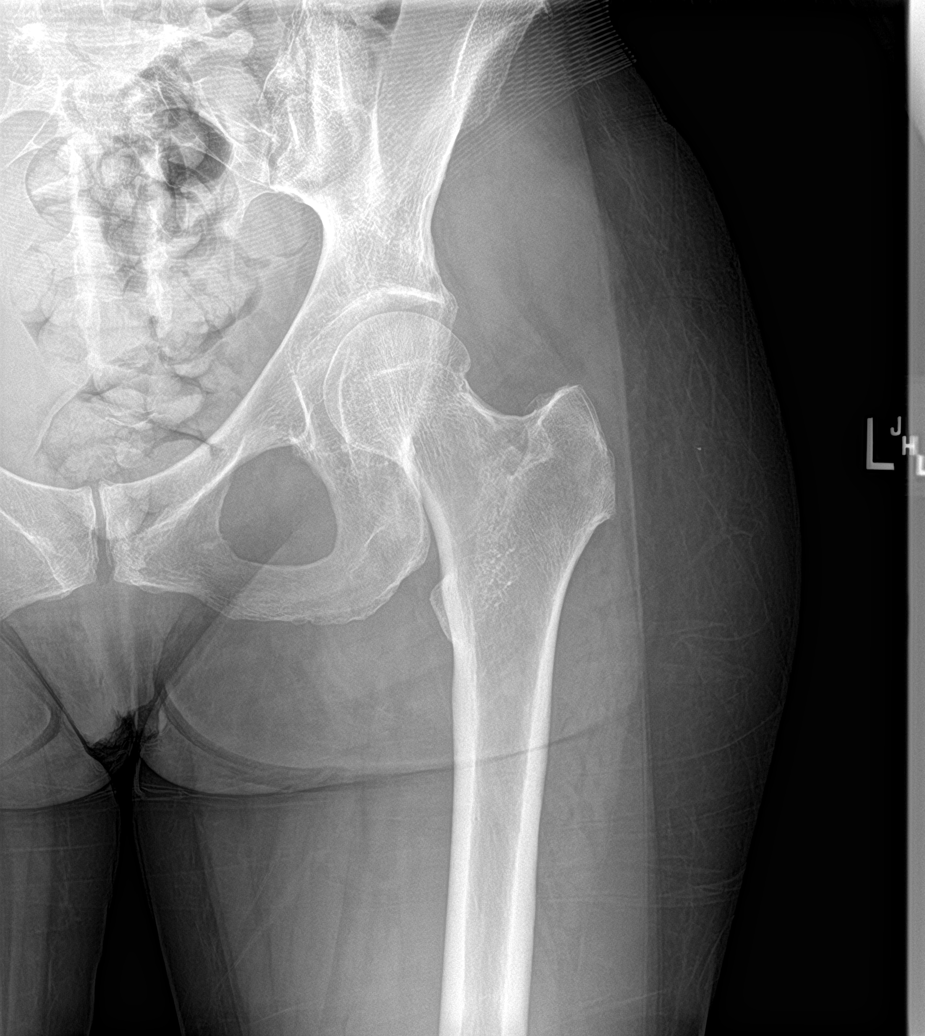

[hip lat (2 of 2)]
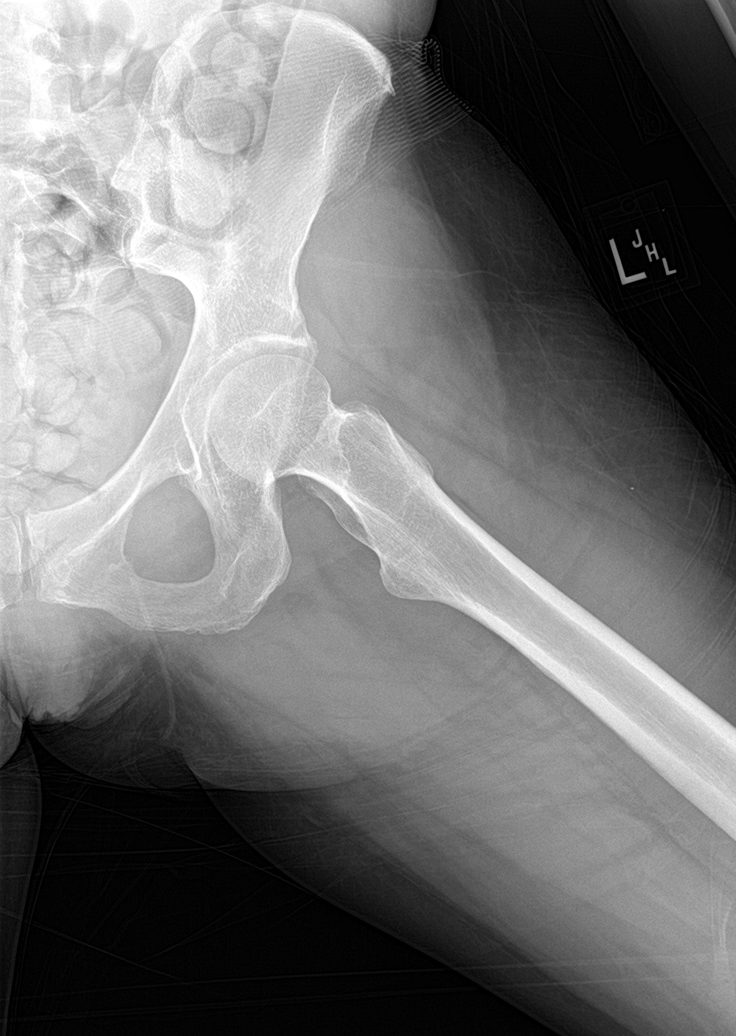

[5 of 5 positions shown; findings below may reference images not displayed]

FINDINGS: There is no evidence of hip fracture or dislocation. There is no
evidence of arthropathy or other focal bone abnormality.
IMPRESSION: Negative.

## 2021-06-30 IMAGING — DX DG CHEST 2V
2 series · 2 of 2 positions shown · non-contrast
Comparison: Chest x-ray [DATE].

CLINICAL DATA: New onset of cough, CANISI suppressed.

EXAM:
CHEST - 2 VIEW

[chest pa]
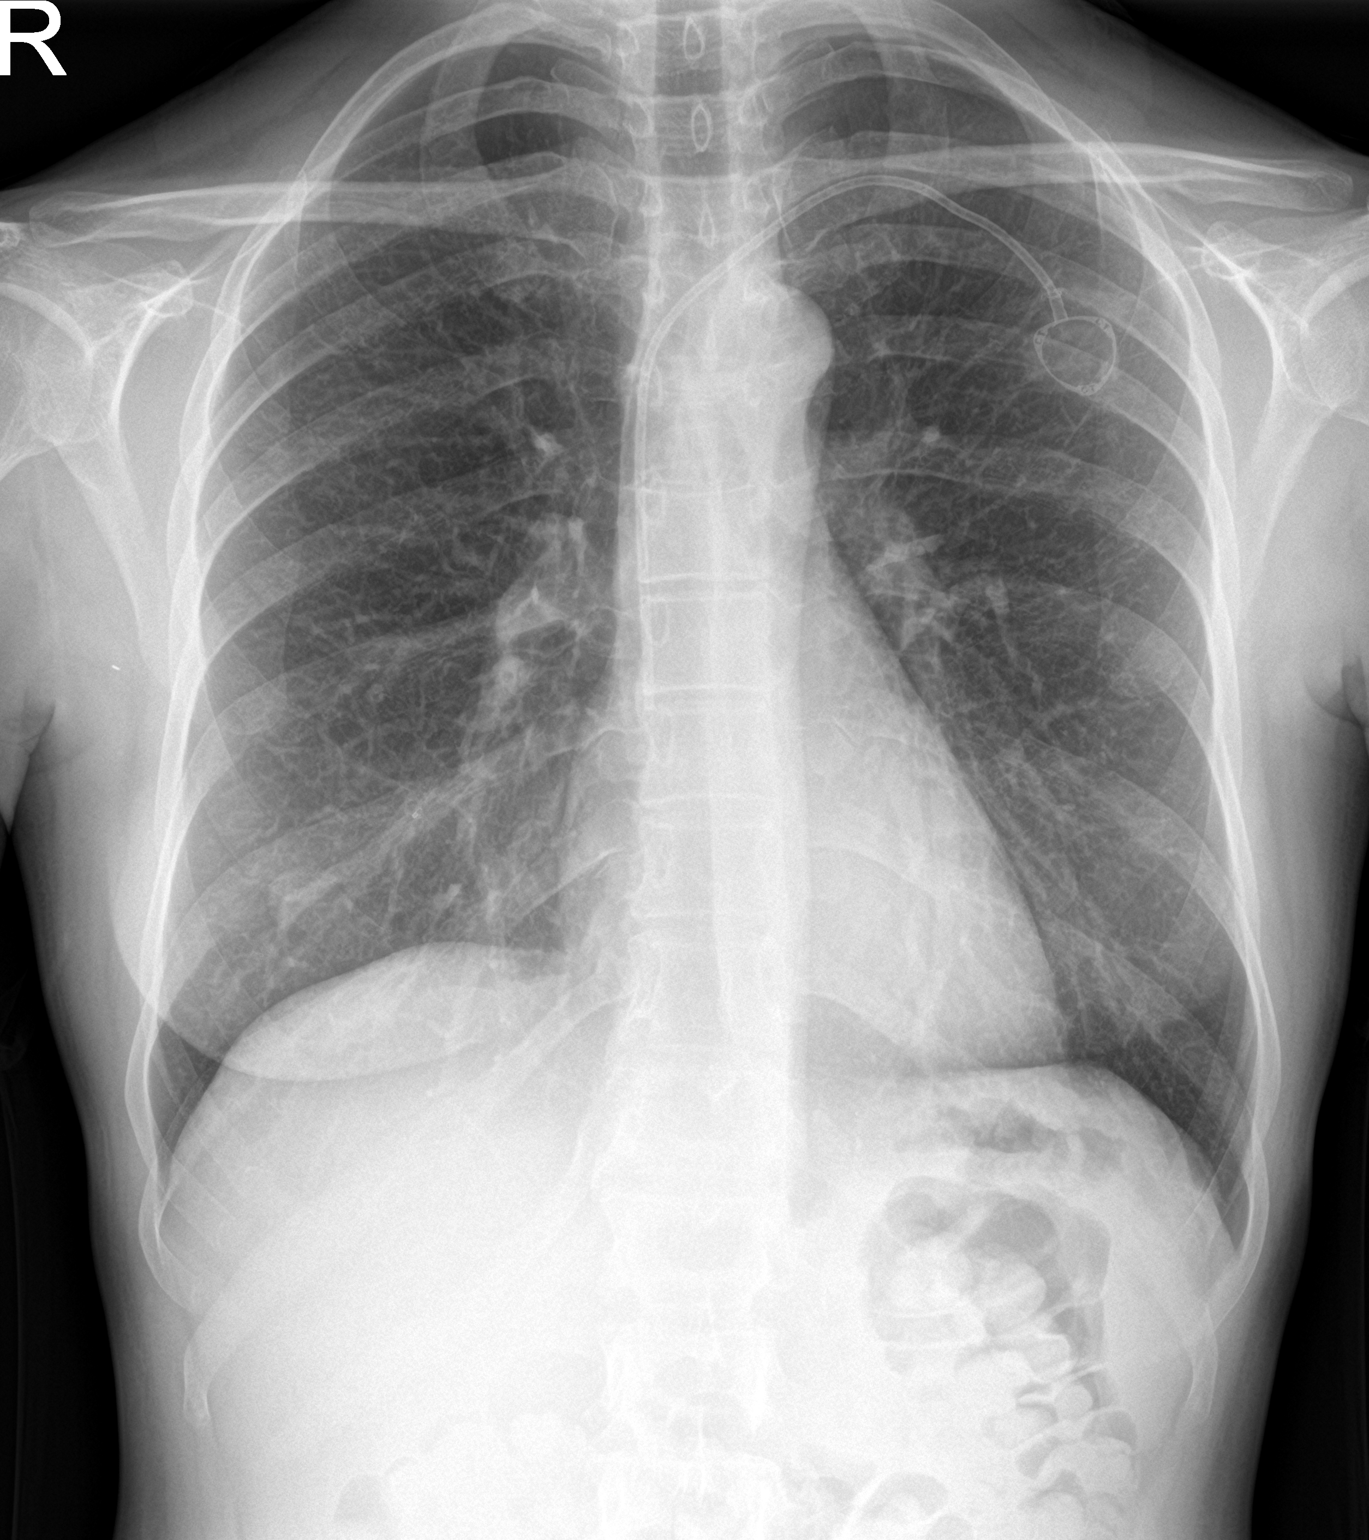

[chest lat]
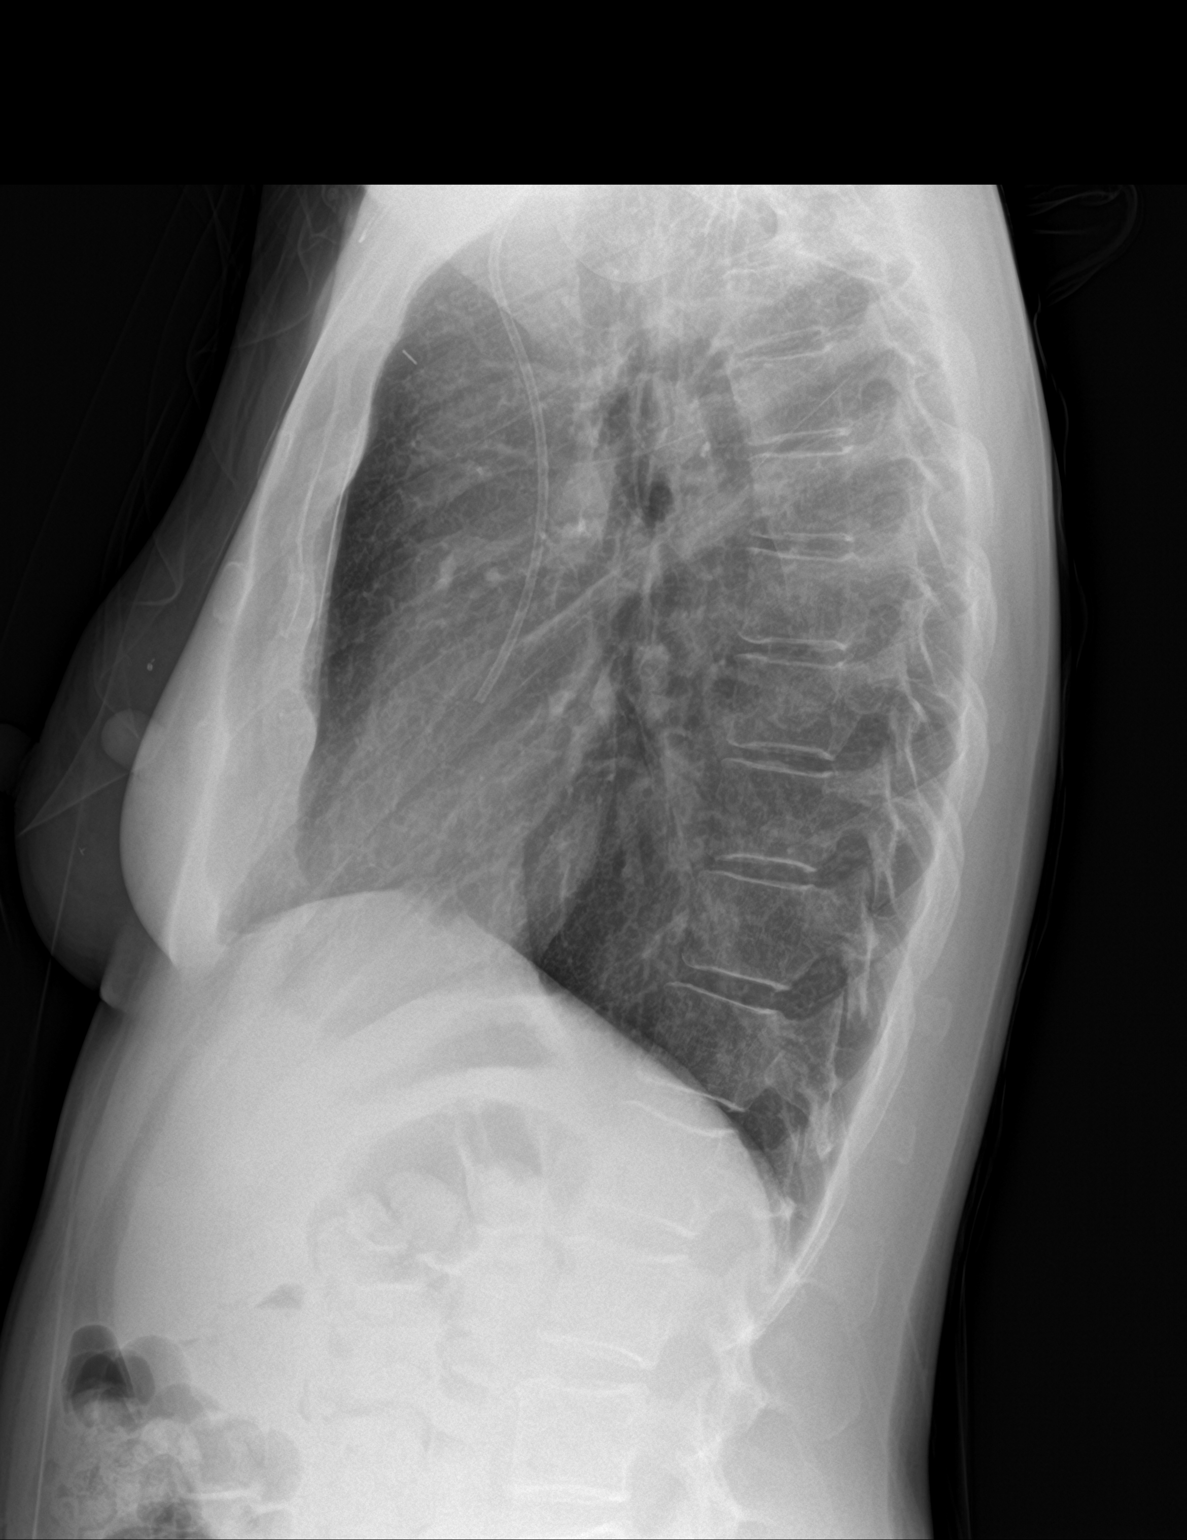

[2 of 2 positions shown; findings below may reference images not displayed]

FINDINGS: Left chest port catheter tip projects over the SVC, unchanged. The
heart size and mediastinal contours are within normal limits. Both
lungs are clear. The visualized skeletal structures are
unremarkable.
IMPRESSION: No active cardiopulmonary disease.

## 2021-06-30 MED ORDER — PRAMOXINE-HC 1-2.5 % EX CREA: TOPICAL_CREAM | Freq: Three times a day (TID) | CUTANEOUS | 0 refills | Status: DC

## 2021-06-30 MED ORDER — DEXAMETHASONE 4 MG PO TABS: 4.0000 mg | ORAL_TABLET | Freq: Two times a day (BID) | ORAL | 1 refills | Status: DC

## 2021-06-30 MED ORDER — DOXYCYCLINE HYCLATE 100 MG PO TABS: 100.0000 mg | ORAL_TABLET | Freq: Two times a day (BID) | ORAL | 0 refills | Status: DC

## 2021-06-30 MED ORDER — ALBUTEROL SULFATE HFA 108 (90 BASE) MCG/ACT IN AERS: 2.0000 | INHALATION_SPRAY | Freq: Four times a day (QID) | RESPIRATORY_TRACT | 2 refills | Status: DC | PRN

## 2021-06-30 NOTE — Progress Notes (Signed)
[facility] CSW Progress Notes  Social Work Intern called patient and left generic VM, following up from [facility] Left contact info for patient to return call if she would like.  Rosary Lively, BSW Intern Supervised by Gwinda [place], LCSW

## 2021-06-30 NOTE — Patient Instructions (Signed)
Pick up Colace from your pharmacy.  Drink plenty of water.  Use the inhaler twice a day.    Call us if you worsen, or if things don't improve.    Acute Bronchitis, Adult Acute bronchitis is sudden or acute swelling of the air tubes (bronchi) in the lungs. Acute bronchitis causes these tubes to fill with mucus, which can make it hard to breathe. It can also cause coughing or wheezing. In adults, acute bronchitis usually goes away within 2 weeks. A cough caused by bronchitis may last up to 3 weeks. Smoking, allergies, and asthma can make the condition worse. What are the causes? This condition can be caused by germs and by substances that irritate the lungs, including: Cold and flu viruses. The most common cause of this condition is the virus that causes the common cold. Bacteria. Substances that irritate the lungs, including: Smoke from cigarettes and other forms of tobacco. Dust and pollen. Fumes from chemical products, gases, or burned fuel. Other materials that pollute indoor or outdoor air. Close contact with someone who has acute bronchitis. What increases the risk? The following factors may make you more likely to develop this condition: A weak body's defense system, also called the immune system. A condition that affects your lungs and breathing, such as asthma. What are the signs or symptoms? Common symptoms of this condition include: Lung and breathing problems, such as: Coughing. This may bring up clear, yellow, or green mucus from your lungs (sputum). Wheezing. Having too much mucus in your lungs (chest congestion). Having shortness of breath. A fever. Chills. Aches and pains, including: Tightness in your chest and other body aches. A sore throat. How is this diagnosed? This condition is usually diagnosed based on: Your symptoms and medical history. A physical exam. You may also have other tests, including tests to rule out other conditions, such as pneumonia. These  tests include: A test of lung function. Test of a mucus sample to look for the presence of bacteria. Tests to check the oxygen level in your blood. Blood tests. Chest X-ray. How is this treated? Most cases of acute bronchitis clear up over time without treatment. Your health care provider may recommend: Drinking more fluids. This can thin your mucus, which may improve your breathing. Using a device that gets medicine into your lungs (inhaler) to help improve breathing and control coughing. Using a vaporizer or a humidifier. These are machines that add water to the air to help you breathe better. Taking a medicine for a fever. Taking a medicine that thins mucus and clears congestion (expectorant). Taking a medicine that prevents or stops coughing (cough suppressant). Follow these instructions at home: Activity Get plenty of rest. Return to your normal activities as told by your health care provider. Ask your health care provider what activities are safe for you. Lifestyle  Drink enough fluid to keep your urine pale yellow. Do not drink alcohol. Do not use any products that contain nicotine or tobacco, such as cigarettes, e-cigarettes, and chewing tobacco. If you need help quitting, ask your health care provider. Be aware that: Your bronchitis will get worse if you smoke or breathe in other people's smoke (secondhand smoke). Your lungs will heal faster if you quit smoking. General instructions Take over-the-counter and prescription medicines only as told by your health care provider. Use an inhaler, vaporizer, or humidifier as told by your health care provider. If you have a sore throat, gargle with a salt-water mixture 3-4 times a day or as  needed. To make a salt-water mixture, completely dissolve -1 tsp (3-6 g) of salt in 1 cup (237 mL) of warm water. Take two teaspoons of honey at bedtime to lessen coughing at night. Keep all follow-up visits as told by your health care provider. This  is important. How is this prevented? To lower your risk of getting this condition again: Wash your hands often with soap and water. If soap and water are not available, use hand sanitizer. Avoid contact with people who have cold symptoms. Try not to touch your mouth, nose, or eyes with your hands. Avoid places where there are fumes from chemicals. Breathing these fumes will make your condition worse. Get the flu shot every year. Contact a health care provider if: Your symptoms do not improve after 2 weeks of treatment. You vomit more than once or twice. You have symptoms of dehydration such as: Dark urine. Dry skin or eyes. Increased thirst. Headaches. Confusion. Muscle cramps. Get help right away if you: Cough up blood. Feel pain in your chest. Have severe shortness of breath. Faint or keep feeling like you are going to faint. Have a severe headache. Have fever or chills that get worse. These symptoms may represent a serious problem that is an emergency. Do not wait to see if the symptoms will go away. Get medical help right away. Call your local emergency services (911 in the [place] Do not drive yourself to the hospital. Summary Acute bronchitis is sudden (acute) inflammation of the air tubes (bronchi) between the windpipe and the lungs. In adults, acute bronchitis usually goes away within 2 weeks, although coughing may last 3 weeks or longer. Take over-the-counter and prescription medicines only as told by your health care provider. Drink enough fluid to keep your urine pale yellow. Contact a health care provider if your symptoms do not improve after 2 weeks of treatment. Get help right away if you cough up blood, faint, or have chest pain or shortness of breath. This information is not intended to replace advice given to you by your health care provider. Make sure you discuss any questions you have with your health care provider. Document Revised: 07/30/2020 Document Reviewed:  03/23/2019 Elsevier Patient Education  [place].

## 2021-06-30 NOTE — Telephone Encounter (Signed)
This RN was able to speak with patient again post her visit with her GYN.  Ebony Lamb had left a VM stating cough seemed to be getting worse- she state " my GYN listened and said they sounded clear "  Pt coughed spontaneously while on the phone with noted deep congested cough.  She states cough " feels mucousy " but is non productive.  She states she took her temp and it was 100.2 and then rechecked it and it was 98.2.  She states concern due to symptoms and " just concerned about if it worsens overnight "  This RN scheduled pt for CXR lab and visit today.

## 2021-06-30 NOTE — Progress Notes (Signed)
[facility]  [phone] Fax:([phone]) [phone]     ID: Ebony Lamb DOB: 1976-05-25  MR#: [id number]  PQA#:[id number]  Patient Care Team: Ebony Contras, MD as PCP - General (Family Medicine) Ebony Keens, MD as Consulting Physician (General Surgery) Ebony Lamb, Ebony Dad, MD as Consulting Physician (Oncology) Ebony Gibson, MD as Attending Physician (Radiation Oncology) Ebony Germany, RN as Oncology Nurse Navigator Ebony Kaufmann, RN as Oncology Nurse Navigator Ebony Nunnery, MD as Consulting Physician (Otolaryngology) Ebony Kava, MD as Referring Physician (Obstetrics and Gynecology) Ebony Presume, MD as Consulting Physician (Plastic Surgery) Ebony Dock, NP OTHER MD:  CHIEF COMPLAINT: Weakly estrogen receptor positive breast cancer  CURRENT TREATMENT: Neoadjuvant chemotherapy   INTERVAL HISTORY: Ebony Lamb returns today for follow up and treatment of her weakly estrogen receptor positive breast cancer.  She began on neoadjuvant chemotherapy last week with dose dense doxorubicin and cyclophosphamide.  She tolerated this treatment moderatley well.  She had a loose bm after treatment and took one imodium.  She had subsequent constipation resulting in hemorrhoids that are uncomfortable for her.    She also notes a blurred vision that occurred last week and just improved today.  She developed a cough yesterday and nasal drainage.  She has had mild low grade fever.  She denies chills, nausea, vomiting, shortness of breath, pleuritic chest pain, wheezing.  Her cough is non productive.    REVIEW OF SYSTEMS: Review of Systems  Constitutional:  Positive for fatigue. Negative for appetite change, chills, fever and unexpected weight change.  HENT:   Negative for hearing loss, lump/mass, mouth sores and trouble swallowing.   Eyes:  Negative for eye problems and icterus.  Respiratory:  Positive for cough. Negative for chest tightness and shortness of breath.    Cardiovascular:  Negative for chest pain, leg swelling and palpitations.  Gastrointestinal:  Negative for abdominal distention, abdominal pain, constipation, diarrhea, nausea and vomiting.  Endocrine: Negative for hot flashes.  Genitourinary:  Negative for difficulty urinating.   Musculoskeletal:  Negative for arthralgias.  Skin:  Negative for itching and rash.  Neurological:  Negative for dizziness, extremity weakness, headaches and numbness.  Hematological:  Negative for adenopathy. Does not bruise/bleed easily.  Psychiatric/Behavioral:  Negative for depression. The patient is not nervous/anxious.       COVID 19 VACCINATION STATUS: Pfizer x3; infection 01/2021   HISTORY OF CURRENT ILLNESS: From the original intake note:  Ebony Lamb (pronounced "REECE") has a history of previous right breast biopsy (8 o'clock) in 07/2019 that was negative.  More recently she presented with a palpable right breast mass at 12 o'clock. She underwent right diagnostic mammography with tomography and right breast ultrasonography at [facility] on 06/01/2021 showing: breast density category D; palpable 3 cm irregular mass in right breast at 12 o'clock; possible 5 mm satellite lesion located 2.5 cm from dominant mass; abnormal right axillary lymph node.  Accordingly on 06/04/2021 she proceeded to biopsy of the right breast area in question. The pathology from this procedure ([id number]) showed: invasive ductal carcinoma, grade 3. Prognostic indicators significant for: estrogen receptor, 15% positive and progesterone receptor, 15% positive, both with weak-moderate staining intensity. Proliferation marker Ki67 at 70%. HER2 equivocal by immunohistochemistry (2+), but negative by fluorescent in situ hybridization with a signals ratio 1.47 and number per cell 2.50.  Biopsy of the questionable lymph node was negative and concordant  Cancer Staging Malignant neoplasm of upper-outer quadrant of right breast in  female, estrogen receptor  positive ([place]) Staging form: Breast, AJCC 8th Edition - Clinical stage from 06/10/2021: Stage IIA (cT2, cN0, cM0, G3, ER+, PR+, HER2-) - Signed by Ebony Cruel, MD on 06/10/2021 Stage prefix: Initial diagnosis Histologic grading system: 3 grade system Laterality: Right Staged by: Pathologist and managing physician Stage used in treatment planning: Yes National guidelines used in treatment planning: Yes Type of national guideline used in treatment planning: NCCN  The patient's subsequent history is as detailed below.   PAST MEDICAL HISTORY: Past Medical History:  Diagnosis Date   Abnormal Pap smear 09/13/2000   Allergy 02/2020   Seasonal   Cancer ([place])    Depression    history of depression- denies currently   H/O varicella    Hypertension 09/14/2011   post partum- meds x 1 mo   No pertinent past medical history    Preterm labor     PAST SURGICAL HISTORY: Past Surgical History:  Procedure Laterality Date   BREAST BIOPSY Right 07/27/2019   CESAREAN SECTION  11/14/2011   Procedure: CESAREAN SECTION;  Surgeon: Ebony Hose, MD;  Location: [facility];  Service: Gynecology;  Laterality: N/A;  Primary cesarean section with delivery of baby boy at [phone]. Apgars 9/9.   CESAREAN SECTION N/A 03/22/2014   Procedure: Repeat CESAREAN SECTION;  Surgeon: Ebony Pea, MD;  Location: [facility];  Service: Obstetrics;  Laterality: N/A;   COSMETIC SURGERY  11/22/2017   Abdominoplasty   FOOT SURGERY     FRACTURE SURGERY  08/1999   Repair of 5th metatarsal in left foot   HERNIA REPAIR  11/22/2017   PORTACATH PLACEMENT N/A 06/24/2021   Procedure: INSERTION PORT-A-CATH;  Surgeon: Ebony Keens, MD;  Location: [facility];  Service: General;  Laterality: N/A;   TUBAL LIGATION  11/22/2017   Tubes were completely removed   WISDOM TOOTH EXTRACTION      FAMILY HISTORY: Family History  Problem Relation Age of Onset   Breast cancer Mother 56    Osteopenia Mother    Arthritis Mother    Other Mother        BRCA2 gene mutation   Hypertension Father    Kidney disease Father    Depression Father    Hyperlipidemia Father    Melanoma Father 36   Hypertension Brother    Hyperlipidemia Brother    Breast cancer Maternal Grandmother        dx. late 30s/40s   Heart disease Maternal Grandfather    Other Maternal Grandfather        BRCA2 gene mutation   Ovarian cancer Paternal Grandmother        dx. 43s   Stroke Paternal Grandfather    Birth defects Son    Anesthesia problems Neg Hx    Diabetes Neg Hx    Her parents are both living, her father age 66 and her mother age 90 as of 05/2021. Cathie has one brother (and no sisters). She reports breast cancer in her mother at age 66 and her maternal grandmother in her late 78's, melanoma in her father at age 29, and ovarian cancer in her paternal grandmother in her 79's.   GYNECOLOGIC HISTORY:  Patient's last menstrual period was 06/06/2021. Menarche: 45 years old Age at first live birth: 45 years old [place] P 2 LMP 06/06/2021 Contraceptive: used from 1996-2003 and 2006-2012 HRT n/a  Hysterectomy? no BSO? 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 Husband [name] work for [employer] in [employer]. She lives at home with [name] and their two children-- Tiffany Kocher, age 7, and Cori Razor, age 53. 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    ADVANCED DIRECTIVES: in place   HEALTH MAINTENANCE: Social History   Tobacco Use   Smoking status: Never   Smokeless tobacco: Never  Vaping Use   Vaping Use: Never used  Substance Use Topics   Alcohol use: Yes    Alcohol/week: 7.0 standard drinks    Types: 7 Standard drinks or equivalent per week    Comment: I stopped drinking alcohol after finding the lump in my brea   Drug use: No     Colonoscopy: never done (age)  PAP: 06/2020  Bone density: never done (age)   No Known  Allergies  Current Outpatient Medications  Medication Sig Dispense Refill   dexamethasone (DECADRON) 4 MG tablet Take 2 tablets (8 mg total) by mouth daily. Take daily for 3 days after chemo. Take with food. 30 tablet 1   lidocaine-prilocaine (EMLA) cream Apply to affected area once 30 g 3   loratadine (CLARITIN) 10 MG tablet Take 1 tablet (10 mg total) by mouth daily. Take day post chemo and then daily for 10 days with each treatment 30 tablet 3   LORazepam (ATIVAN) 0.5 MG tablet Take 1 tablet (0.5 mg total) by mouth at bedtime as needed (Nausea or vomiting). 30 tablet 0   prochlorperazine (COMPAZINE) 10 MG tablet Take 1 tablet (10 mg total) by mouth every 6 (six) hours as needed (Nausea or vomiting). 30 tablet 1   traMADol (ULTRAM) 50 MG tablet Take 1-2 tablets (50-100 mg total) by mouth every 6 (six) hours as needed. 20 tablet 0   No current facility-administered medications for this visit.   Facility-Administered Medications Ordered in Other Visits  Medication Dose Route Frequency Provider Last Rate Last Admin   influenza vac split quadrivalent PF (FLUARIX) 0.5 ML injection            influenza vac split quadrivalent PF (FLUARIX) injection 0.5 mL  0.5 mL Intramuscular Once Ebony Lamb, Ebony Dad, MD        OBJECTIVE: White woman who appears stated age  Vitals:   06/30/21 1503  BP: 138/83  Pulse: 91  Resp: 18  Temp: 99.4 F (37.4 C)  SpO2: 97%      Body mass index is 20.37 kg/m.   Wt Readings from Last 3 Encounters:  06/30/21 126 lb 3.2 oz (57.2 kg)  06/25/21 126 lb 1.6 oz (57.2 kg)  06/24/21 123 lb 10.9 oz (56.1 kg)     ECOG FS:1 - Symptomatic but completely ambulatory GENERAL: Patient is a well appearing female in no acute distress HEENT:  Sclerae anicteric.  Oropharynx clear and moist. No ulcerations or evidence of oropharyngeal candidiasis. Neck is supple. No sinus tenderness noted.   NODES:  No cervical, supraclavicular, or axillary lymphadenopathy palpated.  BREAST EXAM:   Deferred. LUNGS:  Clear to auscultation bilaterally.  No wheezes or rhonchi. HEART:  Regular rate and rhythm. No murmur appreciated. ABDOMEN:  Soft, nontender.  Positive, normoactive bowel sounds. No organomegaly palpated. MSK:  No focal spinal tenderness to palpation.  EXTREMITIES:  No peripheral edema.   SKIN:  Clear with no obvious rashes or skin changes. No nail dyscrasia. NEURO:  Nonfocal. Well oriented.  Appropriate affect.   LAB RESULTS:  CMP     Component Value Date/Time   NA 135 06/25/2021 0757   K 3.3 (  L) 06/25/2021 0757   CL 106 06/25/2021 0757   CO2 22 06/25/2021 0757   GLUCOSE 117 (H) 06/25/2021 0757   BUN 6 06/25/2021 0757   CREATININE 0.55 06/25/2021 0757   CALCIUM 8.7 (L) 06/25/2021 0757   PROT 6.5 06/25/2021 0757   ALBUMIN 3.9 06/25/2021 0757   AST 19 06/25/2021 0757   ALT 13 06/25/2021 0757   ALKPHOS 41 06/25/2021 0757   BILITOT 0.8 06/25/2021 0757   GFRNONAA >60 06/25/2021 0757   GFRAA >90 11/26/2011 2008    No results found for: TOTALPROTELP, ALBUMINELP, A1GS, A2GS, BETS, BETA2SER, GAMS, MSPIKE, SPEI  Lab Results  Component Value Date   WBC 16.6 (H) 06/30/2021   NEUTROABS PENDING 06/30/2021   HGB 12.8 06/30/2021   HCT 38.1 06/30/2021   MCV 92.3 06/30/2021   PLT 202 06/30/2021    No results found for: LABCA2  No components found for: FXTKWI097  No results for input(s): INR in the last 168 hours.  No results found for: LABCA2  No results found for: DZH299  No results found for: MEQ683  No results found for: MHD622  No results found for: CA2729  No components found for: HGQUANT  No results found for: CEA1 / No results found for: CEA1   No results found for: AFPTUMOR  No results found for: CHROMOGRNA  No results found for: KPAFRELGTCHN, LAMBDASER, KAPLAMBRATIO (kappa/lambda light chains)  No results found for: HGBA, HGBA2QUANT, HGBFQUANT, HGBSQUAN (Hemoglobinopathy evaluation)   Lab Results  Component Value Date   LDH 223  11/26/2011    No results found for: IRON, TIBC, IRONPCTSAT (Iron and TIBC)  No results found for: FERRITIN  Urinalysis    Component Value Date/Time   COLORURINE YELLOW 11/26/2011 2000   APPEARANCEUR CLEAR 11/26/2011 2000   LABSPEC <1.005 (L) 11/26/2011 2000   PHURINE 6.5 11/26/2011 2000   GLUCOSEU NEGATIVE 11/26/2011 2000   HGBUR SMALL (A) 11/26/2011 2000   BILIRUBINUR NEGATIVE 11/26/2011 2000   KETONESUR NEGATIVE 11/26/2011 2000   PROTEINUR NEGATIVE 11/26/2011 2000   UROBILINOGEN 0.2 11/26/2011 2000   NITRITE NEGATIVE 11/26/2011 2000   LEUKOCYTESUR MODERATE (A) 11/26/2011 2000    STUDIES:    ELIGIBLE FOR AVAILABLE RESEARCH PROTOCOL: no  ASSESSMENT: 45 y.o. [place] woman status post right breast upper outer quadrant biopsy 06/04/2021 for a clinical mT2 N0 invasive ductal carcinoma, grade 3, estrogen and progesterone receptor weakly positive, HER2 not amplified, with an MIB-1 of 70%  (A) chest CT with contrast 06/22/2021 shows right breast mass, no other lesions of concern; pulmonary nodules all less than 5 mm noted, likely benign  (B) bone scan 06/22/2021 negative for metastases; consistent with sacroiliitis (1) genetics testing: (A) [facility] BRCAplus dated 06/16/2021 tensions in ATM, BRCA1, BRCA2, CDH1, CHEK2, PALB2, PTEN, and TP53.  (2) neoadjuvant chemotherapy consisting of doxorubicin and cyclophosphamide in dose dense fashion x4 beginning 06/25/2021, followed by weekly paclitaxel x12  (3) definitive surgery to follow  (4) adjuvant radiation as appropriate  (5) antiestrogens  PLAN:  Keaghan is here today for f/u and evaluation after receiving her first cycle of neoadjuvant chemotherapy.  She has had some difficulty following chemotherapy listed numerically below.    Hemorrhoids.  I sent in pramoxine-hydrocortisone cream for her to use.  I recommended that she use colace and increased water intake to help soften her stool.   Cough.  I sent in doxycycline bid for her  to take along with with an albuterol inhaler.  Chest xray ordered and [facility] will get this  after her appt today.   Blurred vision: this is likely due to her steroid dosing. For her next cycle she will take dexamethasone 29m po bid instead of 825mpo bid.  This will likely help with her feeling on edge after taking it also.    ErAunestiill return in 1 week for labs, f/u and her treatment.  She knows to call for any questions that may arise between now and her next appointment.  We are happy to see her sooner if needed.   Total encounter time 30 minutes* in face-to-face visit time, chart review, lab review, order entry, care coordination, and documentaiton of the encounter.    LiWilber BihariNP 06/30/21 3:10 PM Medical Oncology and Hematology [facility]4[place] [id number]. [phone]  Fax. [phone]  *Total Encounter Time as defined by the Centers for Medicare and Medicaid Services includes, in addition to the face-to-face time of a patient visit (documented in the note above) non-face-to-face time: obtaining and reviewing outside history, ordering and reviewing medications, tests or procedures, care coordination (communications with other health care professionals or caregivers) and documentation in the medical record.

## 2021-07-01 ENCOUNTER — Ambulatory Visit (HOSPITAL_BASED_OUTPATIENT_CLINIC_OR_DEPARTMENT_OTHER): Payer: No Typology Code available for payment source | Admitting: Oncology

## 2021-07-01 ENCOUNTER — Encounter: Payer: Self-pay | Admitting: Adult Health

## 2021-07-01 ENCOUNTER — Encounter: Payer: Self-pay | Admitting: *Deleted

## 2021-07-01 ENCOUNTER — Encounter: Payer: Self-pay | Admitting: Oncology

## 2021-07-01 ENCOUNTER — Other Ambulatory Visit: Payer: No Typology Code available for payment source

## 2021-07-01 DIAGNOSIS — C50411 Malignant neoplasm of upper-outer quadrant of right female breast: Secondary | ICD-10-CM

## 2021-07-01 DIAGNOSIS — Z170 Estrogen receptor positive status [ER+]: Secondary | ICD-10-CM

## 2021-07-01 NOTE — Progress Notes (Signed)
Ebony Lamb was scheduled to see me today 07/01/2021 but she came yesterday and saw my nurse practitioner.  This appointment accordingly should have been canceled.

## 2021-07-07 ENCOUNTER — Encounter: Payer: Self-pay | Admitting: *Deleted

## 2021-07-07 NOTE — Progress Notes (Signed)
[facility] CSW Progress Notes  Social Work Intern spoke to patient by phone as a follow up call from [facility]. Pt reports that she has reached out to a psychologist who was recommended and accepts her insurance, and plans to begin therapy sessions soon.   Patient has received info sent by intern in the mail and does not currently qualify for any of the financial assistance resources that were shared. However, pt did receive a call from financial counseling about [facility] and will follow up by phone.   Patient also received resources in the mail re supporting children whose parent has cancer, and has not had bandwidth to follow up yet. Intern encouraged pt that it is okay to prioritize her own care right now as she is beginning treatment and adjusting to her diagnosis. Intern and patient also discussed importance of asking for help when needed.  Patient will contact CSW with any future needs.  Rosary Lively, Social Work Intern Supervised by Gwinda Maine, LCSW

## 2021-07-09 ENCOUNTER — Inpatient Hospital Stay: Payer: No Typology Code available for payment source

## 2021-07-09 ENCOUNTER — Encounter: Payer: Self-pay | Admitting: Oncology

## 2021-07-09 ENCOUNTER — Other Ambulatory Visit: Payer: Self-pay

## 2021-07-09 ENCOUNTER — Inpatient Hospital Stay (HOSPITAL_BASED_OUTPATIENT_CLINIC_OR_DEPARTMENT_OTHER): Payer: No Typology Code available for payment source | Admitting: Oncology

## 2021-07-09 ENCOUNTER — Ambulatory Visit: Payer: No Typology Code available for payment source | Admitting: Dietician

## 2021-07-09 VITALS — BP 123/77 | HR 70 | Temp 97.9°F | Resp 16 | Ht 66.0 in | Wt 121.3 lb

## 2021-07-09 DIAGNOSIS — Z170 Estrogen receptor positive status [ER+]: Secondary | ICD-10-CM

## 2021-07-09 DIAGNOSIS — Z95828 Presence of other vascular implants and grafts: Secondary | ICD-10-CM

## 2021-07-09 DIAGNOSIS — C50411 Malignant neoplasm of upper-outer quadrant of right female breast: Secondary | ICD-10-CM

## 2021-07-09 LAB — CMP (CANCER CENTER ONLY)
ALT: 18 U/L (ref 0–44)
AST: 18 U/L (ref 15–41)
Albumin: 3.9 g/dL (ref 3.5–5.0)
Alkaline Phosphatase: 91 U/L (ref 38–126)
Anion gap: 9 (ref 5–15)
BUN: 7 mg/dL (ref 6–20)
CO2: 21 mmol/L — ABNORMAL LOW (ref 22–32)
Calcium: 8.8 mg/dL — ABNORMAL LOW (ref 8.9–10.3)
Chloride: 110 mmol/L (ref 98–111)
Creatinine: 0.67 mg/dL (ref 0.44–1.00)
GFR, Estimated: >60 mL/min (ref >60)
Glucose, Bld: 144 mg/dL — ABNORMAL HIGH (ref 70–99)
Potassium: 3.7 mmol/L (ref 3.5–5.1)
Sodium: 140 mmol/L (ref 135–145)
Total Bilirubin: 0.3 mg/dL (ref 0.3–1.2)
Total Protein: 6.5 g/dL (ref 6.5–8.1)

## 2021-07-09 LAB — CBC WITH DIFFERENTIAL (CANCER CENTER ONLY)
Abs Immature Granulocytes: 0.23 10*3/uL — ABNORMAL HIGH (ref 0.00–0.07)
Basophils Absolute: 0.1 10*3/uL (ref 0.0–0.1)
Basophils Relative: 1 %
Eosinophils Absolute: 0 10*3/uL (ref 0.0–0.5)
Eosinophils Relative: 0 %
HCT: 36.4 % (ref 36.0–46.0)
Hemoglobin: 12.4 g/dL (ref 12.0–15.0)
Immature Granulocytes: 2 %
Lymphocytes Relative: 17 %
Lymphs Abs: 2.6 10*3/uL (ref 0.7–4.0)
MCH: 31 pg (ref 26.0–34.0)
MCHC: 34.1 g/dL (ref 30.0–36.0)
MCV: 91 fL (ref 80.0–100.0)
Monocytes Absolute: 0.5 10*3/uL (ref 0.1–1.0)
Monocytes Relative: 3 %
Neutro Abs: 11.6 10*3/uL — ABNORMAL HIGH (ref 1.7–7.7)
Neutrophils Relative %: 77 %
Platelet Count: 230 10*3/uL (ref 150–400)
RBC: 4 MIL/uL (ref 3.87–5.11)
RDW: 11.4 % — ABNORMAL LOW (ref 11.5–15.5)
WBC Count: 14.9 10*3/uL — ABNORMAL HIGH (ref 4.0–10.5)
nRBC: 0 % (ref 0.0–0.2)

## 2021-07-09 LAB — PREGNANCY, URINE: Preg Test, Ur: NEGATIVE

## 2021-07-09 MED ORDER — HEPARIN SOD (PORK) LOCK FLUSH 100 UNIT/ML IV SOLN
500.0000 [IU] | Freq: Once | INTRAVENOUS | Status: AC | PRN
  Administered 2021-07-09: 500 [IU]

## 2021-07-09 MED ORDER — SODIUM CHLORIDE 0.9 % IV SOLN
150.0000 mg | Freq: Once | INTRAVENOUS | Status: AC
  Administered 2021-07-09: 150 mg via INTRAVENOUS
  Filled 2021-07-09: qty 150

## 2021-07-09 MED ORDER — SODIUM CHLORIDE 0.9 % IV SOLN
600.0000 mg/m2 | Freq: Once | INTRAVENOUS | Status: AC
  Administered 2021-07-09: 980 mg via INTRAVENOUS
  Filled 2021-07-09: qty 49

## 2021-07-09 MED ORDER — DOXORUBICIN HCL CHEMO IV INJECTION 2 MG/ML
60.0000 mg/m2 | Freq: Once | INTRAVENOUS | Status: AC
  Administered 2021-07-09: 98 mg via INTRAVENOUS
  Filled 2021-07-09: qty 49

## 2021-07-09 MED ORDER — PALONOSETRON HCL INJECTION 0.25 MG/5ML
0.2500 mg | Freq: Once | INTRAVENOUS | Status: AC
  Administered 2021-07-09: 0.25 mg via INTRAVENOUS
  Filled 2021-07-09: qty 5

## 2021-07-09 MED ORDER — SODIUM CHLORIDE 0.9% FLUSH
10.0000 mL | INTRAVENOUS | Status: DC | PRN
  Administered 2021-07-09: 10 mL via INTRAVENOUS

## 2021-07-09 MED ORDER — SODIUM CHLORIDE 0.9 % IV SOLN: Freq: Once | INTRAVENOUS | Status: AC

## 2021-07-09 MED ORDER — SODIUM CHLORIDE 0.9% FLUSH
10.0000 mL | INTRAVENOUS | Status: DC | PRN
  Administered 2021-07-09: 10 mL

## 2021-07-09 MED ORDER — SODIUM CHLORIDE 0.9 % IV SOLN
10.0000 mg | Freq: Once | INTRAVENOUS | Status: AC
  Administered 2021-07-09: 10 mg via INTRAVENOUS
  Filled 2021-07-09: qty 10

## 2021-07-09 NOTE — Patient Instructions (Signed)
[facility] ONCOLOGY  Discharge Instructions: Thank you for choosing [facility] to provide your oncology and hematology care.   If you have a lab appointment with the [facility], please go directly to the [facility] and check in at the registration area.   Wear comfortable clothing and clothing appropriate for easy access to any Portacath or PICC line.   We strive to give you quality time with your provider. You may need to reschedule your appointment if you arrive late (15 or more minutes).  Arriving late affects you and other patients whose appointments are after yours.  Also, if you miss three or more appointments without notifying the office, you may be dismissed from the clinic at the provider's discretion.      For prescription refill requests, have your pharmacy contact our office and allow 72 hours for refills to be completed.    Today you received the following chemotherapy and/or immunotherapy agents: Adriamycin & Cytoxan   To help prevent nausea and vomiting after your treatment, we encourage you to take your nausea medication as directed.  BELOW ARE SYMPTOMS THAT SHOULD BE REPORTED IMMEDIATELY: *FEVER GREATER THAN 100.4 F (38 C) OR HIGHER *CHILLS OR SWEATING *NAUSEA AND VOMITING THAT IS NOT CONTROLLED WITH YOUR NAUSEA MEDICATION *UNUSUAL SHORTNESS OF BREATH *UNUSUAL BRUISING OR BLEEDING *URINARY PROBLEMS (pain or burning when urinating, or frequent urination) *BOWEL PROBLEMS (unusual diarrhea, constipation, pain near the anus) TENDERNESS IN MOUTH AND THROAT WITH OR WITHOUT PRESENCE OF ULCERS (sore throat, sores in mouth, or a toothache) UNUSUAL RASH, SWELLING OR PAIN  UNUSUAL VAGINAL DISCHARGE OR ITCHING   Items with * indicate a potential emergency and should be followed up as soon as possible or go to the Emergency Department if any problems should occur.  Please show the CHEMOTHERAPY ALERT CARD or IMMUNOTHERAPY ALERT CARD at  check-in to the Emergency Department and triage nurse.  Should you have questions after your visit or need to cancel or reschedule your appointment, please contact [facility]  Dept: [phone]  and follow the prompts.  Office hours are 8:00 a.m. to 4:30 p.m. Monday - Friday. Please note that voicemails left after 4:00 p.m. may not be returned until the following business day.  We are closed weekends and major holidays. You have access to a nurse at all times for urgent questions. Please call the main number to the clinic Dept: [phone] and follow the prompts.   For any non-urgent questions, you may also contact your provider using MyChart. We now offer e-Visits for anyone 41 and older to request care online for non-urgent symptoms. For details visit [email or web address].   Also download the MyChart app! Go to the app store, search "MyChart", open the app, select [facility], and log in with your MyChart username and password.  Due to Covid, a mask is required upon entering the hospital/clinic. If you do not have a mask, one will be given to you upon arrival. For doctor visits, patients may have 1 support person aged 11 or older with them. For treatment visits, patients cannot have anyone with them due to current Covid guidelines and our immunocompromised population.

## 2021-07-09 NOTE — Progress Notes (Signed)
[place]  [phone] [phone]     ID: Ebony Lamb DOB: 1976-03-14  MR#: [id number]  KYH#:[id number]  Patient Care Team: Antony Contras, MD as PCP - General (Family Medicine) Coralie Keens, MD as Consulting Physician (General Surgery) Blimi Godby, Virgie Dad, MD as Consulting Physician (Oncology) Eppie Gibson, MD as Attending Physician (Radiation Oncology) Rockwell Germany, RN as Oncology Nurse Navigator Mauro Kaufmann, RN as Oncology Nurse Navigator Rozetta Nunnery, MD as Consulting Physician (Otolaryngology) Sanjuana Kava, MD as Referring Physician (Obstetrics and Gynecology) [name], MD as Consulting Physician (Plastic Surgery) Chauncey Cruel, MD OTHER MD:  CHIEF COMPLAINT: Weakly estrogen receptor positive breast cancer  CURRENT TREATMENT: Neoadjuvant chemotherapy   INTERVAL HISTORY: Ebony Lamb returns today for follow up and treatment of her weakly estrogen receptor positive breast cancer.  She is accompanied by her husband Ebony Lamb  She started neoadjuvant chemotherapy 06/25/2021 with doxorubicin and cyclophosphamide.  She returns today for cycle 2.  REVIEW OF SYSTEMS: Ebony Lamb did generally well with her first cycle.  Her nadir counts were excellent and normally would not have needed antibiotics but she developed a mild fever and cough and was started on doxycycline..  She did have a loose bowel movement after her chemo and then significant constipation.  She had a mild cough and some blurred vision.  She tolerated the pegfilgrastim well, feeling more unsettled than anything else and probably the worst part of it all was the steroids, which kept her up all night and made her feel very anxious and restless.  She was able to keep her self well-hydrated.  She did not develop problems with mouth sores and her port is working well.   COVID 19 VACCINATION STATUS: Pfizer x3; infection 01/2021   HISTORY OF CURRENT ILLNESS: From the original  intake note:  Ebony Lamb (pronounced "REECE") has a history of previous right breast biopsy (8 o'clock) in 07/2019 that was negative.  More recently she presented with a palpable right breast mass at 12 o'clock. She underwent right diagnostic mammography with tomography and right breast ultrasonography at [facility] on 06/01/2021 showing: breast density category D; palpable 3 cm irregular mass in right breast at 12 o'clock; possible 5 mm satellite lesion located 2.5 cm from dominant mass; abnormal right axillary lymph node.  Accordingly on 06/04/2021 she proceeded to biopsy of the right breast area in question. The pathology from this procedure ([id number]) showed: invasive ductal carcinoma, grade 3. Prognostic indicators significant for: estrogen receptor, 15% positive and progesterone receptor, 15% positive, both with weak-moderate staining intensity. Proliferation marker Ki67 at 70%. HER2 equivocal by immunohistochemistry (2+), but negative by fluorescent in situ hybridization with a signals ratio 1.47 and number per cell 2.50.  Biopsy of the questionable lymph node was negative and concordant  Cancer Staging Malignant neoplasm of upper-outer quadrant of right breast in female, estrogen receptor positive ([place]) Staging form: Breast, AJCC 8th Edition - Clinical stage from 06/10/2021: Stage IIA (cT2, cN0, cM0, G3, ER+, PR+, HER2-) - Signed by Chauncey Cruel, MD on 06/10/2021 Stage prefix: Initial diagnosis Histologic grading system: 3 grade system Laterality: Right Staged by: Pathologist and managing physician Stage used in treatment planning: Yes National guidelines used in treatment planning: Yes Type of national guideline used in treatment planning: NCCN  The patient's subsequent history is as detailed below.   PAST MEDICAL HISTORY: Past Medical History:  Diagnosis Date   Abnormal Pap smear 09/13/2000   Allergy 02/2020   Seasonal  Cancer [facility])    Depression    history of  depression- denies currently   H/O varicella    Hypertension 09/14/2011   post partum- meds x 1 mo   No pertinent past medical history    Preterm labor     PAST SURGICAL HISTORY: Past Surgical History:  Procedure Laterality Date   BREAST BIOPSY Right 07/27/2019   CESAREAN SECTION  11/14/2011   Procedure: CESAREAN SECTION;  Surgeon: Eli Hose, MD;  Location: [facility];  Service: Gynecology;  Laterality: N/A;  Primary cesarean section with delivery of baby boy at [id number]. Apgars 9/9.   CESAREAN SECTION N/A 03/22/2014   Procedure: Repeat CESAREAN SECTION;  Surgeon: Alwyn Pea, MD;  Location: [facility];  Service: Obstetrics;  Laterality: N/A;   COSMETIC SURGERY  11/22/2017   Abdominoplasty   FOOT SURGERY     FRACTURE SURGERY  08/1999   Repair of 5th metatarsal in left foot   HERNIA REPAIR  11/22/2017   PORTACATH PLACEMENT N/A 06/24/2021   Procedure: INSERTION PORT-A-CATH;  Surgeon: Coralie Keens, MD;  Location: [facility];  Service: General;  Laterality: N/A;   TUBAL LIGATION  11/22/2017   Tubes were completely removed   WISDOM TOOTH EXTRACTION      FAMILY HISTORY: Family History  Problem Relation Age of Onset   Breast cancer Mother 69   Osteopenia Mother    Arthritis Mother    Other Mother        BRCA2 gene mutation   Hypertension Father    Kidney disease Father    Depression Father    Hyperlipidemia Father    Melanoma Father 69   Hypertension Brother    Hyperlipidemia Brother    Breast cancer Maternal Grandmother        dx. late 30s/40s   Heart disease Maternal Grandfather    Other Maternal Grandfather        BRCA2 gene mutation   Ovarian cancer Paternal Grandmother        dx. 1s   Stroke Paternal Grandfather    Birth defects Son    Anesthesia problems Neg Hx    Diabetes Neg Hx    Her parents are both living, her father age 25 and her mother age 13 as of 05/2021. Ebony Lamb has one brother (and no sisters). She reports breast cancer in her mother  at age 73 and her maternal grandmother in her late 17's, melanoma in her father at age 44, and ovarian cancer in her paternal grandmother in her 51's.   GYNECOLOGIC HISTORY:  No LMP recorded. Menarche: 45 years old Age at first live birth: 45 years old [place] P 2 LMP 06/06/2021 Contraceptive: used from 1996-2003 and 2006-2012 HRT n/a  Hysterectomy? no BSO? 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 Husband Ebony Lamb work for [employer] in [employer]. She lives at home with Ebony Lamb and their two children-- Tiffany Kocher, age 67, and Cori Razor, age 76. 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    ADVANCED DIRECTIVES: in place   HEALTH MAINTENANCE: Social History   Tobacco Use   Smoking status: Never   Smokeless tobacco: Never  Vaping Use   Vaping Use: Never used  Substance Use Topics   Alcohol use: Yes    Alcohol/week: 7.0 standard drinks    Types: 7 Standard drinks or equivalent per week    Comment: I stopped drinking alcohol after finding the lump  in my brea   Drug use: No     Colonoscopy: never done (age)  PAP: 06/2020  Bone density: never done (age)   No Known Allergies  Current Outpatient Medications  Medication Sig Dispense Refill   albuterol (VENTOLIN HFA) 108 (90 Base) MCG/ACT inhaler Inhale 2 puffs into the lungs every 6 (six) hours as needed for wheezing or shortness of breath. 8 g 2   dexamethasone (DECADRON) 4 MG tablet Take 1 tablet (4 mg total) by mouth 2 (two) times daily. Take daily for 3 days after chemo. Take with food. 30 tablet 1   doxycycline (VIBRA-TABS) 100 MG tablet Take 1 tablet (100 mg total) by mouth 2 (two) times daily. 10 tablet 0   lidocaine-prilocaine (EMLA) cream Apply to affected area once 30 g 3   loratadine (CLARITIN) 10 MG tablet Take 1 tablet (10 mg total) by mouth daily. Take day post chemo and then daily for 10 days with each treatment 30 tablet 3   LORazepam (ATIVAN) 0.5 MG tablet  Take 1 tablet (0.5 mg total) by mouth at bedtime as needed (Nausea or vomiting). 30 tablet 0   pramoxine-hydrocortisone cream Apply topically 3 (three) times daily. 57 g 0   prochlorperazine (COMPAZINE) 10 MG tablet Take 1 tablet (10 mg total) by mouth every 6 (six) hours as needed (Nausea or vomiting). 30 tablet 1   traMADol (ULTRAM) 50 MG tablet Take 1-2 tablets (50-100 mg total) by mouth every 6 (six) hours as needed. 20 tablet 0   No current facility-administered medications for this visit.   Facility-Administered Medications Ordered in Other Visits  Medication Dose Route Frequency Provider Last Rate Last Admin   influenza vac split quadrivalent PF (FLUARIX) 0.5 ML injection            influenza vac split quadrivalent PF (FLUARIX) injection 0.5 mL  0.5 mL Intramuscular Once Meila Berke, Virgie Dad, MD       sodium chloride flush (NS) 0.9 % injection 10 mL  10 mL Intracatheter PRN Littie Chiem, Virgie Dad, MD   10 mL at 07/09/21 1600    OBJECTIVE: White woman who appears stated age  45:   07/09/21 1305  BP: 123/77  Pulse: 70  Resp: 16  Temp: 97.9 F (36.6 C)  SpO2: 100%       Body mass index is 19.58 kg/m.   Wt Readings from Last 3 Encounters:  07/09/21 121 lb 4.8 oz (55 kg)  06/30/21 126 lb 3.2 oz (57.2 kg)  06/25/21 126 lb 1.6 oz (57.2 kg)     ECOG FS:1 - Symptomatic but completely ambulatory  Sclerae unicteric, EOMs intact Wearing a mask No cervical or supraclavicular adenopathy Lungs no rales or rhonchi Heart regular rate and rhythm Abd soft, nontender, positive bowel sounds MSK no focal spinal tenderness, no upper extremity lymphedema Neuro: nonfocal, well oriented, appropriate affect Breasts: I do not palpate a well-defined mass in the right breast.  The left breast is benign.  Both axillae are benign   LAB RESULTS:  CMP     Component Value Date/Time   NA 140 07/09/2021 1248   K 3.7 07/09/2021 1248   CL 110 07/09/2021 1248   CO2 21 (L) 07/09/2021 1248    GLUCOSE 144 (H) 07/09/2021 1248   BUN 7 07/09/2021 1248   CREATININE 0.67 07/09/2021 1248   CALCIUM 8.8 (L) 07/09/2021 1248   PROT 6.5 07/09/2021 1248   ALBUMIN 3.9 07/09/2021 1248   AST 18 07/09/2021 1248   ALT 18  07/09/2021 1248   ALKPHOS 91 07/09/2021 1248   BILITOT 0.3 07/09/2021 1248   GFRNONAA >60 07/09/2021 1248   GFRAA >90 11/26/2011 2008    No results found for: TOTALPROTELP, ALBUMINELP, A1GS, A2GS, BETS, BETA2SER, GAMS, MSPIKE, SPEI  Lab Results  Component Value Date   WBC 14.9 (H) 07/09/2021   NEUTROABS 11.6 (H) 07/09/2021   HGB 12.4 07/09/2021   HCT 36.4 07/09/2021   MCV 91.0 07/09/2021   PLT 230 07/09/2021    No results found for: LABCA2  No components found for: [id number]  No results for input(s): INR in the last 168 hours.  No results found for: LABCA2  No results found for: ZHY865  No results found for: HQI696  No results found for: EXB284  No results found for: CA2729  No components found for: HGQUANT  No results found for: CEA1 / No results found for: CEA1   No results found for: AFPTUMOR  No results found for: CHROMOGRNA  No results found for: KPAFRELGTCHN, LAMBDASER, KAPLAMBRATIO (kappa/lambda light chains)  No results found for: HGBA, HGBA2QUANT, HGBFQUANT, HGBSQUAN (Hemoglobinopathy evaluation)   Lab Results  Component Value Date   LDH 223 11/26/2011    No results found for: IRON, TIBC, IRONPCTSAT (Iron and TIBC)  No results found for: FERRITIN  Urinalysis    Component Value Date/Time   COLORURINE YELLOW 11/26/2011 2000   APPEARANCEUR CLEAR 11/26/2011 2000   LABSPEC <1.005 (L) 11/26/2011 2000   PHURINE 6.5 11/26/2011 2000   GLUCOSEU NEGATIVE 11/26/2011 2000   HGBUR SMALL (A) 11/26/2011 2000   BILIRUBINUR NEGATIVE 11/26/2011 2000   KETONESUR NEGATIVE 11/26/2011 2000   PROTEINUR NEGATIVE 11/26/2011 2000   UROBILINOGEN 0.2 11/26/2011 2000   NITRITE NEGATIVE 11/26/2011 2000   LEUKOCYTESUR MODERATE (A) 11/26/2011 2000     STUDIES: DG Chest 2 View  Result Date: 06/30/2021 CLINICAL DATA:  New onset of cough, amino suppressed. EXAM: CHEST - 2 VIEW COMPARISON:  Chest x-ray 06/24/2021. FINDINGS: Left chest port catheter tip projects over the SVC, unchanged. The heart size and mediastinal contours are within normal limits. Both lungs are clear. The visualized skeletal structures are unremarkable. IMPRESSION: No active cardiopulmonary disease. Electronically Signed   By: Ronney Asters M.D.   On: 06/30/2021 16:41   CT Chest W Contrast  Result Date: 06/23/2021 CLINICAL DATA:  45 year old female with history of right-sided breast cancer. Staging examination. New onset of back pain. EXAM: CT CHEST WITH CONTRAST TECHNIQUE: Multidetector CT imaging of the chest was performed during intravenous contrast administration. CONTRAST:  2m OMNIPAQUE IOHEXOL 350 MG/ML SOLN COMPARISON:  Chest CTA 12/07/2017. FINDINGS: Cardiovascular: Heart size is normal. There is no significant pericardial fluid, thickening or pericardial calcification. No atherosclerotic calcifications are noted in the thoracic aorta or the coronary arteries. Mediastinum/Nodes: No pathologically enlarged mediastinal, internal mammary or hilar lymph nodes. Esophagus is unremarkable in appearance. No axillary lymphadenopathy. Lungs/Pleura: 4 mm right middle lobe pulmonary nodule (axial image 88 of series 5). 2 mm right middle lobe pulmonary nodule (axial image 130 of series 5). No other larger more suspicious appearing pulmonary nodules or masses are noted. No acute consolidative airspace disease. No pleural effusions. Upper Abdomen: Unremarkable. Musculoskeletal: In the superomedial aspect of the right breast there is a soft tissue attenuation lesion which demonstrates peripheral enhancement and some central calcification best appreciated on axial image 74 of series 2 and coronal image 6 of series 6 measuring 2.6 x 2.0 x 2.5 cm. This lesion is intimately associated with  the underlying pectoralis  musculature, without definite direct invasion at this time. There are no aggressive appearing lytic or blastic lesions noted in the visualized portions of the skeleton. IMPRESSION: 1. 2.6 x 2.0 x 2.5 cm peripherally enhancing partially calcified lesion in the superomedial aspect of the right breast corresponding to known right breast carcinoma. 2. Tiny pulmonary nodules in the right middle lobe measuring 4 mm or less in size, highly nonspecific and statistically likely benign. Attention on routine follow-up imaging is recommended to ensure the stability or resolution of these findings. No definitive signs of metastatic disease in the thorax are noted on today's examination. Electronically Signed   By: Vinnie Langton M.D.   On: 06/23/2021 06:23   NM Bone Scan Whole Body  Result Date: 06/24/2021 CLINICAL DATA:  History of breast cancer. EXAM: NUCLEAR MEDICINE WHOLE BODY BONE SCAN TECHNIQUE: Whole body anterior and posterior images were obtained approximately 3 hours after intravenous injection of radiopharmaceutical. RADIOPHARMACEUTICALS:  21.3 mCi Technetium-76mMDP IV COMPARISON:  CT 06/22/2021. FINDINGS: Bilateral renal function and excretion. Increased activity noted over both SI joints. Sacroiliitis could present in this fashion. Questionable increased activity noted about the lower lumbar spine/sacrum. AP view of the pelvis suggested for further evaluation. No other bony abnormalities identified. IMPRESSION: 1. Increased activity noted over the best SI joints. Sacroiliitis could present in this fashion. Questionable increased activity noted about the lower lumbar spine/sacrum. AP view of the pelvis suggested for further evaluation. 2.  No other bony abnormalities identified. Electronically Signed   By: TMarcello Moores Register M.D.   On: 06/24/2021 09:03   MR BREAST BILATERAL W WO CONTRAST INC CAD  Addendum Date: 06/16/2021   ADDENDUM REPORT: 06/16/2021 16:02 ADDENDUM: Please note  corrections to report, below. Right breast: Within the UPPER INNER QUADRANT of the RIGHT breast there is a rim enhancing mass measuring 2.6 x 2.2 x 3.1 centimeters. Electronically Signed   By: ENolon NationsM.D.   On: 06/16/2021 16:02   Result Date: 06/16/2021 CLINICAL DATA:  Staging for new breast cancer diagnosis. Recent ultrasound-guided core biopsy of mass in the 12 o'clock location of the RIGHT breast shows grade 3 invasive ductal carcinoma. Biopsy of RIGHT axillary lymph node shows concordant benign lymph node tissue, negative for metastatic disease. LABS:  None obtained at the time of imaging. EXAM: BILATERAL BREAST MRI WITH AND WITHOUT CONTRAST TECHNIQUE: Multiplanar, multisequence MR images of both breasts were obtained prior to and following the intravenous administration of 5 ml of Gadavist Three-dimensional MR images were rendered by post-processing of the original MR data on an independent workstation. The three-dimensional MR images were interpreted, and findings are reported in the following complete MRI report for this study. Three dimensional images were evaluated at the independent interpreting workstation using the DynaCAD thin client. COMPARISON:  Previous exam(s). FINDINGS: Breast composition: d. Extreme fibroglandular tissue. Background parenchymal enhancement: Minimal Right breast: Within the UPPER INNER QUADRANT of the RIGHT breast there is a rib enhancing mass measuring 2.6 x 2.2 x 3.1 centimeters. Within the central aspect of the mass, tissue marker clip is consistent with recent core biopsy which shows grade 3 invasive ductal carcinoma. The size on MR and ultrasound are similar. Suspected satellite nodule seen on ultrasound is not well seen on MRI. Left breast: No mass or abnormal enhancement. Lymph nodes: No abnormal appearing lymph nodes. Tissue marker clip is identified within a RIGHT axillary lymph node on image 7 of series 2, marking the site of recently biopsied benign  concordant lymph node. Ancillary findings:  None. IMPRESSION: 1. 3.1 centimeter mass in the [place] of the RIGHT breast consistent with known grade 3 invasive ductal carcinoma. 2. LEFT breast is negative. RECOMMENDATION: Recommend treatment plan for known RIGHT breast malignancy. BI-RADS CATEGORY  6: Known biopsy-proven malignancy. Electronically Signed: By: Nolon Nations M.D. On: 06/15/2021 13:00  DG CHEST PORT 1 VIEW  Result Date: 06/24/2021 CLINICAL DATA:  Post Port-A-Cath placement EXAM: PORTABLE CHEST 1 VIEW COMPARISON:  Portable exam 1248 hours compared to 01/03/2013 FINDINGS: LEFT subclavian Port-A-Cath with tip projecting over SVC at/near cavoatrial junction. Normal heart size, mediastinal contours, and pulmonary vascularity. Lungs clear. No pulmonary infiltrate, pleural effusion, or pneumothorax. Biopsy clip RIGHT breast with additional surgical clip at lateral RIGHT breast. No acute osseous findings. IMPRESSION: LEFT subclavian Port-A-Cath without pneumothorax. Electronically Signed   By: Lavonia Dana M.D.   On: 06/24/2021 13:57   DG Fluoro Guide CV Line-No Report  Result Date: 06/24/2021 Fluoroscopy was utilized by the requesting physician.  No radiographic interpretation.   ECHOCARDIOGRAM COMPLETE  Result Date: 06/22/2021    ECHOCARDIOGRAM REPORT   Patient Name:   PRISCILLE SHADDUCK Date of Exam: 06/22/2021 Medical Rec #:  [id number]   Height:       66.0 in Accession #:    [id number]  Weight:       125.0 lb Date of Birth:  11/15/1975    BSA:          1.638 m Patient Age:    61 years    BP:           146/90 mmHg Patient Gender: F           HR:           72 bpm. Exam Location:  Outpatient Procedure: 2D Echo, 3D Echo, Cardiac Doppler, Color Doppler and Strain Analysis Indications:    Chemo Z09  History:        Patient has no prior history of Echocardiogram examinations.                 Risk Factors:Hypertension.  Sonographer:    Darlina Sicilian RDCS Referring Phys: [place]  1. Left ventricular ejection fraction, by estimation, is 55 to 60%. The left ventricle has normal function. The left ventricle has no regional wall motion abnormalities. Left ventricular diastolic parameters were normal. The average left ventricular global longitudinal strain is -23.0 %. The global longitudinal strain is normal.  2. Right ventricular systolic function is normal. The right ventricular size is normal.  3. The mitral valve is normal in structure. No evidence of mitral valve regurgitation. No evidence of mitral stenosis.  4. The aortic valve is tricuspid. Aortic valve regurgitation is not visualized. No aortic stenosis is present.  5. The inferior vena cava is normal in size with greater than 50% respiratory variability, suggesting right atrial pressure of 3 mmHg. FINDINGS  Left Ventricle: Left ventricular ejection fraction, by estimation, is 55 to 60%. The left ventricle has normal function. The left ventricle has no regional wall motion abnormalities. The average left ventricular global longitudinal strain is -23.0 %. The global longitudinal strain is normal. The left ventricular internal cavity size was normal in size. There is no left ventricular hypertrophy. Left ventricular diastolic parameters were normal. Right Ventricle: The right ventricular size is normal. No increase in right ventricular wall thickness. Right ventricular systolic function is normal. Left Atrium: Left atrial size was normal in size. Right Atrium: Right atrial size was normal  in size. Pericardium: There is no evidence of pericardial effusion. Mitral Valve: The mitral valve is normal in structure. No evidence of mitral valve regurgitation. No evidence of mitral valve stenosis. Tricuspid Valve: The tricuspid valve is normal in structure. Tricuspid valve regurgitation is not demonstrated. No evidence of tricuspid stenosis. Aortic Valve: The aortic valve is tricuspid. Aortic valve regurgitation is not visualized. No  aortic stenosis is present. Pulmonic Valve: The pulmonic valve was normal in structure. Pulmonic valve regurgitation is not visualized. No evidence of pulmonic stenosis. Aorta: The aortic root is normal in size and structure. Venous: The inferior vena cava is normal in size with greater than 50% respiratory variability, suggesting right atrial pressure of 3 mmHg. IAS/Shunts: No atrial level shunt detected by color flow Doppler.  LEFT VENTRICLE PLAX 2D LVIDd:         4.50 cm   Diastology LVIDs:         3.55 cm   LV e' medial:    9.52 cm/s LV PW:         0.70 cm   LV E/e' medial:  5.3 LV IVS:        0.80 cm   LV e' lateral:   14.00 cm/s LVOT diam:     2.00 cm   LV E/e' lateral: 3.6 LV SV:         57 LV SV Index:   35        2D Longitudinal Strain LVOT Area:     3.14 cm  2D Strain GLS Avg:     -23.0 %  RIGHT VENTRICLE RV S prime:     12.50 cm/s TAPSE (M-mode): 2.0 cm LEFT ATRIUM             Index        RIGHT ATRIUM          Index LA diam:        3.00 cm 1.83 cm/m   RA Area:     7.44 cm LA Vol (A2C):   17.8 ml 10.87 ml/m  RA Volume:   13.00 ml 7.94 ml/m LA Vol (A4C):   13.7 ml 8.37 ml/m LA Biplane Vol: 16.6 ml 10.14 ml/m  AORTIC VALVE LVOT Vmax:   102.00 cm/s LVOT Vmean:  66.300 cm/s LVOT VTI:    0.180 m  AORTA Ao Root diam: 2.90 cm Ao Asc diam:  2.90 cm MITRAL VALVE MV Area (PHT): 3.46 cm    SHUNTS MV Decel Time: 219 msec    Systemic VTI:  0.18 m MV E velocity: 50.00 cm/s  Systemic Diam: 2.00 cm MV A velocity: 40.40 cm/s MV E/A ratio:  1.24 Jenkins Rouge MD Electronically signed by Jenkins Rouge MD Signature Date/Time: 06/22/2021/10:28:32 AM    Final    DG HIPS BILAT WITH PELVIS 3-4 VIEWS  Result Date: 07/01/2021 CLINICAL DATA:  Abnormal bone scan.  Breast cancer. EXAM: DG HIP (WITH OR WITHOUT PELVIS) 3-4V BILAT COMPARISON:  None. FINDINGS: There is no evidence of hip fracture or dislocation. There is no evidence of arthropathy or other focal bone abnormality. IMPRESSION: Negative. Electronically Signed    By: Fidela Salisbury M.D.   On: 07/01/2021 15:37     ELIGIBLE FOR AVAILABLE RESEARCH PROTOCOL: no  ASSESSMENT: 45 y.o. [place] woman status post right breast upper outer quadrant biopsy 06/04/2021 for a clinical mT2 N0 invasive ductal carcinoma, grade 3, estrogen and progesterone receptor weakly positive, HER2 not amplified, with an MIB-1 of 70%  (A) chest CT with  contrast 06/22/2021 shows right breast mass, no other lesions of concern; pulmonary nodules all less than 5 mm noted, likely benign  (B) bone scan 06/22/2021 negative for metastases; consistent with sacroiliitis (1) genetics testing: (A) [facility] BRCAplus dated 06/16/2021 tensions in ATM, BRCA1, BRCA2, CDH1, CHEK2, PALB2, PTEN, and TP53.  (2) neoadjuvant chemotherapy consisting of doxorubicin and cyclophosphamide in dose dense fashion x4 started 0/13/2022, to be followed by weekly paclitaxel x12  (3) definitive surgery to follow  (4) adjuvant radiation as appropriate  (5) antiestrogens   PLAN:  Pegge did generally well with her first cycle of AC.  We are going to drop the steroids to 4 mg instead of 8 and I hope that this will cause less irritability while maintaining good nausea control.  She is using the Compazine as well.  We also discussed constipation which likely was due to the premeds.  If she has not had a bowel movement by day 3 in the morning she will start MiraLAX and stool softeners.  I reassured her that the blurred vision she is experiencing is due to accommodation issues and will clear after chemotherapy has been completed.  Her nadir counts were excellent and I do not see we need to see her in between treatments  They had a trip planned which unfortunately had to be canceled because of her diagnosis and treatment and I was glad to write a letter for them to see if they can get some reimbursement.  Her third chemo cycle would fall on November 24 which is of course Thanksgiving day.  We are going to move it to  December 1 instead.  She knows to call us for any other issue that may develop before the next visit  Total encounter time 35 minutes.Sarajane Jews C. Celestia Duva, MD 07/09/21 5:22 PM Medical Oncology and Hematology [facility] [place], [place] [address] Tel. [phone]    Fax. [phone]   I, Wilburn Mylar, am acting as scribe for Dr. Virgie Dad. Rosine Solecki.  I, Lurline Del MD, have reviewed the above documentation for accuracy and completeness, and I agree with the above.    *Total Encounter Time as defined by the Centers for Medicare and Medicaid Services includes, in addition to the face-to-face time of a patient visit (documented in the note above) non-face-to-face time: obtaining and reviewing outside history, ordering and reviewing medications, tests or procedures, care coordination (communications with other health care professionals or caregivers) and documentation in the medical record.

## 2021-07-09 NOTE — Progress Notes (Signed)
Nutrition Assessment   ASSESSMENT: 45 year old female receiving neoadjuvant chemotherapy with  doxorubicin and paclitaxel for weakly ER+ breast cancer.   Past medical history includes depression, varicella, HTN  Met with patient during infusion. She reports tolerating first chemotherapy fairly well. Patient reports episode of diarrhea followed by constipation. This has resolved, reports regular bowel movements twice/day. She reports nausea, she is taking compazine and ativan. This is working well for her. Patient reports a few days of decreased appetite. This has improved, reports eating 3 meals and snacks in between. Patient reports eating healthy, limited intake of meats, avoiding refined sugars and dairy. This morning she had Muesli and Kiefer, 1/2 bagel with non-dairy cream cheese. She has brought walnuts and grapes to snack on during treatment today. Last night patient had tikka masala with chickpeas  and brown rice. She usually eats a salad for lunch. Patient continues to stay active, walks 30 minutes/day.    Nutrition Focused Physical Exam: deferred   Medications: decadron, tramadol, ativan, compazine   Labs: Glucose 144   Anthropometrics: Weight has decreased 5 lbs (4%) in 9 days from 126 lb 3.2 oz on 10/18. This is significant  Height: 5'6" Weight: 121 lb 4.8 oz  UBW: 130 lb (01/23/21 per care everywhere) BMI: 19.58   NUTRITION DIAGNOSIS: Unintentional weight loss related to cancer and associated treatment as evidenced by reported decreased appetite and 4% weight loss in <2 weeks which is significant for time frame.    INTERVENTION:  Educated on importance of adequate calories and protein energy intake to maintain strength, weights, nutrition Encouraged small frequent meals and snacks with adequate calories and protein Suggested pt have a bedtime snack  Encouraged liberalized diet, eating foods high in calories and protein - handout with snack ideas provided Recommend  drinking ONS for added calories and protein - provided sample of [place] for pt to try  Discussed fiber for bowel management - handout provided Provided contact information   MONITORING, EVALUATION, GOAL: Patient will tolerate increased calories and protein to maintain weights   Next Visit: Wednesday November 23 during infusion

## 2021-07-11 ENCOUNTER — Other Ambulatory Visit: Payer: Self-pay

## 2021-07-11 ENCOUNTER — Inpatient Hospital Stay: Payer: No Typology Code available for payment source

## 2021-07-11 VITALS — BP 139/89 | HR 86 | Temp 97.5°F | Resp 18

## 2021-07-11 DIAGNOSIS — C50411 Malignant neoplasm of upper-outer quadrant of right female breast: Secondary | ICD-10-CM | POA: Diagnosis not present

## 2021-07-11 MED ORDER — PEGFILGRASTIM-BMEZ 6 MG/0.6ML ~~LOC~~ SOSY
6.0000 mg | PREFILLED_SYRINGE | Freq: Once | SUBCUTANEOUS | Status: AC
  Administered 2021-07-11: 6 mg via SUBCUTANEOUS

## 2021-07-14 ENCOUNTER — Encounter: Payer: Self-pay | Admitting: *Deleted

## 2021-07-22 NOTE — Progress Notes (Signed)
Ebony Lamb  [phone] [phone]    ID: Ebony Lamb DOB: 1975/09/20  MR#: [id number]  VQM#:[id number]  Patient Care Team: Antony Contras, MD as PCP - General (Family Medicine) Coralie Keens, MD as Consulting Physician (General Surgery) Carletha Dawn, Virgie Dad, MD as Consulting Physician (Oncology) Eppie Gibson, MD as Attending Physician (Radiation Oncology) Rockwell Germany, RN as Oncology Nurse Navigator Mauro Kaufmann, RN as Oncology Nurse Navigator Rozetta Nunnery, MD as Consulting Physician (Otolaryngology) Sanjuana Kava, MD as Referring Physician (Obstetrics and Gynecology) [name], MD as Consulting Physician (Plastic Surgery) Chauncey Cruel, MD OTHER MD:  CHIEF COMPLAINT: Weakly estrogen receptor positive breast cancer  CURRENT TREATMENT: Neoadjuvant chemotherapy   INTERVAL HISTORY: Ebony Lamb returns today for follow up and treatment of her weakly estrogen receptor positive breast cancer.  She is accompanied by her husband Ebony Lamb  She started neoadjuvant chemotherapy 06/25/2021 with doxorubicin and cyclophosphamide.  She returns today for cycle 3.   REVIEW OF SYSTEMS: [name] still had some difficulties with cycle 2 despite the changes we made earlier.  She was still constipated and had some bleeding although less hemorrhoidal discomfort.  We lowered the dose of dexamethasone and that helped as far as the agitation or irritability she was experiencing from that drug but on the other hand she had more nausea.  She has some blurred vision which is expected.  She has bust off for the remaining here since it was pretty much falling off anyway.  She had 2 small mouth sores which went away "immediately" she said.  She is trying to be able to spend time with family around Thanksgiving and Christmas and that is leading to some changes in scheduling.  Aside from these issues a detailed review of systems was stable.   COVID 19 VACCINATION STATUS:  Pfizer x3; infection 01/2021   HISTORY OF CURRENT ILLNESS: From the original intake note:  Ebony Lamb (pronounced "REECE") has a history of previous right breast biopsy (8 o'clock) in 07/2019 that was negative.  More recently she presented with a palpable right breast mass at 12 o'clock. She underwent right diagnostic mammography with tomography and right breast ultrasonography at [facility] on 06/01/2021 showing: breast density category D; palpable 3 cm irregular mass in right breast at 12 o'clock; possible 5 mm satellite lesion located 2.5 cm from dominant mass; abnormal right axillary lymph node.  Accordingly on 06/04/2021 she proceeded to biopsy of the right breast area in question. The pathology from this procedure ([id number]) showed: invasive ductal carcinoma, grade 3. Prognostic indicators significant for: estrogen receptor, 15% positive and progesterone receptor, 15% positive, both with weak-moderate staining intensity. Proliferation marker Ki67 at 70%. HER2 equivocal by immunohistochemistry (2+), but negative by fluorescent in situ hybridization with a signals ratio 1.47 and number per cell 2.50.  Biopsy of the questionable lymph node was negative and concordant  Cancer Staging Malignant neoplasm of upper-outer quadrant of right breast in female, estrogen receptor positive ([place]) Staging form: Breast, AJCC 8th Edition - Clinical stage from 06/10/2021: Stage IIA (cT2, cN0, cM0, G3, ER+, PR+, HER2-) - Signed by Chauncey Cruel, MD on 06/10/2021 Stage prefix: Initial diagnosis Histologic grading system: 3 grade system Laterality: Right Staged by: Pathologist and managing physician Stage used in treatment planning: Yes National guidelines used in treatment planning: Yes Type of national guideline used in treatment planning: NCCN  The patient's subsequent history is as detailed below.   PAST MEDICAL HISTORY: Past Medical History:  Diagnosis Date   Abnormal Pap smear 09/13/2000    Allergy 02/2020   Seasonal   Cancer ([place])    Depression    history of depression- denies currently   H/O varicella    Hypertension 09/14/2011   post partum- meds x 1 mo   No pertinent past medical history    Preterm labor     PAST SURGICAL HISTORY: Past Surgical History:  Procedure Laterality Date   BREAST BIOPSY Right 07/27/2019   CESAREAN SECTION  11/14/2011   Procedure: CESAREAN SECTION;  Surgeon: Eli Hose, MD;  Location: [facility];  Service: Gynecology;  Laterality: N/A;  Primary cesarean section with delivery of baby boy at [phone]. Apgars 9/9.   CESAREAN SECTION N/A 03/22/2014   Procedure: Repeat CESAREAN SECTION;  Surgeon: Alwyn Pea, MD;  Location: [facility];  Service: Obstetrics;  Laterality: N/A;   COSMETIC SURGERY  11/22/2017   Abdominoplasty   FOOT SURGERY     FRACTURE SURGERY  08/1999   Repair of 5th metatarsal in left foot   HERNIA REPAIR  11/22/2017   PORTACATH PLACEMENT N/A 06/24/2021   Procedure: INSERTION PORT-A-CATH;  Surgeon: Coralie Keens, MD;  Location: [facility];  Service: General;  Laterality: N/A;   TUBAL LIGATION  11/22/2017   Tubes were completely removed   WISDOM TOOTH EXTRACTION      FAMILY HISTORY: Family History  Problem Relation Age of Onset   Breast cancer Mother 99   Osteopenia Mother    Arthritis Mother    Other Mother        BRCA2 gene mutation   Hypertension Father    Kidney disease Father    Depression Father    Hyperlipidemia Father    Melanoma Father 48   Hypertension Brother    Hyperlipidemia Brother    Breast cancer Maternal Grandmother        dx. late 30s/40s   Heart disease Maternal Grandfather    Other Maternal Grandfather        BRCA2 gene mutation   Ovarian cancer Paternal Grandmother        dx. 66s   Stroke Paternal Grandfather    Birth defects Son    Anesthesia problems Neg Hx    Diabetes Neg Hx    Her parents are both living, her father age 62 and her mother age 43 as of 05/2021. [name]  has one brother (and no sisters). She reports breast cancer in her mother at age 52 and her maternal grandmother in her late 34's, melanoma in her father at age 25, and ovarian cancer in her paternal grandmother in her 67's.   GYNECOLOGIC HISTORY:  No LMP recorded. Menarche: 45 years old Age at first live birth: 45 years old Echo P 2 LMP 06/06/2021 Contraceptive: used from 1996-2003 and 2006-2012 HRT n/a  Hysterectomy? no BSO? 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 Husband Ebony Lamb work for [employer] in [employer]. She lives at home with Ebony Lamb and their two children-- Tiffany Kocher, age 44, and Cori Razor, age 59. 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    ADVANCED DIRECTIVES: in place   HEALTH MAINTENANCE: Social History   Tobacco Use   Smoking status: Never   Smokeless tobacco: Never  Vaping Use   Vaping Use: Never used  Substance Use Topics   Alcohol use: Yes    Alcohol/week: 7.0 standard drinks    Types: 7 Standard  drinks or equivalent per week    Comment: I stopped drinking alcohol after finding the lump in my brea   Drug use: No     Colonoscopy: never done (age)  PAP: 06/2020  Bone density: never done (age)   No Known Allergies  Current Outpatient Medications  Medication Sig Dispense Refill   dexamethasone (DECADRON) 1 MG tablet Take 1 or 2 tablets twice daily as instructed (together with 4 mg dexamethasone tablet) 20 tablet 1   ondansetron (ZOFRAN) 8 MG tablet Take 1 tablet (8 mg total) by mouth every 8 (eight) hours as needed for nausea or vomiting. May take starting day 3 of chemotherapy 20 tablet 0   albuterol (VENTOLIN HFA) 108 (90 Base) MCG/ACT inhaler Inhale 2 puffs into the lungs every 6 (six) hours as needed for wheezing or shortness of breath. 8 g 2   dexamethasone (DECADRON) 4 MG tablet Take 1 tablet (4 mg total) by mouth 2 (two) times daily. Take daily for 3 days after chemo. Take with food.  30 tablet 1   doxycycline (VIBRA-TABS) 100 MG tablet Take 1 tablet (100 mg total) by mouth 2 (two) times daily. 10 tablet 0   lidocaine-prilocaine (EMLA) cream Apply to affected area once 30 g 3   loratadine (CLARITIN) 10 MG tablet Take 1 tablet (10 mg total) by mouth daily. Take day post chemo and then daily for 10 days with each treatment 30 tablet 3   LORazepam (ATIVAN) 0.5 MG tablet Take 1 tablet (0.5 mg total) by mouth at bedtime as needed (Nausea or vomiting). 30 tablet 0   pramoxine-hydrocortisone cream Apply topically 3 (three) times daily. 57 g 0   prochlorperazine (COMPAZINE) 10 MG tablet Take 1 tablet (10 mg total) by mouth every 6 (six) hours as needed (Nausea or vomiting). 30 tablet 1   traMADol (ULTRAM) 50 MG tablet Take 1-2 tablets (50-100 mg total) by mouth every 6 (six) hours as needed. 20 tablet 0   No current facility-administered medications for this visit.   Facility-Administered Medications Ordered in Other Visits  Medication Dose Route Frequency Provider Last Rate Last Admin   cyclophosphamide (CYTOXAN) 980 mg in sodium chloride 0.9 % 250 mL chemo infusion  600 mg/m2 (Treatment Plan Recorded) Intravenous Once Serina Nichter, Virgie Dad, MD       dexamethasone (DECADRON) 10 mg in sodium chloride 0.9 % 50 mL IVPB  10 mg Intravenous Once Jahzeel Poythress, Virgie Dad, MD       DOXOrubicin (ADRIAMYCIN) chemo injection 98 mg  60 mg/m2 (Treatment Plan Recorded) Intravenous Once Kelvon Giannini, Virgie Dad, MD       fosaprepitant (EMEND) 150 mg in sodium chloride 0.9 % 145 mL IVPB  150 mg Intravenous Once Delorice Bannister, Virgie Dad, MD       heparin lock flush 100 unit/mL  500 Units Intracatheter Once PRN Patty Lopezgarcia, Virgie Dad, MD       influenza vac split quadrivalent PF (FLUARIX) 0.5 ML injection            influenza vac split quadrivalent PF (FLUARIX) injection 0.5 mL  0.5 mL Intramuscular Once Kierstan Auer, Virgie Dad, MD       sodium chloride flush (NS) 0.9 % injection 10 mL  10 mL Intracatheter PRN Nyhla Mountjoy, Virgie Dad,  MD        OBJECTIVE: White woman who appears stated age  Vitals:   07/23/21 0833  BP: 108/70  Pulse: 87  Resp: 16  Temp: 99.7 F (37.6 C)  SpO2: 100%  Body mass index is 19.66 kg/m.   Wt Readings from Last 3 Encounters:  07/23/21 121 lb 12.8 oz (55.2 kg)  07/09/21 121 lb 4.8 oz (55 kg)  06/30/21 126 lb 3.2 oz (57.2 kg)     ECOG FS:1 - Symptomatic but completely ambulatory  Sclerae unicteric, EOMs intact Wearing a mask No cervical or supraclavicular adenopathy Lungs no rales or rhonchi Heart regular rate and rhythm Abd soft, nontender, positive bowel sounds MSK no focal spinal tenderness, no upper extremity lymphedema Neuro: nonfocal, well oriented, appropriate affect Breasts: She has "young breasts" which are lumpy and it is difficult for me to tell if there has been any change in the palpable mass in the right breast.  It does seem a little bit better defined this time than last time but what I am feeling may actually be benign dense breast tissue.   LAB RESULTS:  CMP     Component Value Date/Time   NA 139 07/23/2021 0819   K 4.1 07/23/2021 0819   CL 109 07/23/2021 0819   CO2 21 (L) 07/23/2021 0819   GLUCOSE 93 07/23/2021 0819   BUN 11 07/23/2021 0819   CREATININE 0.63 07/23/2021 0819   CALCIUM 8.7 (L) 07/23/2021 0819   PROT 6.6 07/23/2021 0819   ALBUMIN 3.8 07/23/2021 0819   AST 18 07/23/2021 0819   ALT 19 07/23/2021 0819   ALKPHOS 107 07/23/2021 0819   BILITOT 0.3 07/23/2021 0819   GFRNONAA >60 07/23/2021 0819   GFRAA >90 11/26/2011 2008    No results found for: TOTALPROTELP, ALBUMINELP, A1GS, A2GS, BETS, BETA2SER, GAMS, MSPIKE, SPEI  Lab Results  Component Value Date   WBC 11.4 (H) 07/23/2021   NEUTROABS 8.8 (H) 07/23/2021   HGB 11.5 (L) 07/23/2021   HCT 33.7 (L) 07/23/2021   MCV 91.1 07/23/2021   PLT 179 07/23/2021    No results found for: LABCA2  No components found for: HYQMVH846  No results for input(s): INR in the last 168  hours.  No results found for: LABCA2  No results found for: NGE952  No results found for: WUX324  No results found for: MWN027  No results found for: CA2729  No components found for: HGQUANT  No results found for: CEA1 / No results found for: CEA1   No results found for: AFPTUMOR  No results found for: CHROMOGRNA  No results found for: KPAFRELGTCHN, LAMBDASER, KAPLAMBRATIO (kappa/lambda light chains)  No results found for: HGBA, HGBA2QUANT, HGBFQUANT, HGBSQUAN (Hemoglobinopathy evaluation)   Lab Results  Component Value Date   LDH 223 11/26/2011    No results found for: IRON, TIBC, IRONPCTSAT (Iron and TIBC)  No results found for: FERRITIN  Urinalysis    Component Value Date/Time   COLORURINE YELLOW 11/26/2011 2000   APPEARANCEUR CLEAR 11/26/2011 2000   LABSPEC <1.005 (L) 11/26/2011 2000   PHURINE 6.5 11/26/2011 2000   GLUCOSEU NEGATIVE 11/26/2011 2000   HGBUR SMALL (A) 11/26/2011 2000   BILIRUBINUR NEGATIVE 11/26/2011 2000   KETONESUR NEGATIVE 11/26/2011 2000   PROTEINUR NEGATIVE 11/26/2011 2000   UROBILINOGEN 0.2 11/26/2011 2000   NITRITE NEGATIVE 11/26/2011 2000   LEUKOCYTESUR MODERATE (A) 11/26/2011 2000    STUDIES: DG Chest 2 View  Result Date: 06/30/2021 CLINICAL DATA:  New onset of cough, amino suppressed. EXAM: CHEST - 2 VIEW COMPARISON:  Chest x-ray 06/24/2021. FINDINGS: Left chest port catheter tip projects over the SVC, unchanged. The heart size and mediastinal contours are within normal limits. Both lungs are clear. The visualized skeletal  structures are unremarkable. IMPRESSION: No active cardiopulmonary disease. Electronically Signed   By: Ronney Asters M.D.   On: 06/30/2021 16:41   DG CHEST PORT 1 VIEW  Result Date: 06/24/2021 CLINICAL DATA:  Post Port-A-Cath placement EXAM: PORTABLE CHEST 1 VIEW COMPARISON:  Portable exam 1248 hours compared to 01/03/2013 FINDINGS: LEFT subclavian Port-A-Cath with tip projecting over SVC at/near cavoatrial  junction. Normal heart size, mediastinal contours, and pulmonary vascularity. Lungs clear. No pulmonary infiltrate, pleural effusion, or pneumothorax. Biopsy clip RIGHT breast with additional surgical clip at lateral RIGHT breast. No acute osseous findings. IMPRESSION: LEFT subclavian Port-A-Cath without pneumothorax. Electronically Signed   By: Lavonia Dana M.D.   On: 06/24/2021 13:57   DG Fluoro Guide CV Line-No Report  Result Date: 06/24/2021 Fluoroscopy was utilized by the requesting physician.  No radiographic interpretation.   DG HIPS BILAT WITH PELVIS 3-4 VIEWS  Result Date: 07/01/2021 CLINICAL DATA:  Abnormal bone scan.  Breast cancer. EXAM: DG HIP (WITH OR WITHOUT PELVIS) 3-4V BILAT COMPARISON:  None. FINDINGS: There is no evidence of hip fracture or dislocation. There is no evidence of arthropathy or other focal bone abnormality. IMPRESSION: Negative. Electronically Signed   By: Fidela Salisbury M.D.   On: 07/01/2021 15:37     ELIGIBLE FOR AVAILABLE RESEARCH PROTOCOL: no  ASSESSMENT: 45 y.o. [place] woman status post right breast upper outer quadrant biopsy 06/04/2021 for a clinical mT2 N0 invasive ductal carcinoma, grade 3, estrogen and progesterone receptor weakly positive, HER2 not amplified, with an MIB-1 of 70%  (A) chest CT with contrast 06/22/2021 shows right breast mass, no other lesions of concern; pulmonary nodules all less than 5 mm noted, likely benign  (B) bone scan 06/22/2021 negative for metastases; consistent with sacroiliitis  (1) genetics testing: (A) [facility] BRCAplus dated 06/16/2021 finds no deleterious mutations then in ATM, BRCA1, BRCA2, CDH1, CHEK2, PALB2, PTEN, and TP53 (B) The [facility]-Expanded gene panel obtained 06/19/2021 showed no deleterious mutations in AIP, ALK, APC, ATM, AXIN2, BAP1, BARD1, BLM, BMPR1A, BRCA1, BRCA2, BRIP1, CDC73, CDH1, CDK4, CDKN1B, CDKN2A, CHEK2, CTNNA1, DICER1, FANCC, FH, FLCN, GALNT12, KIF1B, LZTR1, MAX, MEN1, MET, MLH1, MSH2, MSH3,  MSH6, MUTYH, NBN, NF1, NF2, NTHL1, PALB2, PHOX2B, PMS2, POT1, PRKAR1A, PTCH1, PTEN, RAD51C, RAD51D, RB1, RECQL, RET, SDHA, SDHAF2, SDHB, SDHC, SDHD, SMAD4, SMARCA4, SMARCB1, SMARCE1, STK11, SUFU, TMEM127, TP53, TSC1, TSC2, VHL and XRCC2 (sequencing and deletion/duplication); EGFR, EGLN1, HOXB13, KIT, MITF, PDGFRA, POLD1, and POLE (sequencing only); EPCAM and GREM1 (deletion/duplication only). (C)  a variant of uncertain significance was identified in the PTCH1 gene  (2) neoadjuvant chemotherapy consisting of doxorubicin and cyclophosphamide in dose dense fashion x4 started 0/13/2022, to be followed by weekly paclitaxel x12  (3) definitive surgery to follow  (4) adjuvant radiation as appropriate  (5) antiestrogens   PLAN:  Aunica is tolerating treatment moderately well.  I think we can make a couple of changes in her supportive meds again.  I am writing her for dexamethasone 1 mg tablets so she can take either 5 mg or 6 mg twice daily on days 2 and 3 and then again on the morning of day for.  The goal here is to control the nausea better without giving her the revved up feeling she was having when she took 6 mg of dexamethasone.  We are also adding ondansetron which she may start taking on day 3.  Finally I also suggested she take MiraLAX daily for 4 days beginning today and also add stool softeners once a day  The  more important issue though is the fact that her breast is still pretty lumpy and I think we are not having as vigorously response as I had hoped.  I am going to obtain an ultrasound of the right breast on August 13, 2021 just to confirm that.  Her tumor is very weakly estrogen and progesterone receptor positive and I think it would be prudent for [name] to add carboplatin to her treatments when she starts the paclitaxel.  I will enter those orders after we get the ultrasound results and I will have a virtual visit with her to discuss further but she is in agreement.  She would prefer to be  treated on November 25 instead of the 23rd and I have changed the orders to accommodate that.  She will then receive [place] and Taxol December 8 and December 15 and she will see me both those dates.  She is going to miss the December 23 treatment but she will see Dr. Sonny Dandy on December 30 to resume her treatment.  Total encounter time 35 minutes.Sarajane Jews C. Sophya Vanblarcom, MD 07/23/21 9:46 AM Medical Oncology and Hematology [facility] [place], [place] [address] Tel. [phone]    Fax. [phone]   I, Wilburn Mylar, am acting as scribe for Dr. Virgie Dad. Jurell Basista.  I, Lurline Del MD, have reviewed the above documentation for accuracy and completeness, and I agree with the above.   *Total Encounter Time as defined by the Centers for Medicare and Medicaid Services includes, in addition to the face-to-face time of a patient visit (documented in the note above) non-face-to-face time: obtaining and reviewing outside history, ordering and reviewing medications, tests or procedures, care coordination (communications with other health care professionals or caregivers) and documentation in the medical record.

## 2021-07-23 ENCOUNTER — Inpatient Hospital Stay (HOSPITAL_BASED_OUTPATIENT_CLINIC_OR_DEPARTMENT_OTHER): Payer: No Typology Code available for payment source | Admitting: Oncology

## 2021-07-23 ENCOUNTER — Inpatient Hospital Stay: Payer: No Typology Code available for payment source | Attending: Oncology

## 2021-07-23 ENCOUNTER — Ambulatory Visit: Payer: No Typology Code available for payment source

## 2021-07-23 ENCOUNTER — Other Ambulatory Visit: Payer: Self-pay

## 2021-07-23 ENCOUNTER — Inpatient Hospital Stay: Payer: No Typology Code available for payment source

## 2021-07-23 ENCOUNTER — Encounter: Payer: Self-pay | Admitting: Oncology

## 2021-07-23 VITALS — BP 108/70 | HR 87 | Temp 99.7°F | Resp 16 | Ht 66.0 in | Wt 121.8 lb

## 2021-07-23 DIAGNOSIS — Z95828 Presence of other vascular implants and grafts: Secondary | ICD-10-CM

## 2021-07-23 DIAGNOSIS — C50411 Malignant neoplasm of upper-outer quadrant of right female breast: Secondary | ICD-10-CM | POA: Diagnosis present

## 2021-07-23 DIAGNOSIS — Z5111 Encounter for antineoplastic chemotherapy: Secondary | ICD-10-CM | POA: Insufficient documentation

## 2021-07-23 DIAGNOSIS — Z5189 Encounter for other specified aftercare: Secondary | ICD-10-CM | POA: Diagnosis not present

## 2021-07-23 DIAGNOSIS — Z8673 Personal history of transient ischemic attack (TIA), and cerebral infarction without residual deficits: Secondary | ICD-10-CM | POA: Diagnosis not present

## 2021-07-23 DIAGNOSIS — Z803 Family history of malignant neoplasm of breast: Secondary | ICD-10-CM | POA: Insufficient documentation

## 2021-07-23 DIAGNOSIS — Z170 Estrogen receptor positive status [ER+]: Secondary | ICD-10-CM

## 2021-07-23 DIAGNOSIS — Z8616 Personal history of COVID-19: Secondary | ICD-10-CM | POA: Insufficient documentation

## 2021-07-23 DIAGNOSIS — Z8041 Family history of malignant neoplasm of ovary: Secondary | ICD-10-CM | POA: Insufficient documentation

## 2021-07-23 DIAGNOSIS — Z79899 Other long term (current) drug therapy: Secondary | ICD-10-CM | POA: Insufficient documentation

## 2021-07-23 DIAGNOSIS — K5900 Constipation, unspecified: Secondary | ICD-10-CM | POA: Insufficient documentation

## 2021-07-23 DIAGNOSIS — I10 Essential (primary) hypertension: Secondary | ICD-10-CM | POA: Diagnosis not present

## 2021-07-23 LAB — CMP (CANCER CENTER ONLY)
ALT: 19 U/L (ref 0–44)
AST: 18 U/L (ref 15–41)
Albumin: 3.8 g/dL (ref 3.5–5.0)
Alkaline Phosphatase: 107 U/L (ref 38–126)
Anion gap: 9 (ref 5–15)
BUN: 11 mg/dL (ref 6–20)
CO2: 21 mmol/L — ABNORMAL LOW (ref 22–32)
Calcium: 8.7 mg/dL — ABNORMAL LOW (ref 8.9–10.3)
Chloride: 109 mmol/L (ref 98–111)
Creatinine: 0.63 mg/dL (ref 0.44–1.00)
GFR, Estimated: >60 mL/min (ref >60)
Glucose, Bld: 93 mg/dL (ref 70–99)
Potassium: 4.1 mmol/L (ref 3.5–5.1)
Sodium: 139 mmol/L (ref 135–145)
Total Bilirubin: 0.3 mg/dL (ref 0.3–1.2)
Total Protein: 6.6 g/dL (ref 6.5–8.1)

## 2021-07-23 LAB — CBC WITH DIFFERENTIAL (CANCER CENTER ONLY)
Abs Immature Granulocytes: 0.41 10*3/uL — ABNORMAL HIGH (ref 0.00–0.07)
Basophils Absolute: 0.1 10*3/uL (ref 0.0–0.1)
Basophils Relative: 1 %
Eosinophils Absolute: 0 10*3/uL (ref 0.0–0.5)
Eosinophils Relative: 0 %
HCT: 33.7 % — ABNORMAL LOW (ref 36.0–46.0)
Hemoglobin: 11.5 g/dL — ABNORMAL LOW (ref 12.0–15.0)
Immature Granulocytes: 4 %
Lymphocytes Relative: 14 %
Lymphs Abs: 1.6 10*3/uL (ref 0.7–4.0)
MCH: 31.1 pg (ref 26.0–34.0)
MCHC: 34.1 g/dL (ref 30.0–36.0)
MCV: 91.1 fL (ref 80.0–100.0)
Monocytes Absolute: 0.5 10*3/uL (ref 0.1–1.0)
Monocytes Relative: 4 %
Neutro Abs: 8.8 10*3/uL — ABNORMAL HIGH (ref 1.7–7.7)
Neutrophils Relative %: 77 %
Platelet Count: 179 10*3/uL (ref 150–400)
RBC: 3.7 MIL/uL — ABNORMAL LOW (ref 3.87–5.11)
RDW: 11.9 % (ref 11.5–15.5)
WBC Count: 11.4 10*3/uL — ABNORMAL HIGH (ref 4.0–10.5)
nRBC: 0.2 % (ref 0.0–0.2)

## 2021-07-23 LAB — PREGNANCY, URINE: Preg Test, Ur: NEGATIVE

## 2021-07-23 MED ORDER — DEXAMETHASONE 1 MG PO TABS: ORAL_TABLET | ORAL | 1 refills | Status: DC

## 2021-07-23 MED ORDER — SODIUM CHLORIDE 0.9% FLUSH
10.0000 mL | INTRAVENOUS | Status: DC | PRN
  Administered 2021-07-23: 10 mL via INTRAVENOUS

## 2021-07-23 MED ORDER — SODIUM CHLORIDE 0.9 % IV SOLN
10.0000 mg | Freq: Once | INTRAVENOUS | Status: AC
  Administered 2021-07-23: 10 mg via INTRAVENOUS
  Filled 2021-07-23: qty 10

## 2021-07-23 MED ORDER — SODIUM CHLORIDE 0.9 % IV SOLN: Freq: Once | INTRAVENOUS | Status: AC

## 2021-07-23 MED ORDER — SODIUM CHLORIDE 0.9% FLUSH
10.0000 mL | INTRAVENOUS | Status: DC | PRN
  Administered 2021-07-23: 10 mL

## 2021-07-23 MED ORDER — PALONOSETRON HCL INJECTION 0.25 MG/5ML
0.2500 mg | Freq: Once | INTRAVENOUS | Status: AC
  Administered 2021-07-23: 0.25 mg via INTRAVENOUS
  Filled 2021-07-23: qty 5

## 2021-07-23 MED ORDER — SODIUM CHLORIDE 0.9 % IV SOLN
600.0000 mg/m2 | Freq: Once | INTRAVENOUS | Status: AC
  Administered 2021-07-23: 980 mg via INTRAVENOUS
  Filled 2021-07-23: qty 49

## 2021-07-23 MED ORDER — SODIUM CHLORIDE 0.9 % IV SOLN
150.0000 mg | Freq: Once | INTRAVENOUS | Status: AC
  Administered 2021-07-23: 150 mg via INTRAVENOUS
  Filled 2021-07-23: qty 150

## 2021-07-23 MED ORDER — HEPARIN SOD (PORK) LOCK FLUSH 100 UNIT/ML IV SOLN
500.0000 [IU] | Freq: Once | INTRAVENOUS | Status: AC | PRN
  Administered 2021-07-23: 500 [IU]

## 2021-07-23 MED ORDER — DOXORUBICIN HCL CHEMO IV INJECTION 2 MG/ML
60.0000 mg/m2 | Freq: Once | INTRAVENOUS | Status: AC
  Administered 2021-07-23: 98 mg via INTRAVENOUS
  Filled 2021-07-23: qty 49

## 2021-07-23 MED ORDER — ONDANSETRON HCL 8 MG PO TABS: 8.0000 mg | ORAL_TABLET | Freq: Three times a day (TID) | ORAL | 0 refills | Status: DC | PRN

## 2021-07-23 NOTE — Patient Instructions (Signed)
[facility]  Discharge Instructions: Thank you for choosing [facility] to provide your oncology and hematology care.   If you have a lab appointment with the [facility], please go directly to the [facility] and check in at the registration area.   Wear comfortable clothing and clothing appropriate for easy access to any Portacath or PICC line.   We strive to give you quality time with your provider. You may need to reschedule your appointment if you arrive late (15 or more minutes).  Arriving late affects you and other patients whose appointments are after yours.  Also, if you miss three or more appointments without notifying the office, you may be dismissed from the clinic at the provider's discretion.      For prescription refill requests, have your pharmacy contact our office and allow 72 hours for refills to be completed.    Today you received the following chemotherapy and/or immunotherapy agents: Doxorubicin (Adriamycin) and Cytoxan.   To help prevent nausea and vomiting after your treatment, we encourage you to take your nausea medication as directed.  BELOW ARE SYMPTOMS THAT SHOULD BE REPORTED IMMEDIATELY: *FEVER GREATER THAN 100.4 F (38 C) OR HIGHER *CHILLS OR SWEATING *NAUSEA AND VOMITING THAT IS NOT CONTROLLED WITH YOUR NAUSEA MEDICATION *UNUSUAL SHORTNESS OF BREATH *UNUSUAL BRUISING OR BLEEDING *URINARY PROBLEMS (pain or burning when urinating, or frequent urination) *BOWEL PROBLEMS (unusual diarrhea, constipation, pain near the anus) TENDERNESS IN MOUTH AND THROAT WITH OR WITHOUT PRESENCE OF ULCERS (sore throat, sores in mouth, or a toothache) UNUSUAL RASH, SWELLING OR PAIN  UNUSUAL VAGINAL DISCHARGE OR ITCHING   Items with * indicate a potential emergency and should be followed up as soon as possible or go to the Emergency Department if any problems should occur.  Please show the CHEMOTHERAPY ALERT CARD or IMMUNOTHERAPY  ALERT CARD at check-in to the Emergency Department and triage nurse.  Should you have questions after your visit or need to cancel or reschedule your appointment, please contact [facility]  Dept: [phone]  and follow the prompts.  Office hours are 8:00 a.m. to 4:30 p.m. Monday - Friday. Please note that voicemails left after 4:00 p.m. may not be returned until the following business day.  We are closed weekends and major holidays. You have access to a nurse at all times for urgent questions. Please call the main number to the clinic Dept: [phone] and follow the prompts.   For any non-urgent questions, you may also contact your provider using MyChart. We now offer e-Visits for anyone 66 and older to request care online for non-urgent symptoms. For details visit [email or web address].   Also download the MyChart app! Go to the app store, search "MyChart", open the app, select [facility], and log in with your MyChart username and password.  Due to Covid, a mask is required upon entering the hospital/clinic. If you do not have a mask, one will be given to you upon arrival. For doctor visits, patients may have 1 support person aged 73 or older with them. For treatment visits, patients cannot have anyone with them due to current Covid guidelines and our immunocompromised population.

## 2021-07-25 ENCOUNTER — Other Ambulatory Visit: Payer: Self-pay

## 2021-07-25 ENCOUNTER — Inpatient Hospital Stay: Payer: No Typology Code available for payment source

## 2021-07-25 VITALS — BP 110/74 | HR 92 | Temp 98.5°F | Resp 16

## 2021-07-25 DIAGNOSIS — Z170 Estrogen receptor positive status [ER+]: Secondary | ICD-10-CM

## 2021-07-25 DIAGNOSIS — C50411 Malignant neoplasm of upper-outer quadrant of right female breast: Secondary | ICD-10-CM

## 2021-07-25 MED ORDER — PEGFILGRASTIM-BMEZ 6 MG/0.6ML ~~LOC~~ SOSY
6.0000 mg | PREFILLED_SYRINGE | Freq: Once | SUBCUTANEOUS | Status: AC
  Administered 2021-07-25: 6 mg via SUBCUTANEOUS

## 2021-07-31 ENCOUNTER — Telehealth: Payer: Self-pay | Admitting: *Deleted

## 2021-07-31 DIAGNOSIS — Z170 Estrogen receptor positive status [ER+]: Secondary | ICD-10-CM

## 2021-07-31 DIAGNOSIS — C50411 Malignant neoplasm of upper-outer quadrant of right female breast: Secondary | ICD-10-CM

## 2021-07-31 DIAGNOSIS — K641 Second degree hemorrhoids: Secondary | ICD-10-CM

## 2021-07-31 MED ORDER — DOXYCYCLINE HYCLATE 100 MG PO TABS: 100.0000 mg | ORAL_TABLET | Freq: Two times a day (BID) | ORAL | 0 refills | Status: DC

## 2021-07-31 NOTE — Telephone Encounter (Signed)
This RN spoke with pt per her call stating onset of upper respiratory symptoms including mild sore throat and low grade temps not greater then 100.  She states symptoms started a " few days ago " with some progression.  She is currently d9 of c3 of adria/ctx with peg filgrastin support.  She is not using OTC medications to alleviate symptoms.  She has not Covid tested.  Per discussion this RN advised of what OTC meds are appropriate for use as well as reviewed with [facility]/NP with recommendation for pt to start antibiotic for coverage and she should covid test just to verify status.  Pt verbalized understanding of above plan as well as to call if symptoms worsen or fever goes above 100.5.

## 2021-08-03 ENCOUNTER — Telehealth: Payer: Self-pay | Admitting: *Deleted

## 2021-08-03 NOTE — Telephone Encounter (Signed)
This RN returned call to the patient per her VM stating ongoing issues of symptoms of congestion and cough - with her son testing positive for flu today per his visit at the pediatric MD.  Of note pt called Friday 11/18 with onset of her symptoms.  Ebony Lamb states she overall is " not feeling worse " but does have abd discomfort and decreased appetite.  She is currently on doxyclicline per symptoms reported on 11/18.  She denies any temps greater then 100 " just running in the 99's "  She is scheduled for a video visit this Wednesday and cycle 4 of A/C this Friday..  This note will be given to MD.  Return call number given as [phone]

## 2021-08-04 ENCOUNTER — Encounter: Payer: Self-pay | Admitting: *Deleted

## 2021-08-04 NOTE — Progress Notes (Signed)
[facility]  [phone] [phone]    ID: Ebony Lamb DOB: Dec 06, 1975  MR#: [id number]  VAN#:[id number]  Patient Care Team: Antony Contras, MD as PCP - General (Family Medicine) Coralie Keens, MD as Consulting Physician (General Surgery) Neeko Pharo, Virgie Dad, MD as Consulting Physician (Oncology) Eppie Gibson, MD as Attending Physician (Radiation Oncology) Rockwell Germany, RN as Oncology Nurse Navigator Mauro Kaufmann, RN as Oncology Nurse Navigator Rozetta Nunnery, MD as Consulting Physician (Otolaryngology) Sanjuana Kava, MD as Referring Physician (Obstetrics and Gynecology) [name], MD as Consulting Physician (Plastic Surgery) Aurea Graff OTHER MD:  I connected with Ebony Lamb on 08/04/21 at  8:30 AM EST by video enabled telemedicine visit and verified that I am speaking with the correct person using two identifiers.   I discussed the limitations, risks, security and privacy concerns of performing an evaluation and management service by telemedicine and the availability of in-person appointments. I also discussed with the patient that there may be a patient responsible charge related to this service. The patient expressed understanding and agreed to proceed.   Other persons participating in the visit and their role in the encounter: None  Patient's location: Home Provider's location: [facility]   I provided 20 minutes of face-to-face video visit time during this encounter, and > 50% was spent counseling as documented under my assessment & plan.   CHIEF COMPLAINT: Weakly estrogen receptor positive breast cancer  CURRENT TREATMENT: Neoadjuvant chemotherapy   INTERVAL HISTORY: Ebony Lamb was contacted today for follow up of her weakly estrogen receptor positive breast cancer.    She started neoadjuvant chemotherapy 06/25/2021 with doxorubicin and cyclophosphamide.  She is scheduled to receive cycle 4 on  08/07/2021.  Since I will not be here that day nor my nurse practitioner we decided to do a virtual visit and decide whether or not to proceed on 1125 remove the date  She is scheduled for right breast ultrasound on 08/11/2021.   REVIEW OF SYSTEMS: Arly tells me her children have been diagnosed with the flu and she herself has had a temperature, only slightly above 100.  It is 99.5 this morning.  She feels generally okay although she has had something of a sore throat, some congestion, but no significant cough or shortness of breath.  A detailed review of systems was otherwise stable.   COVID 19 VACCINATION STATUS: Pfizer x3; infection 01/2021   HISTORY OF CURRENT ILLNESS: From the original intake note:  Ebony Lamb (pronounced "REECE") has a history of previous right breast biopsy (8 o'clock) in 07/2019 that was negative.  More recently she presented with a palpable right breast mass at 12 o'clock. She underwent right diagnostic mammography with tomography and right breast ultrasonography at [facility] on 06/01/2021 showing: breast density category D; palpable 3 cm irregular mass in right breast at 12 o'clock; possible 5 mm satellite lesion located 2.5 cm from dominant mass; abnormal right axillary lymph node.  Accordingly on 06/04/2021 she proceeded to biopsy of the right breast area in question. The pathology from this procedure ([id number]) showed: invasive ductal carcinoma, grade 3. Prognostic indicators significant for: estrogen receptor, 15% positive and progesterone receptor, 15% positive, both with weak-moderate staining intensity. Proliferation marker Ki67 at 70%. HER2 equivocal by immunohistochemistry (2+), but negative by fluorescent in situ hybridization with a signals ratio 1.47 and number per cell 2.50.  Biopsy of the questionable lymph node was negative and concordant   Cancer Staging  Malignant neoplasm of upper-outer quadrant of right breast in female, estrogen receptor  positive ([name]) Staging form: Breast, AJCC 8th Edition - Clinical stage from 06/10/2021: Stage IIA (cT2, cN0, cM0, G3, ER+, PR+, HER2-) - Signed by Chauncey Cruel, MD on 06/10/2021 Stage prefix: Initial diagnosis Histologic grading system: 3 grade system Laterality: Right Staged by: Pathologist and managing physician Stage used in treatment planning: Yes National guidelines used in treatment planning: Yes Type of national guideline used in treatment planning: NCCN  The patient's subsequent history is as detailed below.   PAST MEDICAL HISTORY: Past Medical History:  Diagnosis Date   Abnormal Pap smear 09/13/2000   Allergy 02/2020   Seasonal   Cancer ([place])    Depression    history of depression- denies currently   H/O varicella    Hypertension 09/14/2011   post partum- meds x 1 mo   No pertinent past medical history    Preterm labor     PAST SURGICAL HISTORY: Past Surgical History:  Procedure Laterality Date   BREAST BIOPSY Right 07/27/2019   CESAREAN SECTION  11/14/2011   Procedure: CESAREAN SECTION;  Surgeon: Eli Hose, MD;  Location: [facility];  Service: Gynecology;  Laterality: N/A;  Primary cesarean section with delivery of baby boy at [phone]. Apgars 9/9.   CESAREAN SECTION N/A 03/22/2014   Procedure: Repeat CESAREAN SECTION;  Surgeon: Alwyn Pea, MD;  Location: [facility];  Service: Obstetrics;  Laterality: N/A;   COSMETIC SURGERY  11/22/2017   Abdominoplasty   FOOT SURGERY     FRACTURE SURGERY  08/1999   Repair of 5th metatarsal in left foot   HERNIA REPAIR  11/22/2017   PORTACATH PLACEMENT N/A 06/24/2021   Procedure: INSERTION PORT-A-CATH;  Surgeon: Coralie Keens, MD;  Location: [facility];  Service: General;  Laterality: N/A;   TUBAL LIGATION  11/22/2017   Tubes were completely removed   WISDOM TOOTH EXTRACTION      FAMILY HISTORY: Family History  Problem Relation Age of Onset   Breast cancer Mother 47   Osteopenia Mother     Arthritis Mother    Other Mother        BRCA2 gene mutation   Hypertension Father    Kidney disease Father    Depression Father    Hyperlipidemia Father    Melanoma Father 18   Hypertension Brother    Hyperlipidemia Brother    Breast cancer Maternal Grandmother        dx. late 30s/40s   Heart disease Maternal Grandfather    Other Maternal Grandfather        BRCA2 gene mutation   Ovarian cancer Paternal Grandmother        dx. 41s   Stroke Paternal Grandfather    Birth defects Son    Anesthesia problems Neg Hx    Diabetes Neg Hx    Her parents are both living, her father age 14 and her mother age 26 as of 05/2021. Al has one brother (and no sisters). She reports breast cancer in her mother at age 66 and her maternal grandmother in her late 67's, melanoma in her father at age 50, and ovarian cancer in her paternal grandmother in her 18's.   GYNECOLOGIC HISTORY:  No LMP recorded. Menarche: 45 years old Age at first live birth: 45 years old [place] P 2 LMP 06/06/2021 Contraceptive: used from 1996-2003 and 2006-2012 HRT n/a  Hysterectomy? no BSO? 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 Husband [name] work for [employer] in [employer]. She lives at home with [name] and their two children-- Tiffany Kocher, age 1, and Cori Razor, age 17. 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    ADVANCED DIRECTIVES: in place   HEALTH MAINTENANCE: Social History   Tobacco Use   Smoking status: Never   Smokeless tobacco: Never  Vaping Use   Vaping Use: Never used  Substance Use Topics   Alcohol use: Yes    Alcohol/week: 7.0 standard drinks    Types: 7 Standard drinks or equivalent per week    Comment: I stopped drinking alcohol after finding the lump in my brea   Drug use: No     Colonoscopy: never done (age)  PAP: 06/2020  Bone density: never done (age)   No Known Allergies  Current Outpatient Medications  Medication Sig  Dispense Refill   albuterol (VENTOLIN HFA) 108 (90 Base) MCG/ACT inhaler Inhale 2 puffs into the lungs every 6 (six) hours as needed for wheezing or shortness of breath. 8 g 2   dexamethasone (DECADRON) 1 MG tablet Take 1 or 2 tablets twice daily as instructed (together with 4 mg dexamethasone tablet) 20 tablet 1   dexamethasone (DECADRON) 4 MG tablet Take 1 tablet (4 mg total) by mouth 2 (two) times daily. Take daily for 3 days after chemo. Take with food. 30 tablet 1   doxycycline (VIBRA-TABS) 100 MG tablet Take 1 tablet (100 mg total) by mouth 2 (two) times daily. 10 tablet 0   doxycycline (VIBRA-TABS) 100 MG tablet Take 1 tablet (100 mg total) by mouth 2 (two) times daily. 14 tablet 0   lidocaine-prilocaine (EMLA) cream Apply to affected area once 30 g 3   loratadine (CLARITIN) 10 MG tablet Take 1 tablet (10 mg total) by mouth daily. Take day post chemo and then daily for 10 days with each treatment 30 tablet 3   LORazepam (ATIVAN) 0.5 MG tablet Take 1 tablet (0.5 mg total) by mouth at bedtime as needed (Nausea or vomiting). 30 tablet 0   ondansetron (ZOFRAN) 8 MG tablet Take 1 tablet (8 mg total) by mouth every 8 (eight) hours as needed for nausea or vomiting. May take starting day 3 of chemotherapy 20 tablet 0   pramoxine-hydrocortisone cream Apply topically 3 (three) times daily. 57 g 0   prochlorperazine (COMPAZINE) 10 MG tablet Take 1 tablet (10 mg total) by mouth every 6 (six) hours as needed (Nausea or vomiting). 30 tablet 1   traMADol (ULTRAM) 50 MG tablet Take 1-2 tablets (50-100 mg total) by mouth every 6 (six) hours as needed. 20 tablet 0   No current facility-administered medications for this visit.   Facility-Administered Medications Ordered in Other Visits  Medication Dose Route Frequency Provider Last Rate Last Admin   influenza vac split quadrivalent PF (FLUARIX) 0.5 ML injection            influenza vac split quadrivalent PF (FLUARIX) injection 0.5 mL  0.5 mL Intramuscular  Once Brownie Gockel, Virgie Dad, MD        OBJECTIVE: White woman who appears stated age  There were no vitals filed for this visit.     There is no height or weight on file to calculate BMI.   Wt Readings from Last 3 Encounters:  07/23/21 121 lb 12.8 oz (55.2 kg)  07/09/21 121 lb 4.8 oz (55 kg)  06/30/21 126 lb 3.2 oz (57.2 kg)  ECOG FS:1 - Symptomatic but completely ambulatory  Telemedicine visit 08/05/2021      LAB RESULTS:  CMP     Component Value Date/Time   NA 139 07/23/2021 0819   K 4.1 07/23/2021 0819   CL 109 07/23/2021 0819   CO2 21 (L) 07/23/2021 0819   GLUCOSE 93 07/23/2021 0819   BUN 11 07/23/2021 0819   CREATININE 0.63 07/23/2021 0819   CALCIUM 8.7 (L) 07/23/2021 0819   PROT 6.6 07/23/2021 0819   ALBUMIN 3.8 07/23/2021 0819   AST 18 07/23/2021 0819   ALT 19 07/23/2021 0819   ALKPHOS 107 07/23/2021 0819   BILITOT 0.3 07/23/2021 0819   GFRNONAA >60 07/23/2021 0819   GFRAA >90 11/26/2011 2008    No results found for: TOTALPROTELP, ALBUMINELP, A1GS, A2GS, BETS, BETA2SER, GAMS, MSPIKE, SPEI  Lab Results  Component Value Date   WBC 11.4 (H) 07/23/2021   NEUTROABS 8.8 (H) 07/23/2021   HGB 11.5 (L) 07/23/2021   HCT 33.7 (L) 07/23/2021   MCV 91.1 07/23/2021   PLT 179 07/23/2021    No results found for: LABCA2  No components found for: WRUEAV409  No results for input(s): INR in the last 168 hours.  No results found for: LABCA2  No results found for: WJX914  No results found for: NWG956  No results found for: OZH086  No results found for: CA2729  No components found for: HGQUANT  No results found for: CEA1 / No results found for: CEA1   No results found for: AFPTUMOR  No results found for: CHROMOGRNA  No results found for: KPAFRELGTCHN, LAMBDASER, KAPLAMBRATIO (kappa/lambda light chains)  No results found for: HGBA, HGBA2QUANT, HGBFQUANT, HGBSQUAN (Hemoglobinopathy evaluation)   Lab Results  Component Value Date   LDH 223  11/26/2011    No results found for: IRON, TIBC, IRONPCTSAT (Iron and TIBC)  No results found for: FERRITIN  Urinalysis    Component Value Date/Time   COLORURINE YELLOW 11/26/2011 2000   APPEARANCEUR CLEAR 11/26/2011 2000   LABSPEC <1.005 (L) 11/26/2011 2000   PHURINE 6.5 11/26/2011 2000   GLUCOSEU NEGATIVE 11/26/2011 2000   HGBUR SMALL (A) 11/26/2011 2000   BILIRUBINUR NEGATIVE 11/26/2011 2000   KETONESUR NEGATIVE 11/26/2011 2000   PROTEINUR NEGATIVE 11/26/2011 2000   UROBILINOGEN 0.2 11/26/2011 2000   NITRITE NEGATIVE 11/26/2011 2000   LEUKOCYTESUR MODERATE (A) 11/26/2011 2000    STUDIES: No results found.   ELIGIBLE FOR AVAILABLE RESEARCH PROTOCOL: no  ASSESSMENT: 45 y.o. [place] woman status post right breast upper outer quadrant biopsy 06/04/2021 for a clinical mT2 N0 invasive ductal carcinoma, grade 3, estrogen and progesterone receptor weakly positive, HER2 not amplified, with an MIB-1 of 70%  (A) chest CT with contrast 06/22/2021 shows right breast mass, no other lesions of concern; pulmonary nodules all less than 5 mm noted, likely benign  (B) bone scan 06/22/2021 negative for metastases; consistent with sacroiliitis  (1) genetics testing: (A) [facility] BRCAplus dated 06/16/2021 finds no deleterious mutations then in ATM, BRCA1, BRCA2, CDH1, CHEK2, PALB2, PTEN, and TP53 (B) The [facility]-Expanded gene panel obtained 06/19/2021 showed no deleterious mutations in AIP, ALK, APC, ATM, AXIN2, BAP1, BARD1, BLM, BMPR1A, BRCA1, BRCA2, BRIP1, CDC73, CDH1, CDK4, CDKN1B, CDKN2A, CHEK2, CTNNA1, DICER1, FANCC, FH, FLCN, GALNT12, KIF1B, LZTR1, MAX, MEN1, MET, MLH1, MSH2, MSH3, MSH6, MUTYH, NBN, NF1, NF2, NTHL1, PALB2, PHOX2B, PMS2, POT1, PRKAR1A, PTCH1, PTEN, RAD51C, RAD51D, RB1, RECQL, RET, SDHA, SDHAF2, SDHB, SDHC, SDHD, SMAD4, SMARCA4, SMARCB1, SMARCE1, STK11, SUFU, TMEM127, TP53, TSC1, TSC2, VHL and  XRCC2 (sequencing and deletion/duplication); EGFR, EGLN1, HOXB13, KIT, MITF,  PDGFRA, POLD1, and POLE (sequencing only); EPCAM and GREM1 (deletion/duplication only). (C)  a variant of uncertain significance was identified in the PTCH1 gene  (2) neoadjuvant chemotherapy consisting of doxorubicin and cyclophosphamide in dose dense fashion x4 started 0/13/2022, to be followed by weekly paclitaxel x12  (3) definitive surgery to follow  (4) adjuvant radiation as appropriate  (5) antiestrogens   PLAN: Given the fact that the family is experiencing an outbreak of the fluid including the patient's children and possibly the patient herself (she has not been tested) I do not think it is a good idea to proceed with treatment 1125 as originally planned.  Accordingly I have canceled that appointment.  Instead as she will be treated 08/10/2021.  She will see me that day.  We will correct the rest of her schedule at that time.  She developed significant symptoms certainly we could consider treatment of her flu but at this point she appears to be doing well (and has not been definitively diagnosed with it).  Total encounter time 20 minutes.Sarajane Jews C. Mirely Pangle, MD 08/04/21 10:13 PM Medical Oncology and Hematology [facility] [place], [place] [address] Tel. [phone]    Fax. [phone]   I, Wilburn Mylar, am acting as scribe for Dr. Virgie Dad. [place].  I, Lurline Del MD, have reviewed the above documentation for accuracy and completeness, and I agree with the above.   *Total Encounter Time as defined by the Centers for Medicare and Medicaid Services includes, in addition to the face-to-face time of a patient visit (documented in the note above) non-face-to-face time: obtaining and reviewing outside history, ordering and reviewing medications, tests or procedures, care coordination (communications with other health care professionals or caregivers) and documentation in the medical record.

## 2021-08-05 ENCOUNTER — Telehealth (HOSPITAL_BASED_OUTPATIENT_CLINIC_OR_DEPARTMENT_OTHER): Payer: No Typology Code available for payment source | Admitting: Oncology

## 2021-08-05 ENCOUNTER — Ambulatory Visit: Payer: No Typology Code available for payment source

## 2021-08-05 ENCOUNTER — Other Ambulatory Visit: Payer: No Typology Code available for payment source

## 2021-08-05 ENCOUNTER — Encounter: Payer: No Typology Code available for payment source | Admitting: Dietician

## 2021-08-05 DIAGNOSIS — Z170 Estrogen receptor positive status [ER+]: Secondary | ICD-10-CM | POA: Diagnosis not present

## 2021-08-05 DIAGNOSIS — C50411 Malignant neoplasm of upper-outer quadrant of right female breast: Secondary | ICD-10-CM

## 2021-08-05 MED FILL — Dexamethasone Sodium Phosphate Inj 100 MG/10ML: INTRAMUSCULAR | Qty: 1 | Status: AC

## 2021-08-05 MED FILL — Fosaprepitant Dimeglumine For IV Infusion 150 MG (Base Eq): INTRAVENOUS | Qty: 5 | Status: AC

## 2021-08-07 ENCOUNTER — Encounter: Payer: No Typology Code available for payment source | Admitting: Dietician

## 2021-08-07 ENCOUNTER — Ambulatory Visit: Payer: No Typology Code available for payment source

## 2021-08-07 ENCOUNTER — Other Ambulatory Visit: Payer: No Typology Code available for payment source

## 2021-08-10 ENCOUNTER — Inpatient Hospital Stay: Payer: No Typology Code available for payment source

## 2021-08-10 ENCOUNTER — Other Ambulatory Visit: Payer: Self-pay

## 2021-08-10 ENCOUNTER — Ambulatory Visit: Payer: No Typology Code available for payment source

## 2021-08-10 VITALS — BP 118/84 | HR 86 | Temp 98.4°F | Resp 18

## 2021-08-10 DIAGNOSIS — Z95828 Presence of other vascular implants and grafts: Secondary | ICD-10-CM | POA: Insufficient documentation

## 2021-08-10 DIAGNOSIS — C50411 Malignant neoplasm of upper-outer quadrant of right female breast: Secondary | ICD-10-CM

## 2021-08-10 DIAGNOSIS — Z170 Estrogen receptor positive status [ER+]: Secondary | ICD-10-CM

## 2021-08-10 HISTORY — DX: Presence of other vascular implants and grafts: Z95.828

## 2021-08-10 LAB — CBC WITH DIFFERENTIAL (CANCER CENTER ONLY)
Abs Immature Granulocytes: 0.1 10*3/uL — ABNORMAL HIGH (ref 0.00–0.07)
Basophils Absolute: 0.1 10*3/uL (ref 0.0–0.1)
Basophils Relative: 1 %
Eosinophils Absolute: 0 10*3/uL (ref 0.0–0.5)
Eosinophils Relative: 0 %
HCT: 32.8 % — ABNORMAL LOW (ref 36.0–46.0)
Hemoglobin: 10.8 g/dL — ABNORMAL LOW (ref 12.0–15.0)
Immature Granulocytes: 1 %
Lymphocytes Relative: 10 %
Lymphs Abs: 0.8 10*3/uL (ref 0.7–4.0)
MCH: 30.4 pg (ref 26.0–34.0)
MCHC: 32.9 g/dL (ref 30.0–36.0)
MCV: 92.4 fL (ref 80.0–100.0)
Monocytes Absolute: 0.5 10*3/uL (ref 0.1–1.0)
Monocytes Relative: 6 %
Neutro Abs: 6.5 10*3/uL (ref 1.7–7.7)
Neutrophils Relative %: 82 %
Platelet Count: 227 10*3/uL (ref 150–400)
RBC: 3.55 MIL/uL — ABNORMAL LOW (ref 3.87–5.11)
RDW: 14.3 % (ref 11.5–15.5)
WBC Count: 7.9 10*3/uL (ref 4.0–10.5)
nRBC: 0 % (ref 0.0–0.2)

## 2021-08-10 LAB — CMP (CANCER CENTER ONLY)
ALT: 19 U/L (ref 0–44)
AST: 20 U/L (ref 15–41)
Albumin: 3.9 g/dL (ref 3.5–5.0)
Alkaline Phosphatase: 92 U/L (ref 38–126)
Anion gap: 9 (ref 5–15)
BUN: 11 mg/dL (ref 6–20)
CO2: 23 mmol/L (ref 22–32)
Calcium: 9 mg/dL (ref 8.9–10.3)
Chloride: 109 mmol/L (ref 98–111)
Creatinine: 0.67 mg/dL (ref 0.44–1.00)
GFR, Estimated: >60 mL/min (ref >60)
Glucose, Bld: 103 mg/dL — ABNORMAL HIGH (ref 70–99)
Potassium: 3.8 mmol/L (ref 3.5–5.1)
Sodium: 141 mmol/L (ref 135–145)
Total Bilirubin: 0.4 mg/dL (ref 0.3–1.2)
Total Protein: 6.5 g/dL (ref 6.5–8.1)

## 2021-08-10 LAB — PREGNANCY, URINE: Preg Test, Ur: NEGATIVE

## 2021-08-10 MED ORDER — SODIUM CHLORIDE 0.9 % IV SOLN
150.0000 mg | Freq: Once | INTRAVENOUS | Status: AC
  Administered 2021-08-10: 09:00:00 150 mg via INTRAVENOUS
  Filled 2021-08-10: qty 150

## 2021-08-10 MED ORDER — SODIUM CHLORIDE 0.9 % IV SOLN
600.0000 mg/m2 | Freq: Once | INTRAVENOUS | Status: AC
  Administered 2021-08-10: 10:00:00 980 mg via INTRAVENOUS
  Filled 2021-08-10: qty 49

## 2021-08-10 MED ORDER — SODIUM CHLORIDE 0.9 % IV SOLN: Freq: Once | INTRAVENOUS | Status: AC

## 2021-08-10 MED ORDER — DOXORUBICIN HCL CHEMO IV INJECTION 2 MG/ML
60.0000 mg/m2 | Freq: Once | INTRAVENOUS | Status: AC
  Administered 2021-08-10: 10:00:00 98 mg via INTRAVENOUS
  Filled 2021-08-10: qty 49

## 2021-08-10 MED ORDER — OSELTAMIVIR PHOSPHATE 75 MG PO CAPS: 75.0000 mg | ORAL_CAPSULE | Freq: Two times a day (BID) | ORAL | 0 refills | Status: DC

## 2021-08-10 MED ORDER — SODIUM CHLORIDE 0.9% FLUSH
10.0000 mL | Freq: Once | INTRAVENOUS | Status: AC
  Administered 2021-08-10: 08:00:00 10 mL

## 2021-08-10 MED ORDER — PALONOSETRON HCL INJECTION 0.25 MG/5ML
0.2500 mg | Freq: Once | INTRAVENOUS | Status: AC
  Administered 2021-08-10: 09:00:00 0.25 mg via INTRAVENOUS
  Filled 2021-08-10: qty 5

## 2021-08-10 MED ORDER — SODIUM CHLORIDE 0.9 % IV SOLN
10.0000 mg | Freq: Once | INTRAVENOUS | Status: AC
  Administered 2021-08-10: 09:00:00 10 mg via INTRAVENOUS
  Filled 2021-08-10: qty 10

## 2021-08-10 NOTE — Patient Instructions (Signed)
[facility]  Discharge Instructions: Thank you for choosing [facility] to provide your oncology and hematology care.   If you have a lab appointment with the [facility], please go directly to the [facility] and check in at the registration area.   Wear comfortable clothing and clothing appropriate for easy access to any Portacath or PICC line.   We strive to give you quality time with your provider. You may need to reschedule your appointment if you arrive late (15 or more minutes).  Arriving late affects you and other patients whose appointments are after yours.  Also, if you miss three or more appointments without notifying the office, you may be dismissed from the clinic at the provider's discretion.      For prescription refill requests, have your pharmacy contact our office and allow 72 hours for refills to be completed.    Today you received the following chemotherapy and/or immunotherapy agents: Doxorubicin (Adriamycin) and Cytoxan.   To help prevent nausea and vomiting after your treatment, we encourage you to take your nausea medication as directed.  BELOW ARE SYMPTOMS THAT SHOULD BE REPORTED IMMEDIATELY: *FEVER GREATER THAN 100.4 F (38 C) OR HIGHER *CHILLS OR SWEATING *NAUSEA AND VOMITING THAT IS NOT CONTROLLED WITH YOUR NAUSEA MEDICATION *UNUSUAL SHORTNESS OF BREATH *UNUSUAL BRUISING OR BLEEDING *URINARY PROBLEMS (pain or burning when urinating, or frequent urination) *BOWEL PROBLEMS (unusual diarrhea, constipation, pain near the anus) TENDERNESS IN MOUTH AND THROAT WITH OR WITHOUT PRESENCE OF ULCERS (sore throat, sores in mouth, or a toothache) UNUSUAL RASH, SWELLING OR PAIN  UNUSUAL VAGINAL DISCHARGE OR ITCHING   Items with * indicate a potential emergency and should be followed up as soon as possible or go to the Emergency Department if any problems should occur.  Please show the CHEMOTHERAPY ALERT CARD or IMMUNOTHERAPY  ALERT CARD at check-in to the Emergency Department and triage nurse.  Should you have questions after your visit or need to cancel or reschedule your appointment, please contact [facility]  Dept: [phone]  and follow the prompts.  Office hours are 8:00 a.m. to 4:30 p.m. Monday - Friday. Please note that voicemails left after 4:00 p.m. may not be returned until the following business day.  We are closed weekends and major holidays. You have access to a nurse at all times for urgent questions. Please call the main number to the clinic Dept: [phone] and follow the prompts.   For any non-urgent questions, you may also contact your provider using MyChart. We now offer e-Visits for anyone 66 and older to request care online for non-urgent symptoms. For details visit [email or web address].   Also download the MyChart app! Go to the app store, search "MyChart", open the app, select [facility], and log in with your MyChart username and password.  Due to Covid, a mask is required upon entering the hospital/clinic. If you do not have a mask, one will be given to you upon arrival. For doctor visits, patients may have 1 support person aged 73 or older with them. For treatment visits, patients cannot have anyone with them due to current Covid guidelines and our immunocompromised population.

## 2021-08-10 NOTE — Progress Notes (Signed)
Nutrition Follow-up:  Patient with breast cancer receiving neoadjuvant chemotherapy.    Met with patient during infusion.  Patient reports that her appetite decreased following treatment but continued to stay decreased due to flu like illness.  Reports that on Thanksgiving day appetite increased and she has been eating more.  Eating lots of fruits and vegetables, oatmeal, toast with walnut butter, chicken and rice, scrambled eggs, bread dipped in olive oil.  Has not tried [employer] yet.      Medications: reviewed  Labs: reviewed  Anthropometrics:   Weight:No new weight today   NUTRITION DIAGNOSIS: Unintentional weight loss stable   INTERVENTION:  Encouraged heart healthy fats for added calories (ie extra virgin olive oils, nuts and nut butters, etc).   Discussed trying [employer] shake as base of smoothie.   Stressed importance of weight maintenance  MONITORING, EVALUATION, GOAL: weight trends, intake   NEXT VISIT: Thursday, Dec 8th during infuison  Cope Marte B. Zenia Resides, [place], [place] Registered Dietitian [phone] (mobile)

## 2021-08-10 NOTE — Progress Notes (Signed)
Per MD pt to take tamiflu as her family members have tested positive for flu.

## 2021-08-11 ENCOUNTER — Ambulatory Visit
Admission: RE | Admit: 2021-08-11 | Discharge: 2021-08-11 | Disposition: A | Discharge status: Home / Self Care | Payer: No Typology Code available for payment source | Source: Ambulatory Visit | Attending: Oncology | Admitting: Oncology

## 2021-08-11 ENCOUNTER — Other Ambulatory Visit: Payer: Self-pay

## 2021-08-11 ENCOUNTER — Inpatient Hospital Stay: Payer: No Typology Code available for payment source

## 2021-08-11 VITALS — BP 116/77 | HR 84 | Temp 98.8°F | Resp 16

## 2021-08-11 DIAGNOSIS — C50411 Malignant neoplasm of upper-outer quadrant of right female breast: Secondary | ICD-10-CM

## 2021-08-11 IMAGING — US US BREAST*R* LIMITED INC AXILLA
1 series · 12 of 18 positions shown · non-contrast
Comparison: Previous exam(s).
COMPARISON: Previous exam(s).

Addendum:
CLINICAL DATA: 45-year-old female with recently diagnosed right
breast malignancy presents for ultrasound evaluation after
neoadjuvant chemotherapy.

EXAM:
ULTRASOUND OF THE RIGHT BREAST

[Series 1: us breast*right* limited inc axilla · 0.06mm/px · 12 of 18 slices shown]
[im 1/18]
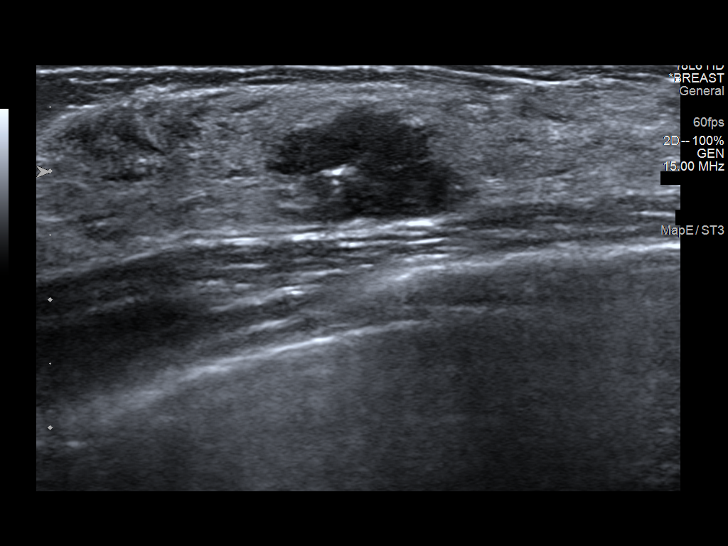
[im 3/18]
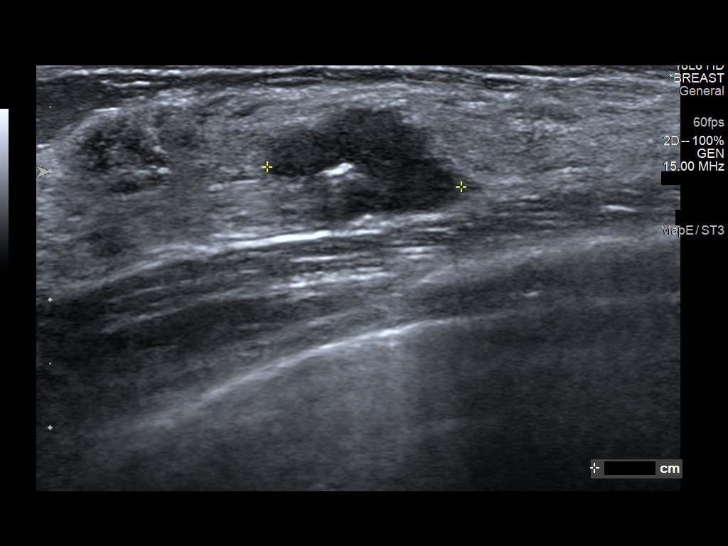
[im 4/18]
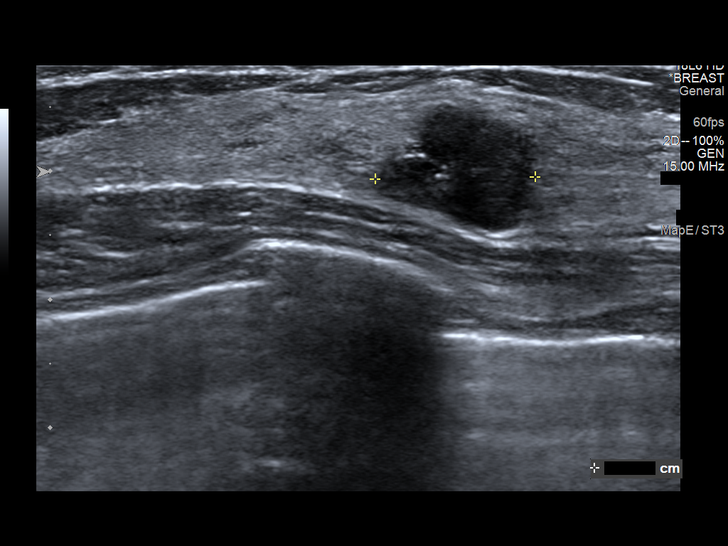
[im 6/18]
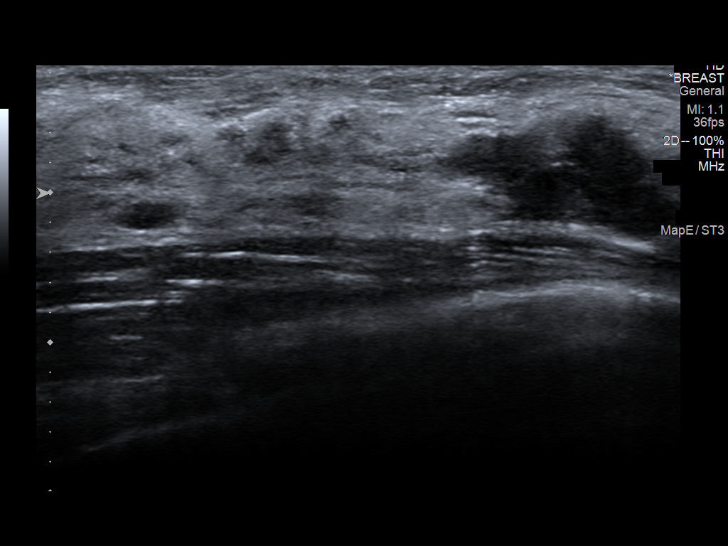
[im 7/18]
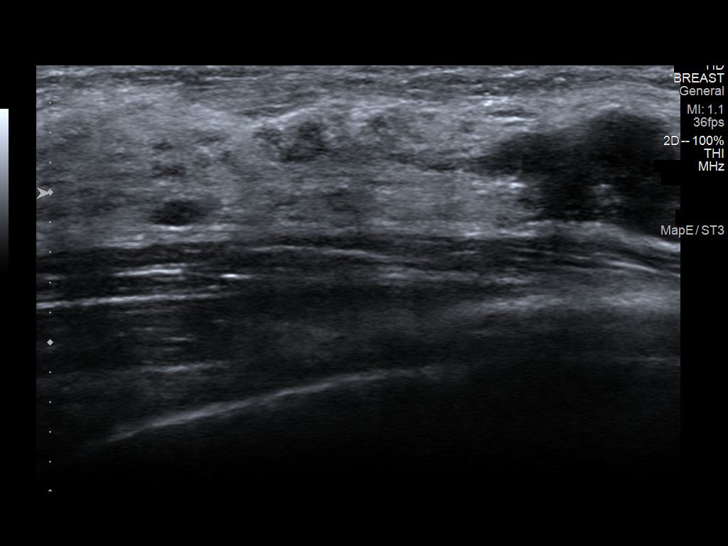
[im 9/18]
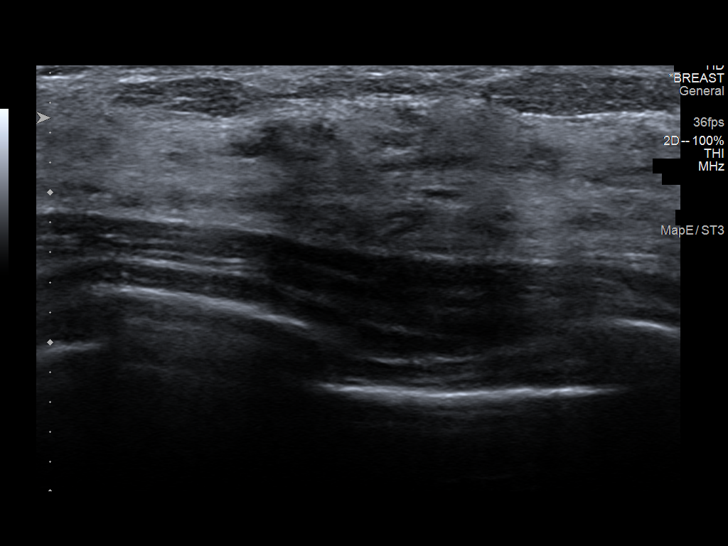
[im 10/18]
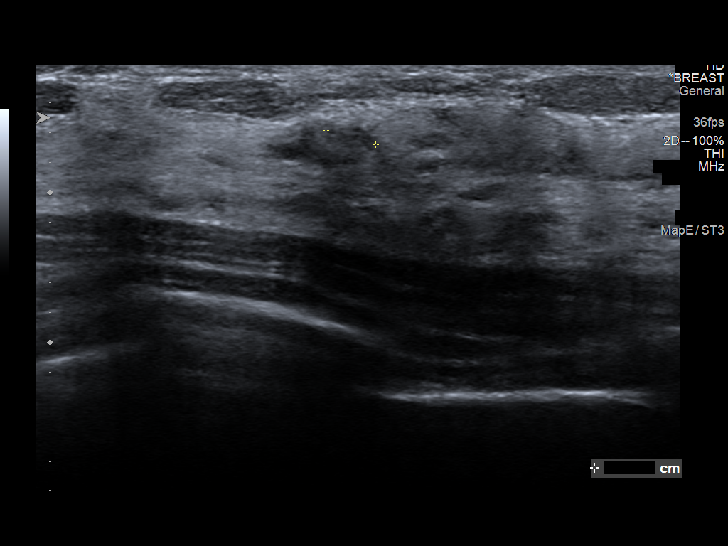
[im 12/18]
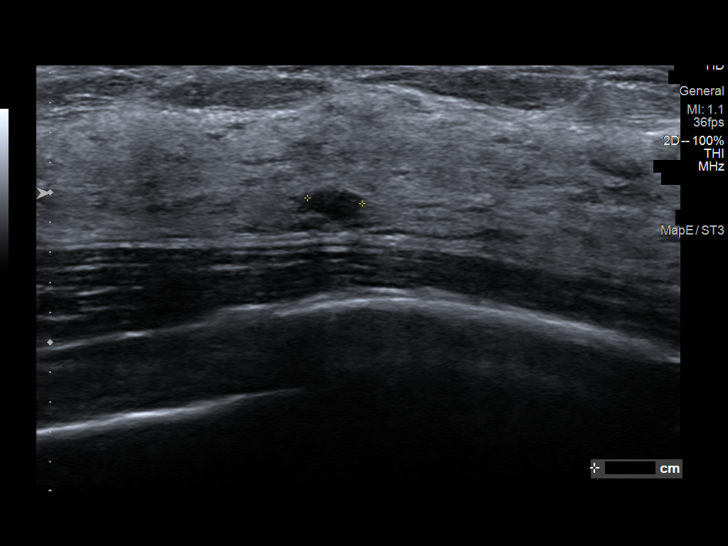
[im 13/18]
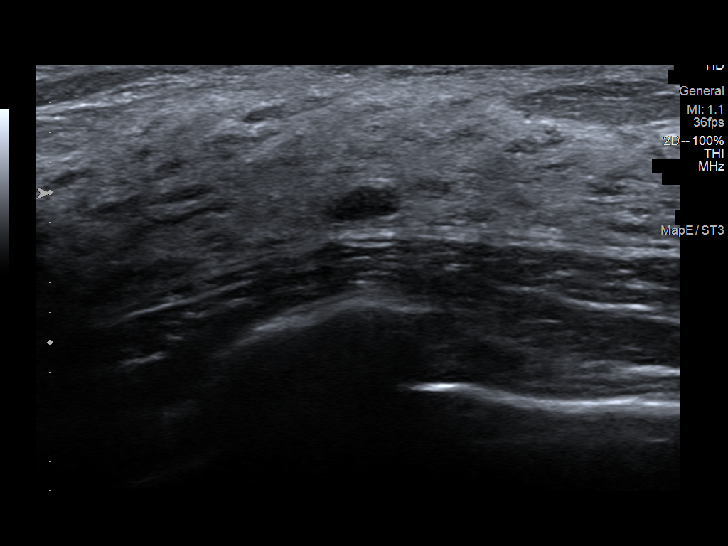
[im 15/18]
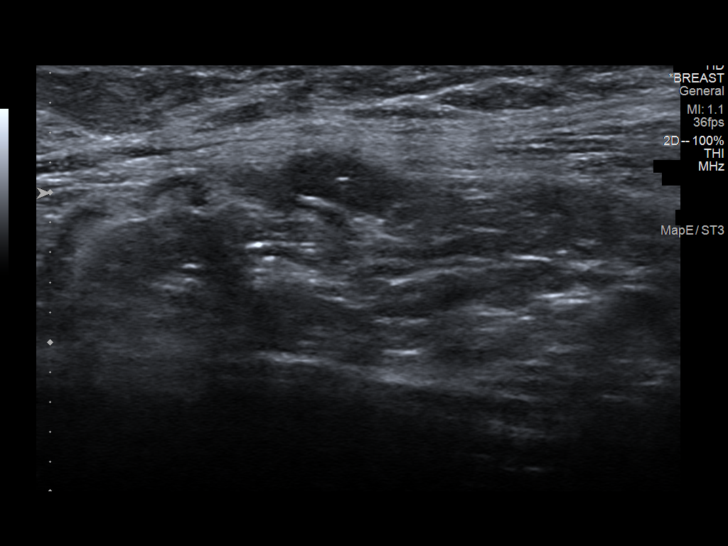
[im 16/18]
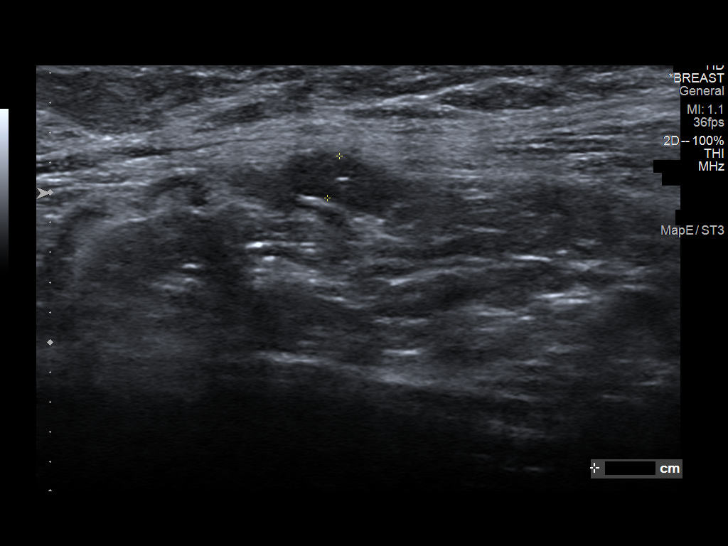
[im 18/18]
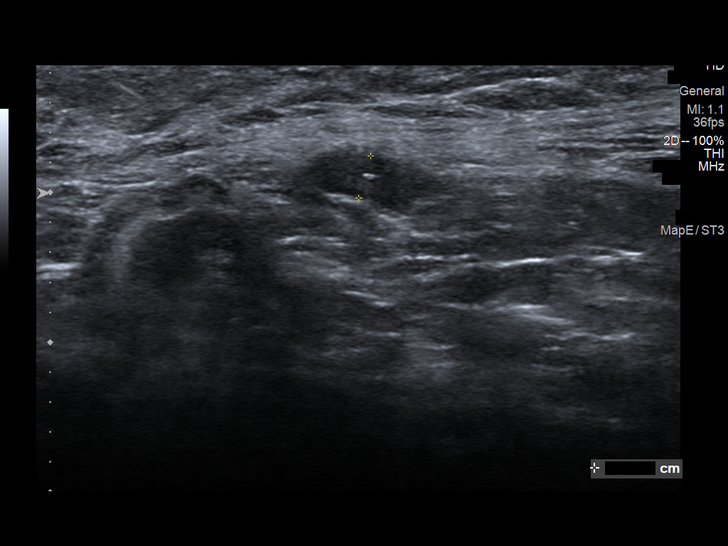

[12 of 18 positions shown; findings below may reference images not displayed]

FINDINGS: Targeted ultrasound is performed, showing interval decrease in size
of an irregular lobulated mass at the 12 o'clock position 2 cm from
the nipple. An echogenic focus is consistent with a post biopsy
clip. It now measures 1.6 x 1.3 x 0.9 cm (previously 3.0 x 2.7 x
cm. An adjacent satellite lesion with previous clip placement
measures 4 x 3 x 2 mm (previously 5 x 5 x 2 mm). A third hypoechoic
mass at the 10 o'clock position 5 cm from the nipple measures 4 x 4
x 2 mm (previously 5 x 5 x 3 mm). Please note, this lesion has not
been biopsied or localized with a clip.

Evaluation of the left axilla demonstrates the previously biopsied
lymph node with associated post biopsy clip, measuring 3 mm in
cortical thickness (previously 5 mm).
IMPRESSION: 1. Interval decrease in size of the previously biopsied masses and
lymph node consistent with response to chemotherapy.
2. Stable appearance of a hypoechoic mass at the 10 o'clock position
5 cm from the nipple incidentally identified at the time of the
patient's ultrasound-guided biopsy. Of note, this mass has not been
biopsied or localized.

RECOMMENDATION:
Per clinical treatment plan.

I have discussed the findings and recommendations with the patient.
If applicable, a reminder letter will be sent to the patient
regarding the next appointment.

BI-RADS CATEGORY  6: Known biopsy-proven malignancy.

ADDENDUM:
This is an error to the report originally dictated on [DATE].
Within the FINDINGS section, please note all evaluation and findings
were within the RIGHT breast and RIGHT axilla.

*** End of Addendum ***
FINDINGS: Targeted ultrasound is performed, showing interval decrease in size
of an irregular lobulated mass at the 12 o'clock position 2 cm from
the nipple. An echogenic focus is consistent with a post biopsy
clip. It now measures 1.6 x 1.3 x 0.9 cm (previously 3.0 x 2.7 x
cm. An adjacent satellite lesion with previous clip placement
measures 4 x 3 x 2 mm (previously 5 x 5 x 2 mm). A third hypoechoic
mass at the 10 o'clock position 5 cm from the nipple measures 4 x 4
x 2 mm (previously 5 x 5 x 3 mm). Please note, this lesion has not
been biopsied or localized with a clip.

Evaluation of the left axilla demonstrates the previously biopsied
lymph node with associated post biopsy clip, measuring 3 mm in
cortical thickness (previously 5 mm).
IMPRESSION: 1. Interval decrease in size of the previously biopsied masses and
lymph node consistent with response to chemotherapy.
2. Stable appearance of a hypoechoic mass at the 10 o'clock position
5 cm from the nipple incidentally identified at the time of the
patient's ultrasound-guided biopsy. Of note, this mass has not been
biopsied or localized.

RECOMMENDATION:
Per clinical treatment plan.

I have discussed the findings and recommendations with the patient.
If applicable, a reminder letter will be sent to the patient
regarding the next appointment.

BI-RADS CATEGORY  6: Known biopsy-proven malignancy.

## 2021-08-11 MED ORDER — PEGFILGRASTIM-BMEZ 6 MG/0.6ML ~~LOC~~ SOSY
6.0000 mg | PREFILLED_SYRINGE | Freq: Once | SUBCUTANEOUS | Status: AC
  Administered 2021-08-11: 6 mg via SUBCUTANEOUS

## 2021-08-11 NOTE — Progress Notes (Signed)
Pt stated she had the flu a week ago and that she feels completely fine and does not need fluids. Per Porshe LPN, there was no indication she would need it as well.

## 2021-08-12 ENCOUNTER — Encounter: Payer: Self-pay | Admitting: *Deleted

## 2021-08-12 ENCOUNTER — Encounter: Payer: Self-pay | Admitting: Oncology

## 2021-08-12 NOTE — Progress Notes (Addendum)
[facility]  Telephone:(336) [phone] Fax:(336) [phone]    ID: Ebony Lamb DOB: 10-02-1975  MR#: [id number]  EPP#:[id number]  Patient Care Team: Antony Contras, MD as PCP - General (Family Medicine) Coralie Keens, MD as Consulting Physician (General Surgery) Lanaysia Fritchman, Virgie Dad, MD as Consulting Physician (Oncology) Eppie Gibson, MD as Attending Physician (Radiation Oncology) Rockwell Germany, RN as Oncology Nurse Navigator Mauro Kaufmann, RN as Oncology Nurse Navigator Rozetta Nunnery, MD as Consulting Physician (Otolaryngology) Sanjuana Kava, MD as Referring Physician (Obstetrics and Gynecology) [name], MD as Consulting Physician (Plastic Surgery) Chauncey Cruel, MD OTHER MD:  I connected with Ebony Lamb on 08/13/21 at  9:00 AM EST by video enabled telemedicine visit and verified that I am speaking with the correct person using two identifiers.   I discussed the limitations, risks, security and privacy concerns of performing an evaluation and management service by telemedicine and the availability of in-person appointments. I also discussed with the patient that there may be a patient responsible charge related to this service. The patient expressed understanding and agreed to proceed.   Other persons participating in the visit and their role in the encounter: None  Patient's location: Home Provider's location: [facility]   I provided 20 minutes of face-to-face video visit time during this encounter, and > 50% was spent counseling as documented under my assessment & plan.   CHIEF COMPLAINT: Weakly estrogen receptor positive breast cancer  CURRENT TREATMENT: Neoadjuvant chemotherapy   INTERVAL HISTORY: Elzabeth was contacted today for follow up of her weakly estrogen receptor positive breast cancer.    She started neoadjuvant chemotherapy 06/25/2021 with doxorubicin and cyclophosphamide.  She received cycle 4 on 08/10/2021.  She has  tolerated these treatments generally well overall.  She underwent right breast ultrasound on 08/11/2021 showing: interval decrease in size of previously biopsied masses and lymph node; stable appearance of a hypoechoic mass at 10 o'clock (not previously biopsied).  These results suggest she will benefit from intensified treatment for the remaining of her cycle, namely the addition of carboplatin to paclitaxel weekly   REVIEW OF SYSTEMS: Poonam did well with her final cycle of cyclophosphamide and doxorubicin.  She finally worked out how to take all the supportive medicines so this was a bit easier on her.  On the other hand everyone at home had the flu she says and she herself may have had it.  She still has a little bit of a cough, which is minimally productive of clear phlegm.  She does not have a fever.  She does not have pleurisy.  She is planning a trip to [place] for Christmas and that is discussed below in terms of scheduling of her next chemotherapy treatments   [place] x3; infection 01/2021   HISTORY OF CURRENT ILLNESS: From the original intake note:  Ebony Lamb (pronounced "REECE") has a history of previous right breast biopsy (8 o'clock) in 07/2019 that was negative.  More recently she presented with a palpable right breast mass at 12 o'clock. She underwent right diagnostic mammography with tomography and right breast ultrasonography at [facility] on 06/01/2021 showing: breast density category D; palpable 3 cm irregular mass in right breast at 12 o'clock; possible 5 mm satellite lesion located 2.5 cm from dominant mass; abnormal right axillary lymph node.  Accordingly on 06/04/2021 she proceeded to biopsy of the right breast area in question. The pathology from this procedure ([id number]) showed:  invasive ductal carcinoma, grade 3. Prognostic indicators significant for: estrogen receptor, 15% positive and progesterone receptor, 15% positive, both with  weak-moderate staining intensity. Proliferation marker Ki67 at 70%. HER2 equivocal by immunohistochemistry (2+), but negative by fluorescent in situ hybridization with a signals ratio 1.47 and number per cell 2.50.  Biopsy of the questionable lymph node was negative and concordant   Cancer Staging  Malignant neoplasm of upper-outer quadrant of right breast in female, estrogen receptor positive ([place]) Staging form: Breast, AJCC 8th Edition - Clinical stage from 06/10/2021: Stage IIA (cT2, cN0, cM0, G3, ER+, PR+, HER2-) - Signed by Chauncey Cruel, MD on 06/10/2021 Stage prefix: Initial diagnosis Histologic grading system: 3 grade system Laterality: Right Staged by: Pathologist and managing physician Stage used in treatment planning: Yes National guidelines used in treatment planning: Yes Type of national guideline used in treatment planning: NCCN  The patient's subsequent history is as detailed below.   PAST MEDICAL HISTORY: Past Medical History:  Diagnosis Date   Abnormal Pap smear 09/13/2000   Allergy 02/2020   Seasonal   Cancer (Why)    Depression    history of depression- denies currently   H/O varicella    Hypertension 09/14/2011   post partum- meds x 1 mo   No pertinent past medical history    Preterm labor     PAST SURGICAL HISTORY: Past Surgical History:  Procedure Laterality Date   BREAST BIOPSY Right 07/27/2019   CESAREAN SECTION  11/14/2011   Procedure: CESAREAN SECTION;  Surgeon: Eli Hose, MD;  Location: [facility];  Service: Gynecology;  Laterality: N/A;  Primary cesarean section with delivery of baby boy at [phone]. Apgars 9/9.   CESAREAN SECTION N/A 03/22/2014   Procedure: Repeat CESAREAN SECTION;  Surgeon: Alwyn Pea, MD;  Location: [facility];  Service: Obstetrics;  Laterality: N/A;   COSMETIC SURGERY  11/22/2017   Abdominoplasty   FOOT SURGERY     FRACTURE SURGERY  08/1999   Repair of 5th metatarsal in left foot   HERNIA REPAIR  11/22/2017    PORTACATH PLACEMENT N/A 06/24/2021   Procedure: INSERTION PORT-A-CATH;  Surgeon: Coralie Keens, MD;  Location: [facility];  Service: General;  Laterality: N/A;   TUBAL LIGATION  11/22/2017   Tubes were completely removed   WISDOM TOOTH EXTRACTION      FAMILY HISTORY: Family History  Problem Relation Age of Onset   Breast cancer Mother 47   Osteopenia Mother    Arthritis Mother    Other Mother        BRCA2 gene mutation   Hypertension Father    Kidney disease Father    Depression Father    Hyperlipidemia Father    Melanoma Father 15   Hypertension Brother    Hyperlipidemia Brother    Breast cancer Maternal Grandmother        dx. late 30s/40s   Heart disease Maternal Grandfather    Other Maternal Grandfather        BRCA2 gene mutation   Ovarian cancer Paternal Grandmother        dx. 20s   Stroke Paternal Grandfather    Birth defects Son    Anesthesia problems Neg Hx    Diabetes Neg Hx    Her parents are both living, her father age 27 and her mother age 38 as of 05/2021. Renatha has one brother (and no sisters). She reports breast cancer in her mother at age 61 and her maternal grandmother in her late  52's, melanoma in her father at age 77, and ovarian cancer in her paternal grandmother in her 82's.   GYNECOLOGIC HISTORY:  No LMP recorded. Menarche: 45 years old Age at first live birth: 45 years old [place] P 2 LMP 06/06/2021 Contraceptive: used from 1996-2003 and 2006-2012 HRT n/a  Hysterectomy? no BSO? 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 Husband [name] work for [employer] in [employer]. She lives at home with [name] and their two children-- Tiffany Kocher, age 49, and Cori Razor, age 43. 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    ADVANCED DIRECTIVES: in place   HEALTH MAINTENANCE: Social History   Tobacco Use   Smoking status: Never   Smokeless tobacco: Never  Vaping Use   Vaping  Use: Never used  Substance Use Topics   Alcohol use: Yes    Alcohol/week: 7.0 standard drinks    Types: 7 Standard drinks or equivalent per week    Comment: I stopped drinking alcohol after finding the lump in my brea   Drug use: No     Colonoscopy: never done (age)  PAP: 06/2020  Bone density: never done (age)   No Known Allergies  Current Outpatient Medications  Medication Sig Dispense Refill   albuterol (VENTOLIN HFA) 108 (90 Base) MCG/ACT inhaler Inhale 2 puffs into the lungs every 6 (six) hours as needed for wheezing or shortness of breath. 8 g 2   dexamethasone (DECADRON) 1 MG tablet Take 1 or 2 tablets twice daily as instructed (together with 4 mg dexamethasone tablet) 20 tablet 1   dexamethasone (DECADRON) 4 MG tablet Take 1 tablet (4 mg total) by mouth 2 (two) times daily. Take daily for 3 days after chemo. Take with food. 30 tablet 1   doxycycline (VIBRA-TABS) 100 MG tablet Take 1 tablet (100 mg total) by mouth 2 (two) times daily. 10 tablet 0   doxycycline (VIBRA-TABS) 100 MG tablet Take 1 tablet (100 mg total) by mouth 2 (two) times daily. 14 tablet 0   lidocaine-prilocaine (EMLA) cream Apply to affected area once 30 g 3   loratadine (CLARITIN) 10 MG tablet Take 1 tablet (10 mg total) by mouth daily. Take day post chemo and then daily for 10 days with each treatment 30 tablet 3   LORazepam (ATIVAN) 0.5 MG tablet Take 1 tablet (0.5 mg total) by mouth at bedtime as needed (Nausea or vomiting). 30 tablet 0   ondansetron (ZOFRAN) 8 MG tablet Take 1 tablet (8 mg total) by mouth every 8 (eight) hours as needed for nausea or vomiting. May take starting day 3 of chemotherapy 20 tablet 0   oseltamivir (TAMIFLU) 75 MG capsule Take 1 capsule (75 mg total) by mouth 2 (two) times daily. 10 capsule 0   pramoxine-hydrocortisone cream Apply topically 3 (three) times daily. 57 g 0   prochlorperazine (COMPAZINE) 10 MG tablet Take 1 tablet (10 mg total) by mouth every 6 (six) hours as needed  (Nausea or vomiting). 30 tablet 1   traMADol (ULTRAM) 50 MG tablet Take 1-2 tablets (50-100 mg total) by mouth every 6 (six) hours as needed. 20 tablet 0   No current facility-administered medications for this visit.   Facility-Administered Medications Ordered in Other Visits  Medication Dose Route Frequency Provider Last Rate Last Admin   influenza vac split quadrivalent PF (FLUARIX) 0.5 ML injection            influenza vac split  quadrivalent PF (FLUARIX) injection 0.5 mL  0.5 mL Intramuscular Once Raigan Baria, Virgie Dad, MD        OBJECTIVE: White woman who appears well  There were no vitals filed for this visit.     There is no height or weight on file to calculate BMI.   Wt Readings from Last 3 Encounters:  07/23/21 121 lb 12.8 oz (55.2 kg)  07/09/21 121 lb 4.8 oz (55 kg)  06/30/21 126 lb 3.2 oz (57.2 kg)     ECOG FS:1 - Symptomatic but completely ambulatory  Telemedicine visit 08/13/2021; mild cough noted occasionally during televisit   LAB RESULTS:  CMP     Component Value Date/Time   NA 141 08/10/2021 0803   K 3.8 08/10/2021 0803   CL 109 08/10/2021 0803   CO2 23 08/10/2021 0803   GLUCOSE 103 (H) 08/10/2021 0803   BUN 11 08/10/2021 0803   CREATININE 0.67 08/10/2021 0803   CALCIUM 9.0 08/10/2021 0803   PROT 6.5 08/10/2021 0803   ALBUMIN 3.9 08/10/2021 0803   AST 20 08/10/2021 0803   ALT 19 08/10/2021 0803   ALKPHOS 92 08/10/2021 0803   BILITOT 0.4 08/10/2021 0803   GFRNONAA >60 08/10/2021 0803   GFRAA >90 11/26/2011 2008    No results found for: TOTALPROTELP, ALBUMINELP, A1GS, A2GS, BETS, BETA2SER, GAMS, MSPIKE, SPEI  Lab Results  Component Value Date   WBC 7.9 08/10/2021   NEUTROABS 6.5 08/10/2021   HGB 10.8 (L) 08/10/2021   HCT 32.8 (L) 08/10/2021   MCV 92.4 08/10/2021   PLT 227 08/10/2021    No results found for: LABCA2  No components found for: [id number]  No results for input(s): INR in the last 168 hours.  No results found for: LABCA2  No  results found for: YQM578  No results found for: ION629  No results found for: BMW413  No results found for: CA2729  No components found for: HGQUANT  No results found for: CEA1 / No results found for: CEA1   No results found for: AFPTUMOR  No results found for: CHROMOGRNA  No results found for: KPAFRELGTCHN, LAMBDASER, KAPLAMBRATIO (kappa/lambda light chains)  No results found for: HGBA, HGBA2QUANT, HGBFQUANT, HGBSQUAN (Hemoglobinopathy evaluation)   Lab Results  Component Value Date   LDH 223 11/26/2011    No results found for: IRON, TIBC, IRONPCTSAT (Iron and TIBC)  No results found for: FERRITIN  Urinalysis    Component Value Date/Time   COLORURINE YELLOW 11/26/2011 2000   APPEARANCEUR CLEAR 11/26/2011 2000   LABSPEC <1.005 (L) 11/26/2011 2000   PHURINE 6.5 11/26/2011 2000   GLUCOSEU NEGATIVE 11/26/2011 2000   HGBUR SMALL (A) 11/26/2011 2000   BILIRUBINUR NEGATIVE 11/26/2011 2000   KETONESUR NEGATIVE 11/26/2011 2000   PROTEINUR NEGATIVE 11/26/2011 2000   UROBILINOGEN 0.2 11/26/2011 2000   NITRITE NEGATIVE 11/26/2011 2000   LEUKOCYTESUR MODERATE (A) 11/26/2011 2000    STUDIES: US BREAST LTD UNI RIGHT INC AXILLA  Addendum Date: 08/12/2021   ADDENDUM REPORT: 08/12/2021 10:03 ADDENDUM: This is an error to the report originally dictated on 08/11/2021. Within the FINDINGS section, please note all evaluation and findings were within the RIGHT breast and RIGHT axilla. Electronically Signed   By: Kristopher Oppenheim M.D.   On: 08/12/2021 10:03   Result Date: 08/12/2021 CLINICAL DATA:  45 year old female with recently diagnosed right breast malignancy presents for ultrasound evaluation after neoadjuvant chemotherapy. EXAM: ULTRASOUND OF THE RIGHT BREAST COMPARISON:  Previous exam(s). FINDINGS: Targeted ultrasound is performed, showing interval decrease  in size of an irregular lobulated mass at the 12 o'clock position 2 cm from the nipple. An echogenic focus is consistent  with a post biopsy clip. It now measures 1.6 x 1.3 x 0.9 cm (previously 3.0 x 2.7 x 1.6 cm. An adjacent satellite lesion with previous clip placement measures 4 x 3 x 2 mm (previously 5 x 5 x 2 mm). A third hypoechoic mass at the 10 o'clock position 5 cm from the nipple measures 4 x 4 x 2 mm (previously 5 x 5 x 3 mm). Please note, this lesion has not been biopsied or localized with a clip. Evaluation of the left axilla demonstrates the previously biopsied lymph node with associated post biopsy clip, measuring 3 mm in cortical thickness (previously 5 mm). IMPRESSION: 1. Interval decrease in size of the previously biopsied masses and lymph node consistent with response to chemotherapy. 2. Stable appearance of a hypoechoic mass at the 10 o'clock position 5 cm from the nipple incidentally identified at the time of the patient's ultrasound-guided biopsy. Of note, this mass has not been biopsied or localized. RECOMMENDATION: Per clinical treatment plan. I have discussed the findings and recommendations with the patient. If applicable, a reminder letter will be sent to the patient regarding the next appointment. BI-RADS CATEGORY  6: Known biopsy-proven malignancy. Electronically Signed: By: Kristopher Oppenheim M.D. On: 08/11/2021 16:29    ELIGIBLE FOR AVAILABLE RESEARCH PROTOCOL: no  ASSESSMENT: 45 y.o. 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), with an MIB-1 of 70%  (A) chest CT with contrast 06/22/2021 shows right breast mass, no other lesions of concern; pulmonary nodules all less than 5 mm noted, likely benign  (B) bone scan 06/22/2021 negative for metastases; consistent with sacroiliitis  (1) genetics testing: (A) [facility] BRCAplus dated 06/16/2021 finds no deleterious mutations in ATM, BRCA1, BRCA2, CDH1, CHEK2, PALB2,  PTEN, and TP53 (B) The [facility]-Expanded gene panel obtained 06/19/2021 showed no deleterious mutations in AIP, ALK, APC, ATM, AXIN2, BAP1, BARD1, BLM, BMPR1A, BRCA1, BRCA2, BRIP1, CDC73, CDH1, CDK4, CDKN1B, CDKN2A, CHEK2, CTNNA1, DICER1, FANCC, FH, FLCN, GALNT12, KIF1B, LZTR1, MAX, MEN1, MET, MLH1, MSH2, MSH3, MSH6, MUTYH, NBN, NF1, NF2, NTHL1, PALB2, PHOX2B, PMS2, POT1, PRKAR1A, PTCH1, PTEN, RAD51C, RAD51D, RB1, RECQL, RET, SDHA, SDHAF2, SDHB, SDHC, SDHD, SMAD4, SMARCA4, SMARCB1, SMARCE1, STK11, SUFU, TMEM127, TP53, TSC1, TSC2, VHL and XRCC2 (sequencing and deletion/duplication); EGFR, EGLN1, HOXB13, KIT, MITF, PDGFRA, POLD1, and POLE (sequencing only); EPCAM and GREM1 (deletion/duplication only). (C)  a variant of uncertain significance was identified in the PTCH1 gene  (2) neoadjuvant chemotherapy consisting of doxorubicin and cyclophosphamide in dose dense fashion x4 started 0/13/2022, completed 08/10/2021, to be followed by weekly carboplatin and paclitaxel x12 starting 08/24/2021  (3) definitive surgery to follow  (4) adjuvant radiation as appropriate  (5) antiestrogens   PLAN: Camren has completed the more intense part of her chemotherapy.  She tolerated it moderately well.  We obtained a repeat ultrasound of the breast to assess for initial response.  This shows a 50% regression which in general is encouraging.  On the other hand in many cases we see entire resolution of the tumor after this very intensive portion of the treatment.  I am concerned that we need to intensify her chemotherapy because her cancer is functionally triple negative.  The estrogen and progesterone receptor are inadequate.  Frequently when we  repeat testing in these tumors after the final surgery we find that they are frankly triple negative.  Accordingly we are adding carboplatin to the weekly paclitaxel treatment.  I have discussed that with her at in detail today.  She understand the possible toxicities side  effects and complications of this addition.  She also understands the possible benefits.  We would ideally want to see a complete pathologic response at the time of surgery.  She is going to be in [place] for the holidays.  She will be treated December 12 and December 19 and then her next treatment will be December 30.  She knows to call for any other issues that may develop before the next visit.  Total encounter time 20 minutes.*  Total encounter time 20 minutes.Sarajane Jews C. Chistian Kasler, MD 08/13/21 11:56 AM Medical Oncology and Hematology [facility], [place] [address] Tel. [phone]    Fax. [phone]   I, Wilburn Mylar, am acting as scribe for Dr. Virgie Dad. Averee Harb.  I, Lurline Del MD, have reviewed the above documentation for accuracy and completeness, and I agree with the above.   *Total Encounter Time as defined by the Centers for Medicare and Medicaid Services includes, in addition to the face-to-face time of a patient visit (documented in the note above) non-face-to-face time: obtaining and reviewing outside history, ordering and reviewing medications, tests or procedures, care coordination (communications with other health care professionals or caregivers) and documentation in the medical record.

## 2021-08-13 ENCOUNTER — Telehealth (HOSPITAL_BASED_OUTPATIENT_CLINIC_OR_DEPARTMENT_OTHER): Payer: No Typology Code available for payment source | Admitting: Oncology

## 2021-08-13 ENCOUNTER — Other Ambulatory Visit: Payer: Self-pay | Admitting: Pharmacist

## 2021-08-13 ENCOUNTER — Other Ambulatory Visit: Payer: Self-pay | Admitting: Oncology

## 2021-08-13 DIAGNOSIS — C50411 Malignant neoplasm of upper-outer quadrant of right female breast: Secondary | ICD-10-CM | POA: Diagnosis not present

## 2021-08-13 DIAGNOSIS — Z170 Estrogen receptor positive status [ER+]: Secondary | ICD-10-CM | POA: Diagnosis not present

## 2021-08-13 NOTE — Progress Notes (Signed)
[place]  Telephone:([phone]) [phone] Fax:(336) [phone]    ID: Ebony Lamb DOB: [birth year]  MR#: [id number]  KKX#:[id number]  Patient Care Team: Antony Contras, MD as PCP - General (Family Medicine) Coralie Keens, MD as Consulting Physician (General Surgery) Emme Rosenau, Virgie Dad, MD as Consulting Physician (Oncology) Eppie Gibson, MD as Attending Physician (Radiation Oncology) Rockwell Germany, RN as Oncology Nurse Navigator Mauro Kaufmann, RN as Oncology Nurse Navigator Rozetta Nunnery, MD as Consulting Physician (Otolaryngology) Sanjuana Kava, MD as Referring Physician (Obstetrics and Gynecology) [name], MD as Consulting Physician (Plastic Surgery) Chauncey Cruel, MD OTHER MD:  I connected with Ebony Lamb on 08/13/21 at  by video enabled telemedicine visit and verified that I am speaking with the correct person using two identifiers.   I discussed the limitations, risks, security and privacy concerns of performing an evaluation and management service by telemedicine and the availability of in-person appointments. I also discussed with the patient that there may be a patient responsible charge related to this service. The patient expressed understanding and agreed to proceed.   Other persons participating in the visit and their role in the encounter: None  Patient's location: Home Provider's location: [facility]   I provided 20 minutes of face-to-face video visit time during this encounter, and > 50% was spent counseling as documented under my assessment & plan.   CHIEF COMPLAINT: Weakly estrogen receptor positive breast cancer  CURRENT TREATMENT: Neoadjuvant chemotherapy   INTERVAL HISTORY: Ebony Lamb was contacted today for follow up of her weakly estrogen receptor positive breast cancer.    She started neoadjuvant chemotherapy 06/25/2021 with doxorubicin and cyclophosphamide.  She received cycle 4 on 08/10/2021.  She has tolerated  these treatments generally well overall.  She underwent right breast ultrasound on 08/11/2021 showing: interval decrease in size of previously biopsied masses and lymph node; stable appearance of a hypoechoic mass at 10 o'clock (not previously biopsied).  These results suggest she will benefit from intensified treatment for the remaining of her cycle, namely the addition of carboplatin to paclitaxel weekly   REVIEW OF SYSTEMS: Ebony Lamb did well with her final cycle of cyclophosphamide and doxorubicin.  She finally worked out how to take all the supportive medicines so this was a bit easier on her.  On the other hand everyone at home had the flu she says and she herself may have had it.  She still has a little bit of a cough, which is minimally productive of clear phlegm.  She does not have a fever.  She does not have pleurisy.  She is planning a trip to [place] for Christmas and that is discussed below in terms of scheduling of her next chemotherapy treatments   [place] x3; infection 01/2021   HISTORY OF CURRENT ILLNESS: From the original intake note:  Ebony Lamb (pronounced "REECE") has a history of previous right breast biopsy (8 o'clock) in 07/2019 that was negative.  More recently she presented with a palpable right breast mass at 12 o'clock. She underwent right diagnostic mammography with tomography and right breast ultrasonography at [facility] on 06/01/2021 showing: breast density category D; palpable 3 cm irregular mass in right breast at 12 o'clock; possible 5 mm satellite lesion located 2.5 cm from dominant mass; abnormal right axillary lymph node.  Accordingly on 06/04/2021 she proceeded to biopsy of the right breast area in question. The pathology from this procedure ([id number]) showed: invasive ductal carcinoma,  grade 3. Prognostic indicators significant for: estrogen receptor, 15% positive and progesterone receptor, 15% positive, both with weak-moderate  staining intensity. Proliferation marker Ki67 at 70%. HER2 equivocal by immunohistochemistry (2+), but negative by fluorescent in situ hybridization with a signals ratio 1.47 and number per cell 2.50.  Biopsy of the questionable lymph node was negative and concordant   Cancer Staging  Malignant neoplasm of upper-outer quadrant of right breast in female, estrogen receptor positive ([place]) Staging form: Breast, AJCC 8th Edition - Clinical stage from 06/10/2021: Stage IIA (cT2, cN0, cM0, G3, ER+, PR+, HER2-) - Signed by Chauncey Cruel, MD on 06/10/2021 Stage prefix: Initial diagnosis Histologic grading system: 3 grade system Laterality: Right Staged by: Pathologist and managing physician Stage used in treatment planning: Yes National guidelines used in treatment planning: Yes Type of national guideline used in treatment planning: NCCN  The patient's subsequent history is as detailed below.   PAST MEDICAL HISTORY: Past Medical History:  Diagnosis Date   Abnormal Pap smear 09/13/2000   Allergy 02/2020   Seasonal   Cancer ([place])    Depression    history of depression- denies currently   H/O varicella    Hypertension 09/14/2011   post partum- meds x 1 mo   No pertinent past medical history    Preterm labor     PAST SURGICAL HISTORY: Past Surgical History:  Procedure Laterality Date   BREAST BIOPSY Right 07/27/2019   CESAREAN SECTION  11/14/2011   Procedure: CESAREAN SECTION;  Surgeon: Eli Hose, MD;  Location: [facility];  Service: Gynecology;  Laterality: N/A;  Primary cesarean section with delivery of baby boy at [phone]. Apgars 9/9.   CESAREAN SECTION N/A 03/22/2014   Procedure: Repeat CESAREAN SECTION;  Surgeon: Alwyn Pea, MD;  Location: [facility];  Service: Obstetrics;  Laterality: N/A;   COSMETIC SURGERY  11/22/2017   Abdominoplasty   FOOT SURGERY     FRACTURE SURGERY  08/1999   Repair of 5th metatarsal in left foot   HERNIA REPAIR  11/22/2017   PORTACATH PLACEMENT  N/A 06/24/2021   Procedure: INSERTION PORT-A-CATH;  Surgeon: Coralie Keens, MD;  Location: [facility];  Service: General;  Laterality: N/A;   TUBAL LIGATION  11/22/2017   Tubes were completely removed   WISDOM TOOTH EXTRACTION      FAMILY HISTORY: Family History  Problem Relation Age of Onset   Breast cancer Mother 64   Osteopenia Mother    Arthritis Mother    Other Mother        BRCA2 gene mutation   Hypertension Father    Kidney disease Father    Depression Father    Hyperlipidemia Father    Melanoma Father 74   Hypertension Brother    Hyperlipidemia Brother    Breast cancer Maternal Grandmother        dx. late 30s/40s   Heart disease Maternal Grandfather    Other Maternal Grandfather        BRCA2 gene mutation   Ovarian cancer Paternal Grandmother        dx. 30s   Stroke Paternal Grandfather    Birth defects Son    Anesthesia problems Neg Hx    Diabetes Neg Hx    Her parents are both living, her father age 79 and her mother age 23 as of 05/2021. Keari has one brother (and no sisters). She reports breast cancer in her mother at age 44 and her maternal grandmother in her late 90's, melanoma in  her father at age 66, and ovarian cancer in her paternal grandmother in her 42's.   GYNECOLOGIC HISTORY:  No LMP recorded. Menarche: 45 years old Age at first live birth: 44 years old [place] P 2 LMP 06/06/2021 Contraceptive: used from 1996-2003 and 2006-2012 HRT n/a  Hysterectomy? no BSO? 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 Husband [name] work for [employer] in [employer]. She lives at home with [name] and their two children-- Tiffany Kocher, age 23, and Cori Razor, age 23. 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    ADVANCED DIRECTIVES: in place   HEALTH MAINTENANCE: Social History   Tobacco Use   Smoking status: Never   Smokeless tobacco: Never  Vaping Use   Vaping Use: Never used   Substance Use Topics   Alcohol use: Yes    Alcohol/week: 7.0 standard drinks    Types: 7 Standard drinks or equivalent per week    Comment: I stopped drinking alcohol after finding the lump in my brea   Drug use: No     Colonoscopy: never done (age)  PAP: 06/2020  Bone density: never done (age)   No Known Allergies  Current Outpatient Medications  Medication Sig Dispense Refill   albuterol (VENTOLIN HFA) 108 (90 Base) MCG/ACT inhaler Inhale 2 puffs into the lungs every 6 (six) hours as needed for wheezing or shortness of breath. 8 g 2   dexamethasone (DECADRON) 1 MG tablet Take 1 or 2 tablets twice daily as instructed (together with 4 mg dexamethasone tablet) 20 tablet 1   dexamethasone (DECADRON) 4 MG tablet Take 1 tablet (4 mg total) by mouth 2 (two) times daily. Take daily for 3 days after chemo. Take with food. 30 tablet 1   doxycycline (VIBRA-TABS) 100 MG tablet Take 1 tablet (100 mg total) by mouth 2 (two) times daily. 10 tablet 0   doxycycline (VIBRA-TABS) 100 MG tablet Take 1 tablet (100 mg total) by mouth 2 (two) times daily. 14 tablet 0   lidocaine-prilocaine (EMLA) cream Apply to affected area once 30 g 3   loratadine (CLARITIN) 10 MG tablet Take 1 tablet (10 mg total) by mouth daily. Take day post chemo and then daily for 10 days with each treatment 30 tablet 3   LORazepam (ATIVAN) 0.5 MG tablet Take 1 tablet (0.5 mg total) by mouth at bedtime as needed (Nausea or vomiting). 30 tablet 0   ondansetron (ZOFRAN) 8 MG tablet Take 1 tablet (8 mg total) by mouth every 8 (eight) hours as needed for nausea or vomiting. May take starting day 3 of chemotherapy 20 tablet 0   oseltamivir (TAMIFLU) 75 MG capsule Take 1 capsule (75 mg total) by mouth 2 (two) times daily. 10 capsule 0   pramoxine-hydrocortisone cream Apply topically 3 (three) times daily. 57 g 0   prochlorperazine (COMPAZINE) 10 MG tablet Take 1 tablet (10 mg total) by mouth every 6 (six) hours as needed (Nausea or  vomiting). 30 tablet 1   traMADol (ULTRAM) 50 MG tablet Take 1-2 tablets (50-100 mg total) by mouth every 6 (six) hours as needed. 20 tablet 0   No current facility-administered medications for this visit.   Facility-Administered Medications Ordered in Other Visits  Medication Dose Route Frequency Provider Last Rate Last Admin   influenza vac split quadrivalent PF (FLUARIX) 0.5 ML injection            influenza vac split quadrivalent PF (FLUARIX)  injection 0.5 mL  0.5 mL Intramuscular Once Shaunette Gassner, Virgie Dad, MD        OBJECTIVE: White woman who appears well  There were no vitals filed for this visit.     There is no height or weight on file to calculate BMI.   Wt Readings from Last 3 Encounters:  07/23/21 121 lb 12.8 oz (55.2 kg)  07/09/21 121 lb 4.8 oz (55 kg)  06/30/21 126 lb 3.2 oz (57.2 kg)     ECOG FS:1 - Symptomatic but completely ambulatory  Telemedicine visit 08/13/2021; mild cough noted occasionally during televisit   LAB RESULTS:  CMP     Component Value Date/Time   NA 141 08/10/2021 0803   K 3.8 08/10/2021 0803   CL 109 08/10/2021 0803   CO2 23 08/10/2021 0803   GLUCOSE 103 (H) 08/10/2021 0803   BUN 11 08/10/2021 0803   CREATININE 0.67 08/10/2021 0803   CALCIUM 9.0 08/10/2021 0803   PROT 6.5 08/10/2021 0803   ALBUMIN 3.9 08/10/2021 0803   AST 20 08/10/2021 0803   ALT 19 08/10/2021 0803   ALKPHOS 92 08/10/2021 0803   BILITOT 0.4 08/10/2021 0803   GFRNONAA >60 08/10/2021 0803   GFRAA >90 11/26/2011 2008    No results found for: TOTALPROTELP, ALBUMINELP, A1GS, A2GS, BETS, BETA2SER, GAMS, MSPIKE, SPEI  Lab Results  Component Value Date   WBC 7.9 08/10/2021   NEUTROABS 6.5 08/10/2021   HGB 10.8 (L) 08/10/2021   HCT 32.8 (L) 08/10/2021   MCV 92.4 08/10/2021   PLT 227 08/10/2021    No results found for: LABCA2  No components found for: QVZDGL875  No results for input(s): INR in the last 168 hours.  No results found for: LABCA2  No results found  for: IEP329  No results found for: JJO841  No results found for: YSA630  No results found for: CA2729  No components found for: HGQUANT  No results found for: CEA1 / No results found for: CEA1   No results found for: AFPTUMOR  No results found for: CHROMOGRNA  No results found for: KPAFRELGTCHN, LAMBDASER, KAPLAMBRATIO (kappa/lambda light chains)  No results found for: HGBA, HGBA2QUANT, HGBFQUANT, HGBSQUAN (Hemoglobinopathy evaluation)   Lab Results  Component Value Date   LDH 223 11/26/2011    No results found for: IRON, TIBC, IRONPCTSAT (Iron and TIBC)  No results found for: FERRITIN  Urinalysis    Component Value Date/Time   COLORURINE YELLOW 11/26/2011 2000   APPEARANCEUR CLEAR 11/26/2011 2000   LABSPEC <1.005 (L) 11/26/2011 2000   PHURINE 6.5 11/26/2011 2000   GLUCOSEU NEGATIVE 11/26/2011 2000   HGBUR SMALL (A) 11/26/2011 2000   BILIRUBINUR NEGATIVE 11/26/2011 2000   KETONESUR NEGATIVE 11/26/2011 2000   PROTEINUR NEGATIVE 11/26/2011 2000   UROBILINOGEN 0.2 11/26/2011 2000   NITRITE NEGATIVE 11/26/2011 2000   LEUKOCYTESUR MODERATE (A) 11/26/2011 2000    STUDIES: US BREAST LTD UNI RIGHT INC AXILLA  Addendum Date: 08/12/2021   ADDENDUM REPORT: 08/12/2021 10:03 ADDENDUM: This is an error to the report originally dictated on 08/11/2021. Within the FINDINGS section, please note all evaluation and findings were within the RIGHT breast and RIGHT axilla. Electronically Signed   By: Kristopher Oppenheim M.D.   On: 08/12/2021 10:03   Result Date: 08/12/2021 CLINICAL DATA:  45 year old female with recently diagnosed right breast malignancy presents for ultrasound evaluation after neoadjuvant chemotherapy. EXAM: ULTRASOUND OF THE RIGHT BREAST COMPARISON:  Previous exam(s). FINDINGS: Targeted ultrasound is performed, showing interval decrease in size of  an irregular lobulated mass at the 12 o'clock position 2 cm from the nipple. An echogenic focus is consistent with a post  biopsy clip. It now measures 1.6 x 1.3 x 0.9 cm (previously 3.0 x 2.7 x 1.6 cm. An adjacent satellite lesion with previous clip placement measures 4 x 3 x 2 mm (previously 5 x 5 x 2 mm). A third hypoechoic mass at the 10 o'clock position 5 cm from the nipple measures 4 x 4 x 2 mm (previously 5 x 5 x 3 mm). Please note, this lesion has not been biopsied or localized with a clip. Evaluation of the left axilla demonstrates the previously biopsied lymph node with associated post biopsy clip, measuring 3 mm in cortical thickness (previously 5 mm). IMPRESSION: 1. Interval decrease in size of the previously biopsied masses and lymph node consistent with response to chemotherapy. 2. Stable appearance of a hypoechoic mass at the 10 o'clock position 5 cm from the nipple incidentally identified at the time of the patient's ultrasound-guided biopsy. Of note, this mass has not been biopsied or localized. RECOMMENDATION: Per clinical treatment plan. I have discussed the findings and recommendations with the patient. If applicable, a reminder letter will be sent to the patient regarding the next appointment. BI-RADS CATEGORY  6: Known biopsy-proven malignancy. Electronically Signed: By: Kristopher Oppenheim M.D. On: 08/11/2021 16:29    ELIGIBLE FOR AVAILABLE RESEARCH PROTOCOL: no  ASSESSMENT: 45 y.o.  woman status post right breast upper outer quadrant biopsy 06/04/2021 for a clinical mT2 N0 invasive ductal carcinoma, grade 3, estrogen and progesterone receptor weakly positive, HER2 not amplified, with an MIB-1 of 70%  (A) chest CT with contrast 06/22/2021 shows right breast mass, no other lesions of concern; pulmonary nodules all less than 5 mm noted, likely benign  (B) bone scan 06/22/2021 negative for metastases; consistent with sacroiliitis  (1) genetics testing: (A) [facility] BRCAplus dated 06/16/2021 finds no deleterious mutations in ATM, BRCA1, BRCA2, CDH1, CHEK2, PALB2, PTEN, and TP53 (B) The [facility]-Expanded  gene panel obtained 06/19/2021 showed no deleterious mutations in AIP, ALK, APC, ATM, AXIN2, BAP1, BARD1, BLM, BMPR1A, BRCA1, BRCA2, BRIP1, CDC73, CDH1, CDK4, CDKN1B, CDKN2A, CHEK2, CTNNA1, DICER1, FANCC, FH, FLCN, GALNT12, KIF1B, LZTR1, MAX, MEN1, MET, MLH1, MSH2, MSH3, MSH6, MUTYH, NBN, NF1, NF2, NTHL1, PALB2, PHOX2B, PMS2, POT1, PRKAR1A, PTCH1, PTEN, RAD51C, RAD51D, RB1, RECQL, RET, SDHA, SDHAF2, SDHB, SDHC, SDHD, SMAD4, SMARCA4, SMARCB1, SMARCE1, STK11, SUFU, TMEM127, TP53, TSC1, TSC2, VHL and XRCC2 (sequencing and deletion/duplication); EGFR, EGLN1, HOXB13, KIT, MITF, PDGFRA, POLD1, and POLE (sequencing only); EPCAM and GREM1 (deletion/duplication only). 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  Accordingly I have canceled that appointment.  Instead as she will be treated 08/10/2021.  She will see me that day.  We will correct the rest of her schedule at that time.  She developed significant symptoms certainly we could consider treatment of her flu but at this point she appears to be doing well (and  has not been definitively diagnosed with it).  Total encounter time 20 minutes.Sarajane Jews C. Ramzi Brathwaite, MD 08/13/21 11:53 AM Medical Oncology and Hematology [facility] [place], [place] [address] Tel. [phone]    Fax. [phone]   I, Wilburn Mylar, am acting as scribe for Dr.  Virgie Dad. Ahlaya Ende.  I, Lurline Del MD, have reviewed the above documentation for accuracy and completeness, and I agree with the above.   *Total Encounter Time as defined by the Centers for Medicare and Medicaid Services includes, in addition to the face-to-face time of a patient visit (documented in the note above) non-face-to-face time: obtaining and reviewing outside history, ordering and reviewing medications, tests or procedures, care coordination (communications with other health care professionals or caregivers) and documentation in the medical record.

## 2021-08-13 NOTE — Progress Notes (Signed)
[facility]       Telephone: [phone]?Fax: [phone]   Oncology Clinical Pharmacist Practitioner Progress Note  Ebony Lamb is a 45 y.o. female with a diagnosis of breast cancer currently on dose dense AC followed by weekly paclitaxel/carboplatin under the care of Dr. Jana Hakim.  Dr. Jana Hakim had a video visit today with Ebony Lamb and decided to add carboplatin to her treatment plan.  He states there have been no other changes to her treatment plan. He asked clinical pharmacy to review treatment plan for accuracy.  Adjusted scheduling times and offsets for carboplatin.  Forwarded plan to [employer] team to process authorization for carboplatin. Sent back treatment plan to Dr. Jana Hakim for review and signature if he approves.  Ebony Lamb will tentatively start weekly paclitaxel/carboplatin on 08/20/21.  Clinical pharmacy will continue to support Ebony Lamb and Dr. Jana Hakim as needed.  Raina Mina, RPH-CPP,  08/13/2021  4:09 PM

## 2021-08-14 ENCOUNTER — Inpatient Hospital Stay: Payer: No Typology Code available for payment source | Attending: Oncology

## 2021-08-14 ENCOUNTER — Inpatient Hospital Stay (HOSPITAL_BASED_OUTPATIENT_CLINIC_OR_DEPARTMENT_OTHER): Payer: No Typology Code available for payment source | Admitting: Adult Health

## 2021-08-14 ENCOUNTER — Inpatient Hospital Stay: Payer: No Typology Code available for payment source

## 2021-08-14 ENCOUNTER — Other Ambulatory Visit: Payer: Self-pay | Admitting: *Deleted

## 2021-08-14 ENCOUNTER — Ambulatory Visit (HOSPITAL_COMMUNITY)
Admission: RE | Admit: 2021-08-14 | Discharge: 2021-08-14 | Disposition: A | Discharge status: Home / Self Care | Payer: No Typology Code available for payment source | Source: Ambulatory Visit | Attending: Adult Health | Admitting: Adult Health

## 2021-08-14 ENCOUNTER — Encounter: Payer: Self-pay | Admitting: Adult Health

## 2021-08-14 ENCOUNTER — Other Ambulatory Visit: Payer: Self-pay

## 2021-08-14 ENCOUNTER — Telehealth: Payer: Self-pay | Admitting: *Deleted

## 2021-08-14 VITALS — BP 108/66 | HR 97 | Temp 100.8°F | Resp 18 | Ht 66.0 in | Wt 127.2 lb

## 2021-08-14 DIAGNOSIS — Z170 Estrogen receptor positive status [ER+]: Secondary | ICD-10-CM

## 2021-08-14 DIAGNOSIS — Z8041 Family history of malignant neoplasm of ovary: Secondary | ICD-10-CM | POA: Diagnosis not present

## 2021-08-14 DIAGNOSIS — M7918 Myalgia, other site: Secondary | ICD-10-CM | POA: Insufficient documentation

## 2021-08-14 DIAGNOSIS — Z5111 Encounter for antineoplastic chemotherapy: Secondary | ICD-10-CM | POA: Diagnosis present

## 2021-08-14 DIAGNOSIS — K5900 Constipation, unspecified: Secondary | ICD-10-CM | POA: Diagnosis not present

## 2021-08-14 DIAGNOSIS — Z95828 Presence of other vascular implants and grafts: Secondary | ICD-10-CM

## 2021-08-14 DIAGNOSIS — Z20822 Contact with and (suspected) exposure to covid-19: Secondary | ICD-10-CM | POA: Insufficient documentation

## 2021-08-14 DIAGNOSIS — N939 Abnormal uterine and vaginal bleeding, unspecified: Secondary | ICD-10-CM | POA: Insufficient documentation

## 2021-08-14 DIAGNOSIS — R509 Fever, unspecified: Secondary | ICD-10-CM | POA: Diagnosis present

## 2021-08-14 DIAGNOSIS — Z803 Family history of malignant neoplasm of breast: Secondary | ICD-10-CM | POA: Diagnosis not present

## 2021-08-14 DIAGNOSIS — R051 Acute cough: Secondary | ICD-10-CM

## 2021-08-14 DIAGNOSIS — Z853 Personal history of malignant neoplasm of breast: Secondary | ICD-10-CM | POA: Insufficient documentation

## 2021-08-14 DIAGNOSIS — C50411 Malignant neoplasm of upper-outer quadrant of right female breast: Secondary | ICD-10-CM | POA: Insufficient documentation

## 2021-08-14 DIAGNOSIS — R5383 Other fatigue: Secondary | ICD-10-CM | POA: Diagnosis not present

## 2021-08-14 DIAGNOSIS — Z8616 Personal history of COVID-19: Secondary | ICD-10-CM | POA: Insufficient documentation

## 2021-08-14 DIAGNOSIS — N9489 Other specified conditions associated with female genital organs and menstrual cycle: Secondary | ICD-10-CM | POA: Insufficient documentation

## 2021-08-14 DIAGNOSIS — K625 Hemorrhage of anus and rectum: Secondary | ICD-10-CM | POA: Diagnosis not present

## 2021-08-14 DIAGNOSIS — E860 Dehydration: Secondary | ICD-10-CM

## 2021-08-14 DIAGNOSIS — K644 Residual hemorrhoidal skin tags: Secondary | ICD-10-CM | POA: Diagnosis not present

## 2021-08-14 DIAGNOSIS — D6481 Anemia due to antineoplastic chemotherapy: Secondary | ICD-10-CM | POA: Insufficient documentation

## 2021-08-14 DIAGNOSIS — I10 Essential (primary) hypertension: Secondary | ICD-10-CM | POA: Insufficient documentation

## 2021-08-14 DIAGNOSIS — Z79899 Other long term (current) drug therapy: Secondary | ICD-10-CM | POA: Diagnosis not present

## 2021-08-14 LAB — CMP (CANCER CENTER ONLY)
ALT: 17 U/L (ref 0–44)
AST: 15 U/L (ref 15–41)
Albumin: 3.5 g/dL (ref 3.5–5.0)
Alkaline Phosphatase: 145 U/L — ABNORMAL HIGH (ref 38–126)
Anion gap: 9 (ref 5–15)
BUN: 11 mg/dL (ref 6–20)
CO2: 22 mmol/L (ref 22–32)
Calcium: 8.1 mg/dL — ABNORMAL LOW (ref 8.9–10.3)
Chloride: 105 mmol/L (ref 98–111)
Creatinine: 0.64 mg/dL (ref 0.44–1.00)
GFR, Estimated: >60 mL/min (ref >60)
Glucose, Bld: 103 mg/dL — ABNORMAL HIGH (ref 70–99)
Potassium: 3.6 mmol/L (ref 3.5–5.1)
Sodium: 136 mmol/L (ref 135–145)
Total Bilirubin: 0.3 mg/dL (ref 0.3–1.2)
Total Protein: 5.8 g/dL — ABNORMAL LOW (ref 6.5–8.1)

## 2021-08-14 LAB — URINALYSIS, COMPLETE (UACMP) WITH MICROSCOPIC
Bilirubin Urine: NEGATIVE
Glucose, UA: NEGATIVE mg/dL
Hgb urine dipstick: NEGATIVE
Ketones, ur: NEGATIVE mg/dL
Leukocytes,Ua: NEGATIVE
Nitrite: NEGATIVE
Protein, ur: NEGATIVE mg/dL
Specific Gravity, Urine: 1.015 (ref 1.005–1.030)
pH: 5 (ref 5.0–8.0)

## 2021-08-14 LAB — CBC WITH DIFFERENTIAL (CANCER CENTER ONLY)
Abs Immature Granulocytes: 0 10*3/uL (ref 0.00–0.07)
Band Neutrophils: 11 %
Basophils Absolute: 0 10*3/uL (ref 0.0–0.1)
Basophils Relative: 0 %
Eosinophils Absolute: 0 10*3/uL (ref 0.0–0.5)
Eosinophils Relative: 0 %
HCT: 28.8 % — ABNORMAL LOW (ref 36.0–46.0)
Hemoglobin: 9.5 g/dL — ABNORMAL LOW (ref 12.0–15.0)
Lymphocytes Relative: 1 %
Lymphs Abs: 0.4 10*3/uL — ABNORMAL LOW (ref 0.7–4.0)
MCH: 31.5 pg (ref 26.0–34.0)
MCHC: 33 g/dL (ref 30.0–36.0)
MCV: 95.4 fL (ref 80.0–100.0)
Monocytes Absolute: 0 10*3/uL — ABNORMAL LOW (ref 0.1–1.0)
Monocytes Relative: 0 %
Neutro Abs: 42.9 10*3/uL — ABNORMAL HIGH (ref 1.7–7.7)
Neutrophils Relative %: 88 %
Platelet Count: 187 10*3/uL (ref 150–400)
RBC: 3.02 MIL/uL — ABNORMAL LOW (ref 3.87–5.11)
RDW: 14.8 % (ref 11.5–15.5)
Smear Review: NORMAL
WBC Count: 43.3 10*3/uL — ABNORMAL HIGH (ref 4.0–10.5)
nRBC: 0 % (ref 0.0–0.2)

## 2021-08-14 LAB — PREGNANCY, URINE: Preg Test, Ur: NEGATIVE

## 2021-08-14 LAB — RESP PANEL BY RT-PCR (RSV, FLU A&B, COVID)  RVPGX2
Influenza A by PCR: POSITIVE — AB
Influenza B by PCR: NEGATIVE
Resp Syncytial Virus by PCR: NEGATIVE
SARS Coronavirus 2 by RT PCR: NEGATIVE

## 2021-08-14 IMAGING — DX DG CHEST 2V
2 series · 2 of 2 positions shown · non-contrast
Comparison: [DATE].

CLINICAL DATA: Fever, cough.

EXAM:
CHEST - 2 VIEW

[chest pa]
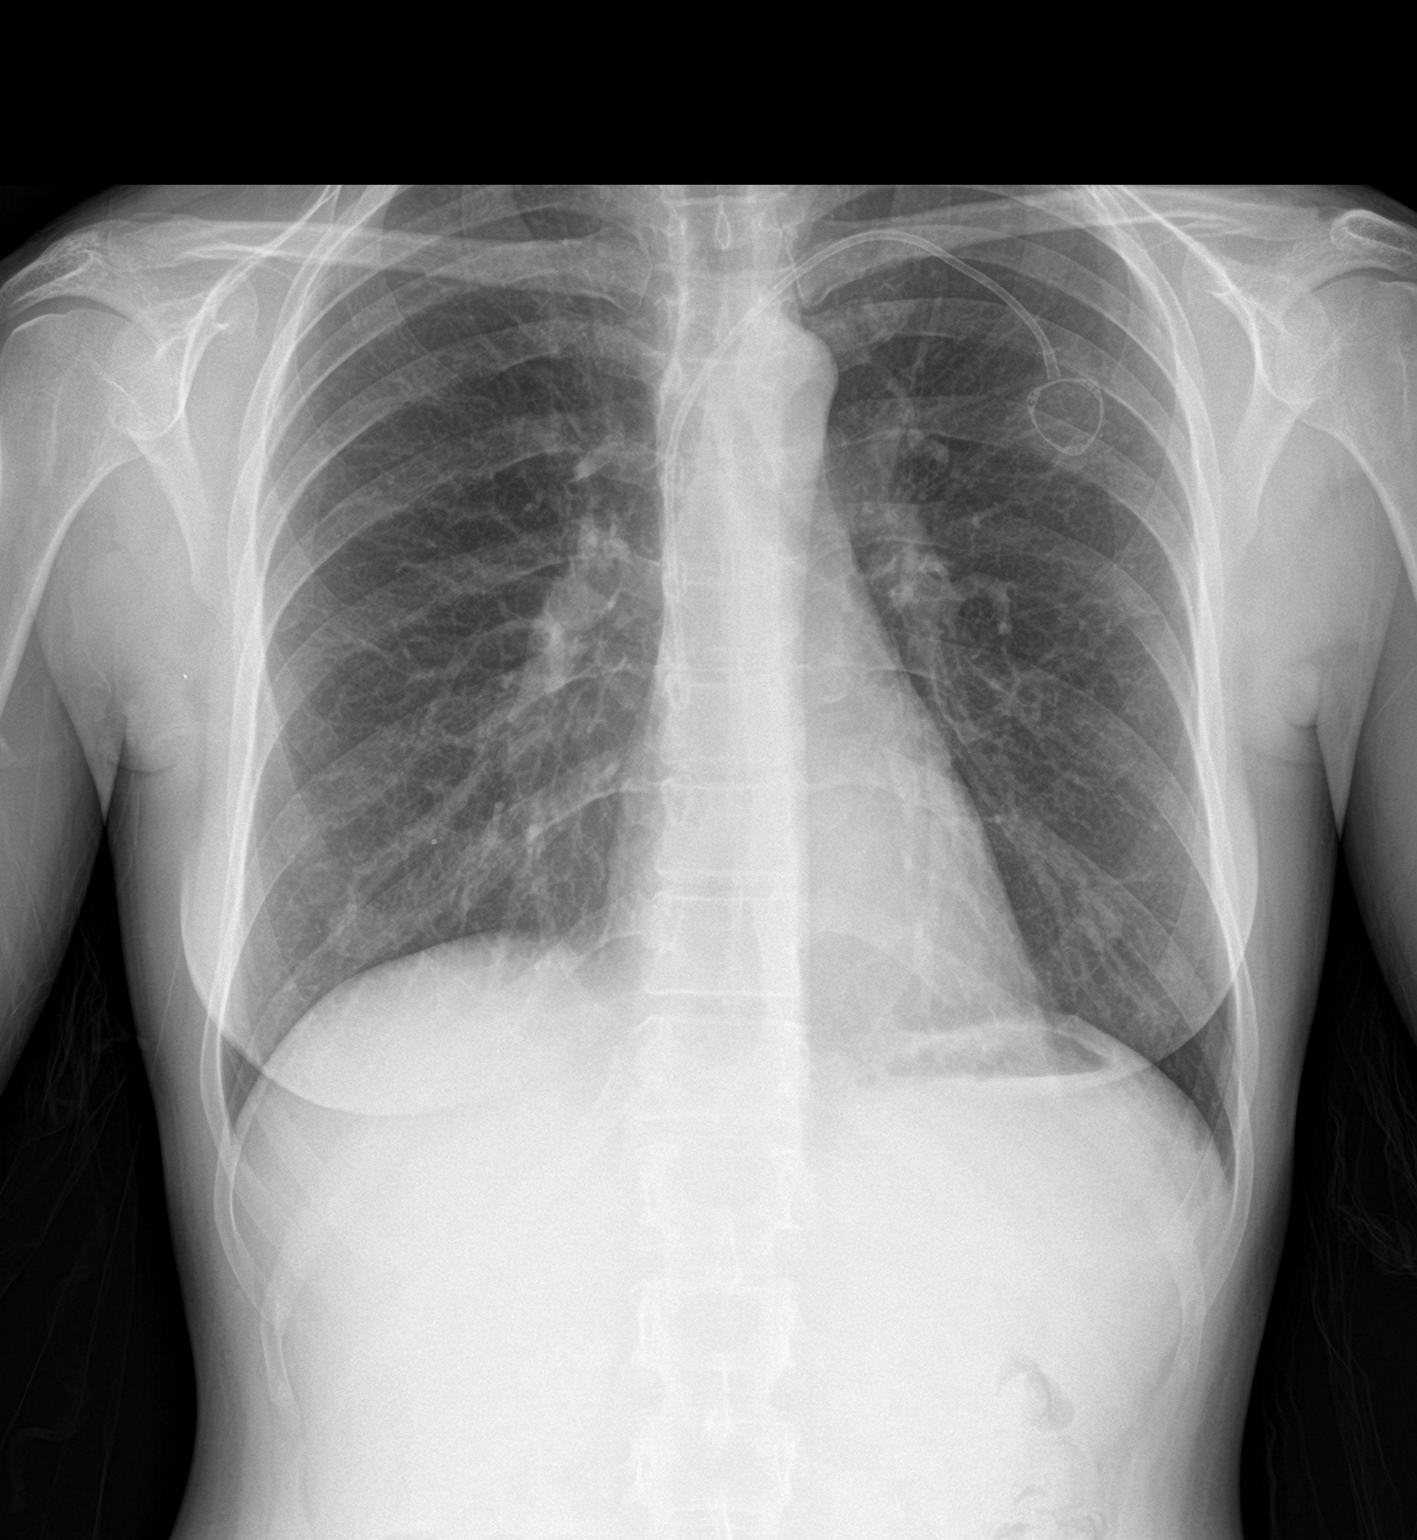

[chest lat]
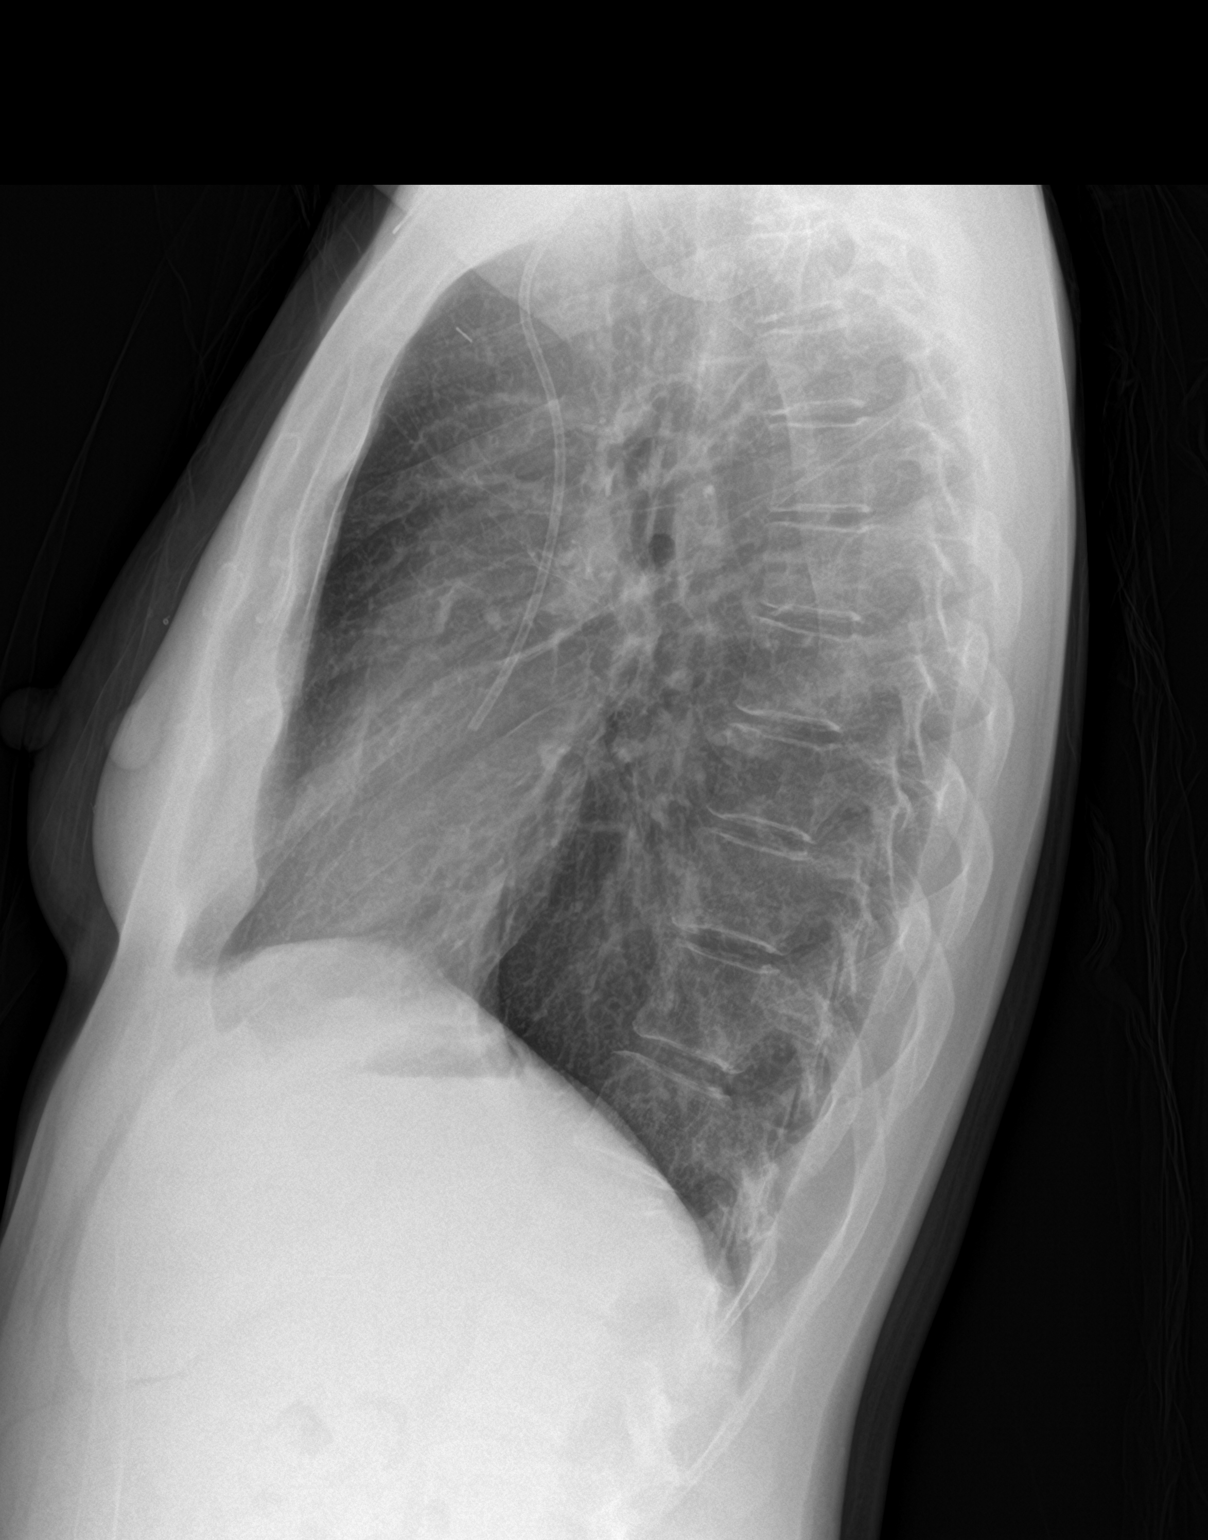

[2 of 2 positions shown; findings below may reference images not displayed]

FINDINGS: The heart size and mediastinal contours are within normal limits.
Both lungs are clear. The visualized skeletal structures are
unremarkable.
IMPRESSION: No active cardiopulmonary disease.

## 2021-08-14 MED ORDER — OSELTAMIVIR PHOSPHATE 75 MG PO CAPS: 75.0000 mg | ORAL_CAPSULE | Freq: Two times a day (BID) | ORAL | 0 refills | Status: DC

## 2021-08-14 MED ORDER — SODIUM CHLORIDE 0.9 % IV SOLN
INTRAVENOUS | Status: AC
  Administered 2021-08-14: 1000 mL via INTRAVENOUS

## 2021-08-14 NOTE — Patient Instructions (Signed)
Please let us know if your fever worsens, or if you develop any other worsening symptoms or concerns.  For life threatening emergencies, please go to the ER.  We are waiting on the viral panel (flu, RSV, COVID) We are waiting on the blood cultures We are waiting on the urine sample to evaluate for infection  The chest xray is negative for infection or any other abnormality.    Your WBC are not decreased.    I will send in antibiotics for you to take if you aren't feeling better in the next 2-3 days.  If you start worsening please call us.    As the above pending tests result, we will update you.    Levofloxacin Injection What is this medication? LEVOFLOXACIN (lee voe FLOX a sin) treats infections caused by bacteria. It belongs to a group of medications called quinolone antibiotics. It will not treat colds, the flu, or infections caused by viruses. This medicine may be used for other purposes; ask your health care provider or pharmacist if you have questions. COMMON BRAND NAME(S): Levaquin What should I tell my care team before I take this medication? They need to know if you have any of these conditions: Bone problems Diabetes Heart disease High blood pressure History of irregular heartbeat History of low levels of potassium in the blood Joint problems Kidney disease Liver disease Mental illness Myasthenia gravis Seizures Tendon problems Tingling of the fingers or toes, or other nerve disorder An unusual or allergic reaction to levofloxacin, other quinolone antibiotics, foods, dyes, or preservatives Pregnant or trying to get pregnant Breast-feeding How should I use this medication? This medication is for infusion into a vein. It is usually given in a hospital or clinic setting. If you get this medication at home, you will be taught how to prepare and give this medication. Use exactly as directed. Take your medication at regular intervals. Do not take your medication more often  than directed. It is important that you put your used needles and syringes in a special sharps container. Do not put them in a trash can. If you do not have a sharps container, call your pharmacist or care team to get one. A special MedGuide will be given to you by the pharmacist with each prescription and refill. Be sure to read this information carefully each time. Talk to your care team about the use of this medication in children. While this medication may be prescribed for children as young as 6 months for selected conditions, precautions do apply. Overdosage: If you think you have taken too much of this medicine contact a poison control center or emergency room at once. NOTE: This medicine is only for you. Do not share this medicine with others. What if I miss a dose? If you miss a dose, use it as soon as you can. If it is almost time for your next dose, use only that dose. Do not use double or extra doses. What may interact with this medication? Do not take this medication with any of the following: Cisapride Dronedarone Pimozide Thioridazine This medication may also interact with the following: Birth control pills Certain medications for diabetes, like glipizide, glyburide, or insulin Certain medications that treat or prevent blood clots like warfarin NSAIDS, medications for pain and inflammation, like ibuprofen or naproxen Other medications that prolong the QT interval (cause an abnormal heart rhythm) like dofetilide, ziprasidone Steroid medications like prednisone or cortisone Theophylline This list may not describe all possible interactions. Give your health  care provider a list of all the medicines, herbs, non-prescription drugs, or dietary supplements you use. Also tell them if you smoke, drink alcohol, or use illegal drugs. Some items may interact with your medicine. What should I watch for while using this medication? Tell your care team if your symptoms do not start to get  better or if they get worse. Do not treat diarrhea with over the counter products. Contact your care team if you have diarrhea that lasts more than 2 days or if it is severe and watery. Check with your care team if you get an attack of severe diarrhea, nausea and vomiting, or if you sweat a lot. The loss of too much body fluid can make it dangerous for you to take this medication. This medication may increase blood sugar. Ask your care team if changes in diet or medications are needed if you have diabetes. You may get drowsy or dizzy. Do not drive, use machinery, or do anything that needs mental alertness until you know how this medication affects you. Do not sit or stand up quickly, especially if you are an older patient. This reduces the risk of dizzy or fainting spells. This medication can make you more sensitive to the sun. Keep out of the sun. If you cannot avoid being in the sun, wear protective clothing and use sunscreen. Do not use sun lamps or tanning beds/booths. What side effects may I notice from receiving this medication? Side effects that you should report to your care team as soon as possible: Allergic reactions--skin rash, itching, hives, swelling of the face, lips, tongue, or throat Heart rhythm changes--fast or irregular heartbeat, dizziness, feeling faint or lightheaded, chest pain, trouble breathing Increased pressure around the brain--severe headache, blurry vision, change in vision, nausea, vomiting Joint, muscle, or tendon pain, swelling, or stiffness Liver injury--right upper belly pain, loss of appetite, nausea, light-colored stool, dark yellow or brown urine, yellowing skin or eyes, unusual weakness or fatigue Mood and behavior changes--anxiety, nervousness, confusion, hallucinations, irritability, hostility, thoughts of suicide or self-harm, worsening mood, feelings of depression Pain, tingling, or numbness in the hands or feet Redness, blistering, peeling, or loosening of  the skin, including inside the mouth Seizures Severe diarrhea, fever Sudden or severe chest, back, or stomach pain Unusual vaginal discharge, itching, or odor Side effects that usually do not require medical attention (report to your care team if they continue or are bothersome): Diarrhea Dizziness Headache Nausea Sensitivity to light Trouble sleeping This list may not describe all possible side effects. Call your doctor for medical advice about side effects. You may report side effects to FDA at 1-800-FDA-1088. Where should I keep my medication? Keep out of the reach of children. If you are using this medicine at home, you will be instructed on how to store this medicine. Throw away any unused medicine after the expiration date on the label. NOTE: This sheet is a summary. It may not cover all possible information. If you have questions about this medicine, talk to your doctor, pharmacist, or health care provider.  2022 [employer] (2020-11-07 00:00:00)

## 2021-08-14 NOTE — Assessment & Plan Note (Addendum)
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    1. Breast Cancer: She is status post chemotherapy.  She tolerated this moderately well.  She is not neutropenic and her labs are stable since receiving this.  2.  Fever.  She has undergone a full fever work-up.  After review of this following the completion of her visit it demonstrates that she is positive for influenza A.  I have sent Tamiflu into her pharmacy.  We had initially discussed sending in Levaquin for her to take if she gets worse.  This is no longer necessary considering her influenza A is positive.  She also underwent blood cultures which are pending and urine culture which is pending.  We reviewed supportive care measures in detail for viral illnesses.  We will see her back on August 24, 2021 for labs follow-up and her first cycle of Taxol.

## 2021-08-14 NOTE — Progress Notes (Signed)
[facility] Follow up:    Antony Contras, MD [place] [address]   DIAGNOSIS:  Cancer Staging  Malignant neoplasm of upper-outer quadrant of right breast in female, estrogen receptor positive ([place]) Staging form: Breast, AJCC 8th Edition - Clinical stage from 06/10/2021: Stage IIA (cT2, cN0, cM0, G3, ER+, PR+, HER2-) - Signed by Chauncey Cruel, MD on 06/10/2021 Stage prefix: Initial diagnosis Histologic grading system: 3 grade system Laterality: Right Staged by: Pathologist and managing physician Stage used in treatment planning: Yes National guidelines used in treatment planning: Yes Type of national guideline used in treatment planning: NCCN   SUMMARY OF ONCOLOGIC HISTORY: Oncology History  Malignant neoplasm of upper-outer quadrant of right breast in female, estrogen receptor positive ([place])  06/04/2021 Initial Diagnosis   [place] woman status post right breast upper outer quadrant biopsy 06/04/2021 for a clinical mT2 N0 invasive ductal carcinoma, grade 3, estrogen and progesterone receptor inadequate (functionally triple negative with HER2 not amplified), with an MIB-1 of 70%             (A) chest CT with contrast 06/22/2021 shows right breast mass, no other lesions of concern; pulmonary nodules all less than 5 mm noted, likely benign             (B) bone scan 06/22/2021 negative for metastases; consistent with sacroiliitis   06/10/2021 Cancer Staging   Staging form: Breast, AJCC 8th Edition - Clinical stage from 06/10/2021: Stage IIA (cT2, cN0, cM0, G3, ER+, PR+, HER2-) - Signed by Chauncey Cruel, MD on 06/10/2021 Stage prefix: Initial diagnosis Histologic grading system: 3 grade system Laterality: Right Staged by: Pathologist and managing physician Stage used in treatment planning: Yes National guidelines used in treatment planning: Yes Type of national guideline used in treatment planning: NCCN    06/16/2021 Genetic  Testing   [facility] CancerNext-Expanded was negative. No pathogenic variants were identified. 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 Report date is 06/19/2021.  The CancerNext-Expanded gene panel offered by [facility] and includes sequencing, rearrangement, and RNA analysis for the following 77 genes: AIP, ALK, APC, ATM, AXIN2, BAP1, BARD1, BLM, BMPR1A, BRCA1, BRCA2, BRIP1, CDC73, CDH1, CDK4, CDKN1B, CDKN2A, CHEK2, CTNNA1, DICER1, FANCC, FH, FLCN, GALNT12, KIF1B, LZTR1, MAX, MEN1, MET, MLH1, MSH2, MSH3, MSH6, MUTYH, NBN, NF1, NF2, NTHL1, PALB2, PHOX2B, PMS2, POT1, PRKAR1A, PTCH1, PTEN, RAD51C, RAD51D, RB1, RECQL, RET, SDHA, SDHAF2, SDHB, SDHC, SDHD, SMAD4, SMARCA4, SMARCB1, SMARCE1, STK11, SUFU, TMEM127, TP53, TSC1, TSC2, VHL and XRCC2 (sequencing and deletion/duplication); EGFR, EGLN1, HOXB13, KIT, MITF, PDGFRA, POLD1, and POLE (sequencing only); EPCAM and GREM1 (deletion/duplication only).    06/25/2021 -  Neo-Adjuvant Chemotherapy   neoadjuvant chemotherapy consisting of doxorubicin and cyclophosphamide in dose dense fashion x4 started 0/13/2022, completed 08/10/2021, to be followed by weekly carboplatin and paclitaxel x12 starting 08/24/2021     CURRENT THERAPY: neoadjuvant chemo, s/p cycle 4 of Doxorubicin/Cyclophosphamide  INTERVAL HISTORY: Ebony Lamb 45 y.o. female returns for evaluation after receiving her chemotherapy with doxorubicin and cyclophosphamide that was given on Monday, August 10, 2021.  She woke up this morning with a fever of 100.4.  She called our office, and considering her previous chemotherapy which can cause neutropenia we asked for her to come in for evaluation.  She has been having on and off issues with nasal congestion and cough.  Her son 2 weeks ago had the flu.  She was symptomatic with similar symptoms during  that time.     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  MEDICAL HISTORY: Past Medical History:  Diagnosis Date   Abnormal Pap smear 09/13/2000   Allergy 02/2020   Seasonal   Cancer ([place])    Depression    history of depression- denies currently   H/O varicella    Hypertension 09/14/2011   post partum- meds x 1 mo   No pertinent past medical history    Preterm labor     SURGICAL HISTORY: Past Surgical History:  Procedure Laterality Date   BREAST BIOPSY Right 07/27/2019   CESAREAN SECTION  11/14/2011   Procedure: CESAREAN SECTION;  Surgeon: Eli Hose, MD;  Location: [facility];  Service: Gynecology;  Laterality: N/A;  Primary cesarean section with delivery of baby boy at [phone]. Apgars 9/9.   CESAREAN SECTION N/A 03/22/2014   Procedure: Repeat CESAREAN SECTION;  Surgeon: Alwyn Pea, MD;  Location: [facility];  Service: Obstetrics;  Laterality: N/A;   COSMETIC SURGERY  11/22/2017   Abdominoplasty   FOOT SURGERY     FRACTURE SURGERY  08/1999   Repair of 5th metatarsal in left foot   HERNIA REPAIR  11/22/2017   PORTACATH PLACEMENT N/A 06/24/2021   Procedure: INSERTION PORT-A-CATH;  Surgeon: Coralie Keens, MD;  Location: [facility];  Service: General;  Laterality: N/A;   TUBAL LIGATION  11/22/2017   Tubes were completely removed   WISDOM TOOTH EXTRACTION      SOCIAL  HISTORY: Social History   Socioeconomic History   Marital status: Married    Spouse name: Not on file   Number of children: Not on file   Years of education: Not on file   Highest education level: Not on file  Occupational History   Not on file  Tobacco Use   Smoking status: Never   Smokeless tobacco: Never  Vaping Use   Vaping Use: Never used  Substance and Sexual Activity   Alcohol use: Yes    Alcohol/week: 7.0 standard drinks    Types: 7 Standard drinks or equivalent per week    Comment: I stopped drinking alcohol after finding the lump in my brea   Drug use: No   Sexual activity: Yes    Birth control/protection: Surgical  Other Topics Concern   Not on file  Social History Narrative   Not on file   Social Determinants of Health   Financial Resource Strain: Low Risk    Difficulty of Paying Living Expenses: Not very hard  Food Insecurity: No Food Insecurity   Worried About Charity fundraiser in the Last Year: Never true   Ran Out of Food in the Last Year: Never true  Transportation Needs: No Transportation Needs   Lack of Transportation (Medical): No   Lack of Transportation (Non-Medical): No  Physical  Activity: Not on file  Stress: Not on file  Social Connections: Not on file  Intimate Partner Violence: Not on file    FAMILY HISTORY: Family History  Problem Relation Age of Onset   Breast cancer Mother 18   Osteopenia Mother    Arthritis Mother    Other Mother        BRCA2 gene mutation   Hypertension Father    Kidney disease Father    Depression Father    Hyperlipidemia Father    Melanoma Father 49   Hypertension Brother    Hyperlipidemia Brother    Breast cancer Maternal Grandmother        dx. late 30s/40s   Heart disease Maternal Grandfather    Other Maternal Grandfather        BRCA2 gene mutation   Ovarian cancer Paternal Grandmother        dx. 20s   Stroke Paternal Grandfather    Birth defects Son    Anesthesia problems Neg Hx    Diabetes  Neg Hx     Review of Systems  Constitutional:  Negative for appetite change, chills, fatigue, fever and unexpected weight change.  HENT:   Negative for hearing loss, lump/mass and trouble swallowing.   Eyes:  Negative for eye problems and icterus.  Respiratory:  Negative for chest tightness, cough and shortness of breath.   Cardiovascular:  Negative for chest pain, leg swelling and palpitations.  Gastrointestinal:  Negative for abdominal distention, abdominal pain, constipation, diarrhea, nausea and vomiting.  Endocrine: Negative for hot flashes.  Genitourinary:  Negative for difficulty urinating.   Musculoskeletal:  Negative for arthralgias.  Skin:  Negative for itching and rash.  Neurological:  Negative for dizziness, extremity weakness, headaches and numbness.  Hematological:  Negative for adenopathy. Does not bruise/bleed easily.  Psychiatric/Behavioral:  Negative for depression. The patient is not nervous/anxious.      PHYSICAL EXAMINATION  ECOG PERFORMANCE STATUS: 2  Vitals:   08/14/21 1021  BP: 108/66  Pulse: 97  Resp: 18  Temp: (!) 100.8 F (38.2 C)  SpO2: 100%    Physical Exam Constitutional:      General: She is not in acute distress.    Appearance: Normal appearance. She is not toxic-appearing.  HENT:     Head: Normocephalic and atraumatic.  Eyes:     General: No scleral icterus. Cardiovascular:     Rate and Rhythm: Normal rate and regular rhythm.     Pulses: Normal pulses.     Heart sounds: Normal heart sounds.  Pulmonary:     Effort: Pulmonary effort is normal.     Breath sounds: Normal breath sounds.  Abdominal:     General: Abdomen is flat. Bowel sounds are normal. There is no distension.     Palpations: Abdomen is soft.     Tenderness: There is no abdominal tenderness.  Musculoskeletal:        General: No swelling.     Cervical back: Neck supple.  Lymphadenopathy:     Cervical: No cervical adenopathy.  Skin:    General: Skin is warm and dry.      Findings: No rash.  Neurological:     General: No focal deficit present.     Mental Status: She is alert.  Psychiatric:        Mood and Affect: Mood normal.        Behavior: Behavior normal.    LABORATORY DATA: Appointment on 08/14/2021  Component Date Value Ref Range Status  Color, Urine 08/14/2021 YELLOW  YELLOW Final   APPearance 08/14/2021 HAZY (A)  CLEAR Final   Specific Gravity, Urine 08/14/2021 1.015  1.005 - 1.030 Final   pH 08/14/2021 5.0  5.0 - 8.0 Final   Glucose, UA 08/14/2021 NEGATIVE  NEGATIVE mg/dL Final   Hgb urine dipstick 08/14/2021 NEGATIVE  NEGATIVE Final   Bilirubin Urine 08/14/2021 NEGATIVE  NEGATIVE Final   Ketones, ur 08/14/2021 NEGATIVE  NEGATIVE mg/dL Final   Protein, ur 08/14/2021 NEGATIVE  NEGATIVE mg/dL Final   Nitrite 08/14/2021 NEGATIVE  NEGATIVE Final   Leukocytes,Ua 08/14/2021 NEGATIVE  NEGATIVE Final   RBC / HPF 08/14/2021 0-5  0 - 5 RBC/hpf Final   WBC, UA 08/14/2021 0-5  0 - 5 WBC/hpf Final   Bacteria, UA 08/14/2021 RARE (A)  NONE SEEN Final   Squamous Epithelial / LPF 08/14/2021 0-5  0 - 5 Final   Mucus 08/14/2021 PRESENT   Final   Ca Oxalate Crys, UA 08/14/2021 PRESENT   Final   Performed at [facility], [place] [address], [place], [place] [address]  Appointment on 08/14/2021  Component Date Value Ref Range Status   WBC Count 08/14/2021 43.3 (H)  4.0 - 10.5 K/uL Final   RBC 08/14/2021 3.02 (L)  3.87 - 5.11 MIL/uL Final   Hemoglobin 08/14/2021 9.5 (L)  12.0 - 15.0 g/dL Final   HCT 08/14/2021 28.8 (L)  36.0 - 46.0 % Final   MCV 08/14/2021 95.4  80.0 - 100.0 fL Final   MCH 08/14/2021 31.5  26.0 - 34.0 pg Final   MCHC 08/14/2021 33.0  30.0 - 36.0 g/dL Final   RDW 08/14/2021 14.8  11.5 - 15.5 % Final   Platelet Count 08/14/2021 187  150 - 400 K/uL Final   nRBC 08/14/2021 0.0  0.0 - 0.2 % Final   Neutrophils Relative % 08/14/2021 88  % Final   Neutro Abs 08/14/2021 42.9 (H)  1.7 - 7.7 K/uL Final   Band Neutrophils  08/14/2021 11  % Final   Lymphocytes Relative 08/14/2021 1  % Final   Lymphs Abs 08/14/2021 0.4 (L)  0.7 - 4.0 K/uL Final   Monocytes Relative 08/14/2021 0  % Final   Monocytes Absolute 08/14/2021 0.0 (L)  0.1 - 1.0 K/uL Final   Eosinophils Relative 08/14/2021 0  % Final   Eosinophils Absolute 08/14/2021 0.0  0.0 - 0.5 K/uL Final   Basophils Relative 08/14/2021 0  % Final   Basophils Absolute 08/14/2021 0.0  0.0 - 0.1 K/uL Final   WBC Morphology 08/14/2021 MORPHOLOGY UNREMARKABLE   Final   RBC Morphology 08/14/2021 MORPHOLOGY UNREMARKABLE   Final   Smear Review 08/14/2021 Normal platelet morphology   Final   Abs Immature Granulocytes 08/14/2021 0.00  0.00 - 0.07 K/uL Final   Performed at [facility], [place] [address] [place], [place] [address]   Sodium 08/14/2021 136  135 - 145 mmol/L Final   Potassium 08/14/2021 3.6  3.5 - 5.1 mmol/L Final   Chloride 08/14/2021 105  98 - 111 mmol/L Final   CO2 08/14/2021 22  22 - 32 mmol/L Final   Glucose, Bld 08/14/2021 103 (H)  70 - 99 mg/dL Final   Glucose reference range applies only to samples taken after fasting for at least 8 hours.   BUN 08/14/2021 11  6 - 20 mg/dL Final   Creatinine 08/14/2021 0.64  0.44 - 1.00 mg/dL Final   Calcium 08/14/2021 8.1 (L)  8.9 - 10.3 mg/dL Final  Total Protein 08/14/2021 5.8 (L)  6.5 - 8.1 g/dL Final   Albumin 08/14/2021 3.5  3.5 - 5.0 g/dL Final   AST 08/14/2021 15  15 - 41 U/L Final   ALT 08/14/2021 17  0 - 44 U/L Final   Alkaline Phosphatase 08/14/2021 145 (H)  38 - 126 U/L Final   Total Bilirubin 08/14/2021 0.3  0.3 - 1.2 mg/dL Final   GFR, Estimated 08/14/2021 >60  >60 mL/min Final   Comment: (NOTE) Calculated using the CKD-EPI Creatinine Equation (2021)    Anion gap 08/14/2021 9  5 - 15 Final   Performed at [facility], [place] [address], [place], [place] [address]   Specimen Description 08/14/2021    Final                   Value:BLOOD LEFT  ANTECUBITAL Performed at [facility], [place] [address] [place], [place] [address]    Special Requests 08/14/2021    Final                   Value:BOTTLES DRAWN AEROBIC AND ANAEROBIC Blood Culture results may not be optimal due to an excessive volume of blood received in culture bottles Performed at [place] [address], [place], [place] [address]    Culture 08/14/2021 PENDING   Incomplete   Report Status 08/14/2021 PENDING   Incomplete   Preg Test, Ur 08/14/2021 NEGATIVE  NEGATIVE Final   Comment:        THE SENSITIVITY OF THIS METHODOLOGY IS >20 mIU/mL. Performed at [facility], [place] [address], [place], [place] [address]   Orders Only on 08/14/2021  Component Date Value Ref Range Status   SARS Coronavirus 2 by RT PCR 08/14/2021 NEGATIVE  NEGATIVE Final   Comment: (NOTE) SARS-CoV-2 target nucleic acids are NOT DETECTED.  The SARS-CoV-2 RNA is generally detectable in upper respiratory specimens during the acute phase of infection. The lowest concentration of SARS-CoV-2 viral copies this assay can detect is 138 copies/mL. A negative result does not preclude SARS-Cov-2 infection and should not be used as the sole basis for treatment or other patient management decisions. A negative result may occur with  improper specimen collection/handling, submission of specimen other than nasopharyngeal swab, presence of viral mutation(s) within the areas targeted by this assay, and inadequate number of viral copies(<138 copies/mL). A negative result must be combined with clinical observations, patient history, and epidemiological information. The expected result is Negative.  Fact Sheet for Patients:  EntrepreneurPulse.com.au  Fact Sheet for Healthcare Providers:  IncredibleEmployment.be  This test is no                          t yet approved or cleared by the [place] FDA and  has  been authorized for detection and/or diagnosis of SARS-CoV-2 by FDA under an Emergency Use Authorization (EUA). This EUA will remain  in effect (meaning this test can be used) for the duration of the COVID-19 declaration under Section 564(b)(1) of the Act, 21 U.S.C.section 360bbb-3(b)(1), unless the authorization is terminated  or revoked sooner.       Influenza A by PCR 08/14/2021 POSITIVE (A)  NEGATIVE Final   Influenza B by PCR 08/14/2021 NEGATIVE  NEGATIVE Final   Comment: (NOTE) The Xpert Xpress SARS-CoV-2/FLU/RSV plus assay is intended as an aid in the diagnosis of influenza from Nasopharyngeal swab specimens and should not  be used as a sole basis for treatment. Nasal washings and aspirates are unacceptable for Xpert Xpress SARS-CoV-2/FLU/RSV testing.  Fact Sheet for Patients: EntrepreneurPulse.com.au  Fact Sheet for Healthcare Providers: IncredibleEmployment.be  This test is not yet approved or cleared by the [place] FDA and has been authorized for detection and/or diagnosis of SARS-CoV-2 by FDA under an Emergency Use Authorization (EUA). This EUA will remain in effect (meaning this test can be used) for the duration of the COVID-19 declaration under Section 564(b)(1) of the Act, 21 U.S.C. section 360bbb-3(b)(1), unless the authorization is terminated or revoked.     Resp Syncytial Virus by PCR 08/14/2021 NEGATIVE  NEGATIVE Final   Comment: (NOTE) Fact Sheet for Patients: EntrepreneurPulse.com.au  Fact Sheet for Healthcare Providers: IncredibleEmployment.be  This test is not yet approved or cleared by the [place] FDA and has been authorized for detection and/or diagnosis of SARS-CoV-2 by FDA under an Emergency Use Authorization (EUA). This EUA will remain in effect (meaning this test can be used) for the duration of the COVID-19 declaration under Section 564(b)(1) of the Act, 21  U.S.C. section 360bbb-3(b)(1), unless the authorization is terminated or revoked.  Performed at [facility], [place] [address], [place], [place] [address]     RADIOGRAPHIC STUDIES:  DG Chest 2 View  Result Date: 08/14/2021 CLINICAL DATA:  Fever, cough. EXAM: CHEST - 2 VIEW COMPARISON:  June 30, 2021. FINDINGS: The heart size and mediastinal contours are within normal limits. Both lungs are clear. The visualized skeletal structures are unremarkable. IMPRESSION: No active cardiopulmonary disease. Electronically Signed   By: Marijo Conception M.D.   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    1. Breast Cancer: She is status post chemotherapy.  She tolerated this moderately well.  She is not neutropenic and her labs are stable since receiving this.  2.  Fever.  She has undergone a full fever work-up.  After review of this following the completion of her visit it demonstrates that she is positive for influenza A.  I have sent Tamiflu into her pharmacy.  We had initially discussed sending in Levaquin for her to take if she gets worse.  This is no longer necessary considering her influenza A is positive.  She also underwent blood cultures which are pending and urine culture which is pending.  We reviewed supportive care measures in detail for viral illnesses.  We will see her back on August 24, 2021 for labs follow-up and her first cycle of Taxol.        All questions were answered. The patient knows to call the clinic with any problems, questions or concerns. We can certainly see the patient much sooner if necessary.  Total encounter time: 30 minutes   Wilber Bihari, NP 08/14/21  1:06 PM Medical Oncology and Hematology [facility] [place], [place] [address] Tel. [phone]    Fax. [phone]  *Total Encounter Time as defined by the Centers for Medicare and Medicaid Services includes, in addition to the face-to-face time of a patient visit (documented in the note above) non-face-to-face time: obtaining and reviewing outside history, ordering and reviewing medications, tests or procedures, care coordination (communications with other health care  professionals or caregivers) and documentation in the medical record.

## 2021-08-16 LAB — URINE CULTURE: Culture: NO GROWTH

## 2021-08-18 ENCOUNTER — Telehealth: Payer: Self-pay

## 2021-08-18 NOTE — Telephone Encounter (Signed)
Called pt, per Mendel Ryder, blood cultures did not show growth.  Pt reports to be feeling better, no fever for 24+ hours.  She did have 1 episode of vomitting but took anti nausea meds and no n/v since (yesterday AM).  Instructed pt to let us know if she has any new or worsening symptoms, pt verbalized understanding and thanks.

## 2021-08-19 LAB — CULTURE, BLOOD (SINGLE)
Culture: NO GROWTH
Culture: NO GROWTH
Special Requests: ADEQUATE

## 2021-08-20 ENCOUNTER — Ambulatory Visit: Payer: No Typology Code available for payment source | Admitting: Oncology

## 2021-08-20 ENCOUNTER — Ambulatory Visit: Payer: No Typology Code available for payment source

## 2021-08-20 ENCOUNTER — Other Ambulatory Visit: Payer: No Typology Code available for payment source

## 2021-08-20 ENCOUNTER — Telehealth: Payer: Self-pay | Admitting: *Deleted

## 2021-08-20 ENCOUNTER — Encounter: Payer: No Typology Code available for payment source | Admitting: Dietician

## 2021-08-20 NOTE — Telephone Encounter (Signed)
Pt called to discuss transition to Dr. Lindi Adie after Dr. Dixie Dials. Pt would like to see Dr. Lindi Adie to start her relationship as she's feeling anxiety switching providers in the middle of her neoadjuvant chemo. Scheduled and confirmed appt on 12/15 at 8:15am.  Pt informed she is still deciding on type of surgery and if she will have reconstruction or not. After meeting with Dr. Donne Hazel for 2nd opinion, she would like to have him as her surgeon.  No further needs or questions voiced at this time. Encourage pt to call with concerns.

## 2021-08-23 NOTE — Progress Notes (Signed)
[place]  Telephone:(336) [phone] Fax:(336) [phone]    ID: Ebony Lamb DOB: [birth year]  MR#: [id number]  CSN#:[id number]  Patient Care Team: Antony Contras, MD as PCP - General (Family Medicine) Coralie Keens, MD as Consulting Physician (General Surgery) Skylynne Schlechter, Virgie Dad, MD as Consulting Physician (Oncology) Eppie Gibson, MD as Attending Physician (Radiation Oncology) Rockwell Germany, RN as Oncology Nurse Navigator Mauro Kaufmann, RN as Oncology Nurse Navigator Rozetta Nunnery, MD as Consulting Physician (Otolaryngology) Sanjuana Kava, MD as Referring Physician (Obstetrics and Gynecology) [name], MD as Consulting Physician (Plastic Surgery) Chauncey Cruel, MD OTHER MD:   CHIEF COMPLAINT: Weakly estrogen receptor positive breast cancer  CURRENT TREATMENT: Neoadjuvant chemotherapy   INTERVAL HISTORY: Ebony Lamb returns today for follow up of her weakly estrogen receptor positive breast cancer.  She is accompanied by her husband  She completed 4 cycles of doxorubicin and cyclophosphamide on 08/10/2021. She tolerated these generally well, with no evidence of end-organ dysfunction.  We assessed initial response with breast ultrasonography 08/11/2021 and this showed the original mass to have shrunk from 3.0 originally to 1.6 currently.  A second satellite lesion had shrunk from 5 to 4 mm and a third lesion from all 5 to 4 mm as well.  The left axillary lymph node measured 3 mm compared to 5 mm previously  She is now ready to begin weekly carboplatin and paclitaxel, to be given x12.   REVIEW OF SYSTEMS: Ebony Lamb and her entire family had the flu recently.  She is still recovering.  She is working from home.  She has a little bit of a cough and a runny nose but is eating well and is hydrating herself well.  She is having some bright red blood per rectum basically finding blood on the tissue after wiping.  She does have soft bowel movements most of the time  she says.  She does not strain she says.  She had similar symptoms during pregnancy and used Proctofoam at that time with some success.  A detailed review of systems was otherwise noncontributory   COVID 19 VACCINATION STATUS: Pfizer x3; infection 01/2021   HISTORY OF CURRENT ILLNESS: From the original intake note:  Ebony Lamb (pronounced "REECE") has a history of previous right breast biopsy (8 o'clock) in 07/2019 that was negative.  More recently she presented with a palpable right breast mass at 12 o'clock. She underwent right diagnostic mammography with tomography and right breast ultrasonography at [facility] on 06/01/2021 showing: breast density category D; palpable 3 cm irregular mass in right breast at 12 o'clock; possible 5 mm satellite lesion located 2.5 cm from dominant mass; abnormal right axillary lymph node.  Accordingly on 06/04/2021 she proceeded to biopsy of the right breast area in question. The pathology from this procedure ([id number]) showed: invasive ductal carcinoma, grade 3. Prognostic indicators significant for: estrogen receptor, 15% positive and progesterone receptor, 15% positive, both with weak-moderate staining intensity. Proliferation marker Ki67 at 70%. HER2 equivocal by immunohistochemistry (2+), but negative by fluorescent in situ hybridization with a signals ratio 1.47 and number per cell 2.50.  Biopsy of the questionable lymph node was negative and concordant   Cancer Staging  Malignant neoplasm of upper-outer quadrant of right breast in female, estrogen receptor positive ([place]) Staging form: Breast, AJCC 8th Edition - Clinical stage from 06/10/2021: Stage IIA (cT2, cN0, cM0, G3, ER+, PR+, HER2-) - Signed by Chauncey Cruel, MD on 06/10/2021 Stage prefix: Initial diagnosis Histologic grading  system: 3 grade system Laterality: Right Staged by: Pathologist and managing physician Stage used in treatment planning: Yes National guidelines used in treatment  planning: Yes Type of national guideline used in treatment planning: NCCN  The patient's subsequent history is as detailed below.   PAST MEDICAL HISTORY: Past Medical History:  Diagnosis Date   Abnormal Pap smear 09/13/2000   Allergy 02/2020   Seasonal   Cancer ([place])    Depression    history of depression- denies currently   H/O varicella    Hypertension 09/14/2011   post partum- meds x 1 mo   No pertinent past medical history    Preterm labor     PAST SURGICAL HISTORY: Past Surgical History:  Procedure Laterality Date   BREAST BIOPSY Right 07/27/2019   CESAREAN SECTION  11/14/2011   Procedure: CESAREAN SECTION;  Surgeon: Eli Hose, MD;  Location: [facility];  Service: Gynecology;  Laterality: N/A;  Primary cesarean section with delivery of baby boy at [phone]. Apgars 9/9.   CESAREAN SECTION N/A 03/22/2014   Procedure: Repeat CESAREAN SECTION;  Surgeon: Alwyn Pea, MD;  Location: [facility];  Service: Obstetrics;  Laterality: N/A;   COSMETIC SURGERY  11/22/2017   Abdominoplasty   FOOT SURGERY     FRACTURE SURGERY  08/1999   Repair of 5th metatarsal in left foot   HERNIA REPAIR  11/22/2017   PORTACATH PLACEMENT N/A 06/24/2021   Procedure: INSERTION PORT-A-CATH;  Surgeon: Coralie Keens, MD;  Location: [facility];  Service: General;  Laterality: N/A;   TUBAL LIGATION  11/22/2017   Tubes were completely removed   WISDOM TOOTH EXTRACTION      FAMILY HISTORY: Family History  Problem Relation Age of Onset   Breast cancer Mother 29   Osteopenia Mother    Arthritis Mother    Other Mother        BRCA2 gene mutation   Hypertension Father    Kidney disease Father    Depression Father    Hyperlipidemia Father    Melanoma Father 82   Hypertension Brother    Hyperlipidemia Brother    Breast cancer Maternal Grandmother        dx. late 30s/40s   Heart disease Maternal Grandfather    Other Maternal Grandfather        BRCA2 gene mutation   Ovarian cancer  Paternal Grandmother        dx. 68s   Stroke Paternal Grandfather    Birth defects Son    Anesthesia problems Neg Hx    Diabetes Neg Hx    Her parents are both living, her father age 108 and her mother age 82 as of 05/2021. Jacora has one brother (and no sisters). She reports breast cancer in her mother at age 25 and her maternal grandmother in her late 44's, melanoma in her father at age 51, and ovarian cancer in her paternal grandmother in her 80's.   GYNECOLOGIC HISTORY:  No LMP recorded. Menarche: 45 years old Age at first live birth: 45 years old [place] P 2 LMP 06/06/2021 Contraceptive: used from 1996-2003 and 2006-2012 HRT n/a  Hysterectomy? no BSO? 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 Husband Ebony Lamb work for [employer] in [employer]. She lives at home with Ebony Lamb and their two children-- Ebony Lamb, age 80, and Ebony Lamb, age 72. 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    ADVANCED DIRECTIVES: in  place   HEALTH MAINTENANCE: Social History   Tobacco Use   Smoking status: Never   Smokeless tobacco: Never  Vaping Use   Vaping Use: Never used  Substance Use Topics   Alcohol use: Yes    Alcohol/week: 7.0 standard drinks    Types: 7 Standard drinks or equivalent per week    Comment: I stopped drinking alcohol after finding the lump in my brea   Drug use: No     Colonoscopy: never done (age)  PAP: 06/2020  Bone density: never done (age)   No Known Allergies  Current Outpatient Medications  Medication Sig Dispense Refill   albuterol (VENTOLIN HFA) 108 (90 Base) MCG/ACT inhaler Inhale 2 puffs into the lungs every 6 (six) hours as needed for wheezing or shortness of breath. 8 g 2   dexamethasone (DECADRON) 1 MG tablet Take 1 or 2 tablets twice daily as instructed (together with 4 mg dexamethasone tablet) 20 tablet 1   dexamethasone (DECADRON) 4 MG tablet Take 1 tablet (4 mg total) by mouth 2 (two) times  daily. Take daily for 3 days after chemo. Take with food. 30 tablet 1   doxycycline (VIBRA-TABS) 100 MG tablet Take 1 tablet (100 mg total) by mouth 2 (two) times daily. 10 tablet 0   doxycycline (VIBRA-TABS) 100 MG tablet Take 1 tablet (100 mg total) by mouth 2 (two) times daily. 14 tablet 0   lidocaine-prilocaine (EMLA) cream Apply to affected area once 30 g 3   loratadine (CLARITIN) 10 MG tablet Take 1 tablet (10 mg total) by mouth daily. Take day post chemo and then daily for 10 days with each treatment 30 tablet 3   LORazepam (ATIVAN) 0.5 MG tablet Take 1 tablet (0.5 mg total) by mouth at bedtime as needed (Nausea or vomiting). 30 tablet 0   ondansetron (ZOFRAN) 8 MG tablet Take 1 tablet (8 mg total) by mouth every 8 (eight) hours as needed for nausea or vomiting. May take starting day 3 of chemotherapy 20 tablet 0   oseltamivir (TAMIFLU) 75 MG capsule Take 1 capsule (75 mg total) by mouth 2 (two) times daily. 10 capsule 0   pramoxine-hydrocortisone cream Apply topically 3 (three) times daily. 57 g 0   prochlorperazine (COMPAZINE) 10 MG tablet Take 1 tablet (10 mg total) by mouth every 6 (six) hours as needed (Nausea or vomiting). 30 tablet 1   traMADol (ULTRAM) 50 MG tablet Take 1-2 tablets (50-100 mg total) by mouth every 6 (six) hours as needed. 20 tablet 0   No current facility-administered medications for this visit.   Facility-Administered Medications Ordered in Other Visits  Medication Dose Route Frequency Provider Last Rate Last Admin   influenza vac split quadrivalent PF (FLUARIX) 0.5 ML injection            influenza vac split quadrivalent PF (FLUARIX) injection 0.5 mL  0.5 mL Intramuscular Once Sherial Ebrahim, Virgie Dad, MD       sodium chloride flush (NS) 0.9 % injection 10 mL  10 mL Intracatheter PRN Vahe Pienta, Virgie Dad, MD   10 mL at 08/24/21 1744   sodium chloride flush (NS) 0.9 % injection 500 mL  500 mL Intravenous Once Clovis Warwick, Virgie Dad, MD        OBJECTIVE: White woman in no  acute distress  Vitals:   08/24/21 1249  BP: 118/81  Pulse: 100  Temp: 97.7 F (36.5 C)  SpO2: 99%       Body mass index is 19.17 kg/m.  Wt Readings from Last 3 Encounters:  08/24/21 118 lb 12.8 oz (53.9 kg)  08/14/21 127 lb 3.2 oz (57.7 kg)  07/23/21 121 lb 12.8 oz (55.2 kg)     ECOG FS:1 - Symptomatic but completely ambulatory  Sclerae unicteric, EOMs intact Wearing a mask No cervical or supraclavicular adenopathy Lungs no rales or rhonchi Heart regular rate and rhythm Abd soft, nontender, positive bowel sounds MSK no focal spinal tenderness, no upper extremity lymphedema Neuro: nonfocal, well oriented, appropriate affect Breasts: The patient has "lumpy breasts" bilaterally and I find the physical exam not sensitive, but certainly there are no skin or nipple changes of concern in the right breast and I do not palpate a right axillary lymph node.  LAB RESULTS:  CMP     Component Value Date/Time   NA 141 08/24/2021 1205   K 4.0 08/24/2021 1205   CL 109 08/24/2021 1205   CO2 24 08/24/2021 1205   GLUCOSE 102 (H) 08/24/2021 1205   BUN 7 08/24/2021 1205   CREATININE 0.73 08/24/2021 1205   CALCIUM 8.7 (L) 08/24/2021 1205   PROT 6.5 08/24/2021 1205   ALBUMIN 3.9 08/24/2021 1205   AST 13 (L) 08/24/2021 1205   ALT 13 08/24/2021 1205   ALKPHOS 99 08/24/2021 1205   BILITOT 0.5 08/24/2021 1205   GFRNONAA >60 08/24/2021 1205   GFRAA >90 11/26/2011 2008    No results found for: TOTALPROTELP, ALBUMINELP, A1GS, A2GS, BETS, BETA2SER, GAMS, MSPIKE, SPEI  Lab Results  Component Value Date   WBC 11.2 (H) 08/24/2021   NEUTROABS 9.5 (H) 08/24/2021   HGB 10.2 (L) 08/24/2021   HCT 30.6 (L) 08/24/2021   MCV 94.2 08/24/2021   PLT 299 08/24/2021    No results found for: LABCA2  No components found for: FXTKWI097  No results for input(s): INR in the last 168 hours.  No results found for: LABCA2  No results found for: DZH299  No results found for: MEQ683  No results  found for: MHD622  No results found for: CA2729  No components found for: HGQUANT  No results found for: CEA1 / No results found for: CEA1   No results found for: AFPTUMOR  No results found for: CHROMOGRNA  No results found for: KPAFRELGTCHN, LAMBDASER, KAPLAMBRATIO (kappa/lambda light chains)  No results found for: HGBA, HGBA2QUANT, HGBFQUANT, HGBSQUAN (Hemoglobinopathy evaluation)   Lab Results  Component Value Date   LDH 223 11/26/2011    No results found for: IRON, TIBC, IRONPCTSAT (Iron and TIBC)  No results found for: FERRITIN  Urinalysis    Component Value Date/Time   COLORURINE YELLOW 08/14/2021 0940   APPEARANCEUR HAZY (A) 08/14/2021 0940   LABSPEC 1.015 08/14/2021 0940   PHURINE 5.0 08/14/2021 0940   GLUCOSEU NEGATIVE 08/14/2021 0940   HGBUR NEGATIVE 08/14/2021 0940   BILIRUBINUR NEGATIVE 08/14/2021 0940   KETONESUR NEGATIVE 08/14/2021 0940   PROTEINUR NEGATIVE 08/14/2021 0940   UROBILINOGEN 0.2 11/26/2011 2000   NITRITE NEGATIVE 08/14/2021 0940   LEUKOCYTESUR NEGATIVE 08/14/2021 0940    STUDIES: DG Chest 2 View  Result Date: 08/14/2021 CLINICAL DATA:  Fever, cough. EXAM: CHEST - 2 VIEW COMPARISON:  June 30, 2021. FINDINGS: The heart size and mediastinal contours are within normal limits. Both lungs are clear. The visualized skeletal structures are unremarkable. IMPRESSION: No active cardiopulmonary disease. Electronically Signed   By: Marijo Conception M.D.   On: 08/14/2021 09:17   US BREAST LTD UNI RIGHT INC AXILLA  Addendum Date: 08/12/2021   ADDENDUM REPORT:  08/12/2021 10:03 ADDENDUM: This is an error to the report originally dictated on 08/11/2021. Within the FINDINGS section, please note all evaluation and findings were within the RIGHT breast and RIGHT axilla. Electronically Signed   By: Kristopher Oppenheim M.D.   On: 08/12/2021 10:03   Result Date: 08/12/2021 CLINICAL DATA:  45 year old female with recently diagnosed right breast malignancy  presents for ultrasound evaluation after neoadjuvant chemotherapy. EXAM: ULTRASOUND OF THE RIGHT BREAST COMPARISON:  Previous exam(s). FINDINGS: Targeted ultrasound is performed, showing interval decrease in size of an irregular lobulated mass at the 12 o'clock position 2 cm from the nipple. An echogenic focus is consistent with a post biopsy clip. It now measures 1.6 x 1.3 x 0.9 cm (previously 3.0 x 2.7 x 1.6 cm. An adjacent satellite lesion with previous clip placement measures 4 x 3 x 2 mm (previously 5 x 5 x 2 mm). A third hypoechoic mass at the 10 o'clock position 5 cm from the nipple measures 4 x 4 x 2 mm (previously 5 x 5 x 3 mm). Please note, this lesion has not been biopsied or localized with a clip. Evaluation of the left axilla demonstrates the previously biopsied lymph node with associated post biopsy clip, measuring 3 mm in cortical thickness (previously 5 mm). IMPRESSION: 1. Interval decrease in size of the previously biopsied masses and lymph node consistent with response to chemotherapy. 2. Stable appearance of a hypoechoic mass at the 10 o'clock position 5 cm from the nipple incidentally identified at the time of the patient's ultrasound-guided biopsy. Of note, this mass has not been biopsied or localized. RECOMMENDATION: Per clinical treatment plan. I have discussed the findings and recommendations with the patient. If applicable, a reminder letter will be sent to the patient regarding the next appointment. BI-RADS CATEGORY  6: Known biopsy-proven malignancy. Electronically Signed: By: Kristopher Oppenheim M.D. On: 08/11/2021 16:29    ELIGIBLE FOR AVAILABLE RESEARCH PROTOCOL: no  ASSESSMENT: 45 y.o. [place] woman status post right breast upper outer quadrant biopsy 06/04/2021 for a clinical mT2 N0 invasive ductal carcinoma, grade 3, estrogen and progesterone receptor weakly positive, HER2 not amplified, with an MIB-1 of 70%  (A) chest CT with contrast 06/22/2021 shows right breast mass, no other  lesions of concern; pulmonary nodules all less than 5 mm noted, likely benign  (B) bone scan 06/22/2021 negative for metastases; consistent with sacroiliitis  (1) genetics testing: (A) [facility] BRCAplus dated 06/16/2021 finds no deleterious mutations in ATM, BRCA1, BRCA2, CDH1, CHEK2, PALB2, PTEN, and TP53 (B) The [facility]-Expanded gene panel obtained 06/19/2021 showed no deleterious mutations in AIP, ALK, APC, ATM, AXIN2, BAP1, BARD1, BLM, BMPR1A, BRCA1, BRCA2, BRIP1, CDC73, CDH1, CDK4, CDKN1B, CDKN2A, CHEK2, CTNNA1, DICER1, FANCC, FH, FLCN, GALNT12, KIF1B, LZTR1, MAX, MEN1, MET, MLH1, MSH2, MSH3, MSH6, MUTYH, NBN, NF1, NF2, NTHL1, PALB2, PHOX2B, PMS2, POT1, PRKAR1A, PTCH1, PTEN, RAD51C, RAD51D, RB1, RECQL, RET, SDHA, SDHAF2, SDHB, SDHC, SDHD, SMAD4, SMARCA4, SMARCB1, SMARCE1, STK11, SUFU, TMEM127, TP53, TSC1, TSC2, VHL and XRCC2 (sequencing and deletion/duplication); EGFR, EGLN1, HOXB13, KIT, MITF, PDGFRA, POLD1, and POLE (sequencing only); EPCAM and GREM1 (deletion/duplication only). (C)  a variant of uncertain significance was identified in the PTCH1 gene  (2) neoadjuvant chemotherapy consisting of doxorubicin and cyclophosphamide in dose dense fashion x4 started 0/13/2022, completed 08/10/2021, to be followed by weekly carboplatin and paclitaxel x12  (3) definitive surgery to follow  (4) adjuvant radiation as appropriate  (5) antiestrogens   PLAN: Suhaila as is certainly over the flu although she is still recovering  from it.  She has a mild cough and a little nasal drainage.  She has lost some weight.  She is able to eat and drink well and is not having any other active symptoms.  Despite that I suggested we postpone her first treatment for a week since I do not think that would affect the long-term results.  She is very motivated to get going and really wanted to start today.  I think that is possible.  I added 500 cc of normal saline to the treatment and we will check on her in the next couple  of days to make sure she continues to hydrate herself well.  In preparation for today's treatment we discussed possible side effects toxicities and complications again with particular attention to her first dose reaction to Taxol and 2 issues regarding peripheral neuropathy.  We also discussed the addition of carboplatin which I think is prudent given the fact that her tumor is very weakly estrogen and progesterone receptor positive and may indeed proved to be truly triple negative or nearly so when we obtain the final pathology.  She will meet my partner Dr. Lindi Adie later this week.  He will be taking over her case after my retirement.  I will see her again a week from today and I have asked her to keep a symptom diary prior to that visit so we can troubleshoot any problems with the subsequent treatments.  Total encounter time 25 minutes.Sarajane Jews C. Kevron Patella, MD 08/24/21 5:53 PM Medical Oncology and Hematology [facility] [place], [place] [address] Tel. [phone]    Fax. [phone]   I, Wilburn Mylar, am acting as scribe for Dr. Virgie Dad. Autumn Pruitt.  I, Lurline Del MD, have reviewed the above documentation for accuracy and completeness, and I agree with the above.   *Total Encounter Time as defined by the Centers for Medicare and Medicaid Services includes, in addition to the face-to-face time of a patient visit (documented in the note above) non-face-to-face time: obtaining and reviewing outside history, ordering and reviewing medications, tests or procedures, care coordination (communications with other health care professionals or caregivers) and documentation in the medical record.

## 2021-08-24 ENCOUNTER — Other Ambulatory Visit: Payer: Self-pay | Admitting: Oncology

## 2021-08-24 ENCOUNTER — Inpatient Hospital Stay: Payer: No Typology Code available for payment source

## 2021-08-24 ENCOUNTER — Ambulatory Visit: Payer: No Typology Code available for payment source | Admitting: Oncology

## 2021-08-24 ENCOUNTER — Inpatient Hospital Stay (HOSPITAL_BASED_OUTPATIENT_CLINIC_OR_DEPARTMENT_OTHER): Payer: No Typology Code available for payment source | Admitting: Oncology

## 2021-08-24 ENCOUNTER — Encounter: Payer: No Typology Code available for payment source | Admitting: Nutrition

## 2021-08-24 ENCOUNTER — Other Ambulatory Visit: Payer: Self-pay | Admitting: Hematology and Oncology

## 2021-08-24 ENCOUNTER — Other Ambulatory Visit: Payer: No Typology Code available for payment source

## 2021-08-24 ENCOUNTER — Telehealth: Payer: Self-pay | Admitting: *Deleted

## 2021-08-24 ENCOUNTER — Other Ambulatory Visit: Payer: Self-pay

## 2021-08-24 VITALS — BP 115/77 | HR 86 | Temp 98.4°F | Resp 16

## 2021-08-24 VITALS — BP 118/81 | HR 100 | Temp 97.7°F | Wt 118.8 lb

## 2021-08-24 DIAGNOSIS — C50411 Malignant neoplasm of upper-outer quadrant of right female breast: Secondary | ICD-10-CM

## 2021-08-24 DIAGNOSIS — Z170 Estrogen receptor positive status [ER+]: Secondary | ICD-10-CM

## 2021-08-24 LAB — CBC WITH DIFFERENTIAL (CANCER CENTER ONLY)
Abs Immature Granulocytes: 0.1 10*3/uL — ABNORMAL HIGH (ref 0.00–0.07)
Basophils Absolute: 0.1 10*3/uL (ref 0.0–0.1)
Basophils Relative: 1 %
Eosinophils Absolute: 0 10*3/uL (ref 0.0–0.5)
Eosinophils Relative: 0 %
HCT: 30.6 % — ABNORMAL LOW (ref 36.0–46.0)
Hemoglobin: 10.2 g/dL — ABNORMAL LOW (ref 12.0–15.0)
Immature Granulocytes: 1 %
Lymphocytes Relative: 8 %
Lymphs Abs: 0.9 10*3/uL (ref 0.7–4.0)
MCH: 31.4 pg (ref 26.0–34.0)
MCHC: 33.3 g/dL (ref 30.0–36.0)
MCV: 94.2 fL (ref 80.0–100.0)
Monocytes Absolute: 0.6 10*3/uL (ref 0.1–1.0)
Monocytes Relative: 5 %
Neutro Abs: 9.5 10*3/uL — ABNORMAL HIGH (ref 1.7–7.7)
Neutrophils Relative %: 85 %
Platelet Count: 299 10*3/uL (ref 150–400)
RBC: 3.25 MIL/uL — ABNORMAL LOW (ref 3.87–5.11)
RDW: 15.9 % — ABNORMAL HIGH (ref 11.5–15.5)
WBC Count: 11.2 10*3/uL — ABNORMAL HIGH (ref 4.0–10.5)
nRBC: 0 % (ref 0.0–0.2)

## 2021-08-24 LAB — CMP (CANCER CENTER ONLY)
ALT: 13 U/L (ref 0–44)
AST: 13 U/L — ABNORMAL LOW (ref 15–41)
Albumin: 3.9 g/dL (ref 3.5–5.0)
Alkaline Phosphatase: 99 U/L (ref 38–126)
Anion gap: 8 (ref 5–15)
BUN: 7 mg/dL (ref 6–20)
CO2: 24 mmol/L (ref 22–32)
Calcium: 8.7 mg/dL — ABNORMAL LOW (ref 8.9–10.3)
Chloride: 109 mmol/L (ref 98–111)
Creatinine: 0.73 mg/dL (ref 0.44–1.00)
GFR, Estimated: >60 mL/min (ref >60)
Glucose, Bld: 102 mg/dL — ABNORMAL HIGH (ref 70–99)
Potassium: 4 mmol/L (ref 3.5–5.1)
Sodium: 141 mmol/L (ref 135–145)
Total Bilirubin: 0.5 mg/dL (ref 0.3–1.2)
Total Protein: 6.5 g/dL (ref 6.5–8.1)

## 2021-08-24 LAB — PREGNANCY, URINE: Preg Test, Ur: NEGATIVE

## 2021-08-24 MED ORDER — PALONOSETRON HCL INJECTION 0.25 MG/5ML
0.2500 mg | Freq: Once | INTRAVENOUS | Status: AC
  Administered 2021-08-24: 0.25 mg via INTRAVENOUS
  Filled 2021-08-24: qty 5

## 2021-08-24 MED ORDER — SODIUM CHLORIDE 0.9 % IV SOLN
80.0000 mg/m2 | Freq: Once | INTRAVENOUS | Status: AC
  Administered 2021-08-24: 132 mg via INTRAVENOUS
  Filled 2021-08-24: qty 22

## 2021-08-24 MED ORDER — SODIUM CHLORIDE 0.9 % IV SOLN
210.6000 mg | Freq: Once | INTRAVENOUS | Status: AC
  Administered 2021-08-24: 210 mg via INTRAVENOUS
  Filled 2021-08-24: qty 21

## 2021-08-24 MED ORDER — DIPHENHYDRAMINE HCL 50 MG/ML IJ SOLN
25.0000 mg | Freq: Once | INTRAMUSCULAR | Status: AC
  Administered 2021-08-24: 25 mg via INTRAVENOUS
  Filled 2021-08-24: qty 1

## 2021-08-24 MED ORDER — SODIUM CHLORIDE 0.9% FLUSH
10.0000 mL | INTRAVENOUS | Status: DC | PRN
  Administered 2021-08-24: 10 mL

## 2021-08-24 MED ORDER — PROCTOFOAM HC 1-1 % EX FOAM: 1.0000 | Freq: Two times a day (BID) | CUTANEOUS | 1 refills | Status: DC

## 2021-08-24 MED ORDER — SODIUM CHLORIDE 0.9% FLUSH: 500.0000 mL | Freq: Once | INTRAVENOUS | Status: DC

## 2021-08-24 MED ORDER — FAMOTIDINE 20 MG IN NS 100 ML IVPB
20.0000 mg | Freq: Once | INTRAVENOUS | Status: AC
  Administered 2021-08-24: 20 mg via INTRAVENOUS
  Filled 2021-08-24: qty 100

## 2021-08-24 MED ORDER — SODIUM CHLORIDE 0.9 % IV SOLN: Freq: Once | INTRAVENOUS | Status: AC

## 2021-08-24 MED ORDER — SODIUM CHLORIDE 0.9 % IV SOLN
10.0000 mg | Freq: Once | INTRAVENOUS | Status: AC
  Administered 2021-08-24: 10 mg via INTRAVENOUS
  Filled 2021-08-24: qty 10

## 2021-08-24 MED ORDER — HEPARIN SOD (PORK) LOCK FLUSH 100 UNIT/ML IV SOLN
500.0000 [IU] | Freq: Once | INTRAVENOUS | Status: AC | PRN
  Administered 2021-08-24: 500 [IU]

## 2021-08-24 NOTE — Telephone Encounter (Signed)
Per MD pt needs extra 500 cc NS with treatment today and may run concurrent with chemo.

## 2021-08-24 NOTE — Patient Instructions (Signed)

## 2021-08-24 NOTE — Patient Instructions (Addendum)
[facility]  Discharge Instructions: Thank you for choosing [facility] to provide your oncology and hematology care.   If you have a lab appointment with the Cancer Center, please go directly to the Cancer Center and check in at the registration area.   Wear comfortable clothing and clothing appropriate for easy access to any Portacath or PICC line.   We strive to give you quality time with your provider. You may need to reschedule your appointment if you arrive late (15 or more minutes).  Arriving late affects you and other patients whose appointments are after yours.  Also, if you miss three or more appointments without notifying the office, you may be dismissed from the clinic at the provider's discretion.      For prescription refill requests, have your pharmacy contact our office and allow 72 hours for refills to be completed.    Today you received the following chemotherapy and/or immunotherapy agents Taxol/Carboplatin       To help prevent nausea and vomiting after your treatment, we encourage you to take your nausea medication as directed.  BELOW ARE SYMPTOMS THAT SHOULD BE REPORTED IMMEDIATELY: *FEVER GREATER THAN 100.4 F (38 C) OR HIGHER *CHILLS OR SWEATING *NAUSEA AND VOMITING THAT IS NOT CONTROLLED WITH YOUR NAUSEA MEDICATION *UNUSUAL SHORTNESS OF BREATH *UNUSUAL BRUISING OR BLEEDING *URINARY PROBLEMS (pain or burning when urinating, or frequent urination) *BOWEL PROBLEMS (unusual diarrhea, constipation, pain near the anus) TENDERNESS IN MOUTH AND THROAT WITH OR WITHOUT PRESENCE OF ULCERS (sore throat, sores in mouth, or a toothache) UNUSUAL RASH, SWELLING OR PAIN  UNUSUAL VAGINAL DISCHARGE OR ITCHING   Items with * indicate a potential emergency and should be followed up as soon as possible or go to the Emergency Department if any problems should occur.  Please show the CHEMOTHERAPY ALERT CARD or IMMUNOTHERAPY ALERT CARD at  check-in to the Emergency Department and triage nurse.  Should you have questions after your visit or need to cancel or reschedule your appointment, please contact [facility]  Dept: [phone]  and follow the prompts.  Office hours are 8:00 a.m. to 4:30 p.m. Monday - Friday. Please note that voicemails left after 4:00 p.m. may not be returned until the following business day.  We are closed weekends and major holidays. You have access to a nurse at all times for urgent questions. Please call the main number to the clinic Dept: [phone] and follow the prompts.   For any non-urgent questions, you may also contact your provider using MyChart. We now offer e-Visits for anyone 18 and older to request care online for non-urgent symptoms. For details visit [email or web address].   Also download the MyChart app! Go to the app store, search "MyChart", open the app, select [facility], and log in with your MyChart username and password.  Due to Covid, a mask is required upon entering the hospital/clinic. If you do not have a mask, one will be given to you upon arrival. For doctor visits, patients may have 1 support person aged 18 or older with them. For treatment visits, patients cannot have anyone with them due to current Covid guidelines and our immunocompromised population.   Paclitaxel injection What is this medication? PACLITAXEL (PAK li TAX el) is a chemotherapy drug. It targets fast dividing cells, like cancer cells, and causes these cells to die. This medicine is used to treat ovarian cancer, breast cancer, lung cancer, Kaposi's sarcoma, and other cancers. This medicine   may be used for other purposes; ask your health care provider or pharmacist if you have questions. COMMON BRAND NAME(S): Onxol, Taxol What should I tell my care team before I take this medication? They need to know if you have any of these conditions: history of irregular heartbeat liver  disease low blood counts, like low white cell, platelet, or red cell counts lung or breathing disease, like asthma tingling of the fingers or toes, or other nerve disorder an unusual or allergic reaction to paclitaxel, alcohol, polyoxyethylated castor oil, other chemotherapy, other medicines, foods, dyes, or preservatives pregnant or trying to get pregnant breast-feeding How should I use this medication? This drug is given as an infusion into a vein. It is administered in a hospital or clinic by a specially trained health care professional. Talk to your pediatrician regarding the use of this medicine in children. Special care may be needed. Overdosage: If you think you have taken too much of this medicine contact a poison control center or emergency room at once. NOTE: This medicine is only for you. Do not share this medicine with others. What if I miss a dose? It is important not to miss your dose. Call your doctor or health care professional if you are unable to keep an appointment. What may interact with this medication? Do not take this medicine with any of the following medications: live virus vaccines This medicine may also interact with the following medications: antiviral medicines for hepatitis, HIV or AIDS certain antibiotics like erythromycin and clarithromycin certain medicines for fungal infections like ketoconazole and itraconazole certain medicines for seizures like carbamazepine, phenobarbital, phenytoin gemfibrozil nefazodone rifampin St. John's wort This list may not describe all possible interactions. Give your health care provider a list of all the medicines, herbs, non-prescription drugs, or dietary supplements you use. Also tell them if you smoke, drink alcohol, or use illegal drugs. Some items may interact with your medicine. What should I watch for while using this medication? Your condition will be monitored carefully while you are receiving this medicine. You  will need important blood work done while you are taking this medicine. This medicine can cause serious allergic reactions. To reduce your risk you will need to take other medicine(s) before treatment with this medicine. If you experience allergic reactions like skin rash, itching or hives, swelling of the face, lips, or tongue, tell your doctor or health care professional right away. In some cases, you may be given additional medicines to help with side effects. Follow all directions for their use. This drug may make you feel generally unwell. This is not uncommon, as chemotherapy can affect healthy cells as well as cancer cells. Report any side effects. Continue your course of treatment even though you feel ill unless your doctor tells you to stop. Call your doctor or health care professional for advice if you get a fever, chills or sore throat, or other symptoms of a cold or flu. Do not treat yourself. This drug decreases your body's ability to fight infections. Try to avoid being around people who are sick. This medicine may increase your risk to bruise or bleed. Call your doctor or health care professional if you notice any unusual bleeding. Be careful brushing and flossing your teeth or using a toothpick because you may get an infection or bleed more easily. If you have any dental work done, tell your dentist you are receiving this medicine. Avoid taking products that contain aspirin, acetaminophen, ibuprofen, naproxen, or ketoprofen unless instructed by   your doctor. These medicines may hide a fever. Do not become pregnant while taking this medicine. Women should inform their doctor if they wish to become pregnant or think they might be pregnant. There is a potential for serious side effects to an unborn child. Talk to your health care professional or pharmacist for more information. Do not breast-feed an infant while taking this medicine. Men are advised not to father a child while receiving this  medicine. This product may contain alcohol. Ask your pharmacist or healthcare provider if this medicine contains alcohol. Be sure to tell all healthcare providers you are taking this medicine. Certain medicines, like metronidazole and disulfiram, can cause an unpleasant reaction when taken with alcohol. The reaction includes flushing, headache, nausea, vomiting, sweating, and increased thirst. The reaction can last from 30 minutes to several hours. What side effects may I notice from receiving this medication? Side effects that you should report to your doctor or health care professional as soon as possible: allergic reactions like skin rash, itching or hives, swelling of the face, lips, or tongue breathing problems changes in vision fast, irregular heartbeat high or low blood pressure mouth sores pain, tingling, numbness in the hands or feet signs of decreased platelets or bleeding - bruising, pinpoint red spots on the skin, black, tarry stools, blood in the urine signs of decreased red blood cells - unusually weak or tired, feeling faint or lightheaded, falls signs of infection - fever or chills, cough, sore throat, pain or difficulty passing urine signs and symptoms of liver injury like dark yellow or brown urine; general ill feeling or flu-like symptoms; light-colored stools; loss of appetite; nausea; right upper belly pain; unusually weak or tired; yellowing of the eyes or skin swelling of the ankles, feet, hands unusually slow heartbeat Side effects that usually do not require medical attention (report to your doctor or health care professional if they continue or are bothersome): diarrhea hair loss loss of appetite muscle or joint pain nausea, vomiting pain, redness, or irritation at site where injected tiredness This list may not describe all possible side effects. Call your doctor for medical advice about side effects. You may report side effects to FDA at 1-800-FDA-1088. Where  should I keep my medication? This drug is given in a hospital or clinic and will not be stored at home. NOTE: This sheet is a summary. It may not cover all possible information. If you have questions about this medicine, talk to your doctor, pharmacist, or health care provider.  2022 Elsevier/Gold Standard (2021-05-19 00:00:00)  Carboplatin injection What is this medication? CARBOPLATIN (KAR boe pla tin) is a chemotherapy drug. It targets fast dividing cells, like cancer cells, and causes these cells to die. This medicine is used to treat ovarian cancer and many other cancers. This medicine may be used for other purposes; ask your health care provider or pharmacist if you have questions. COMMON BRAND NAME(S): Paraplatin What should I tell my care team before I take this medication? They need to know if you have any of these conditions: blood disorders hearing problems kidney disease recent or ongoing radiation therapy an unusual or allergic reaction to carboplatin, cisplatin, other chemotherapy, other medicines, foods, dyes, or preservatives pregnant or trying to get pregnant breast-feeding How should I use this medication? This drug is usually given as an infusion into a vein. It is administered in a hospital or clinic by a specially trained health care professional. Talk to your pediatrician regarding the use of this medicine in   children. Special care may be needed. Overdosage: If you think you have taken too much of this medicine contact a poison control center or emergency room at once. NOTE: This medicine is only for you. Do not share this medicine with others. What if I miss a dose? It is important not to miss a dose. Call your doctor or health care professional if you are unable to keep an appointment. What may interact with this medication? medicines for seizures medicines to increase blood counts like filgrastim, pegfilgrastim, sargramostim some antibiotics like amikacin,  gentamicin, neomycin, streptomycin, tobramycin vaccines Talk to your doctor or health care professional before taking any of these medicines: acetaminophen aspirin ibuprofen ketoprofen naproxen This list may not describe all possible interactions. Give your health care provider a list of all the medicines, herbs, non-prescription drugs, or dietary supplements you use. Also tell them if you smoke, drink alcohol, or use illegal drugs. Some items may interact with your medicine. What should I watch for while using this medication? Your condition will be monitored carefully while you are receiving this medicine. You will need important blood work done while you are taking this medicine. This drug may make you feel generally unwell. This is not uncommon, as chemotherapy can affect healthy cells as well as cancer cells. Report any side effects. Continue your course of treatment even though you feel ill unless your doctor tells you to stop. In some cases, you may be given additional medicines to help with side effects. Follow all directions for their use. Call your doctor or health care professional for advice if you get a fever, chills or sore throat, or other symptoms of a cold or flu. Do not treat yourself. This drug decreases your body's ability to fight infections. Try to avoid being around people who are sick. This medicine may increase your risk to bruise or bleed. Call your doctor or health care professional if you notice any unusual bleeding. Be careful brushing and flossing your teeth or using a toothpick because you may get an infection or bleed more easily. If you have any dental work done, tell your dentist you are receiving this medicine. Avoid taking products that contain aspirin, acetaminophen, ibuprofen, naproxen, or ketoprofen unless instructed by your doctor. These medicines may hide a fever. Do not become pregnant while taking this medicine. Women should inform their doctor if they wish  to become pregnant or think they might be pregnant. There is a potential for serious side effects to an unborn child. Talk to your health care professional or pharmacist for more information. Do not breast-feed an infant while taking this medicine. What side effects may I notice from receiving this medication? Side effects that you should report to your doctor or health care professional as soon as possible: allergic reactions like skin rash, itching or hives, swelling of the face, lips, or tongue signs of infection - fever or chills, cough, sore throat, pain or difficulty passing urine signs of decreased platelets or bleeding - bruising, pinpoint red spots on the skin, black, tarry stools, nosebleeds signs of decreased red blood cells - unusually weak or tired, fainting spells, lightheadedness breathing problems changes in hearing changes in vision chest pain high blood pressure low blood counts - This drug may decrease the number of white blood cells, red blood cells and platelets. You may be at increased risk for infections and bleeding. nausea and vomiting pain, swelling, redness or irritation at the injection site pain, tingling, numbness in the hands or feet   problems with balance, talking, walking trouble passing urine or change in the amount of urine Side effects that usually do not require medical attention (report to your doctor or health care professional if they continue or are bothersome): hair loss loss of appetite metallic taste in the mouth or changes in taste This list may not describe all possible side effects. Call your doctor for medical advice about side effects. You may report side effects to FDA at 1-800-FDA-1088. Where should I keep my medication? This drug is given in a hospital or clinic and will not be stored at home. NOTE: This sheet is a summary. It may not cover all possible information. If you have questions about this medicine, talk to your doctor, pharmacist,  or health care provider.  2022 [employer]vier/Gold Standard (2008-02-07 00:00:00)  

## 2021-08-26 NOTE — Assessment & Plan Note (Signed)
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 Neo adj chemo with AC X 4 followed by Taxol and Carboplatin weekly X 12 2. Breast Conserving surgery with SLN surgery 3. Adjuvant XRT 4. Adj Anti estrogen therapy ------------------------------------------------------------------------------------------------------------------- Current Treatment: Completed 4 cycles of AC, today is ycle 2 Taxol with Carboplatin Chemo Toxicities:   RTC weekly for taxol and every other week for follow up with me

## 2021-08-26 NOTE — Progress Notes (Signed)
° °Patient Care Team: °Swayne, David, MD as PCP - General (Family Medicine) °Blackman, Douglas, MD as Consulting Physician (General Surgery) °Magrinat, Gustav C, MD as Consulting Physician (Oncology) °Squire, Sarah, MD as Attending Physician (Radiation Oncology) °Martini, Keisha N, RN as Oncology Nurse Navigator °Stuart, Dawn C, RN as Oncology Nurse Navigator °Newman, Christopher E, MD as Consulting Physician (Otolaryngology) °Pinn, Walda, MD as Referring Physician (Obstetrics and Gynecology) °Pace, Collier S, MD as Consulting Physician (Plastic Surgery) ° °DIAGNOSIS:  °  ICD-10-CM   °1. Malignant neoplasm of upper-outer quadrant of right breast in female, estrogen receptor positive (HCC)  C50.411   ° Z17.0   °  ° ° °SUMMARY OF ONCOLOGIC HISTORY: °Oncology History  °Malignant neoplasm of upper-outer quadrant of right breast in female, estrogen receptor positive (HCC)  °06/04/2021 Initial Diagnosis  ° status post right breast upper outer quadrant biopsy 06/04/2021 for a clinical mT2 N0 invasive ductal carcinoma, grade 3, estrogen and progesterone receptor inadequate (functionally triple negative with HER2 not amplified), with an MIB-1 of 70% °            (A) chest CT with contrast 06/22/2021 shows right breast mass, no other lesions of concern; pulmonary nodules all less than 5 mm noted, likely benign °            (B) bone scan 06/22/2021 negative for metastases; consistent with sacroiliitis °  °06/10/2021 Cancer Staging  ° Staging form: Breast, AJCC 8th Edition °- Clinical stage from 06/10/2021: Stage IIA (cT2, cN0, cM0, G3, ER+, PR+, HER2-) - Signed by Magrinat, Gustav C, MD on 06/10/2021 °Stage prefix: Initial diagnosis °Histologic grading system: 3 grade system °Laterality: Right °Staged by: Pathologist and managing physician °Stage used in treatment planning: Yes °National guidelines used in treatment planning: Yes °Type of national guideline used in treatment planning: NCCN ° °  °06/16/2021 Genetic Testing  ° [facility]  CancerNext-Expanded was negative. No pathogenic variants were identified. 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 Report date is 06/19/2021. ° °The CancerNext-Expanded gene panel offered by [facility] and includes sequencing, rearrangement, and RNA analysis for the following 77 genes: AIP, ALK, APC, ATM, AXIN2, BAP1, BARD1, BLM, BMPR1A, BRCA1, BRCA2, BRIP1, CDC73, CDH1, CDK4, CDKN1B, CDKN2A, CHEK2, CTNNA1, DICER1, FANCC, FH, FLCN, GALNT12, KIF1B, LZTR1, MAX, MEN1, MET, MLH1, MSH2, MSH3, MSH6, MUTYH, NBN, NF1, NF2, NTHL1, PALB2, PHOX2B, PMS2, POT1, PRKAR1A, PTCH1, PTEN, RAD51C, RAD51D, RB1, RECQL, RET, SDHA, SDHAF2, SDHB, SDHC, SDHD, SMAD4, SMARCA4, SMARCB1, SMARCE1, STK11, SUFU, TMEM127, TP53, TSC1, TSC2, VHL and XRCC2 (sequencing and deletion/duplication); EGFR, EGLN1, HOXB13, KIT, MITF, PDGFRA, POLD1, and POLE (sequencing only); EPCAM and GREM1 (deletion/duplication only).  °  °06/25/2021 -  Neo-Adjuvant Chemotherapy  ° neoadjuvant chemotherapy consisting of doxorubicin and cyclophosphamide in dose dense fashion x4 started 0/13/2022, completed 08/10/2021, to be followed by weekly carboplatin and paclitaxel x12 starting 08/24/2021 °  ° ° °CHIEF COMPLIANT: Neoadjuvant chemotherapy: Switching providers ° °INTERVAL HISTORY: Ebony Lamb is a 45 y.o. with above-mentioned history of weakly estrogen receptor positive breast cancer.  She is currently on neoadjuvant chemotherapy.  She completed dose dense Adriamycin and Cytoxan and is currently on Taxol and carboplatin.  Overall she has been tolerating Taxol and carboplatin fairly well.  She did not have any nausea or vomiting.  Did have fatigue.  Her major complaint is related to hemorrhoidal bleeding.  She gets very constipated from the antinausea medications. ° °ALLERGIES:  has No Known Allergies. ° °MEDICATIONS:  °Current Outpatient Medications  °  Medication Sig Dispense Refill  ° albuterol (VENTOLIN HFA) 108 (90 Base) MCG/ACT  inhaler Inhale 2 puffs into the lungs every 6 (six) hours as needed for wheezing or shortness of breath. 8 g 2  ° dexamethasone (DECADRON) 1 MG tablet Take 1 or 2 tablets twice daily as instructed (together with 4 mg dexamethasone tablet) 20 tablet 1  ° dexamethasone (DECADRON) 4 MG tablet Take 1 tablet (4 mg total) by mouth 2 (two) times daily. Take daily for 3 days after chemo. Take with food. 30 tablet 1  ° doxycycline (VIBRA-TABS) 100 MG tablet Take 1 tablet (100 mg total) by mouth 2 (two) times daily. 10 tablet 0  ° doxycycline (VIBRA-TABS) 100 MG tablet Take 1 tablet (100 mg total) by mouth 2 (two) times daily. 14 tablet 0  ° hydrocortisone-pramoxine (PROCTOFOAM HC) rectal foam Place 1 applicator rectally 2 (two) times daily. As needed. 20 g 1  ° lidocaine-prilocaine (EMLA) cream Apply to affected area once 30 g 3  ° loratadine (CLARITIN) 10 MG tablet Take 1 tablet (10 mg total) by mouth daily. Take day post chemo and then daily for 10 days with each treatment 30 tablet 3  ° LORazepam (ATIVAN) 0.5 MG tablet Take 1 tablet (0.5 mg total) by mouth at bedtime as needed (Nausea or vomiting). 30 tablet 0  ° ondansetron (ZOFRAN) 8 MG tablet Take 1 tablet (8 mg total) by mouth every 8 (eight) hours as needed for nausea or vomiting. May take starting day 3 of chemotherapy 20 tablet 0  ° oseltamivir (TAMIFLU) 75 MG capsule Take 1 capsule (75 mg total) by mouth 2 (two) times daily. 10 capsule 0  ° pramoxine-hydrocortisone cream Apply topically 3 (three) times daily. 57 g 0  ° prochlorperazine (COMPAZINE) 10 MG tablet Take 1 tablet (10 mg total) by mouth every 6 (six) hours as needed (Nausea or vomiting). 30 tablet 1  ° traMADol (ULTRAM) 50 MG tablet Take 1-2 tablets (50-100 mg total) by mouth every 6 (six) hours as needed. 20 tablet 0  ° °No current facility-administered medications for this visit.  ° °Facility-Administered Medications Ordered in Other Visits  °Medication Dose Route Frequency Provider Last Rate Last  Admin  ° influenza vac split quadrivalent PF (FLUARIX) 0.5 ML injection           ° influenza vac split quadrivalent PF (FLUARIX) injection 0.5 mL  0.5 mL Intramuscular Once Magrinat, Gustav C, MD      ° ° °PHYSICAL EXAMINATION: °ECOG PERFORMANCE STATUS: 1 - Symptomatic but completely ambulatory ° °Vitals:  ° 08/27/21 0826  °BP: 113/67  °Pulse: 95  °Resp: 18  °Temp: 97.8 °F (36.6 °C)  °SpO2: 100%  ° °Filed Weights  ° 08/27/21 0826  °Weight: 122 lb 9.6 oz (55.6 kg)  ° °  ° °LABORATORY DATA:  °I have reviewed the data as listed °CMP Latest Ref Rng & Units 08/24/2021 08/14/2021 08/10/2021  °Glucose 70 - 99 mg/dL 102(H) 103(H) 103(H)  °BUN 6 - 20 mg/dL 7 11 11  °Creatinine 0.44 - 1.00 mg/dL 0.73 0.64 0.67  °Sodium 135 - 145 mmol/L 141 136 141  °Potassium 3.5 - 5.1 mmol/L 4.0 3.6 3.8  °Chloride 98 - 111 mmol/L 109 105 109  °CO2 22 - 32 mmol/L 24 22 23  °Calcium 8.9 - 10.3 mg/dL 8.7(L) 8.1(L) 9.0  °Total Protein 6.5 - 8.1 g/dL 6.5 5.8(L) 6.5  °Total Bilirubin 0.3 - 1.2 mg/dL 0.5 0.3 0.4  °Alkaline Phos 38 - 126 U/L 99 145(H) 92  °  AST 15 - 41 U/L 13(L) 15 20  ALT 0 - 44 U/L _0 Lab Results  Component Value Date   WBC 11.2 (H) 08/24/2021   HGB 10.2 (L) 08/24/2021   HCT 30.6 (L) 08/24/2021   MCV 94.2 08/24/2021   PLT 299 08/24/2021   NEUTROABS 9.5 (H) 08/24/2021    ASSESSMENT & PLAN:  Malignant neoplasm of upper-outer quadrant of right breast in female, estrogen receptor positive (Many) right breast upper outer quadrant biopsy 06/04/2021 for a clinical mT2 N0 invasive ductal carcinoma, grade 3, estrogen and progesterone receptor weakly positive, HER2 not amplified, with an MIB-1 of 70% CT CAP and Bone Scan: Neg  Treatment Plan: 1. Neo adj chemo with AC X 4 followed by Taxol and Carboplatin weekly X 12 2. Breast Conserving surgery with SLN surgery 3. Adjuvant XRT 4. Adj Anti estrogen  therapy ------------------------------------------------------------------------------------------------------------------- Current Treatment: Completed 4 cycles of AC, today is ycle 2 Taxol with Carboplatin Chemo Toxicities: Fatigue Hemorrhoidal bleeding: I instructed her to take Colace 3 times a day and use the Proctofoam for symptomatic relief We decided to discontinue dexamethasone.  She will take antinausea medications on an as-needed basis Chemo induced anemia: Hemoglobin was 10.2 on 08/24/2021  She plans to take her flu vaccine and the COVID booster vaccine in the coming weeks.  RTC weekly for taxol and every other week for follow up with me    No orders of the defined types were placed in this encounter.  The patient has a good understanding of the overall plan. she agrees with it. she will call with any problems that may develop before the next visit here.  Total time spent: 45 mins including face to face time and time spent for planning, charting and coordination of care  Rulon Eisenmenger, MD, MPH 08/27/2021  I, Thana Ates, am acting as scribe for Dr. Nicholas Lose.  I have reviewed the above documentation for accuracy and completeness, and I agree with the above.

## 2021-08-27 ENCOUNTER — Other Ambulatory Visit: Payer: No Typology Code available for payment source

## 2021-08-27 ENCOUNTER — Ambulatory Visit: Payer: No Typology Code available for payment source

## 2021-08-27 ENCOUNTER — Other Ambulatory Visit: Payer: Self-pay

## 2021-08-27 ENCOUNTER — Ambulatory Visit: Payer: No Typology Code available for payment source | Admitting: Oncology

## 2021-08-27 ENCOUNTER — Telehealth: Payer: Self-pay | Admitting: *Deleted

## 2021-08-27 ENCOUNTER — Inpatient Hospital Stay (HOSPITAL_BASED_OUTPATIENT_CLINIC_OR_DEPARTMENT_OTHER): Payer: No Typology Code available for payment source | Admitting: Hematology and Oncology

## 2021-08-27 ENCOUNTER — Encounter: Payer: Self-pay | Admitting: Oncology

## 2021-08-27 DIAGNOSIS — C50411 Malignant neoplasm of upper-outer quadrant of right female breast: Secondary | ICD-10-CM

## 2021-08-27 DIAGNOSIS — Z170 Estrogen receptor positive status [ER+]: Secondary | ICD-10-CM | POA: Diagnosis not present

## 2021-08-27 MED ORDER — HYDROCORTISONE ACETATE 25 MG RE SUPP: 25.0000 mg | Freq: Two times a day (BID) | RECTAL | 2 refills | Status: DC

## 2021-08-27 NOTE — Telephone Encounter (Signed)
No entry 

## 2021-08-28 ENCOUNTER — Telehealth: Payer: Self-pay

## 2021-08-28 MED FILL — Dexamethasone Sodium Phosphate Inj 100 MG/10ML: INTRAMUSCULAR | Qty: 1 | Status: AC

## 2021-08-28 NOTE — Telephone Encounter (Signed)
Returned pt call, spoke with pt who reports rectal pain and bleeding from hemorrhoids.  Pt states she sees drops of blood in toilet but water is still clear, no blood staining clothing currently.  She has not yet started using her suppository rx but will try that and phone back in with updates.  I advised pt to call us to report excessive bleeding (murky water or soiled clothing), pt verbalized understanding.  I also advised pt to try hemorrhoid pillow for more comfortable sitting position, and maintain adequate hydration.  Pt verbalized understanding and thanks.

## 2021-08-30 ENCOUNTER — Other Ambulatory Visit: Payer: Self-pay

## 2021-08-30 ENCOUNTER — Emergency Department (HOSPITAL_COMMUNITY): Payer: No Typology Code available for payment source

## 2021-08-30 ENCOUNTER — Emergency Department (HOSPITAL_COMMUNITY)
Admission: EM | Admit: 2021-08-30 | Discharge: 2021-08-30 | Disposition: A | Discharge status: Home / Self Care | Payer: No Typology Code available for payment source | Attending: Emergency Medicine | Admitting: Emergency Medicine

## 2021-08-30 ENCOUNTER — Encounter (HOSPITAL_COMMUNITY): Payer: Self-pay | Admitting: Oncology

## 2021-08-30 DIAGNOSIS — I10 Essential (primary) hypertension: Secondary | ICD-10-CM | POA: Insufficient documentation

## 2021-08-30 DIAGNOSIS — K644 Residual hemorrhoidal skin tags: Secondary | ICD-10-CM | POA: Diagnosis not present

## 2021-08-30 DIAGNOSIS — Z853 Personal history of malignant neoplasm of breast: Secondary | ICD-10-CM | POA: Diagnosis not present

## 2021-08-30 DIAGNOSIS — Z79899 Other long term (current) drug therapy: Secondary | ICD-10-CM | POA: Insufficient documentation

## 2021-08-30 DIAGNOSIS — K0889 Other specified disorders of teeth and supporting structures: Secondary | ICD-10-CM | POA: Diagnosis present

## 2021-08-30 DIAGNOSIS — K6289 Other specified diseases of anus and rectum: Secondary | ICD-10-CM

## 2021-08-30 DIAGNOSIS — K5900 Constipation, unspecified: Secondary | ICD-10-CM | POA: Insufficient documentation

## 2021-08-30 LAB — CBC WITH DIFFERENTIAL/PLATELET
Abs Immature Granulocytes: 0.05 10*3/uL (ref 0.00–0.07)
Basophils Absolute: 0.1 10*3/uL (ref 0.0–0.1)
Basophils Relative: 1 %
Eosinophils Absolute: 0 10*3/uL (ref 0.0–0.5)
Eosinophils Relative: 0 %
HCT: 32.2 % — ABNORMAL LOW (ref 36.0–46.0)
Hemoglobin: 10.8 g/dL — ABNORMAL LOW (ref 12.0–15.0)
Immature Granulocytes: 1 %
Lymphocytes Relative: 15 %
Lymphs Abs: 0.9 10*3/uL (ref 0.7–4.0)
MCH: 32.2 pg (ref 26.0–34.0)
MCHC: 33.5 g/dL (ref 30.0–36.0)
MCV: 96.1 fL (ref 80.0–100.0)
Monocytes Absolute: 0.3 10*3/uL (ref 0.1–1.0)
Monocytes Relative: 4 %
Neutro Abs: 4.7 10*3/uL (ref 1.7–7.7)
Neutrophils Relative %: 79 %
Platelets: 320 10*3/uL (ref 150–400)
RBC: 3.35 MIL/uL — ABNORMAL LOW (ref 3.87–5.11)
RDW: 15.5 % (ref 11.5–15.5)
WBC: 6 10*3/uL (ref 4.0–10.5)
nRBC: 0 % (ref 0.0–0.2)

## 2021-08-30 LAB — BASIC METABOLIC PANEL
Anion gap: 7 (ref 5–15)
BUN: 14 mg/dL (ref 6–20)
CO2: 26 mmol/L (ref 22–32)
Calcium: 9 mg/dL (ref 8.9–10.3)
Chloride: 105 mmol/L (ref 98–111)
Creatinine, Ser: 0.64 mg/dL (ref 0.44–1.00)
GFR, Estimated: >60 mL/min (ref >60)
Glucose, Bld: 112 mg/dL — ABNORMAL HIGH (ref 70–99)
Potassium: 3.8 mmol/L (ref 3.5–5.1)
Sodium: 138 mmol/L (ref 135–145)

## 2021-08-30 IMAGING — CT CT PELVIS W/ CM
2 of 3 series · 16 of 46 positions shown, 18 images · IV contrast (omnipaque)
Comparison: None.

CLINICAL DATA: Proctitis or pouchitis suspected

EXAM:
CT PELVIS WITH CONTRAST
TECHNIQUE: Multidetector CT imaging of the pelvis was performed using the
standard protocol following the bolus administration of intravenous
contrast.
CONTRAST:  80mL OMNIPAQUE IOHEXOL 350 MG/ML SOLN

[Series 2: axial st · axial · 0.84mm/px · z∈[-519,-274]mm · 13 of 57 slices shown, 15 images]
[im 4/57  soft-tissue]
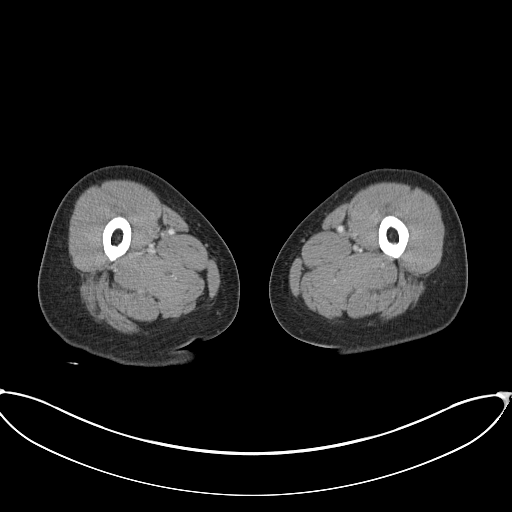
[im 4/57  bone]
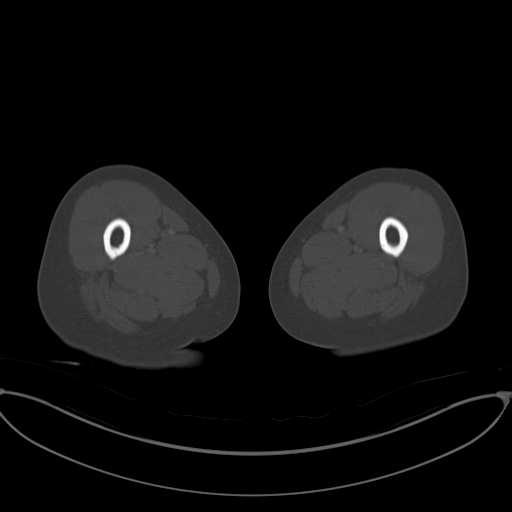
[im 8/57  soft-tissue]
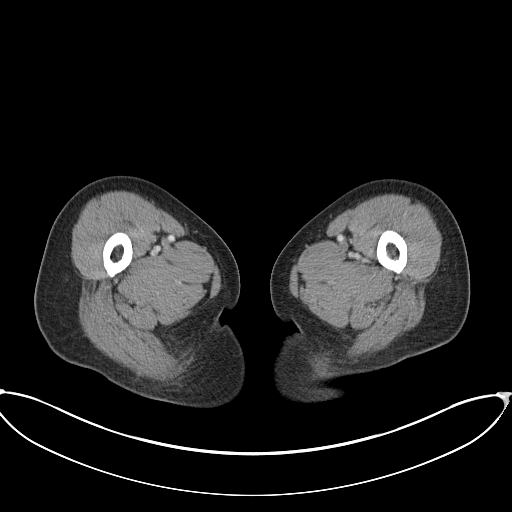
[im 11/57  soft-tissue]
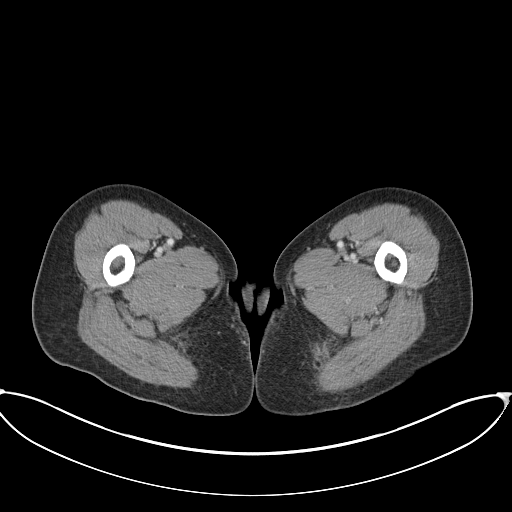
[im 17/57  soft-tissue]
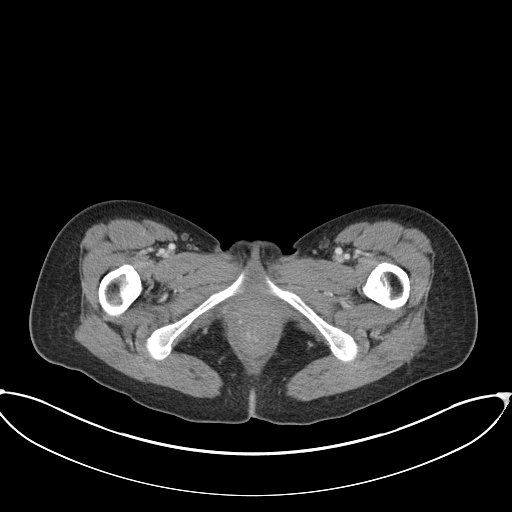
[im 20/57  soft-tissue]
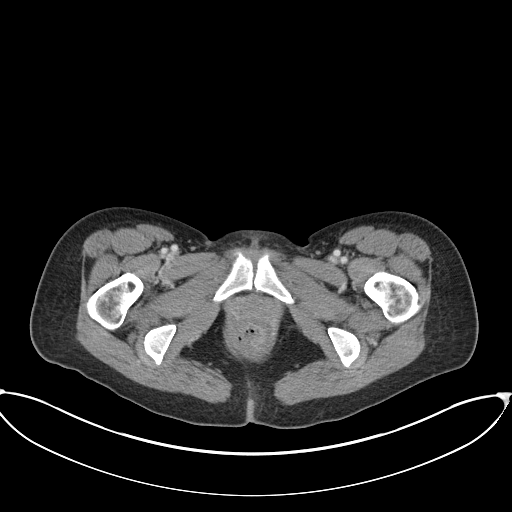
[im 24/57  soft-tissue]
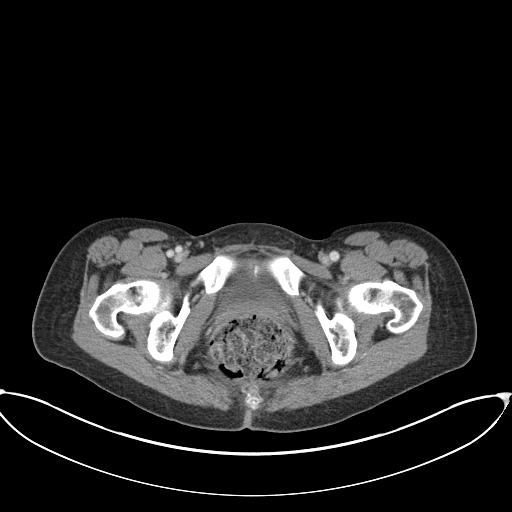
[im 29/57  soft-tissue]
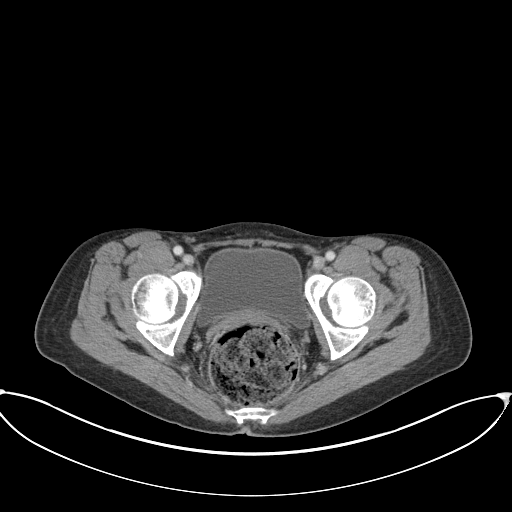
[im 33/57  soft-tissue]
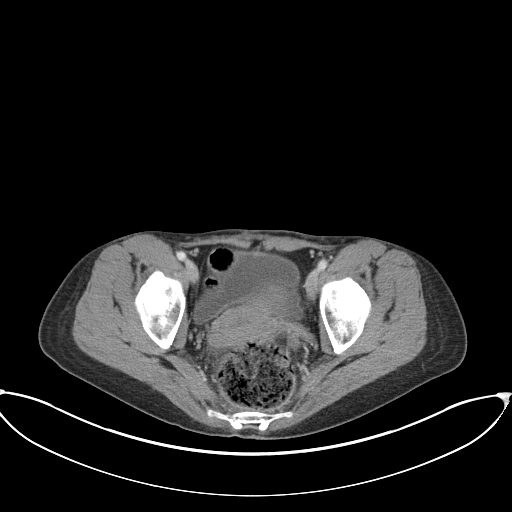
[im 37/57  soft-tissue]
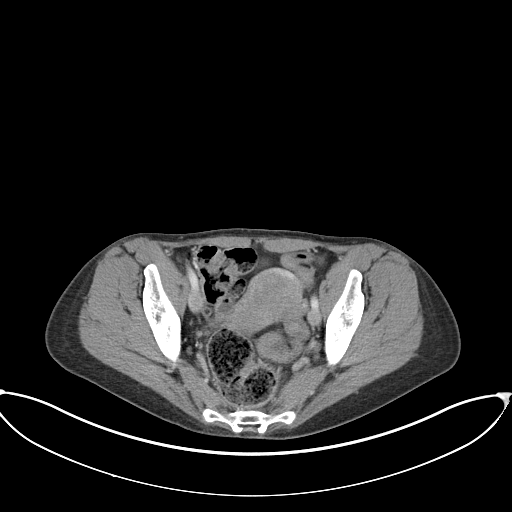
[im 37/57  bone]
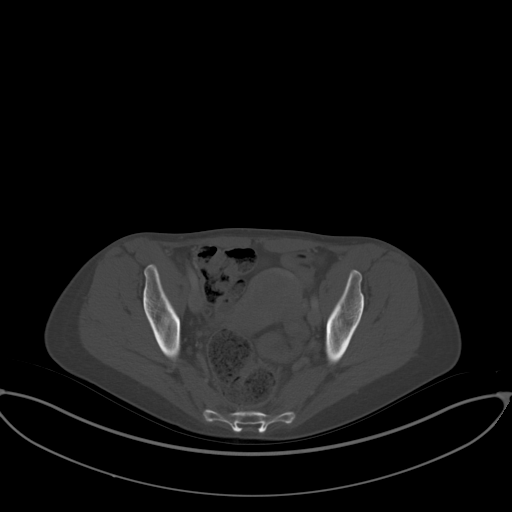
[im 40/57  soft-tissue]
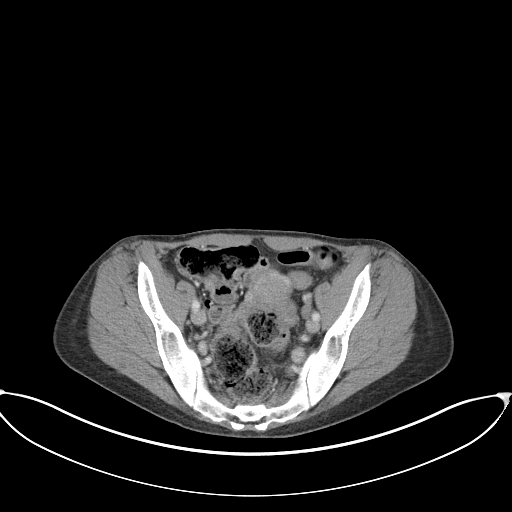
[im 46/57  soft-tissue]
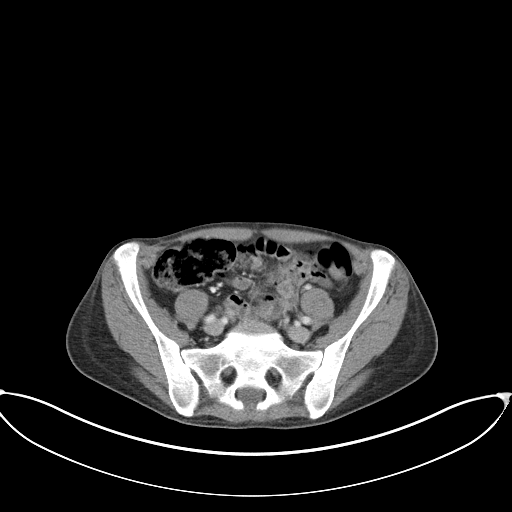
[im 49/57  soft-tissue]
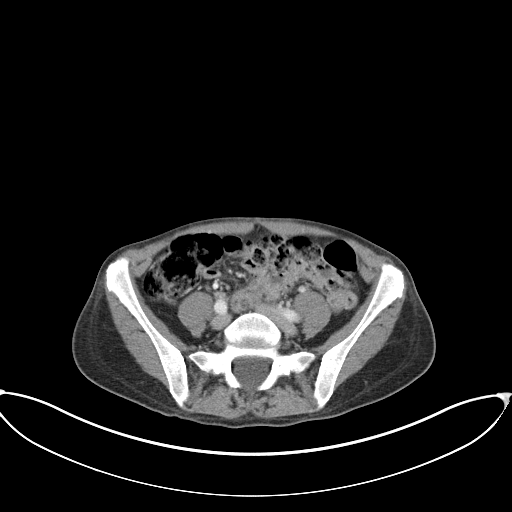
[im 53/57  soft-tissue]
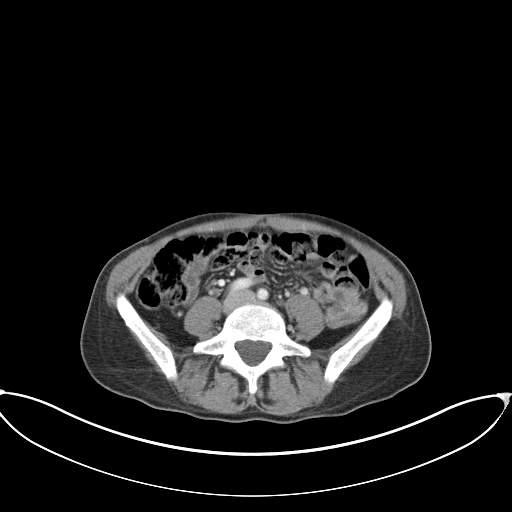

[Series 8: coronal st · coronal · 0.55mm/px · 3 of 110 slices shown]
[im 37/110  soft-tissue]
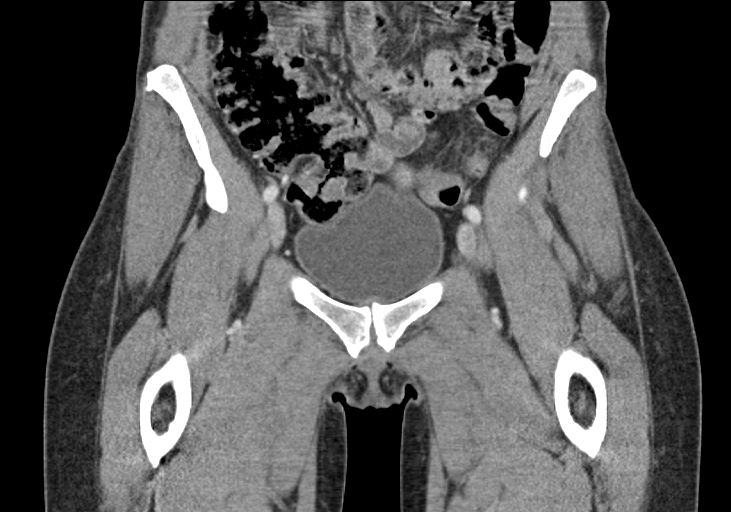
[im 49/110  soft-tissue]
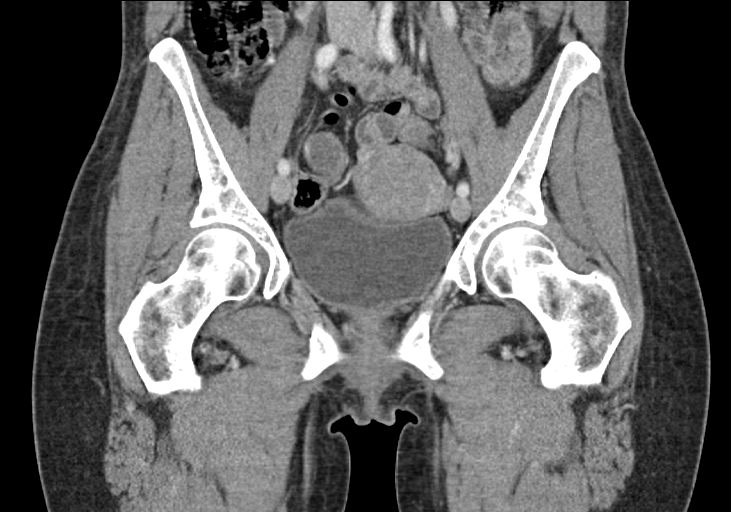
[im 61/110  soft-tissue]
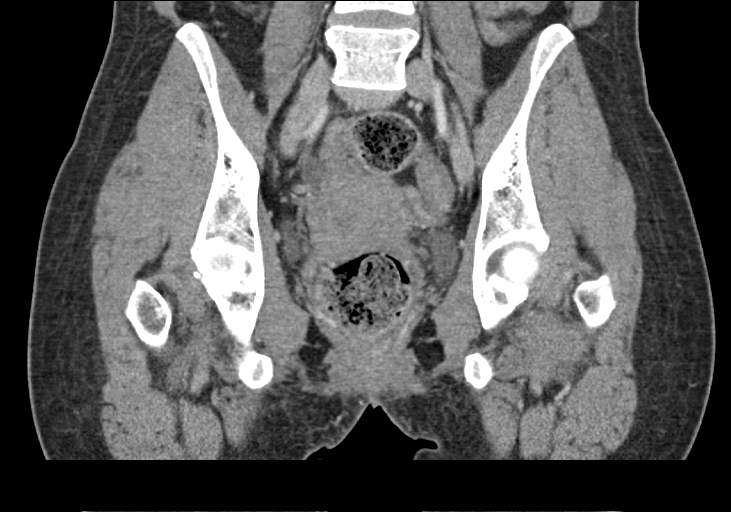

[16 of 46 positions shown; findings below may reference images not displayed]

FINDINGS: Urinary Tract:  No abnormality visualized.

Bowel: There is a large rectal stool ball. There is focal short
segment kinking/narrowing of the rectosigmoid colon on axial image
17. No evidence of bowel obstruction. The appendix is normal.

Vascular/Lymphatic: No pathologically enlarged lymph nodes. No
significant vascular abnormality seen.

Reproductive:  No mass or other significant abnormality

Other:  None

Musculoskeletal: No acute osseous abnormality. No suspicious lytic
or blastic lesion in the pelvis.
IMPRESSION: Large rectal stool ball without adjacent wall thickening or
inflammation.

Focal short-segment kinking/narrowing of the rectosigmoid colon,
favored to be due to focally decompressed colon, but underlying
neoplasm cannot be excluded by CT. Correlate with any recent
colonoscopy if previously performed, or could reimage after fecal
disimpaction.

## 2021-08-30 MED ORDER — LIDOCAINE HCL URETHRAL/MUCOSAL 2 % EX GEL
1.0000 "application " | Freq: Once | CUTANEOUS | Status: AC
  Administered 2021-08-30: 1 via TOPICAL
  Filled 2021-08-30: qty 11

## 2021-08-30 MED ORDER — LIDOCAINE VISCOUS HCL 2 % MT SOLN: 15.0000 mL | Freq: Once | OROMUCOSAL | Status: DC

## 2021-08-30 MED ORDER — ONDANSETRON 4 MG PO TBDP
4.0000 mg | ORAL_TABLET | Freq: Once | ORAL | Status: AC
  Administered 2021-08-30: 16:00:00 4 mg via ORAL
  Filled 2021-08-30: qty 1

## 2021-08-30 MED ORDER — OXYCODONE-ACETAMINOPHEN 5-325 MG PO TABS
1.0000 | ORAL_TABLET | Freq: Once | ORAL | Status: AC
  Administered 2021-08-30: 16:00:00 1 via ORAL
  Filled 2021-08-30: qty 1

## 2021-08-30 MED ORDER — IOHEXOL 350 MG/ML SOLN
80.0000 mL | Freq: Once | INTRAVENOUS | Status: AC | PRN
  Administered 2021-08-30: 16:00:00 80 mL via INTRAVENOUS

## 2021-08-30 MED ORDER — FLEET ENEMA 7-19 GM/118ML RE ENEM
1.0000 | ENEMA | Freq: Once | RECTAL | Status: AC
  Administered 2021-08-30: 20:00:00 1 via RECTAL
  Filled 2021-08-30: qty 1

## 2021-08-30 MED ORDER — FENTANYL CITRATE PF 50 MCG/ML IJ SOSY
50.0000 ug | PREFILLED_SYRINGE | Freq: Once | INTRAMUSCULAR | Status: AC
  Administered 2021-08-30: 19:00:00 50 ug via INTRAVENOUS
  Filled 2021-08-30: qty 1

## 2021-08-30 NOTE — Discharge Instructions (Signed)
Like we discussed, if you need to restart MiraLAX or begin taking a stool softener over the next 24 hours for your symptoms please do so.  I have sent a message to your oncologist regarding your visit this evening and they should hopefully discuss this with you further tomorrow.  If you develop any new or worsening symptoms please come back to the emergency department immediately.  It was a pleasure to meet you.

## 2021-08-30 NOTE — ED Provider Notes (Signed)
Emergency Medicine Provider Triage Evaluation Note  Ebony Lamb , a 45 y.o. female  was evaluated in triage.  Pt complains of who presents emergency department for evaluation of rectal pain.  She is currently under chemotherapy for breast cancer.  She is a [employer] and patient placed.  She has a history of hemorrhoids since her first pregnancy.  She states that she has been having significantly worsening pain both on the outside and the inside of her rectum.  She states she has severe pain inside of her rectum anytime she tries to make a bowel movement and is only been able to pass very small stools.  She has noticed some firm lumps on the outside of her rectum.  She took a sitz bath and has used Anusol prescribed by her oncologist without relief of her symptoms.  She denies fevers, chills, vomiting..  Review of Systems  Positive: Rectal pain Negative: fever  Physical Exam  BP 123/88    Pulse 85    Temp 98.4 F (36.9 C) (Oral)    Resp 20    SpO2 100%  Gen:   Awake, no distress   Resp:  Normal effort  MSK:   Moves extremities without difficulty  Other:  Appears uncomfortable  Medical Decision Making  Medically screening exam initiated at 3:33 PM.  Appropriate orders placed.  Ebony Lamb was informed that the remainder of the evaluation will be completed by another provider, this initial triage assessment does not replace that evaluation, and the importance of remaining in the ED until their evaluation is complete.  Patient here with rectal pain- pain internally with with defecation. I have ordered labs  and imaging.   Margarita Mail, PA-C 08/30/21 1541    Carmin Muskrat, MD 08/30/21 2028

## 2021-08-30 NOTE — ED Triage Notes (Signed)
Pt reports constipation and rectal pain.  States she phone the nurse who advised her to come to ED.

## 2021-08-30 NOTE — ED Provider Notes (Signed)
[facility] DEPT Provider Note   CSN: [id number] Arrival date & time: 08/30/21  1454     History Chief Complaint  Patient presents with   Rectal Pain    Ebony Lamb is a 45 y.o. female.  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  She states that her pain has been severe on the inside and outside of her rectum.  Does note a history of hemorrhoids.  Reports mild rectal bleeding when having BMs recently.  She states that she has a small amount of blood on the stool as well as when wiping.  Patient states she has been experiencing intermittent constipation since starting chemotherapy.  She states she had her last chemotherapy treatment 6 days ago.  She is due for chemotherapy tomorrow.  Denies any abdominal pain, chest pain, shortness of breath.    Past Medical History:  Diagnosis Date   Abnormal Pap smear 09/13/2000   Allergy 02/2020   Seasonal   Cancer ([place])    Depression    history of depression- denies currently   H/O varicella    Hypertension 09/14/2011   post partum- meds x 1 mo   No pertinent past medical history    Preterm labor     Patient Active Problem List   Diagnosis Date Noted   Port-A-Cath in place 08/10/2021   Genetic testing 06/17/2021   Family history of breast cancer 06/10/2021   Family history of ovarian cancer 06/10/2021   Family history of BRCA gene mutation 06/10/2021   Malignant neoplasm of upper-outer quadrant of right breast in female, estrogen receptor positive ([place]) 06/08/2021   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 Apgars 9/9.   CESAREAN SECTION N/A 03/22/2014   Procedure: Repeat CESAREAN SECTION;  Surgeon: Alwyn Pea, MD;  Location: [facility];  Service: Obstetrics;  Laterality: N/A;   COSMETIC SURGERY  11/22/2017   Abdominoplasty   FOOT SURGERY     FRACTURE SURGERY  08/1999   Repair of 5th metatarsal in left foot   HERNIA REPAIR  11/22/2017   PORTACATH PLACEMENT N/A 06/24/2021   Procedure: INSERTION PORT-A-CATH;  Surgeon: Coralie Keens, MD;  Location: [facility];  Service: General;  Laterality: N/A;   TUBAL LIGATION  11/22/2017   Tubes were completely removed   WISDOM TOOTH EXTRACTION       OB History     Gravida  3   Para  2   Term  2   Preterm      AB  1   Living  2      SAB      IAB  1   Ectopic      Multiple      Live Births  2  Family History  Problem Relation Age of Onset   Breast cancer Mother 36   Osteopenia Mother    Arthritis Mother    Other Mother        BRCA2 gene mutation   Hypertension Father    Kidney disease Father    Depression Father    Hyperlipidemia Father    Melanoma Father 56   Hypertension Brother    Hyperlipidemia Brother    Breast cancer Maternal Grandmother        dx. late 30s/40s   Heart disease Maternal Grandfather    Other Maternal Grandfather        BRCA2 gene mutation   Ovarian cancer Paternal Grandmother        dx. 33s   Stroke Paternal Grandfather    Birth defects Son    Anesthesia problems Neg Hx    Diabetes Neg Hx     Social History   Tobacco Use   Smoking status: Never   Smokeless tobacco: Never   Vaping Use   Vaping Use: Never used  Substance Use Topics   Alcohol use: Yes    Alcohol/week: 7.0 standard drinks    Types: 7 Standard drinks or equivalent per week    Comment: I stopped drinking alcohol after finding the lump in my brea   Drug use: No    Home Medications Prior to Admission medications   Medication Sig Start Date End Date Taking? Authorizing Provider  albuterol (VENTOLIN HFA) 108 (90 Base) MCG/ACT inhaler Inhale 2 puffs into the lungs every 6 (six) hours as needed for wheezing or shortness of breath. 06/30/21   Gardenia Phlegm, NP  doxycycline (VIBRA-TABS) 100 MG tablet Take 1 tablet (100 mg total) by mouth 2 (two) times daily. 06/30/21   Gardenia Phlegm, NP  doxycycline (VIBRA-TABS) 100 MG tablet Take 1 tablet (100 mg total) by mouth 2 (two) times daily. 07/31/21   Gardenia Phlegm, NP  hydrocortisone (ANUSOL-HC) 25 MG suppository Place 1 suppository (25 mg total) rectally 2 (two) times daily. 08/27/21   Magrinat, Virgie Dad, MD  hydrocortisone-pramoxine (PROCTOFOAM [facility]) rectal foam Place 1 applicator rectally 2 (two) times daily. As needed. 08/24/21   Magrinat, Virgie Dad, MD  lidocaine-prilocaine (EMLA) cream Apply to affected area once 06/10/21   Magrinat, Virgie Dad, MD  loratadine (CLARITIN) 10 MG tablet Take 1 tablet (10 mg total) by mouth daily. Take day post chemo and then daily for 10 days with each treatment 06/23/21   Magrinat, Virgie Dad, MD  LORazepam (ATIVAN) 0.5 MG tablet Take 1 tablet (0.5 mg total) by mouth at bedtime as needed (Nausea or vomiting). 06/10/21   Magrinat, Virgie Dad, MD  ondansetron (ZOFRAN) 8 MG tablet Take 1 tablet (8 mg total) by mouth every 8 (eight) hours as needed for nausea or vomiting. May take starting day 3 of chemotherapy 07/23/21   Magrinat, Virgie Dad, MD  oseltamivir (TAMIFLU) 75 MG capsule Take 1 capsule (75 mg total) by mouth 2 (two) times daily. 08/14/21   Gardenia Phlegm, NP  pramoxine-hydrocortisone cream  Apply topically 3 (three) times daily. 06/30/21   Gardenia Phlegm, NP  prochlorperazine (COMPAZINE) 10 MG tablet Take 1 tablet (10 mg total) by mouth every 6 (six) hours as needed (Nausea or vomiting). 06/10/21   Magrinat, Virgie Dad, MD  traMADol (ULTRAM) 50 MG tablet Take 1-2 tablets (50-100 mg total) by mouth every 6 (six) hours as needed. 06/24/21   Coralie Keens, MD  ferrous sulfate (FERROUSUL) 325 (65  FE) MG tablet Take 1 tablet (325 mg total) by mouth 2 (two) times daily. 11/17/11 11/26/11  Gypsy Lore, NP    Allergies    Patient has no known allergies.  Review of Systems   Review of Systems  All other systems reviewed and are negative. Ten systems reviewed and are negative for acute change, except as noted in the HPI.   Physical Exam Updated Vital Signs BP 119/88 (BP Location: Left Arm)    Pulse 62    Temp 98.4 F (36.9 C) (Oral)    Resp 16    SpO2 100%   Physical Exam Vitals and nursing note reviewed.  Constitutional:      General: She is not in acute distress.    Appearance: Normal appearance. She is not ill-appearing, toxic-appearing or diaphoretic.  HENT:     Head: Normocephalic and atraumatic.     Right Ear: External ear normal.     Left Ear: External ear normal.     Nose: Nose normal.     Mouth/Throat:     Mouth: Mucous membranes are moist.     Pharynx: Oropharynx is clear. No oropharyngeal exudate or posterior oropharyngeal erythema.  Eyes:     Extraocular Movements: Extraocular movements intact.  Cardiovascular:     Rate and Rhythm: Normal rate and regular rhythm.     Pulses: Normal pulses.     Heart sounds: Normal heart sounds. No murmur heard.   No friction rub. No gallop.  Pulmonary:     Effort: Pulmonary effort is normal. No respiratory distress.     Breath sounds: Normal breath sounds. No stridor. No wheezing, rhonchi or rales.  Abdominal:     General: Abdomen is flat.     Palpations: Abdomen is soft.     Tenderness: There is no abdominal  tenderness.  Genitourinary:    Comments: Small tender hemorrhoid noted.  Large amount of firm stool noted in the rectal vault.  Patient successfully disimpacted producing a moderate amount of brown stool. Musculoskeletal:        General: Normal range of motion.     Cervical back: Normal range of motion and neck supple. No tenderness.  Skin:    General: Skin is warm and dry.  Neurological:     General: No focal deficit present.     Mental Status: She is alert and oriented to person, place, and time.  Psychiatric:        Mood and Affect: Mood normal.        Behavior: Behavior normal.   ED Results / Procedures / Treatments   Labs (all labs ordered are listed, but only abnormal results are displayed) Labs Reviewed  CBC WITH DIFFERENTIAL/PLATELET - Abnormal; Notable for the following components:      Result Value   RBC 3.35 (*)    Hemoglobin 10.8 (*)    HCT 32.2 (*)    All other components within normal limits  BASIC METABOLIC PANEL - Abnormal; Notable for the following components:   Glucose, Bld 112 (*)    All other components within normal limits   EKG None  Radiology CT PELVIS W CONTRAST  Result Date: 08/30/2021 CLINICAL DATA:  Proctitis or pouchitis suspected EXAM: CT PELVIS WITH CONTRAST TECHNIQUE: Multidetector CT imaging of the pelvis was performed using the standard protocol following the bolus administration of intravenous contrast. CONTRAST:  12m OMNIPAQUE IOHEXOL 350 MG/ML SOLN COMPARISON:  None. FINDINGS: Urinary Tract:  No abnormality visualized. Bowel: There is a large rectal stool ball.  There is focal short segment kinking/narrowing of the rectosigmoid colon on axial image 17. No evidence of bowel obstruction. The appendix is normal. Vascular/Lymphatic: No pathologically enlarged lymph nodes. No significant vascular abnormality seen. Reproductive:  No mass or other significant abnormality Other:  None Musculoskeletal: No acute osseous abnormality. No suspicious lytic or  blastic lesion in the pelvis. IMPRESSION: Large rectal stool ball without adjacent wall thickening or inflammation. Focal short-segment kinking/narrowing of the rectosigmoid colon, favored to be due to focally decompressed colon, but underlying neoplasm cannot be excluded by CT. Correlate with any recent colonoscopy if previously performed, or could reimage after fecal disimpaction. Electronically Signed   By: Maurine Simmering M.D.   On: 08/30/2021 16:47    Procedures Procedures   Medications Ordered in ED Medications  oxyCODONE-acetaminophen (PERCOCET/ROXICET) 5-325 MG per tablet 1 tablet (1 tablet Oral Given 08/30/21 1604)  ondansetron (ZOFRAN-ODT) disintegrating tablet 4 mg (4 mg Oral Given 08/30/21 1604)  iohexol (OMNIPAQUE) 350 MG/ML injection 80 mL (80 mLs Intravenous Contrast Given 08/30/21 1625)  lidocaine (XYLOCAINE) 2 % jelly 1 application (1 application Topical Given 08/30/21 1847)  fentaNYL (SUBLIMAZE) injection 50 mcg (50 mcg Intravenous Given 08/30/21 1847)  sodium phosphate (FLEET) 7-19 GM/118ML enema 1 enema (1 enema Rectal Given 08/30/21 1935)   ED Course  I have reviewed the triage vital signs and the nursing notes.  Pertinent labs & imaging results that were available during my care of the patient were reviewed by me and considered in my medical decision making (see chart for details).    MDM Rules/Calculators/A&P                          Pt is a 45 y.o. female who presents to the emergency department with constipation and rectal pain.  Labs: CBC with RBCs of 3.35, hemoglobin of 10.8, hematocrit of 32.2. BMP with a glucose of 112.  Imaging: CT scan of the abdomen/pelvis shows IMPRESSION: Large rectal stool ball without adjacent wall thickening or inflammation. Focal short-segment kinking/narrowing of the rectosigmoid colon, favored to be due to focally decompressed colon, but underlying neoplasm cannot be excluded by CT. Correlate with any recent colonoscopy if previously  performed, or could reimage after fecal disimpaction.  I, Rayna Sexton, PA-C, personally reviewed and evaluated these images and lab results as part of my medical decision-making.  Physical exam significant for a tender external hemorrhoid.  Patient with a large amount of firm stool noted in the rectal vault.  Patient was manually disimpacted producing a moderate amount of brown stool.  Patient then given an enema producing a large amount of stool.  She notes significant relief of her symptoms.  We discussed patient's CT scan findings in length.  She is going to discuss the CT scan findings further tomorrow with her oncologist at her appointment for chemotherapy.  Lab work is reassuring.  Hemoglobin appears stable at 10.8.  No electrolyte derangements noted on BMP.  Feel the patient is stable for discharge at this time and she is agreeable.  We discussed return precautions.  Recommended that she continue with MiraLAX and stool softeners as needed for management of her symptoms.  Recommended that she discuss her bowel regimen with her oncologist tomorrow at her appointment.  Her questions were answered and she was amicable at the time of discharge.  Note: Portions of this report may have been transcribed using voice recognition software. Every effort was made to ensure accuracy; however, inadvertent computerized transcription  errors may be present.   Final Clinical Impression(s) / ED Diagnoses Final diagnoses:  Constipation, unspecified constipation type  Rectal pain  External hemorrhoid   Rx / DC Orders ED Discharge Orders     None        Rayna Sexton, PA-C 08/30/21 2135    Drenda Freeze, MD 08/30/21 2257

## 2021-08-30 NOTE — Progress Notes (Signed)
Ebony Lamb  Telephone:([phone]) [phone] Fax:(336) [phone]    ID: Ebony Lamb DOB: November 16, 1975  MR#: [id number]  CSN#:[id number]  Patient Care Team: Antony Contras, MD as PCP - General (Family Medicine) Coralie Keens, MD as Consulting Physician (General Surgery) Harlee Pursifull, Virgie Dad, MD as Consulting Physician (Oncology) Eppie Gibson, MD as Attending Physician (Radiation Oncology) Rockwell Germany, RN as Oncology Nurse Navigator Mauro Kaufmann, RN as Oncology Nurse Navigator Rozetta Nunnery, MD as Consulting Physician (Otolaryngology) Sanjuana Kava, MD as Referring Physician (Obstetrics and Gynecology) [name], MD as Consulting Physician (Plastic Surgery) Chauncey Cruel, MD OTHER MD:   CHIEF COMPLAINT: Weakly estrogen receptor positive breast cancer  CURRENT TREATMENT: Neoadjuvant chemotherapy   INTERVAL HISTORY: Ebony Lamb returns today for follow up and treatment of her weakly estrogen receptor positive breast cancer.   She began weekly carboplatin and paclitaxel, to be given x12, on 08/24/2021. Today is day 1 week 2.  She had a bowel movement on day 3.  She does not recall if it was hard or soft.  By day 6 however she was clearly constipated and she was having significant pain secondary to hemorrhoids despite taking Anusol and doing sitz bath's.  This took her to the emergency room 08/30/2021.  A CT of the pelvis was obtained which showed a fecal ball and some narrowing of the colon proximal to that.  She was disimpacted with some discomfort and given an enema.  She was still having some loose stool this morning.   REVIEW OF SYSTEMS: Dalayna had pretty much no nausea from the first cycle of Taxol/carbo.  She did take steroids as she did when she was on Cytoxan/Adriamycin which of course is much more emetogenic.  She finds that Zofran works much better for her but of course that is constipating and we are making changes in her medications are as noted below.  She  is planning to get her booster shots on 1222 and leave for [place], rec turning a week later for her third Taxol/carbo dose.   COVID 19 VACCINATION STATUS: Pfizer x3; infection 01/2021   HISTORY OF CURRENT ILLNESS: From the original intake note:  Ebony Lamb (pronounced "REECE") has a history of previous right breast biopsy (8 o'clock) in 07/2019 that was negative.  More recently she presented with a palpable right breast mass at 12 o'clock. She underwent right diagnostic mammography with tomography and right breast ultrasonography at [facility] on 06/01/2021 showing: breast density category D; palpable 3 cm irregular mass in right breast at 12 o'clock; possible 5 mm satellite lesion located 2.5 cm from dominant mass; abnormal right axillary lymph node.  Accordingly on 06/04/2021 she proceeded to biopsy of the right breast area in question. The pathology from this procedure ([id number]) showed: invasive ductal carcinoma, grade 3. Prognostic indicators significant for: estrogen receptor, 15% positive and progesterone receptor, 15% positive, both with weak-moderate staining intensity. Proliferation marker Ki67 at 70%. HER2 equivocal by immunohistochemistry (2+), but negative by fluorescent in situ hybridization with a signals ratio 1.47 and number per cell 2.50.  Biopsy of the questionable lymph node was negative and concordant   Cancer Staging  Malignant neoplasm of upper-outer quadrant of right breast in female, estrogen receptor positive ([place]) Staging form: Breast, AJCC 8th Edition - Clinical stage from 06/10/2021: Stage IIA (cT2, cN0, cM0, G3, ER+, PR+, HER2-) - Signed by Chauncey Cruel, MD on 06/10/2021 Stage prefix: Initial diagnosis Histologic grading system: 3 grade system Laterality: Right  Staged by: Pathologist and managing physician Stage used in treatment planning: Yes National guidelines used in treatment planning: Yes Type of national guideline used in treatment  planning: NCCN  The patient's subsequent history is as detailed below.   PAST MEDICAL HISTORY: Past Medical History:  Diagnosis Date   Abnormal Pap smear 09/13/2000   Allergy 02/2020   Seasonal   Cancer ([place])    Depression    history of depression- denies currently   H/O varicella    Hypertension 09/14/2011   post partum- meds x 1 mo   No pertinent past medical history    Preterm labor     PAST SURGICAL HISTORY: Past Surgical History:  Procedure Laterality Date   BREAST BIOPSY Right 07/27/2019   CESAREAN SECTION  11/14/2011   Procedure: CESAREAN SECTION;  Surgeon: Eli Hose, MD;  Location: [facility];  Service: Gynecology;  Laterality: N/A;  Primary cesarean section with delivery of baby boy at [phone]. Apgars 9/9.   CESAREAN SECTION N/A 03/22/2014   Procedure: Repeat CESAREAN SECTION;  Surgeon: Alwyn Pea, MD;  Location: [facility];  Service: Obstetrics;  Laterality: N/A;   COSMETIC SURGERY  11/22/2017   Abdominoplasty   FOOT SURGERY     FRACTURE SURGERY  08/1999   Repair of 5th metatarsal in left foot   HERNIA REPAIR  11/22/2017   PORTACATH PLACEMENT N/A 06/24/2021   Procedure: INSERTION PORT-A-CATH;  Surgeon: Coralie Keens, MD;  Location: [facility];  Service: General;  Laterality: N/A;   TUBAL LIGATION  11/22/2017   Tubes were completely removed   WISDOM TOOTH EXTRACTION      FAMILY HISTORY: Family History  Problem Relation Age of Onset   Breast cancer Mother 34   Osteopenia Mother    Arthritis Mother    Other Mother        BRCA2 gene mutation   Hypertension Father    Kidney disease Father    Depression Father    Hyperlipidemia Father    Melanoma Father 67   Hypertension Brother    Hyperlipidemia Brother    Breast cancer Maternal Grandmother        dx. late 30s/40s   Heart disease Maternal Grandfather    Other Maternal Grandfather        BRCA2 gene mutation   Ovarian cancer Paternal Grandmother        dx. 66s   Stroke Paternal  Grandfather    Birth defects Son    Anesthesia problems Neg Hx    Diabetes Neg Hx    Her parents are both living, her father age 29 and her mother age 37 as of 05/2021. Cena has one brother (and no sisters). She reports breast cancer in her mother at age 31 and her maternal grandmother in her late 48's, melanoma in her father at age 42, and ovarian cancer in her paternal grandmother in her 68's.   GYNECOLOGIC HISTORY:  No LMP recorded. Menarche: 45 years old Age at first live birth: 45 years old [place] P 2 LMP 06/06/2021 Contraceptive: used from 1996-2003 and 2006-2012 HRT n/a  Hysterectomy? no BSO? 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 Husband [name] work for [employer] in [employer]. She lives at home with [name] and their two children-- Tiffany Kocher, age 59, and Cori Razor, age 24. 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    ADVANCED DIRECTIVES: in place   HEALTH MAINTENANCE: Social  History   Tobacco Use   Smoking status: Never   Smokeless tobacco: Never  Vaping Use   Vaping Use: Never used  Substance Use Topics   Alcohol use: Yes    Alcohol/week: 7.0 standard drinks    Types: 7 Standard drinks or equivalent per week    Comment: I stopped drinking alcohol after finding the lump in my brea   Drug use: No     Colonoscopy: never done (age)  PAP: 06/2020  Bone density: never done (age)   No Known Allergies  Current Outpatient Medications  Medication Sig Dispense Refill   albuterol (VENTOLIN HFA) 108 (90 Base) MCG/ACT inhaler Inhale 2 puffs into the lungs every 6 (six) hours as needed for wheezing or shortness of breath. 8 g 2   doxycycline (VIBRA-TABS) 100 MG tablet Take 1 tablet (100 mg total) by mouth 2 (two) times daily. 10 tablet 0   doxycycline (VIBRA-TABS) 100 MG tablet Take 1 tablet (100 mg total) by mouth 2 (two) times daily. 14 tablet 0   hydrocortisone (ANUSOL-HC) 25 MG suppository Place 1  suppository (25 mg total) rectally 2 (two) times daily. 12 suppository 2   hydrocortisone-pramoxine (PROCTOFOAM HC) rectal foam Place 1 applicator rectally 2 (two) times daily. As needed. 20 g 1   lidocaine-prilocaine (EMLA) cream Apply to affected area once 30 g 3   loratadine (CLARITIN) 10 MG tablet Take 1 tablet (10 mg total) by mouth daily. Take day post chemo and then daily for 10 days with each treatment 30 tablet 3   LORazepam (ATIVAN) 0.5 MG tablet Take 1 tablet (0.5 mg total) by mouth at bedtime as needed (Nausea or vomiting). 30 tablet 0   ondansetron (ZOFRAN) 8 MG tablet Take 1 tablet (8 mg total) by mouth every 8 (eight) hours as needed for nausea or vomiting. May take starting day 3 of chemotherapy 20 tablet 0   oseltamivir (TAMIFLU) 75 MG capsule Take 1 capsule (75 mg total) by mouth 2 (two) times daily. 10 capsule 0   pramoxine-hydrocortisone cream Apply topically 3 (three) times daily. 57 g 0   prochlorperazine (COMPAZINE) 10 MG tablet Take 1 tablet (10 mg total) by mouth every 6 (six) hours as needed (Nausea or vomiting). 30 tablet 1   traMADol (ULTRAM) 50 MG tablet Take 1-2 tablets (50-100 mg total) by mouth every 6 (six) hours as needed. 20 tablet 0   No current facility-administered medications for this visit.   Facility-Administered Medications Ordered in Other Visits  Medication Dose Route Frequency Provider Last Rate Last Admin   CARBOplatin (PARAPLATIN) 210 mg in sodium chloride 0.9 % 100 mL chemo infusion  210 mg Intravenous Once Tanuj Mullens, Virgie Dad, MD       famotidine (PEPCID) IVPB 20 mg in NS 100 mL IVPB  20 mg Intravenous Once Ensley Blas, Virgie Dad, MD 400 mL/hr at 08/31/21 0934 20 mg at 08/31/21 0934   heparin lock flush 100 unit/mL  500 Units Intracatheter Once PRN Bartlett Enke, Virgie Dad, MD       influenza vac split quadrivalent PF (FLUARIX) 0.5 ML injection            influenza vac split quadrivalent PF (FLUARIX) injection 0.5 mL  0.5 mL Intramuscular Once Tessla Spurling, Virgie Dad, MD       PACLitaxel (TAXOL) 132 mg in sodium chloride 0.9 % 250 mL chemo infusion (</= 33m/m2)  80 mg/m2 (Treatment Plan Recorded) Intravenous Once Emanuele Mcwhirter, GVirgie Dad MD       sodium chloride flush (  NS) 0.9 % injection 10 mL  10 mL Intracatheter PRN Mykenzie Ebanks, Virgie Dad, MD        OBJECTIVE: White woman examined in the infusion area  There were no vitals filed for this visit. For vitals associated with the 08/31/2021 visit please see the infusion area flowsheet     There is no height or weight on file to calculate BMI.   Wt Readings from Last 3 Encounters:  08/31/21 119 lb (54 kg)  08/27/21 122 lb 9.6 oz (55.6 kg)  08/24/21 118 lb 12.8 oz (53.9 kg)     ECOG FS:1 - Symptomatic but completely ambulatory  Sclerae unicteric, EOMs intact Wearing a mask Lungs no rales or rhonchi Heart regular rate and rhythm Abd soft, nontender Neuro: nonfocal Breasts: Deferred   LAB RESULTS:  CMP     Component Value Date/Time   NA 141 08/31/2021 0811   K 3.6 08/31/2021 0811   CL 108 08/31/2021 0811   CO2 23 08/31/2021 0811   GLUCOSE 130 (H) 08/31/2021 0811   BUN 10 08/31/2021 0811   CREATININE 0.68 08/31/2021 0811   CALCIUM 8.9 08/31/2021 0811   PROT 6.5 08/31/2021 0811   ALBUMIN 3.8 08/31/2021 0811   AST 24 08/31/2021 0811   ALT 25 08/31/2021 0811   ALKPHOS 70 08/31/2021 0811   BILITOT 0.4 08/31/2021 0811   GFRNONAA >60 08/31/2021 0811   GFRAA >90 11/26/2011 2008    No results found for: TOTALPROTELP, ALBUMINELP, A1GS, A2GS, BETS, BETA2SER, GAMS, MSPIKE, SPEI  Lab Results  Component Value Date   WBC 3.8 (L) 08/31/2021   NEUTROABS 2.9 08/31/2021   HGB 10.1 (L) 08/31/2021   HCT 30.1 (L) 08/31/2021   MCV 95.0 08/31/2021   PLT 302 08/31/2021    No results found for: LABCA2  No components found for: EQASTM196  No results for input(s): INR in the last 168 hours.  No results found for: LABCA2  No results found for: QIW979  No results found for: GXQ119  No results  found for: ERD408  No results found for: CA2729  No components found for: HGQUANT  No results found for: CEA1 / No results found for: CEA1   No results found for: AFPTUMOR  No results found for: CHROMOGRNA  No results found for: KPAFRELGTCHN, LAMBDASER, KAPLAMBRATIO (kappa/lambda light chains)  No results found for: HGBA, HGBA2QUANT, HGBFQUANT, HGBSQUAN (Hemoglobinopathy evaluation)   Lab Results  Component Value Date   LDH 223 11/26/2011    No results found for: IRON, TIBC, IRONPCTSAT (Iron and TIBC)  No results found for: FERRITIN  Urinalysis    Component Value Date/Time   COLORURINE YELLOW 08/14/2021 0940   APPEARANCEUR HAZY (A) 08/14/2021 0940   LABSPEC 1.015 08/14/2021 0940   PHURINE 5.0 08/14/2021 0940   GLUCOSEU NEGATIVE 08/14/2021 0940   HGBUR NEGATIVE 08/14/2021 0940   BILIRUBINUR NEGATIVE 08/14/2021 0940   KETONESUR NEGATIVE 08/14/2021 0940   PROTEINUR NEGATIVE 08/14/2021 0940   UROBILINOGEN 0.2 11/26/2011 2000   NITRITE NEGATIVE 08/14/2021 0940   LEUKOCYTESUR NEGATIVE 08/14/2021 0940    STUDIES: DG Chest 2 View  Result Date: 08/14/2021 CLINICAL DATA:  Fever, cough. EXAM: CHEST - 2 VIEW COMPARISON:  June 30, 2021. FINDINGS: The heart size and mediastinal contours are within normal limits. Both lungs are clear. The visualized skeletal structures are unremarkable. IMPRESSION: No active cardiopulmonary disease. Electronically Signed   By: Marijo Conception M.D.   On: 08/14/2021 09:17   CT PELVIS W CONTRAST  Result Date: 08/30/2021 CLINICAL  DATA:  Proctitis or pouchitis suspected EXAM: CT PELVIS WITH CONTRAST TECHNIQUE: Multidetector CT imaging of the pelvis was performed using the standard protocol following the bolus administration of intravenous contrast. CONTRAST:  59m OMNIPAQUE IOHEXOL 350 MG/ML SOLN COMPARISON:  None. FINDINGS: Urinary Tract:  No abnormality visualized. Bowel: There is a large rectal stool ball. There is focal short segment  kinking/narrowing of the rectosigmoid colon on axial image 17. No evidence of bowel obstruction. The appendix is normal. Vascular/Lymphatic: No pathologically enlarged lymph nodes. No significant vascular abnormality seen. Reproductive:  No mass or other significant abnormality Other:  None Musculoskeletal: No acute osseous abnormality. No suspicious lytic or blastic lesion in the pelvis. IMPRESSION: Large rectal stool ball without adjacent wall thickening or inflammation. Focal short-segment kinking/narrowing of the rectosigmoid colon, favored to be due to focally decompressed colon, but underlying neoplasm cannot be excluded by CT. Correlate with any recent colonoscopy if previously performed, or could reimage after fecal disimpaction. Electronically Signed   By: JMaurine SimmeringM.D.   On: 08/30/2021 16:47   [place]AST LTD UNI RIGHT INC AXILLA  Addendum Date: 08/12/2021   ADDENDUM REPORT: 08/12/2021 10:03 ADDENDUM: This is an error to the report originally dictated on 08/11/2021. Within the FINDINGS section, please note all evaluation and findings were within the RIGHT breast and RIGHT axilla. Electronically Signed   By: SKristopher OppenheimM.D.   On: 08/12/2021 10:03   Result Date: 08/12/2021 CLINICAL DATA:  45year old female with recently diagnosed right breast malignancy presents for ultrasound evaluation after neoadjuvant chemotherapy. EXAM: ULTRASOUND OF THE RIGHT BREAST COMPARISON:  Previous exam(s). FINDINGS: Targeted ultrasound is performed, showing interval decrease in size of an irregular lobulated mass at the 12 o'clock position 2 cm from the nipple. An echogenic focus is consistent with a post biopsy clip. It now measures 1.6 x 1.3 x 0.9 cm (previously 3.0 x 2.7 x 1.6 cm. An adjacent satellite lesion with previous clip placement measures 4 x 3 x 2 mm (previously 5 x 5 x 2 mm). A third hypoechoic mass at the 10 o'clock position 5 cm from the nipple measures 4 x 4 x 2 mm (previously 5 x 5 x 3 mm). Please  note, this lesion has not been biopsied or localized with a clip. Evaluation of the left axilla demonstrates the previously biopsied lymph node with associated post biopsy clip, measuring 3 mm in cortical thickness (previously 5 mm). IMPRESSION: 1. Interval decrease in size of the previously biopsied masses and lymph node consistent with response to chemotherapy. 2. Stable appearance of a hypoechoic mass at the 10 o'clock position 5 cm from the nipple incidentally identified at the time of the patient's ultrasound-guided biopsy. Of note, this mass has not been biopsied or localized. RECOMMENDATION: Per clinical treatment plan. I have discussed the findings and recommendations with the patient. If applicable, a reminder letter will be sent to the patient regarding the next appointment. BI-RADS CATEGORY  6: Known biopsy-proven malignancy. Electronically Signed: By: SKristopher OppenheimM.D. On: 08/11/2021 16:29    ELIGIBLE FOR AVAILABLE RESEARCH PROTOCOL: no  ASSESSMENT: 45y.o. [place] woman status post right breast upper outer quadrant biopsy 06/04/2021 for a clinical mT2 N0 invasive ductal carcinoma, grade 3, estrogen and progesterone receptor weakly positive, HER2 not amplified, with an MIB-1 of 70%  (A) chest CT with contrast 06/22/2021 shows right breast mass, no other lesions of concern; pulmonary nodules all less than 5 mm noted, likely benign  (B) bone scan 06/22/2021 negative for metastases;  consistent with sacroiliitis  (1) genetics testing: (A) [facility] BRCAplus dated 06/16/2021 finds no deleterious mutations in ATM, BRCA1, BRCA2, CDH1, CHEK2, PALB2, PTEN, and TP53 (B) The [facility]-Expanded gene panel obtained 06/19/2021 showed no deleterious mutations in AIP, ALK, APC, ATM, AXIN2, BAP1, BARD1, BLM, BMPR1A, BRCA1, BRCA2, BRIP1, CDC73, CDH1, CDK4, CDKN1B, CDKN2A, CHEK2, CTNNA1, DICER1, FANCC, FH, FLCN, GALNT12, KIF1B, LZTR1, MAX, MEN1, MET, MLH1, MSH2, MSH3, MSH6, MUTYH, NBN, NF1, NF2, NTHL1, PALB2,  PHOX2B, PMS2, POT1, PRKAR1A, PTCH1, PTEN, RAD51C, RAD51D, RB1, RECQL, RET, SDHA, SDHAF2, SDHB, SDHC, SDHD, SMAD4, SMARCA4, SMARCB1, SMARCE1, STK11, SUFU, TMEM127, TP53, TSC1, TSC2, VHL and XRCC2 (sequencing and deletion/duplication); EGFR, EGLN1, HOXB13, KIT, MITF, PDGFRA, POLD1, and POLE (sequencing only); EPCAM and GREM1 (deletion/duplication only). (C)  a variant of uncertain significance was identified in the PTCH1 gene  (2) neoadjuvant chemotherapy consisting of doxorubicin and cyclophosphamide in dose dense fashion x4 started 0/13/2022, completed 08/10/2021, to be followed by weekly carboplatin and paclitaxel x12  (3) definitive surgery to follow  (4) adjuvant radiation as appropriate  (5) antiestrogens   PLAN: Carleigh tolerated her first cycle of carbo/Taxol without unusual side effects.  The big issues we are having are constipation and hemorrhoids.  She was given Percocet for her hemorrhoidal pain in the ED.  This actually did provide some relief but unfortunately it is likely to make her constipation worse so that is not a good option.  I am stopping the palonosetron and premeds and asking her not to take ondansetron.  I am dropping the dexamethasone to 4 mg in the morning only on days 2 and 3 and if she has no nausea with that then we will discontinue that beginning with the third dose of Taxol and carbo.  In stead of ondansetron or prochlorperazine I have asked her to take metoclopramide for nausea if she does not have any nausea.  This of course will increase peristalsis.  In addition she will take MiraLAX daily and she will take stool softeners twice daily beginning today.  If she finds that she has too many and too loose bowel movements then she will stop the stool softeners and eventually even take the MiraLAX every other day.  Otherwise she will return 09/11/2021 for cycle #3.  She expressed some interest in possibly meeting with Dr. Morey Hummingbird since she also does GI issues but of  course with Dr. Morey Hummingbird actually does his GI cancers.  In any case Niylah knows that she is welcome to choose her doctor and that she can discuss that further with Dr. Sonny Dandy.  I am hoping however that by the time she does meet with him the hemorrhoids will have improved and her nausea will be well controlled and she will have no constipation.  I think the narrowing of the colon proximal to the "stool ball" was physiologic.  The colon was doing what it could to push that bowel along and accordingly was squeezing.  I do not feel a need to repeat pelvic CT to demonstrate that but if we do that I would make sure that she takes oral contrast.  Total encounter time 25 minutes.*  Total encounter time 25 minutes.Sarajane Jews C. Arhan Mcmanamon, MD 08/31/21 9:44 AM Medical Oncology and Hematology [facility] [place], [place] [address] Tel. [phone]    Fax. [phone]   I, Wilburn Mylar, am acting as scribe for Dr. Virgie Dad. Rifky Lapre.  Lindie Spruce MD, have reviewed the above documentation for accuracy and completeness,  and I agree with the above.   *Total Encounter Time as defined by the Centers for Medicare and Medicaid Services includes, in addition to the face-to-face time of a patient visit (documented in the note above) non-face-to-face time: obtaining and reviewing outside history, ordering and reviewing medications, tests or procedures, care coordination (communications with other health care professionals or caregivers) and documentation in the medical record.

## 2021-08-31 ENCOUNTER — Inpatient Hospital Stay: Payer: No Typology Code available for payment source

## 2021-08-31 ENCOUNTER — Inpatient Hospital Stay (HOSPITAL_BASED_OUTPATIENT_CLINIC_OR_DEPARTMENT_OTHER): Payer: No Typology Code available for payment source | Admitting: Oncology

## 2021-08-31 VITALS — BP 118/89 | HR 89 | Temp 98.6°F | Resp 18 | Wt 119.0 lb

## 2021-08-31 DIAGNOSIS — Z95828 Presence of other vascular implants and grafts: Secondary | ICD-10-CM

## 2021-08-31 DIAGNOSIS — C50411 Malignant neoplasm of upper-outer quadrant of right female breast: Secondary | ICD-10-CM | POA: Diagnosis not present

## 2021-08-31 DIAGNOSIS — Z170 Estrogen receptor positive status [ER+]: Secondary | ICD-10-CM

## 2021-08-31 LAB — CBC WITH DIFFERENTIAL (CANCER CENTER ONLY)
Abs Immature Granulocytes: 0.02 10*3/uL (ref 0.00–0.07)
Basophils Absolute: 0.1 10*3/uL (ref 0.0–0.1)
Basophils Relative: 1 %
Eosinophils Absolute: 0 10*3/uL (ref 0.0–0.5)
Eosinophils Relative: 1 %
HCT: 30.1 % — ABNORMAL LOW (ref 36.0–46.0)
Hemoglobin: 10.1 g/dL — ABNORMAL LOW (ref 12.0–15.0)
Immature Granulocytes: 1 %
Lymphocytes Relative: 15 %
Lymphs Abs: 0.6 10*3/uL — ABNORMAL LOW (ref 0.7–4.0)
MCH: 31.9 pg (ref 26.0–34.0)
MCHC: 33.6 g/dL (ref 30.0–36.0)
MCV: 95 fL (ref 80.0–100.0)
Monocytes Absolute: 0.3 10*3/uL (ref 0.1–1.0)
Monocytes Relative: 8 %
Neutro Abs: 2.9 10*3/uL (ref 1.7–7.7)
Neutrophils Relative %: 74 %
Platelet Count: 302 10*3/uL (ref 150–400)
RBC: 3.17 MIL/uL — ABNORMAL LOW (ref 3.87–5.11)
RDW: 15.3 % (ref 11.5–15.5)
WBC Count: 3.8 10*3/uL — ABNORMAL LOW (ref 4.0–10.5)
nRBC: 0 % (ref 0.0–0.2)

## 2021-08-31 LAB — CMP (CANCER CENTER ONLY)
ALT: 25 U/L (ref 0–44)
AST: 24 U/L (ref 15–41)
Albumin: 3.8 g/dL (ref 3.5–5.0)
Alkaline Phosphatase: 70 U/L (ref 38–126)
Anion gap: 10 (ref 5–15)
BUN: 10 mg/dL (ref 6–20)
CO2: 23 mmol/L (ref 22–32)
Calcium: 8.9 mg/dL (ref 8.9–10.3)
Chloride: 108 mmol/L (ref 98–111)
Creatinine: 0.68 mg/dL (ref 0.44–1.00)
GFR, Estimated: >60 mL/min (ref >60)
Glucose, Bld: 130 mg/dL — ABNORMAL HIGH (ref 70–99)
Potassium: 3.6 mmol/L (ref 3.5–5.1)
Sodium: 141 mmol/L (ref 135–145)
Total Bilirubin: 0.4 mg/dL (ref 0.3–1.2)
Total Protein: 6.5 g/dL (ref 6.5–8.1)

## 2021-08-31 LAB — PREGNANCY, URINE: Preg Test, Ur: NEGATIVE

## 2021-08-31 MED ORDER — SODIUM CHLORIDE 0.9% FLUSH
10.0000 mL | Freq: Once | INTRAVENOUS | Status: AC
  Administered 2021-08-31: 08:00:00 10 mL

## 2021-08-31 MED ORDER — SODIUM CHLORIDE 0.9 % IV SOLN
80.0000 mg/m2 | Freq: Once | INTRAVENOUS | Status: AC
  Administered 2021-08-31: 11:00:00 132 mg via INTRAVENOUS
  Filled 2021-08-31: qty 22

## 2021-08-31 MED ORDER — DEXAMETHASONE SODIUM PHOSPHATE 100 MG/10ML IJ SOLN
10.0000 mg | Freq: Once | INTRAMUSCULAR | Status: AC
  Administered 2021-08-31: 09:00:00 10 mg via INTRAVENOUS
  Filled 2021-08-31: qty 10

## 2021-08-31 MED ORDER — DIPHENHYDRAMINE HCL 50 MG/ML IJ SOLN
25.0000 mg | Freq: Once | INTRAMUSCULAR | Status: AC
  Administered 2021-08-31: 09:00:00 25 mg via INTRAVENOUS
  Filled 2021-08-31: qty 1

## 2021-08-31 MED ORDER — HEPARIN SOD (PORK) LOCK FLUSH 100 UNIT/ML IV SOLN
500.0000 [IU] | Freq: Once | INTRAVENOUS | Status: AC | PRN
  Administered 2021-08-31: 13:00:00 500 [IU]

## 2021-08-31 MED ORDER — SODIUM CHLORIDE 0.9 % IV SOLN: Freq: Once | INTRAVENOUS | Status: AC

## 2021-08-31 MED ORDER — SODIUM CHLORIDE 0.9 % IV SOLN
210.6000 mg | Freq: Once | INTRAVENOUS | Status: AC
  Administered 2021-08-31: 12:00:00 210 mg via INTRAVENOUS
  Filled 2021-08-31: qty 21

## 2021-08-31 MED ORDER — FAMOTIDINE 20 MG IN NS 100 ML IVPB
20.0000 mg | Freq: Once | INTRAVENOUS | Status: AC
  Administered 2021-08-31: 10:00:00 20 mg via INTRAVENOUS
  Filled 2021-08-31: qty 100

## 2021-08-31 MED ORDER — SODIUM CHLORIDE 0.9% FLUSH
10.0000 mL | INTRAVENOUS | Status: DC | PRN
  Administered 2021-08-31: 13:00:00 10 mL

## 2021-08-31 NOTE — Patient Instructions (Signed)
[facility]  Discharge Instructions: Thank you for choosing [facility] to provide your oncology and hematology care.   If you have a lab appointment with the Cancer Center, please go directly to the Cancer Center and check in at the registration area.   Wear comfortable clothing and clothing appropriate for easy access to any Portacath or PICC line.   We strive to give you quality time with your provider. You may need to reschedule your appointment if you arrive late (15 or more minutes).  Arriving late affects you and other patients whose appointments are after yours.  Also, if you miss three or more appointments without notifying the office, you may be dismissed from the clinic at the provider's discretion.      For prescription refill requests, have your pharmacy contact our office and allow 72 hours for refills to be completed.    Today you received the following chemotherapy and/or immunotherapy agents taxol/carboplatin     To help prevent nausea and vomiting after your treatment, we encourage you to take your nausea medication as directed.  BELOW ARE SYMPTOMS THAT SHOULD BE REPORTED IMMEDIATELY: *FEVER GREATER THAN 100.4 F (38 C) OR HIGHER *CHILLS OR SWEATING *NAUSEA AND VOMITING THAT IS NOT CONTROLLED WITH YOUR NAUSEA MEDICATION *UNUSUAL SHORTNESS OF BREATH *UNUSUAL BRUISING OR BLEEDING *URINARY PROBLEMS (pain or burning when urinating, or frequent urination) *BOWEL PROBLEMS (unusual diarrhea, constipation, pain near the anus) TENDERNESS IN MOUTH AND THROAT WITH OR WITHOUT PRESENCE OF ULCERS (sore throat, sores in mouth, or a toothache) UNUSUAL RASH, SWELLING OR PAIN  UNUSUAL VAGINAL DISCHARGE OR ITCHING   Items with * indicate a potential emergency and should be followed up as soon as possible or go to the Emergency Department if any problems should occur.  Please show the CHEMOTHERAPY ALERT CARD or IMMUNOTHERAPY ALERT CARD at  check-in to the Emergency Department and triage nurse.  Should you have questions after your visit or need to cancel or reschedule your appointment, please contact [facility]  Dept: [phone]  and follow the prompts.  Office hours are 8:00 a.m. to 4:30 p.m. Monday - Friday. Please note that voicemails left after 4:00 p.m. may not be returned until the following business day.  We are closed weekends and major holidays. You have access to a nurse at all times for urgent questions. Please call the main number to the clinic Dept: [phone] and follow the prompts.   For any non-urgent questions, you may also contact your provider using MyChart. We now offer e-Visits for anyone 18 and older to request care online for non-urgent symptoms. For details visit [email or web address].   Also download the MyChart app! Go to the app store, search "MyChart", open the app, select [facility], and log in with your MyChart username and password.  Due to Covid, a mask is required upon entering the hospital/clinic. If you do not have a mask, one will be given to you upon arrival. For doctor visits, patients may have 1 support person aged 18 or older with them. For treatment visits, patients cannot have anyone with them due to current Covid guidelines and our immunocompromised population.   

## 2021-09-01 ENCOUNTER — Encounter: Payer: Self-pay | Admitting: Oncology

## 2021-09-01 ENCOUNTER — Telehealth: Payer: Self-pay | Admitting: *Deleted

## 2021-09-01 MED ORDER — METOCLOPRAMIDE HCL 5 MG PO TABS: 5.0000 mg | ORAL_TABLET | Freq: Four times a day (QID) | ORAL | 1 refills | Status: DC | PRN

## 2021-09-01 NOTE — Telephone Encounter (Signed)
No entry 

## 2021-09-01 NOTE — Telephone Encounter (Signed)
This RN spoke with pt per her call stating the reglan has not been sent to her pharmacy.  She states she is continuing to have nausea.  Constipation is resolved with her stating she has had 2 BM's this AM.  She has compazine " but am afraid to use it because of the severity of constipation I had over the weekend and had to go to the ER and get disimpacted "  This RN sent order over during conversation with noted confirmation as received.  This RN informed pt she may use 1 compazine presently for the nausea - then institute the reglan as ordered.  Pt verbalized understanding.

## 2021-09-10 ENCOUNTER — Other Ambulatory Visit: Payer: Self-pay | Admitting: *Deleted

## 2021-09-10 ENCOUNTER — Encounter: Payer: Self-pay | Admitting: *Deleted

## 2021-09-10 DIAGNOSIS — Z170 Estrogen receptor positive status [ER+]: Secondary | ICD-10-CM

## 2021-09-10 NOTE — Progress Notes (Signed)
Patient Care Team: Antony Contras, MD as PCP - General (Family Medicine) Coralie Keens, MD as Consulting Physician (General Surgery) Magrinat, Virgie Dad, MD as Consulting Physician (Oncology) Eppie Gibson, MD as Attending Physician (Radiation Oncology) Rockwell Germany, RN as Oncology Nurse Navigator Mauro Kaufmann, RN as Oncology Nurse Navigator Rozetta Nunnery, MD (Inactive) as Consulting Physician (Otolaryngology) Sanjuana Kava, MD as Referring Physician (Obstetrics and Gynecology) [name], MD as Consulting Physician (Plastic Surgery)  DIAGNOSIS:    ICD-10-CM   1. 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 with HER2 not amplified), with an MIB-1 of 70%             (A) chest CT with contrast 06/22/2021 shows right breast mass, no other lesions of concern; pulmonary nodules all less than 5 mm noted, likely benign             (B) bone scan 06/22/2021 negative for metastases; consistent with sacroiliitis   06/10/2021 Cancer Staging   Staging form: Breast, AJCC 8th Edition - Clinical stage from 06/10/2021: Stage IIA (cT2, cN0, cM0, G3, ER+, PR+, HER2-) - Signed by Chauncey Cruel, MD on 06/10/2021 Stage prefix: Initial diagnosis Histologic grading system: 3 grade system Laterality: Right Staged by: Pathologist and managing physician Stage used in treatment planning: Yes National guidelines used in treatment planning: Yes Type of national guideline used in treatment planning: NCCN    06/16/2021 Genetic  Testing   [facility] CancerNext-Expanded was negative. No pathogenic variants were identified. 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 Report date is 06/19/2021.  The CancerNext-Expanded gene panel offered by [facility] and includes sequencing, rearrangement, and RNA analysis for the following 77 genes: AIP, ALK, APC, ATM, AXIN2, BAP1, BARD1, BLM, BMPR1A, BRCA1, BRCA2, BRIP1, CDC73, CDH1, CDK4, CDKN1B, CDKN2A, CHEK2, CTNNA1, DICER1, FANCC, FH, FLCN, GALNT12, KIF1B, LZTR1, MAX, MEN1, MET, MLH1, MSH2, MSH3, MSH6, MUTYH, NBN, NF1, NF2, NTHL1, PALB2, PHOX2B, PMS2, POT1, PRKAR1A, PTCH1, PTEN, RAD51C, RAD51D, RB1, RECQL, RET, SDHA, SDHAF2, SDHB, SDHC, SDHD, SMAD4, SMARCA4, SMARCB1, SMARCE1, STK11, SUFU, TMEM127, TP53, TSC1, TSC2, VHL and XRCC2 (sequencing and deletion/duplication); EGFR, EGLN1, HOXB13, KIT, MITF, PDGFRA, POLD1, and POLE (sequencing only); EPCAM and GREM1 (deletion/duplication only).    06/25/2021 -  Neo-Adjuvant Chemotherapy   neoadjuvant chemotherapy consisting of doxorubicin and cyclophosphamide in dose dense fashion x4 started 0/13/2022, completed 08/10/2021, to be followed by weekly carboplatin and paclitaxel x12 starting 08/24/2021     CHIEF COMPLIANT: Neoadjuvant chemotherapy  INTERVAL HISTORY: Ebony Lamb is a 45 y.o. with above-mentioned history of weakly estrogen receptor positive breast cancer.  She is currently on neoadjuvant chemotherapy.  She completed dose dense Adriamycin and Cytoxan and is currently on Taxol and carboplatin. She presents to the clinic today for follow-up. She was in the emergency room with severe constipation and impacted bowel and required an enema.  She was prescribed Reglan in case she needs to take something for nausea.  She discontinued Zofran.  She has not had much nausea with chemotherapy and appears to be handling the  treatment fairly well.  She does have fatigue for 1 to 2 days after treatment.  ALLERGIES:  has  No Known Allergies.  MEDICATIONS:  Current Outpatient Medications  Medication Sig Dispense Refill   albuterol (VENTOLIN HFA) 108 (90 Base) MCG/ACT inhaler Inhale 2 puffs into the lungs every 6 (six) hours as needed for wheezing or shortness of breath. 8 g 2   doxycycline (VIBRA-TABS) 100 MG tablet Take 1 tablet (100 mg total) by mouth 2 (two) times daily. 10 tablet 0   doxycycline (VIBRA-TABS) 100 MG tablet Take 1 tablet (100 mg total) by mouth 2 (two) times daily. 14 tablet 0   hydrocortisone (ANUSOL-HC) 25 MG suppository Place 1 suppository (25 mg total) rectally 2 (two) times daily. 12 suppository 2   hydrocortisone-pramoxine (PROCTOFOAM HC) rectal foam Place 1 applicator rectally 2 (two) times daily. As needed. 20 g 1   lidocaine-prilocaine (EMLA) cream Apply to affected area once 30 g 3   loratadine (CLARITIN) 10 MG tablet Take 1 tablet (10 mg total) by mouth daily. Take day post chemo and then daily for 10 days with each treatment 30 tablet 3   LORazepam (ATIVAN) 0.5 MG tablet Take 1 tablet (0.5 mg total) by mouth at bedtime as needed (Nausea or vomiting). 30 tablet 0   metoCLOPramide (REGLAN) 5 MG tablet Take 1 tablet (5 mg total) by mouth every 6 (six) hours as needed for nausea. 90 tablet 1   ondansetron (ZOFRAN) 8 MG tablet Take 1 tablet (8 mg total) by mouth every 8 (eight) hours as needed for nausea or vomiting. May take starting day 3 of chemotherapy 20 tablet 0   oseltamivir (TAMIFLU) 75 MG capsule Take 1 capsule (75 mg total) by mouth 2 (two) times daily. 10 capsule 0   pramoxine-hydrocortisone cream Apply topically 3 (three) times daily. 57 g 0   prochlorperazine (COMPAZINE) 10 MG tablet Take 1 tablet (10 mg total) by mouth every 6 (six) hours as needed (Nausea or vomiting). 30 tablet 1   traMADol (ULTRAM) 50 MG tablet Take 1-2 tablets (50-100 mg total) by mouth every 6 (six) hours as needed. 20 tablet 0   No current facility-administered medications for this visit.    Facility-Administered Medications Ordered in Other Visits  Medication Dose Route Frequency Provider Last Rate Last Admin   influenza vac split quadrivalent PF (FLUARIX) 0.5 ML injection            influenza vac split quadrivalent PF (FLUARIX) injection 0.5 mL  0.5 mL Intramuscular Once Magrinat, Virgie Dad, MD        PHYSICAL EXAMINATION: ECOG PERFORMANCE STATUS: 1 - Symptomatic but completely ambulatory  Vitals:   09/11/21 0928  BP: 123/78  Pulse: 89  Resp: 17  Temp: 97.6 F (36.4 C)  SpO2: 100%   Filed Weights   09/11/21 0928  Weight: 120 lb 6 oz (54.6 kg)      LABORATORY DATA:  I have reviewed the data as listed CMP Latest Ref Rng & Units 08/31/2021 08/30/2021 08/24/2021  Glucose 70 - 99 mg/dL 130(H) 112(H) 102(H)  BUN 6 - 20 mg/dL '10 14 7  ' Creatinine 0.44 - 1.00 mg/dL 0.68 0.64 0.73  Sodium 135 - 145 mmol/L 141 138 141  Potassium 3.5 - 5.1 mmol/L 3.6 3.8 4.0  Chloride 98 - 111 mmol/L 108 105 109  CO2 22 - 32 mmol/L '23 26 24  ' Calcium 8.9 - 10.3 mg/dL 8.9 9.0 8.7(L)  Total Protein 6.5 - 8.1 g/dL 6.5 - 6.5  Total Bilirubin 0.3 - 1.2 mg/dL 0.4 - 0.5  Alkaline Phos 38 - 126 U/L 70 - 99  AST 15 - 41 U/L 24 - 13(L)  ALT 0 - 44 U/L 25 - 13    Lab Results  Component Value Date   WBC 2.8 (L) 09/11/2021   HGB 10.5 (L) 09/11/2021   HCT 31.8 (L) 09/11/2021   MCV 97.8 09/11/2021   PLT 278 09/11/2021   NEUTROABS 1.5 (L) 09/11/2021    ASSESSMENT & PLAN:  Malignant neoplasm of upper-outer quadrant of right breast in female, estrogen receptor positive ([place]) right breast upper outer quadrant biopsy 06/04/2021 for a clinical mT2 N0 invasive ductal carcinoma, grade 3, estrogen and progesterone receptor weakly positive, HER2 not amplified, with an MIB-1 of 70% CT CAP and Bone Scan: Neg   Treatment Plan: 1. Neo adj chemo with AC X 4 followed by Taxol and Carboplatin weekly X 12 2. Breast Conserving surgery with SLN surgery 3. Adjuvant XRT 4. Adj Anti estrogen  therapy ------------------------------------------------------------------------------------------------------------------- Current Treatment: Completed 4 cycles of AC, today is cycle 4 Taxol with Carboplatin Chemo Toxicities: Fatigue Hemorrhoidal bleeding: I instructed her to take Colace 3 times a day and use the Proctofoam for symptomatic relief We decided to discontinue dexamethasone.  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  She went had a CT scan of the abdomen and pelvis which revealed some kind of narrowing of the rectosigmoid.  I sent a message to Dr. Donne Hazel to evaluate the scan and determine if she needs a sigmoidoscopy or if she needs to be referred to gastroenterology. Leukopenia: ANC 1.5: Okay to treat today.  We lowered the dosage of both Taxol and carboplatin.  If that does not do the job then we will have to give her Granix injections.  She would like to move all of her treatments to Fridays.   RTC weekly for taxol and every other week for follow up with me    No orders of the defined types were placed in this encounter.  The patient has a good understanding of the overall plan. she agrees with it. she will call with any problems that may develop before the next visit here.  Total time spent: 30 mins including face to face time and time spent for planning, charting and coordination of care  Rulon Eisenmenger, MD, MPH 09/11/2021  I, Thana Ates, am acting as scribe for Dr. Nicholas Lose.  I have reviewed the above documentation for accuracy and completeness, and I agree with the above.

## 2021-09-11 ENCOUNTER — Inpatient Hospital Stay (HOSPITAL_BASED_OUTPATIENT_CLINIC_OR_DEPARTMENT_OTHER): Payer: No Typology Code available for payment source | Admitting: Hematology and Oncology

## 2021-09-11 ENCOUNTER — Inpatient Hospital Stay: Payer: No Typology Code available for payment source

## 2021-09-11 ENCOUNTER — Other Ambulatory Visit: Payer: Self-pay

## 2021-09-11 DIAGNOSIS — Z170 Estrogen receptor positive status [ER+]: Secondary | ICD-10-CM

## 2021-09-11 DIAGNOSIS — C50411 Malignant neoplasm of upper-outer quadrant of right female breast: Secondary | ICD-10-CM

## 2021-09-11 DIAGNOSIS — Z20822 Contact with and (suspected) exposure to covid-19: Secondary | ICD-10-CM | POA: Insufficient documentation

## 2021-09-11 DIAGNOSIS — E860 Dehydration: Secondary | ICD-10-CM | POA: Insufficient documentation

## 2021-09-11 DIAGNOSIS — Z79899 Other long term (current) drug therapy: Secondary | ICD-10-CM | POA: Insufficient documentation

## 2021-09-11 DIAGNOSIS — Z853 Personal history of malignant neoplasm of breast: Secondary | ICD-10-CM | POA: Insufficient documentation

## 2021-09-11 DIAGNOSIS — N9489 Other specified conditions associated with female genital organs and menstrual cycle: Secondary | ICD-10-CM | POA: Insufficient documentation

## 2021-09-11 DIAGNOSIS — N939 Abnormal uterine and vaginal bleeding, unspecified: Secondary | ICD-10-CM | POA: Insufficient documentation

## 2021-09-11 DIAGNOSIS — M7918 Myalgia, other site: Secondary | ICD-10-CM | POA: Insufficient documentation

## 2021-09-11 DIAGNOSIS — I10 Essential (primary) hypertension: Secondary | ICD-10-CM | POA: Insufficient documentation

## 2021-09-11 DIAGNOSIS — Z95828 Presence of other vascular implants and grafts: Secondary | ICD-10-CM

## 2021-09-11 LAB — CMP (CANCER CENTER ONLY)
ALT: 77 U/L — ABNORMAL HIGH (ref 0–44)
AST: 66 U/L — ABNORMAL HIGH (ref 15–41)
Albumin: 4.1 g/dL (ref 3.5–5.0)
Alkaline Phosphatase: 82 U/L (ref 38–126)
Anion gap: 6 (ref 5–15)
BUN: 11 mg/dL (ref 6–20)
CO2: 26 mmol/L (ref 22–32)
Calcium: 9.1 mg/dL (ref 8.9–10.3)
Chloride: 108 mmol/L (ref 98–111)
Creatinine: 0.56 mg/dL (ref 0.44–1.00)
GFR, Estimated: >60 mL/min (ref >60)
Glucose, Bld: 102 mg/dL — ABNORMAL HIGH (ref 70–99)
Potassium: 3.9 mmol/L (ref 3.5–5.1)
Sodium: 140 mmol/L (ref 135–145)
Total Bilirubin: 0.6 mg/dL (ref 0.3–1.2)
Total Protein: 6.6 g/dL (ref 6.5–8.1)

## 2021-09-11 LAB — PREGNANCY, URINE: Preg Test, Ur: NEGATIVE

## 2021-09-11 LAB — CBC WITH DIFFERENTIAL (CANCER CENTER ONLY)
Abs Immature Granulocytes: 0.01 10*3/uL (ref 0.00–0.07)
Basophils Absolute: 0 10*3/uL (ref 0.0–0.1)
Basophils Relative: 1 %
Eosinophils Absolute: 0.1 10*3/uL (ref 0.0–0.5)
Eosinophils Relative: 2 %
HCT: 31.8 % — ABNORMAL LOW (ref 36.0–46.0)
Hemoglobin: 10.5 g/dL — ABNORMAL LOW (ref 12.0–15.0)
Immature Granulocytes: 0 %
Lymphocytes Relative: 31 %
Lymphs Abs: 0.9 10*3/uL (ref 0.7–4.0)
MCH: 32.3 pg (ref 26.0–34.0)
MCHC: 33 g/dL (ref 30.0–36.0)
MCV: 97.8 fL (ref 80.0–100.0)
Monocytes Absolute: 0.3 10*3/uL (ref 0.1–1.0)
Monocytes Relative: 10 %
Neutro Abs: 1.5 10*3/uL — ABNORMAL LOW (ref 1.7–7.7)
Neutrophils Relative %: 56 %
Platelet Count: 278 10*3/uL (ref 150–400)
RBC: 3.25 MIL/uL — ABNORMAL LOW (ref 3.87–5.11)
RDW: 16.2 % — ABNORMAL HIGH (ref 11.5–15.5)
WBC Count: 2.8 10*3/uL — ABNORMAL LOW (ref 4.0–10.5)
nRBC: 0 % (ref 0.0–0.2)

## 2021-09-11 MED ORDER — SODIUM CHLORIDE 0.9% FLUSH
10.0000 mL | INTRAVENOUS | Status: DC | PRN
  Administered 2021-09-11: 14:00:00 10 mL

## 2021-09-11 MED ORDER — SODIUM CHLORIDE 0.9% FLUSH
10.0000 mL | Freq: Once | INTRAVENOUS | Status: AC
  Administered 2021-09-11: 09:00:00 10 mL

## 2021-09-11 MED ORDER — HEPARIN SOD (PORK) LOCK FLUSH 100 UNIT/ML IV SOLN
500.0000 [IU] | Freq: Once | INTRAVENOUS | Status: AC | PRN
  Administered 2021-09-11: 14:00:00 500 [IU]

## 2021-09-11 MED ORDER — FAMOTIDINE 20 MG IN NS 100 ML IVPB
20.0000 mg | Freq: Once | INTRAVENOUS | Status: AC
  Administered 2021-09-11: 11:00:00 20 mg via INTRAVENOUS
  Filled 2021-09-11: qty 100

## 2021-09-11 MED ORDER — SODIUM CHLORIDE 0.9 % IV SOLN
105.3000 mg | Freq: Once | INTRAVENOUS | Status: AC
  Administered 2021-09-11: 13:00:00 110 mg via INTRAVENOUS
  Filled 2021-09-11: qty 11

## 2021-09-11 MED ORDER — SODIUM CHLORIDE 0.9 % IV SOLN: Freq: Once | INTRAVENOUS | Status: AC

## 2021-09-11 MED ORDER — SODIUM CHLORIDE 0.9 % IV SOLN
60.0000 mg/m2 | Freq: Once | INTRAVENOUS | Status: AC
  Administered 2021-09-11: 12:00:00 96 mg via INTRAVENOUS
  Filled 2021-09-11: qty 16

## 2021-09-11 MED ORDER — DIPHENHYDRAMINE HCL 50 MG/ML IJ SOLN
25.0000 mg | Freq: Once | INTRAMUSCULAR | Status: AC
  Administered 2021-09-11: 10:00:00 25 mg via INTRAVENOUS
  Filled 2021-09-11: qty 1

## 2021-09-11 MED ORDER — SODIUM CHLORIDE 0.9 % IV SOLN
10.0000 mg | Freq: Once | INTRAVENOUS | Status: AC
  Administered 2021-09-11: 11:00:00 10 mg via INTRAVENOUS
  Filled 2021-09-11: qty 10

## 2021-09-11 NOTE — Patient Instructions (Signed)
[facility]  Discharge Instructions: Thank you for choosing [facility] to provide your oncology and hematology care.   If you have a lab appointment with the Cancer Center, please go directly to the Cancer Center and check in at the registration area.   Wear comfortable clothing and clothing appropriate for easy access to any Portacath or PICC line.   We strive to give you quality time with your provider. You may need to reschedule your appointment if you arrive late (15 or more minutes).  Arriving late affects you and other patients whose appointments are after yours.  Also, if you miss three or more appointments without notifying the office, you may be dismissed from the clinic at the provider's discretion.      For prescription refill requests, have your pharmacy contact our office and allow 72 hours for refills to be completed.    Today you received the following chemotherapy and/or immunotherapy agents: Taxol & Carboplatin   To help prevent nausea and vomiting after your treatment, we encourage you to take your nausea medication as directed.  BELOW ARE SYMPTOMS THAT SHOULD BE REPORTED IMMEDIATELY: *FEVER GREATER THAN 100.4 F (38 C) OR HIGHER *CHILLS OR SWEATING *NAUSEA AND VOMITING THAT IS NOT CONTROLLED WITH YOUR NAUSEA MEDICATION *UNUSUAL SHORTNESS OF BREATH *UNUSUAL BRUISING OR BLEEDING *URINARY PROBLEMS (pain or burning when urinating, or frequent urination) *BOWEL PROBLEMS (unusual diarrhea, constipation, pain near the anus) TENDERNESS IN MOUTH AND THROAT WITH OR WITHOUT PRESENCE OF ULCERS (sore throat, sores in mouth, or a toothache) UNUSUAL RASH, SWELLING OR PAIN  UNUSUAL VAGINAL DISCHARGE OR ITCHING   Items with * indicate a potential emergency and should be followed up as soon as possible or go to the Emergency Department if any problems should occur.  Please show the CHEMOTHERAPY ALERT CARD or IMMUNOTHERAPY ALERT CARD at  check-in to the Emergency Department and triage nurse.  Should you have questions after your visit or need to cancel or reschedule your appointment, please contact [facility]  Dept: [phone]  and follow the prompts.  Office hours are 8:00 a.m. to 4:30 p.m. Monday - Friday. Please note that voicemails left after 4:00 p.m. may not be returned until the following business day.  We are closed weekends and major holidays. You have access to a nurse at all times for urgent questions. Please call the main number to the clinic Dept: [phone] and follow the prompts.   For any non-urgent questions, you may also contact your provider using MyChart. We now offer e-Visits for anyone 18 and older to request care online for non-urgent symptoms. For details visit [email or web address].   Also download the MyChart app! Go to the app store, search "MyChart", open the app, select [facility], and log in with your MyChart username and password.  Due to Covid, a mask is required upon entering the hospital/clinic. If you do not have a mask, one will be given to you upon arrival. For doctor visits, patients may have 1 support person aged 18 or older with them. For treatment visits, patients cannot have anyone with them due to current Covid guidelines and our immunocompromised population.   

## 2021-09-11 NOTE — Assessment & Plan Note (Signed)
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 Neo adj chemo with AC X 4 followed by Taxol and Carboplatin weekly X 12 2. Breast Conserving surgery with SLN surgery 3. Adjuvant XRT 4. Adj Anti estrogen therapy ------------------------------------------------------------------------------------------------------------------- Current Treatment: Completed 4 cycles of AC, today is cycle 4 Taxol with Carboplatin Chemo Toxicities: 1. Fatigue 2. Hemorrhoidal bleeding: I instructed her to take Colace 3 times a day and use the Proctofoam for symptomatic relief 3. We decided to discontinue dexamethasone.  She will take antinausea medications on an as-needed basis 4. Chemo induced anemia: Hemoglobin was 10.2 on 08/24/2021  RTC weekly for taxol and every other week for follow up with me

## 2021-09-12 ENCOUNTER — Emergency Department (HOSPITAL_COMMUNITY)
Admission: EM | Admit: 2021-09-12 | Discharge: 2021-09-12 | Disposition: A | Discharge status: Home / Self Care | Payer: No Typology Code available for payment source | Source: Home / Self Care | Attending: Emergency Medicine | Admitting: Emergency Medicine

## 2021-09-12 ENCOUNTER — Emergency Department (HOSPITAL_COMMUNITY): Payer: No Typology Code available for payment source

## 2021-09-12 DIAGNOSIS — E860 Dehydration: Secondary | ICD-10-CM

## 2021-09-12 DIAGNOSIS — M791 Myalgia, unspecified site: Secondary | ICD-10-CM

## 2021-09-12 LAB — COMPREHENSIVE METABOLIC PANEL
ALT: 82 U/L — ABNORMAL HIGH (ref 0–44)
AST: 69 U/L — ABNORMAL HIGH (ref 15–41)
Albumin: 4.3 g/dL (ref 3.5–5.0)
Alkaline Phosphatase: 85 U/L (ref 38–126)
Anion gap: 5 (ref 5–15)
BUN: 10 mg/dL (ref 6–20)
CO2: 20 mmol/L — ABNORMAL LOW (ref 22–32)
Calcium: 8.4 mg/dL — ABNORMAL LOW (ref 8.9–10.3)
Chloride: 111 mmol/L (ref 98–111)
Creatinine, Ser: 0.54 mg/dL (ref 0.44–1.00)
GFR, Estimated: >60 mL/min (ref >60)
Glucose, Bld: 116 mg/dL — ABNORMAL HIGH (ref 70–99)
Potassium: 3.3 mmol/L — ABNORMAL LOW (ref 3.5–5.1)
Sodium: 136 mmol/L (ref 135–145)
Total Bilirubin: 0.8 mg/dL (ref 0.3–1.2)
Total Protein: 7.1 g/dL (ref 6.5–8.1)

## 2021-09-12 LAB — CBC WITH DIFFERENTIAL/PLATELET
Abs Immature Granulocytes: 0.03 10*3/uL (ref 0.00–0.07)
Basophils Absolute: 0 10*3/uL (ref 0.0–0.1)
Basophils Relative: 0 %
Eosinophils Absolute: 0 10*3/uL (ref 0.0–0.5)
Eosinophils Relative: 0 %
HCT: 33.4 % — ABNORMAL LOW (ref 36.0–46.0)
Hemoglobin: 11 g/dL — ABNORMAL LOW (ref 12.0–15.0)
Immature Granulocytes: 0 %
Lymphocytes Relative: 2 %
Lymphs Abs: 0.2 10*3/uL — ABNORMAL LOW (ref 0.7–4.0)
MCH: 32.9 pg (ref 26.0–34.0)
MCHC: 32.9 g/dL (ref 30.0–36.0)
MCV: 100 fL (ref 80.0–100.0)
Monocytes Absolute: 0.2 10*3/uL (ref 0.1–1.0)
Monocytes Relative: 2 %
Neutro Abs: 9.6 10*3/uL — ABNORMAL HIGH (ref 1.7–7.7)
Neutrophils Relative %: 96 %
Platelets: 275 10*3/uL (ref 150–400)
RBC: 3.34 MIL/uL — ABNORMAL LOW (ref 3.87–5.11)
RDW: 16 % — ABNORMAL HIGH (ref 11.5–15.5)
WBC: 9.9 10*3/uL (ref 4.0–10.5)
nRBC: 0 % (ref 0.0–0.2)

## 2021-09-12 LAB — PROTIME-INR
INR: 0.9 (ref 0.8–1.2)
Prothrombin Time: 12.4 seconds (ref 11.4–15.2)

## 2021-09-12 LAB — APTT: aPTT: 23 seconds — ABNORMAL LOW (ref 24–36)

## 2021-09-12 LAB — URINALYSIS, ROUTINE W REFLEX MICROSCOPIC
Bilirubin Urine: NEGATIVE
Glucose, UA: NEGATIVE mg/dL
Hgb urine dipstick: NEGATIVE
Ketones, ur: NEGATIVE mg/dL
Leukocytes,Ua: NEGATIVE
Nitrite: NEGATIVE
Protein, ur: NEGATIVE mg/dL
Specific Gravity, Urine: 1.017 (ref 1.005–1.030)
pH: 6 (ref 5.0–8.0)

## 2021-09-12 LAB — RESP PANEL BY RT-PCR (FLU A&B, COVID) ARPGX2
Influenza A by PCR: NEGATIVE
Influenza B by PCR: NEGATIVE
SARS Coronavirus 2 by RT PCR: NEGATIVE

## 2021-09-12 LAB — LACTIC ACID, PLASMA
Lactic Acid, Venous: 2.9 mmol/L (ref 0.5–1.9)
Lactic Acid, Venous: 3.1 mmol/L (ref 0.5–1.9)
Lactic Acid, Venous: 1.2 mmol/L (ref 0.5–1.9)

## 2021-09-12 LAB — HCG, QUANTITATIVE, PREGNANCY: hCG, Beta Chain, Quant, S: 1 m[IU]/mL (ref <5)

## 2021-09-12 LAB — CK: Total CK: 40 U/L (ref 38–234)

## 2021-09-12 IMAGING — DX DG CHEST 1V PORT
1 series · 1 of 1 positions shown · non-contrast
Comparison: [DATE]

CLINICAL DATA: Feeling achy with shivering, status post chemo
treatment today.

EXAM:
PORTABLE CHEST 1 VIEW

[chest ap]
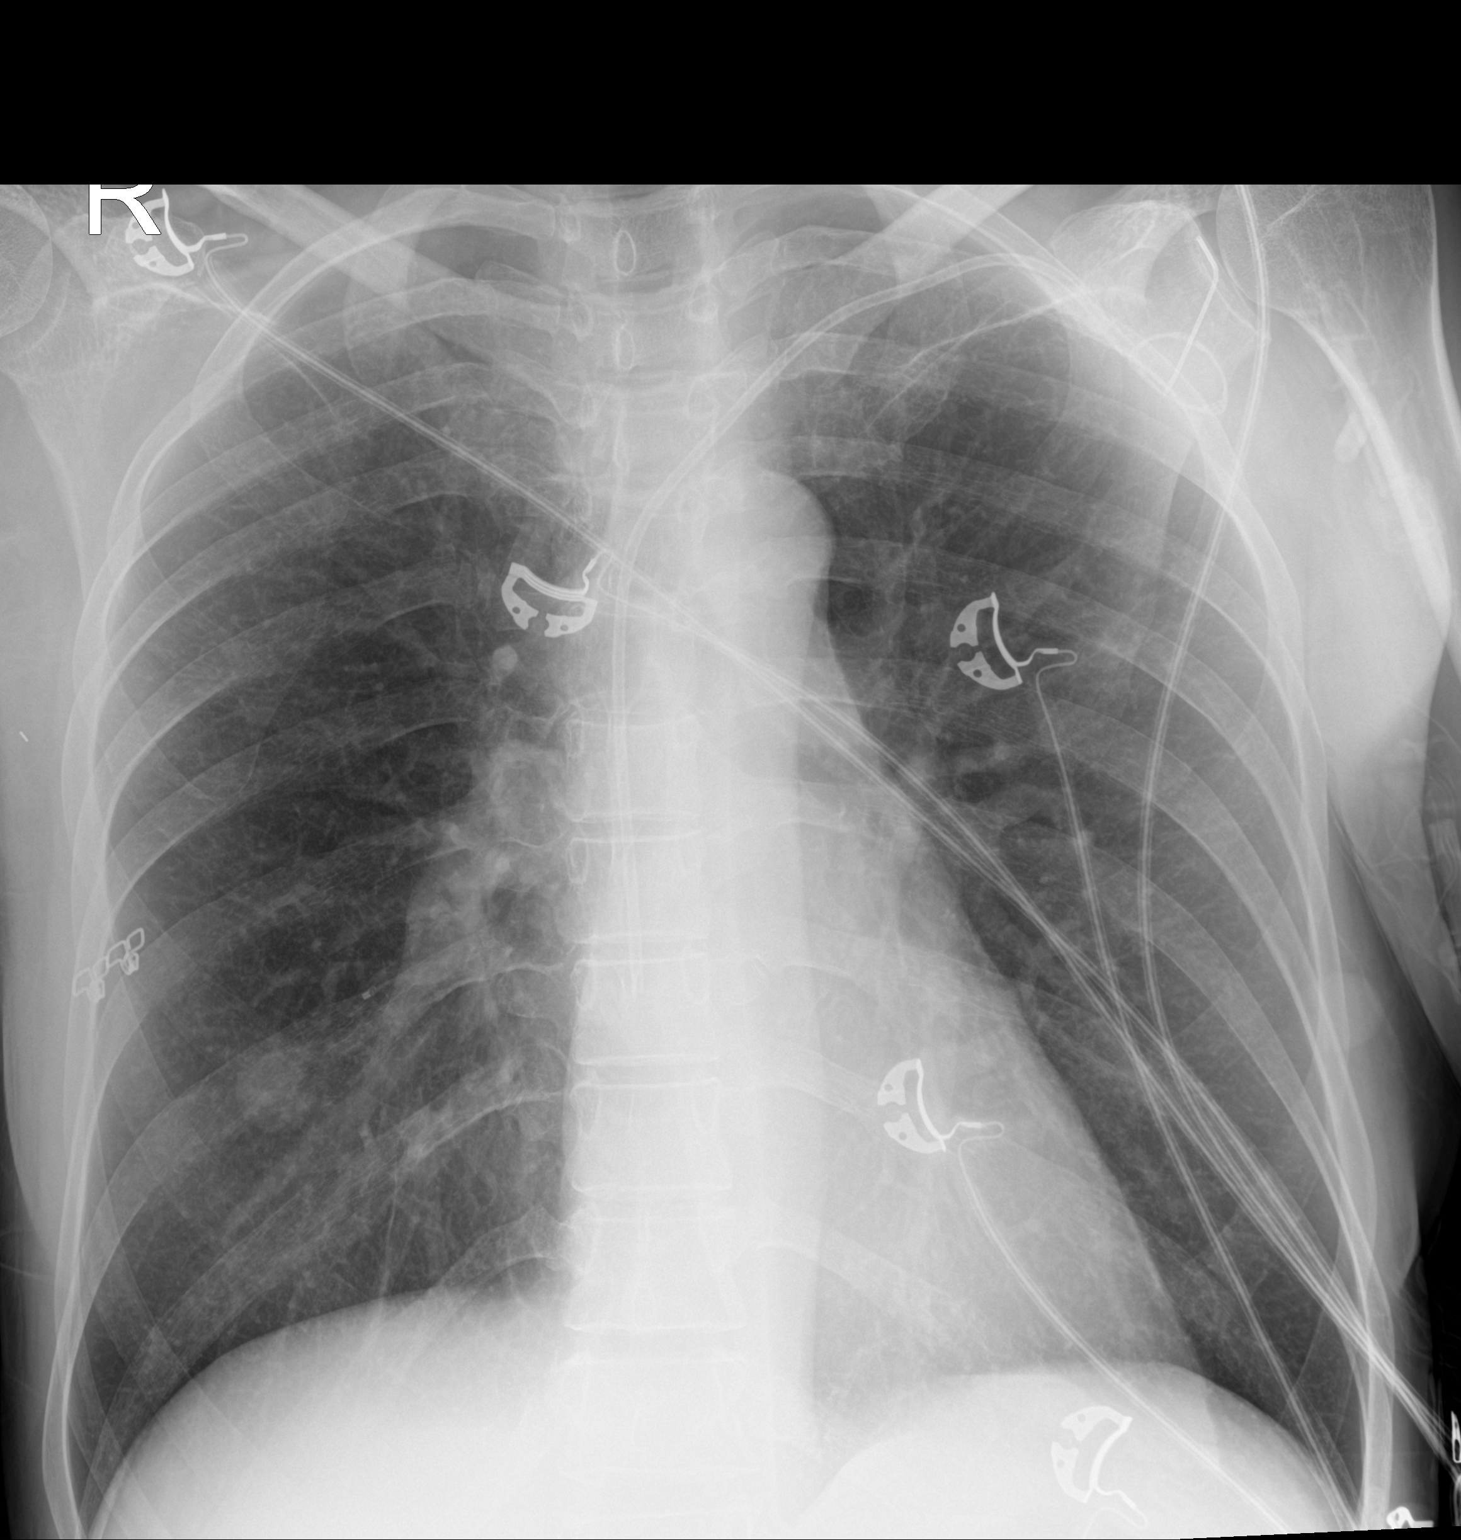

[1 of 1 positions shown; findings below may reference images not displayed]

FINDINGS: There is stable left-sided venous Port-A-Cath positioning. Mild
linear atelectasis is seen along the left upper lobe. There is no
evidence of acute infiltrate, pleural effusion or pneumothorax. The
heart size and mediastinal contours are within normal limits.
Bilateral nipple shadows are noted. The visualized skeletal
structures are unremarkable.
IMPRESSION: No acute cardiopulmonary disease.

## 2021-09-12 MED ORDER — LACTATED RINGERS IV BOLUS (SEPSIS)
1000.0000 mL | Freq: Once | INTRAVENOUS | Status: AC
  Administered 2021-09-12: 1000 mL via INTRAVENOUS

## 2021-09-12 MED ORDER — VANCOMYCIN HCL IN DEXTROSE 1-5 GM/200ML-% IV SOLN: 1000.0000 mg | Freq: Once | INTRAVENOUS | Status: DC

## 2021-09-12 MED ORDER — LACTATED RINGERS IV SOLN: INTRAVENOUS | Status: DC

## 2021-09-12 MED ORDER — SODIUM CHLORIDE 0.9 % IV BOLUS
1000.0000 mL | Freq: Once | INTRAVENOUS | Status: AC
  Administered 2021-09-12: 1000 mL via INTRAVENOUS

## 2021-09-12 MED ORDER — ACETAMINOPHEN 325 MG PO TABS
650.0000 mg | ORAL_TABLET | Freq: Once | ORAL | Status: AC
  Administered 2021-09-12: 650 mg via ORAL
  Filled 2021-09-12: qty 2

## 2021-09-12 MED ORDER — LACTATED RINGERS IV BOLUS (SEPSIS)
500.0000 mL | Freq: Once | INTRAVENOUS | Status: AC
  Administered 2021-09-12: 500 mL via INTRAVENOUS

## 2021-09-12 MED ORDER — HEPARIN SOD (PORK) LOCK FLUSH 100 UNIT/ML IV SOLN
500.0000 [IU] | Freq: Once | INTRAVENOUS | Status: AC
  Administered 2021-09-12: 500 [IU]
  Filled 2021-09-12: qty 5

## 2021-09-12 MED ORDER — LACTATED RINGERS IV BOLUS (SEPSIS): 250.0000 mL | Freq: Once | INTRAVENOUS | Status: DC

## 2021-09-12 MED ORDER — SODIUM CHLORIDE 0.9 % IV SOLN
2.0000 g | Freq: Once | INTRAVENOUS | Status: AC
  Administered 2021-09-12: 2 g via INTRAVENOUS
  Filled 2021-09-12: qty 2

## 2021-09-12 NOTE — ED Provider Notes (Signed)
[facility] DEPT Provider Note   CSN: [id number] Arrival date & time: 09/11/21  2346     History Chief Complaint  Patient presents with   Generalized Body Aches    Ebony Lamb is a 45 y.o. female.  The history is provided by the patient.  Weakness Severity:  Moderate Onset quality:  Gradual Timing:  Constant Progression:  Worsening Chronicity:  New Relieved by:  Nothing Worsened by:  Nothing Associated symptoms: fever, myalgias, nausea and vomiting   Associated symptoms: no cough, no diarrhea, no headaches and no shortness of breath   Patient with history of breast cancer on chemo presents with feeling feverish, achy and myalgias.  She had chemotherapy earlier today, several hours later began having all the symptoms.  She has otherwise been at her baseline just got back from a family trip. She reports her daughter has been sick with a fever recently. She had an episode of vomiting. No significant cough, no headache    Past Medical History:  Diagnosis Date   Abnormal Pap smear 09/13/2000   Allergy 02/2020   Seasonal   Cancer ([place])    Depression    history of depression- denies currently   H/O varicella    Hypertension 09/14/2011   post partum- meds x 1 mo   No pertinent past medical history    Preterm labor     Patient Active Problem List   Diagnosis Date Noted   Port-A-Cath in place 08/10/2021   Genetic testing 06/17/2021   Family history of breast cancer 06/10/2021   Family history of ovarian cancer 06/10/2021   Family history of BRCA gene mutation 06/10/2021   Malignant neoplasm of upper-outer quadrant of right breast in female, estrogen receptor positive ([place]) 06/08/2021   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 Apgars 9/9.   CESAREAN SECTION N/A 03/22/2014   Procedure: Repeat CESAREAN SECTION;  Surgeon: Alwyn Pea, MD;  Location: [facility];  Service: Obstetrics;  Laterality: N/A;   COSMETIC SURGERY  11/22/2017   Abdominoplasty   FOOT SURGERY     FRACTURE SURGERY  08/1999   Repair of 5th metatarsal in left foot   HERNIA REPAIR  11/22/2017   PORTACATH PLACEMENT N/A 06/24/2021   Procedure: INSERTION PORT-A-CATH;  Surgeon: Coralie Keens, MD;  Location: [facility];  Service: General;  Laterality: N/A;   TUBAL LIGATION  11/22/2017   Tubes were completely removed   WISDOM TOOTH EXTRACTION       OB History     Gravida  3   Para  2   Term  2   Preterm      AB  1   Living  2      SAB      IAB  1   Ectopic      Multiple      Live Births  2  Family History  Problem Relation Age of Onset   Breast cancer Mother 65   Osteopenia Mother    Arthritis Mother    Other Mother        BRCA2 gene mutation   Hypertension Father    Kidney disease Father    Depression Father    Hyperlipidemia Father    Melanoma Father 27   Hypertension Brother    Hyperlipidemia Brother    Breast cancer Maternal Grandmother        dx. late 30s/40s   Heart disease Maternal Grandfather    Other Maternal Grandfather        BRCA2 gene mutation   Ovarian cancer Paternal Grandmother        dx. 52s   Stroke Paternal Grandfather    Birth defects Son    Anesthesia problems Neg Hx    Diabetes Neg Hx     Social History   Tobacco Use   Smoking status: Never   Smokeless tobacco: Never   Vaping Use   Vaping Use: Never used  Substance Use Topics   Alcohol use: Yes    Alcohol/week: 7.0 standard drinks    Types: 7 Standard drinks or equivalent per week    Comment: I stopped drinking alcohol after finding the lump in my brea   Drug use: No    Home Medications Prior to Admission medications   Medication Sig Start Date End Date Taking? Authorizing Provider  lidocaine-prilocaine (EMLA) cream Apply to affected area once 06/10/21   Magrinat, Virgie Dad, MD  loratadine (CLARITIN) 10 MG tablet Take 1 tablet (10 mg total) by mouth daily. Take day post chemo and then daily for 10 days with each treatment 06/23/21   Magrinat, Virgie Dad, MD  LORazepam (ATIVAN) 0.5 MG tablet Take 1 tablet (0.5 mg total) by mouth at bedtime as needed (Nausea or vomiting). 06/10/21   Magrinat, Virgie Dad, MD  metoCLOPramide (REGLAN) 5 MG tablet Take 1 tablet (5 mg total) by mouth every 6 (six) hours as needed for nausea. 09/01/21   Magrinat, Virgie Dad, MD  ondansetron (ZOFRAN) 8 MG tablet Take 1 tablet (8 mg total) by mouth every 8 (eight) hours as needed for nausea or vomiting. May take starting day 3 of chemotherapy 07/23/21   Magrinat, Virgie Dad, MD  prochlorperazine (COMPAZINE) 10 MG tablet Take 1 tablet (10 mg total) by mouth every 6 (six) hours as needed (Nausea or vomiting). 06/10/21   Magrinat, Virgie Dad, MD  ferrous sulfate (FERROUSUL) 325 (65 FE) MG tablet Take 1 tablet (325 mg total) by mouth 2 (two) times daily. 11/17/11 11/26/11  Gypsy Lore, NP    Allergies    Patient has no known allergies.  Review of Systems   Review of Systems  Constitutional:  Positive for fatigue and fever.  Respiratory:  Negative for cough and shortness of breath.   Gastrointestinal:  Positive for nausea and vomiting. Negative for diarrhea.  Musculoskeletal:  Positive for myalgias.  Neurological:  Positive for weakness. Negative for headaches.  All other systems reviewed and are negative.  Physical Exam Updated Vital  Signs BP (!) 85/50    Pulse 97    Temp 99.3 F (37.4 C) (Oral)    Resp 18    Ht 1.676 m ('5\' 6"' )    Wt 54.4 kg    SpO2 98%    BMI 19.37 kg/m   Physical Exam CONSTITUTIONAL: Well developed ill-appearing HEAD: Normocephalic/atraumatic EYES: EOMI/PERRL ENMT: Mucous membranes moist, mask in place NECK: supple  no meningeal signs SPINE/BACK:entire spine nontender CV: S1/S2 noted, tachycardic LUNGS: Lungs are clear to auscultation bilaterally, no apparent distress ABDOMEN: soft, nontender GU:no cva tenderness NEURO: Pt is awake/alert/appropriate, moves all extremitiesx4.  No facial droop.   EXTREMITIES: full ROM SKIN: warm, color normal PSYCH: Anxious  ED Results / Procedures / Treatments   Labs (all labs ordered are listed, but only abnormal results are displayed) Labs Reviewed  LACTIC ACID, PLASMA - Abnormal; Notable for the following components:      Result Value   Lactic Acid, Venous 2.9 (*)    All other components within normal limits  LACTIC ACID, PLASMA - Abnormal; Notable for the following components:   Lactic Acid, Venous 3.1 (*)    All other components within normal limits  COMPREHENSIVE METABOLIC PANEL - Abnormal; Notable for the following components:   Potassium 3.3 (*)    CO2 20 (*)    Glucose, Bld 116 (*)    Calcium 8.4 (*)    AST 69 (*)    ALT 82 (*)    All other components within normal limits  CBC WITH DIFFERENTIAL/PLATELET - Abnormal; Notable for the following components:   RBC 3.34 (*)    Hemoglobin 11.0 (*)    HCT 33.4 (*)    RDW 16.0 (*)    Neutro Abs 9.6 (*)    Lymphs Abs 0.2 (*)    All other components within normal limits  APTT - Abnormal; Notable for the following components:   aPTT 23 (*)    All other components within normal limits  RESP PANEL BY RT-PCR (FLU A&B, COVID) ARPGX2  CULTURE, BLOOD (ROUTINE X 2)  CULTURE, BLOOD (ROUTINE X 2)  URINE CULTURE  PROTIME-INR  URINALYSIS, ROUTINE W REFLEX MICROSCOPIC  CK  HCG, QUANTITATIVE, PREGNANCY   LACTIC ACID, PLASMA    EKG EKG Interpretation  Date/Time:  Saturday September 12 2021 00:59:24 EST Ventricular Rate:  86 PR Interval:  151 QRS Duration: 87 QT Interval:  446 QTC Calculation: 534 R Axis:   76 Text Interpretation: Sinus rhythm Anteroseptal infarct, age indeterminate Prolonged QT interval No previous ECGs available Confirmed by Ripley Fraise ([id number]) on 09/12/2021 1:05:41 AM  Radiology DG Chest Port 1 View  Result Date: 09/12/2021 CLINICAL DATA:  Feeling achy with shivering, status post chemo treatment today. EXAM: PORTABLE CHEST 1 VIEW COMPARISON:  August 14, 2021 FINDINGS: There is stable left-sided venous Port-A-Cath positioning. Mild linear atelectasis is seen along the left upper lobe. There is no evidence of acute infiltrate, pleural effusion or pneumothorax. The heart size and mediastinal contours are within normal limits. Bilateral nipple shadows are noted. The visualized skeletal structures are unremarkable. IMPRESSION: No acute cardiopulmonary disease. Electronically Signed   By: Virgina Norfolk M.D.   On: 09/12/2021 01:59    Procedures .Critical Care Performed by: Ripley Fraise, MD Authorized by: Ripley Fraise, MD   Critical care provider statement:    Critical care time (minutes):  60   Critical care start time:  09/12/2021 12:40 AM   Critical care end time:  09/12/2021 1:40 AM   Critical care time was exclusive of:  Separately billable procedures and treating other patients   Critical care was necessary to treat or prevent imminent or life-threatening deterioration of the following conditions:  Sepsis, shock, dehydration and metabolic crisis   Critical care was time spent personally by me on the following activities:  Development of treatment plan with patient or surrogate, evaluation of patient's response to treatment, examination of patient, re-evaluation  of patient's condition, pulse oximetry, ordering and review of radiographic studies,  ordering and review of laboratory studies, ordering and performing treatments and interventions and review of old charts   I assumed direction of critical care for this patient from another provider in my specialty: no     Medications Ordered in ED Medications  lactated ringers infusion ( Intravenous New Bag/Given 09/12/21 0321)  lactated ringers bolus 1,000 mL (0 mLs Intravenous Stopped 09/12/21 0233)    And  lactated ringers bolus 500 mL (0 mLs Intravenous Stopped 09/12/21 0312)  ceFEPIme (MAXIPIME) 2 g in sodium chloride 0.9 % 100 mL IVPB (0 g Intravenous Stopped 09/12/21 0157)  acetaminophen (TYLENOL) tablet 650 mg (650 mg Oral Given 09/12/21 0104)  sodium chloride 0.9 % bolus 1,000 mL (0 mLs Intravenous Stopped 09/12/21 0621)  acetaminophen (TYLENOL) tablet 650 mg (650 mg Oral Given 09/12/21 0516)    ED Course  I have reviewed the triage vital signs and the nursing notes.  Pertinent labs & imaging results that were available during my care of the patient were reviewed by me and considered in my medical decision making (see chart for details).  Clinical Course as of 09/12/21 3419  Sat Sep 12, 2021  0039 Patient with history of breast cancer, on chemotherapy known to be neutropenic presents with feeling feverish and chills.  Code sepsis has been called as I suspect she has had neutropenic fever and will require aggressive fluid and antibiotics [DW]  0152 No signs of neutropenia on labs.  Patient is feeling improved.  Work-up is pending at this time [DW]  0212 Patient is feeling improved.  Labs pending at this time [DW]  [phone] Patient continues to improve.  She did have a red rash appeared after receiving fluids and medications.  However no itching, no swelling, no angioedema.  No shortness of breath. She has tolerated antibiotics previously.  Will defer any treatment as I'm not convinced this is a true allergy [DW]  0502 Overall patient appears improved, but lactic acid is still elevated  after IV fluids.  Patient does report mild myalgias are returning.  Rash in her chest is remained stable.  There is no rash on her hands.  She did not have this prior to arrival Plan will be to give another liter of fluid, recheck lactic acid and if still elevated she will need to be admitted. [DW]  9798 Lactic acid is now normal. Vital signs are improved She feels improved.  The rash is mildly improved as well. She feels comfortable for discharge home.  She will follow-up with her oncologist.  We discussed tricked return precautions. [DW]    Clinical Course User Index [DW] Ripley Fraise, MD   MDM Rules/Calculators/A&P                         This patient presents to the ED for concern of fever/myalgias, this involves an extensive number of treatment options, and is a complaint that carries with it a high risk of complications and morbidity.  The differential diagnosis includes sepsis, pneumonia, COVID, Flu, UTI, bacteremia, meningitis, neutropenia   Co morbidities that complicate the patient evaluation  Breast cancer   Additional history obtained:  Additional history obtained from spouse is at bedside    Lab Tests:  I Ordered, and personally interpreted labs.  The pertinent results include: Dehydration noted   Imaging Studies ordered:  I ordered imaging studies including chest xray  I independently visualized and  interpreted imaging which showed no acute findings I agree with the radiologist interpretation   Cardiac Monitoring:  The patient was maintained on a cardiac monitor.  I personally viewed and interpreted the cardiac monitored which showed an underlying rhythm of: sinus rhythm   Medicines ordered and prescription drug management:  I ordered medication including IV fluids and antibiotics  for sepsis  Reevaluation of the patient after these medicines showed that the patient improved I have reviewed the patients home medicines and have made adjustments as  needed    Critical Interventions:  Multiple liters of IV fluid    Reevaluation:  After the interventions noted above, I reevaluated the patient and found that they have :improved    Dispostion:  After consideration of the diagnostic results and the patients response to treatment, I feel that the patent would benefit from patient feels much improved and feels appropriate for discharge home..    Final Clinical Impression(s) / ED Diagnoses Final diagnoses:  Myalgia  Dehydration    Rx / DC Orders ED Discharge Orders     None        Ripley Fraise, MD 09/12/21 [phone]

## 2021-09-12 NOTE — Progress Notes (Signed)
A consult was received from an ED physician for vancomycin and cefepime per pharmacy dosing.  The patient's profile has been reviewed for ht/wt/allergies/indication/available labs.   A one time order has been placed for vancomycin 1gm and cefepime 2gm.    Further antibiotics/pharmacy consults should be ordered by admitting physician if indicated.                       Thank you, Dolly Rias RPh 09/12/2021, 3:02 AM

## 2021-09-12 NOTE — ED Notes (Signed)
Red rash noted to patient's chest and neck after infusion of Cefepime. Dr. Christy Gentles informed via epic secure chat. Pt in no acute distress at this time.

## 2021-09-12 NOTE — ED Notes (Signed)
Dr. Wickline at bedside.  

## 2021-09-12 NOTE — ED Triage Notes (Signed)
Patient reports having chemo today, began feeling achy around 10pm, shivering and teeth chattering around 11pm.

## 2021-09-12 NOTE — Sepsis Progress Note (Signed)
Monitoring for the code sepsis protocol. °

## 2021-09-12 NOTE — Sepsis Progress Note (Addendum)
Notified Dr. Christy Gentles of need to order repeat lactic acid.  Per MD, will receive IVF first then repeat lactic.

## 2021-09-13 LAB — URINE CULTURE: Culture: NO GROWTH

## 2021-09-15 ENCOUNTER — Other Ambulatory Visit: Payer: Self-pay | Admitting: *Deleted

## 2021-09-15 DIAGNOSIS — R935 Abnormal findings on diagnostic imaging of other abdominal regions, including retroperitoneum: Secondary | ICD-10-CM

## 2021-09-17 ENCOUNTER — Other Ambulatory Visit: Payer: Self-pay

## 2021-09-17 ENCOUNTER — Other Ambulatory Visit: Payer: No Typology Code available for payment source

## 2021-09-17 ENCOUNTER — Encounter: Payer: Self-pay | Admitting: Physician Assistant

## 2021-09-17 DIAGNOSIS — C50411 Malignant neoplasm of upper-outer quadrant of right female breast: Secondary | ICD-10-CM

## 2021-09-17 LAB — CULTURE, BLOOD (ROUTINE X 2)
Culture: NO GROWTH
Culture: NO GROWTH
Special Requests: ADEQUATE
Special Requests: ADEQUATE

## 2021-09-17 MED FILL — Dexamethasone Sodium Phosphate Inj 100 MG/10ML: INTRAMUSCULAR | Qty: 1 | Status: AC

## 2021-09-17 NOTE — Progress Notes (Signed)
Patient Care Team: Antony Contras, MD as PCP - General (Family Medicine) Coralie Keens, MD as Consulting Physician (General Surgery) Magrinat, Virgie Dad, MD as Consulting Physician (Oncology) Eppie Gibson, MD as Attending Physician (Radiation Oncology) Rockwell Germany, RN as Oncology Nurse Navigator Mauro Kaufmann, RN as Oncology Nurse Navigator Rozetta Nunnery, MD (Inactive) as Consulting Physician (Otolaryngology) Sanjuana Kava, MD as Referring Physician (Obstetrics and Gynecology) [name], MD as Consulting Physician (Plastic Surgery)  DIAGNOSIS:    ICD-10-CM   1. Malignant neoplasm of upper-outer quadrant of right breast in female, estrogen receptor positive ([place])  C50.411    Z17.0       SUMMARY OF ONCOLOGIC HISTORY: Oncology History  Malignant neoplasm of upper-outer quadrant of right breast in female, estrogen receptor positive ([place])  06/04/2021 Initial Diagnosis   status post right breast upper outer quadrant biopsy 06/04/2021 for a clinical mT2 N0 invasive ductal carcinoma, grade 3, estrogen and progesterone receptor inadequate (functionally triple negative with HER2 not amplified), with an MIB-1 of 70%             (A) chest CT with contrast 06/22/2021 shows right breast mass, no other lesions of concern; pulmonary nodules all less than 5 mm noted, likely benign             (B) bone scan 06/22/2021 negative for metastases; consistent with sacroiliitis   06/10/2021 Cancer Staging   Staging form: Breast, AJCC 8th Edition - Clinical stage from 06/10/2021: Stage IIA (cT2, cN0, cM0, G3, ER+, PR+, HER2-) - Signed by Chauncey Cruel, MD on 06/10/2021 Stage prefix: Initial diagnosis Histologic grading system: 3 grade system Laterality: Right Staged by: Pathologist and managing physician Stage used in treatment planning: Yes National guidelines used in treatment planning: Yes Type of national guideline used in treatment planning: NCCN    06/16/2021 Genetic  Testing   [facility] CancerNext-Expanded was negative. No pathogenic variants were identified. 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 Report date is 06/19/2021.  The CancerNext-Expanded gene panel offered by [facility] and includes sequencing, rearrangement, and RNA analysis for the following 77 genes: AIP, ALK, APC, ATM, AXIN2, BAP1, BARD1, BLM, BMPR1A, BRCA1, BRCA2, BRIP1, CDC73, CDH1, CDK4, CDKN1B, CDKN2A, CHEK2, CTNNA1, DICER1, FANCC, FH, FLCN, GALNT12, KIF1B, LZTR1, MAX, MEN1, MET, MLH1, MSH2, MSH3, MSH6, MUTYH, NBN, NF1, NF2, NTHL1, PALB2, PHOX2B, PMS2, POT1, PRKAR1A, PTCH1, PTEN, RAD51C, RAD51D, RB1, RECQL, RET, SDHA, SDHAF2, SDHB, SDHC, SDHD, SMAD4, SMARCA4, SMARCB1, SMARCE1, STK11, SUFU, TMEM127, TP53, TSC1, TSC2, VHL and XRCC2 (sequencing and deletion/duplication); EGFR, EGLN1, HOXB13, KIT, MITF, PDGFRA, POLD1, and POLE (sequencing only); EPCAM and GREM1 (deletion/duplication only).    06/25/2021 -  Neo-Adjuvant Chemotherapy   neoadjuvant chemotherapy consisting of doxorubicin and cyclophosphamide in dose dense fashion x4 started 0/13/2022, completed 08/10/2021, to be followed by weekly carboplatin and paclitaxel x12 starting 08/24/2021     CHIEF COMPLIANT: Follow-up of neoadjuvant chemotherapy  INTERVAL HISTORY: Ebony Lamb is a 46 y.o. with above-mentioned history of weakly estrogen receptor positive breast cancer.  She is currently on neoadjuvant chemotherapy. She completed dose dense Adriamycin and Cytoxan and is currently on Taxol and carboplatin. She presents to the clinic today for follow-up.   ALLERGIES:  has No Known Allergies.  MEDICATIONS:  Current Outpatient Medications  Medication Sig Dispense Refill   ciprofloxacin (CIPRO) 500 MG tablet Take 1 tablet (500 mg total) by mouth 2 (two) times daily. 14 tablet 0   lidocaine-prilocaine (EMLA) cream Apply to  affected area once 30 g 3   loratadine (CLARITIN) 10 MG tablet Take 1 tablet (10 mg  total) by mouth daily. Take day post chemo and then daily for 10 days with each treatment 30 tablet 3   LORazepam (ATIVAN) 0.5 MG tablet Take 1 tablet (0.5 mg total) by mouth at bedtime as needed (Nausea or vomiting). 30 tablet 0   metoCLOPramide (REGLAN) 5 MG tablet Take 1 tablet (5 mg total) by mouth every 6 (six) hours as needed for nausea. 90 tablet 1   ondansetron (ZOFRAN) 8 MG tablet Take 1 tablet (8 mg total) by mouth every 8 (eight) hours as needed for nausea or vomiting. May take starting day 3 of chemotherapy 20 tablet 0   prochlorperazine (COMPAZINE) 10 MG tablet Take 1 tablet (10 mg total) by mouth every 6 (six) hours as needed (Nausea or vomiting). 30 tablet 1   No current facility-administered medications for this visit.   Facility-Administered Medications Ordered in Other Visits  Medication Dose Route Frequency Provider Last Rate Last Admin   CARBOplatin (PARAPLATIN) 110 mg in sodium chloride 0.9 % 100 mL chemo infusion  110 mg Intravenous Once Nicholas Lose, MD       heparin lock flush 100 unit/mL  500 Units Intracatheter Once PRN Nicholas Lose, MD       influenza vac split quadrivalent PF (FLUARIX) 0.5 ML injection            influenza vac split quadrivalent PF (FLUARIX) injection 0.5 mL  0.5 mL Intramuscular Once Magrinat, Virgie Dad, MD       PACLitaxel (TAXOL) 96 mg in sodium chloride 0.9 % 250 mL chemo infusion (</= 60m/m2)  60 mg/m2 (Treatment Plan Recorded) Intravenous Once GNicholas Lose MD       sodium chloride flush (NS) 0.9 % injection 10 mL  10 mL Intracatheter PRN GNicholas Lose MD        PHYSICAL EXAMINATION: ECOG PERFORMANCE STATUS: 1 - Symptomatic but completely ambulatory  Vitals:   09/18/21 1036  BP: 109/77  Pulse: 86  Temp: (!) 97.5 F (36.4 C)  SpO2: 100%   Filed Weights   09/18/21 1036  Weight: 118 lb 1.6 oz (53.6 kg)    BREAST: No palpable masses or nodules in either right or left breasts. No palpable axillary supraclavicular or infraclavicular  adenopathy no breast tenderness or nipple discharge. (exam performed in the presence of a chaperone)  LABORATORY DATA:  I have reviewed the data as listed CMP Latest Ref Rng & Units 09/18/2021 09/12/2021 09/11/2021  Glucose 70 - 99 mg/dL 102(H) 116(H) 102(H)  BUN 6 - 20 mg/dL '11 10 11  ' Creatinine 0.44 - 1.00 mg/dL 0.60 0.54 0.56  Sodium 135 - 145 mmol/L 139 136 140  Potassium 3.5 - 5.1 mmol/L 3.9 3.3(L) 3.9  Chloride 98 - 111 mmol/L 106 111 108  CO2 22 - 32 mmol/L 26 20(L) 26  Calcium 8.9 - 10.3 mg/dL 9.5 8.4(L) 9.1  Total Protein 6.5 - 8.1 g/dL 6.9 7.1 6.6  Total Bilirubin 0.3 - 1.2 mg/dL 0.5 0.8 0.6  Alkaline Phos 38 - 126 U/L 86 85 82  AST 15 - 41 U/L 46(H) 69(H) 66(H)  ALT 0 - 44 U/L 71(H) 82(H) 77(H)    Lab Results  Component Value Date   WBC 3.4 (L) 09/18/2021   HGB 10.3 (L) 09/18/2021   HCT 30.6 (L) 09/18/2021   MCV 97.8 09/18/2021   PLT 218 09/18/2021   NEUTROABS 1.6 (L) 09/18/2021  ASSESSMENT & PLAN:  Malignant neoplasm of upper-outer quadrant of right breast in female, estrogen receptor positive ([place]) right breast upper outer quadrant biopsy 06/04/2021 for a clinical mT2 N0 invasive ductal carcinoma, grade 3, estrogen and progesterone receptor weakly positive, HER2 not amplified, with an MIB-1 of 70% CT CAP and Bone Scan: Neg   Treatment Plan: 1. Neo adj chemo with AC X 4 followed by Taxol and Carboplatin weekly X 12 2. Breast Conserving surgery with SLN surgery 3. Adjuvant XRT 4. Adj Anti estrogen therapy ------------------------------------------------------------------------------------------------------------------- Current Treatment: Completed 4 cycles of AC, today is cycle 4 Taxol with Carboplatin Chemo Toxicities: Fatigue Hemorrhoidal bleeding: Takes MiraLAX prophylactically Chemo induced anemia: Hemoglobin was 10.3 Severe constipation with impaction: Will refer her to gastroenterology.  Patient has an appointment next week Leukopenia: ANC 1.6: Okay to  treat today.   This is stable 6.  Emergency room visit for fevers and chills: No evidence of infection, she was given Tylenol. I gave her a prescription for ciprofloxacin in case she gets fevers over the weekend.  Return to clinic weekly for Taxol and every other week for follow-up with me  No orders of the defined types were placed in this encounter.  The patient has a good understanding of the overall plan. she agrees with it. she will call with any problems that may develop before the next visit here.  Total time spent: 30 mins including face to face time and time spent for planning, charting and coordination of care  Rulon Eisenmenger, MD, MPH 09/18/2021  I, Thana Ates, am acting as scribe for Dr. Nicholas Lose.  I have reviewed the above documentation for accuracy and completeness, and I agree with the above.

## 2021-09-17 NOTE — Assessment & Plan Note (Signed)
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 Neo adj chemo with AC X 4 followed by Taxol and Carboplatin weekly X 12 2. Breast Conserving surgery with SLN surgery 3. Adjuvant XRT 4. Adj Anti estrogen therapy ------------------------------------------------------------------------------------------------------------------- Current Treatment: Completed 4 cycles of AC, today is cycle 4 Taxol with Carboplatin Chemo Toxicities: 1. Fatigue 2. Hemorrhoidal bleeding: I instructed her to take Colace 3 times a day and use the Proctofoam for symptomatic relief 3. We decided to discontinue dexamethasone. She will take antinausea medications on an as-needed basis 4. Chemo induced anemia: Hemoglobin was 10.2 on 08/24/2021 5. Severe constipation with impaction: Went to the emergency room.  She went had a CT scan of the abdomen and pelvis which revealed some kind of narrowing of the rectosigmoid.  I sent a message to Dr. Donne Hazel to evaluate the scan and determine if she needs a sigmoidoscopy or if she needs to be referred to gastroenterology. 6. Leukopenia: ANC 1.5: Okay to treat today.  We lowered the dosage of both Taxol and carboplatin.  If that does not do the job then we will have to give her Granix injections.

## 2021-09-18 ENCOUNTER — Other Ambulatory Visit: Payer: Self-pay | Admitting: *Deleted

## 2021-09-18 ENCOUNTER — Other Ambulatory Visit: Payer: No Typology Code available for payment source

## 2021-09-18 ENCOUNTER — Other Ambulatory Visit: Payer: Self-pay

## 2021-09-18 ENCOUNTER — Inpatient Hospital Stay (HOSPITAL_BASED_OUTPATIENT_CLINIC_OR_DEPARTMENT_OTHER): Payer: No Typology Code available for payment source | Admitting: Hematology and Oncology

## 2021-09-18 ENCOUNTER — Inpatient Hospital Stay: Payer: No Typology Code available for payment source

## 2021-09-18 ENCOUNTER — Inpatient Hospital Stay: Payer: No Typology Code available for payment source | Attending: Oncology

## 2021-09-18 DIAGNOSIS — Z8041 Family history of malignant neoplasm of ovary: Secondary | ICD-10-CM | POA: Insufficient documentation

## 2021-09-18 DIAGNOSIS — M7918 Myalgia, other site: Secondary | ICD-10-CM | POA: Diagnosis not present

## 2021-09-18 DIAGNOSIS — C50411 Malignant neoplasm of upper-outer quadrant of right female breast: Secondary | ICD-10-CM | POA: Diagnosis present

## 2021-09-18 DIAGNOSIS — Z5111 Encounter for antineoplastic chemotherapy: Secondary | ICD-10-CM | POA: Insufficient documentation

## 2021-09-18 DIAGNOSIS — R5383 Other fatigue: Secondary | ICD-10-CM | POA: Insufficient documentation

## 2021-09-18 DIAGNOSIS — D709 Neutropenia, unspecified: Secondary | ICD-10-CM | POA: Diagnosis not present

## 2021-09-18 DIAGNOSIS — Z79899 Other long term (current) drug therapy: Secondary | ICD-10-CM | POA: Insufficient documentation

## 2021-09-18 DIAGNOSIS — R509 Fever, unspecified: Secondary | ICD-10-CM | POA: Diagnosis not present

## 2021-09-18 DIAGNOSIS — Z95828 Presence of other vascular implants and grafts: Secondary | ICD-10-CM

## 2021-09-18 DIAGNOSIS — D6481 Anemia due to antineoplastic chemotherapy: Secondary | ICD-10-CM | POA: Diagnosis not present

## 2021-09-18 DIAGNOSIS — Z170 Estrogen receptor positive status [ER+]: Secondary | ICD-10-CM | POA: Insufficient documentation

## 2021-09-18 DIAGNOSIS — Z803 Family history of malignant neoplasm of breast: Secondary | ICD-10-CM | POA: Diagnosis not present

## 2021-09-18 DIAGNOSIS — K5641 Fecal impaction: Secondary | ICD-10-CM | POA: Diagnosis not present

## 2021-09-18 DIAGNOSIS — Z9221 Personal history of antineoplastic chemotherapy: Secondary | ICD-10-CM | POA: Diagnosis not present

## 2021-09-18 DIAGNOSIS — T451X5A Adverse effect of antineoplastic and immunosuppressive drugs, initial encounter: Secondary | ICD-10-CM | POA: Diagnosis not present

## 2021-09-18 LAB — CBC WITH DIFFERENTIAL (CANCER CENTER ONLY)
Abs Immature Granulocytes: 0.02 10*3/uL (ref 0.00–0.07)
Basophils Absolute: 0.1 10*3/uL (ref 0.0–0.1)
Basophils Relative: 3 %
Eosinophils Absolute: 0.1 10*3/uL (ref 0.0–0.5)
Eosinophils Relative: 4 %
HCT: 30.6 % — ABNORMAL LOW (ref 36.0–46.0)
Hemoglobin: 10.3 g/dL — ABNORMAL LOW (ref 12.0–15.0)
Immature Granulocytes: 1 %
Lymphocytes Relative: 39 %
Lymphs Abs: 1.3 10*3/uL (ref 0.7–4.0)
MCH: 32.9 pg (ref 26.0–34.0)
MCHC: 33.7 g/dL (ref 30.0–36.0)
MCV: 97.8 fL (ref 80.0–100.0)
Monocytes Absolute: 0.3 10*3/uL (ref 0.1–1.0)
Monocytes Relative: 9 %
Neutro Abs: 1.6 10*3/uL — ABNORMAL LOW (ref 1.7–7.7)
Neutrophils Relative %: 44 %
Platelet Count: 218 10*3/uL (ref 150–400)
RBC: 3.13 MIL/uL — ABNORMAL LOW (ref 3.87–5.11)
RDW: 14.6 % (ref 11.5–15.5)
WBC Count: 3.4 10*3/uL — ABNORMAL LOW (ref 4.0–10.5)
nRBC: 0 % (ref 0.0–0.2)

## 2021-09-18 LAB — CMP (CANCER CENTER ONLY)
ALT: 71 U/L — ABNORMAL HIGH (ref 0–44)
AST: 46 U/L — ABNORMAL HIGH (ref 15–41)
Albumin: 4.3 g/dL (ref 3.5–5.0)
Alkaline Phosphatase: 86 U/L (ref 38–126)
Anion gap: 7 (ref 5–15)
BUN: 11 mg/dL (ref 6–20)
CO2: 26 mmol/L (ref 22–32)
Calcium: 9.5 mg/dL (ref 8.9–10.3)
Chloride: 106 mmol/L (ref 98–111)
Creatinine: 0.6 mg/dL (ref 0.44–1.00)
GFR, Estimated: >60 mL/min (ref >60)
Glucose, Bld: 102 mg/dL — ABNORMAL HIGH (ref 70–99)
Potassium: 3.9 mmol/L (ref 3.5–5.1)
Sodium: 139 mmol/L (ref 135–145)
Total Bilirubin: 0.5 mg/dL (ref 0.3–1.2)
Total Protein: 6.9 g/dL (ref 6.5–8.1)

## 2021-09-18 LAB — PREGNANCY, URINE: Preg Test, Ur: NEGATIVE

## 2021-09-18 MED ORDER — FAMOTIDINE 20 MG IN NS 100 ML IVPB
20.0000 mg | Freq: Once | INTRAVENOUS | Status: AC
  Administered 2021-09-18: 20 mg via INTRAVENOUS
  Filled 2021-09-18: qty 100

## 2021-09-18 MED ORDER — SODIUM CHLORIDE 0.9 % IV SOLN: Freq: Once | INTRAVENOUS | Status: AC

## 2021-09-18 MED ORDER — CIPROFLOXACIN HCL 500 MG PO TABS: 500.0000 mg | ORAL_TABLET | Freq: Two times a day (BID) | ORAL | 0 refills | Status: DC

## 2021-09-18 MED ORDER — DIPHENHYDRAMINE HCL 50 MG/ML IJ SOLN
25.0000 mg | Freq: Once | INTRAMUSCULAR | Status: AC
  Administered 2021-09-18: 25 mg via INTRAVENOUS
  Filled 2021-09-18: qty 1

## 2021-09-18 MED ORDER — SODIUM CHLORIDE 0.9 % IV SOLN
105.3000 mg | Freq: Once | INTRAVENOUS | Status: AC
  Administered 2021-09-18: 110 mg via INTRAVENOUS
  Filled 2021-09-18: qty 11

## 2021-09-18 MED ORDER — SODIUM CHLORIDE 0.9% FLUSH
10.0000 mL | INTRAVENOUS | Status: DC | PRN
  Administered 2021-09-18: 10 mL

## 2021-09-18 MED ORDER — SODIUM CHLORIDE 0.9 % IV SOLN
60.0000 mg/m2 | Freq: Once | INTRAVENOUS | Status: AC
  Administered 2021-09-18: 96 mg via INTRAVENOUS
  Filled 2021-09-18: qty 16

## 2021-09-18 MED ORDER — HEPARIN SOD (PORK) LOCK FLUSH 100 UNIT/ML IV SOLN
500.0000 [IU] | Freq: Once | INTRAVENOUS | Status: AC | PRN
  Administered 2021-09-18: 500 [IU]

## 2021-09-18 MED ORDER — SODIUM CHLORIDE 0.9 % IV SOLN
10.0000 mg | Freq: Once | INTRAVENOUS | Status: AC
  Administered 2021-09-18: 10 mg via INTRAVENOUS
  Filled 2021-09-18: qty 10

## 2021-09-18 MED ORDER — SODIUM CHLORIDE 0.9% FLUSH
10.0000 mL | Freq: Once | INTRAVENOUS | Status: AC
  Administered 2021-09-18: 10 mL

## 2021-09-21 ENCOUNTER — Telehealth: Payer: Self-pay | Admitting: *Deleted

## 2021-09-21 NOTE — Telephone Encounter (Signed)
Received call from pt with complaint of fever, chills, increased fatigue that begins the night after tx.  Pt states fever and body aches resovle with OTC tylenol.  Pt requesting advice from MD if she should have a reduced dose of chemotherapy or possibly come back the day after for IV fluids.  Pt scheduled to see NP this week prior to infusion and encouraged pt to discuss possible change in therapy at that time. Pt verbalized understanding and appreciative of advice.

## 2021-09-23 ENCOUNTER — Other Ambulatory Visit: Payer: Self-pay

## 2021-09-23 DIAGNOSIS — C50411 Malignant neoplasm of upper-outer quadrant of right female breast: Secondary | ICD-10-CM

## 2021-09-23 DIAGNOSIS — Z170 Estrogen receptor positive status [ER+]: Secondary | ICD-10-CM

## 2021-09-23 MED FILL — Dexamethasone Sodium Phosphate Inj 100 MG/10ML: INTRAMUSCULAR | Qty: 1 | Status: AC

## 2021-09-24 ENCOUNTER — Inpatient Hospital Stay (HOSPITAL_BASED_OUTPATIENT_CLINIC_OR_DEPARTMENT_OTHER): Payer: No Typology Code available for payment source | Admitting: Adult Health

## 2021-09-24 ENCOUNTER — Other Ambulatory Visit: Payer: Self-pay

## 2021-09-24 ENCOUNTER — Inpatient Hospital Stay: Payer: No Typology Code available for payment source

## 2021-09-24 ENCOUNTER — Encounter: Payer: Self-pay | Admitting: Adult Health

## 2021-09-24 VITALS — BP 110/75 | HR 82 | Temp 97.9°F | Resp 18 | Ht 66.0 in | Wt 121.0 lb

## 2021-09-24 DIAGNOSIS — Z170 Estrogen receptor positive status [ER+]: Secondary | ICD-10-CM

## 2021-09-24 DIAGNOSIS — C50411 Malignant neoplasm of upper-outer quadrant of right female breast: Secondary | ICD-10-CM

## 2021-09-24 LAB — CMP (CANCER CENTER ONLY)
ALT: 43 U/L (ref 0–44)
AST: 28 U/L (ref 15–41)
Albumin: 4 g/dL (ref 3.5–5.0)
Alkaline Phosphatase: 79 U/L (ref 38–126)
Anion gap: 5 (ref 5–15)
BUN: 13 mg/dL (ref 6–20)
CO2: 26 mmol/L (ref 22–32)
Calcium: 9.2 mg/dL (ref 8.9–10.3)
Chloride: 108 mmol/L (ref 98–111)
Creatinine: 0.58 mg/dL (ref 0.44–1.00)
GFR, Estimated: >60 mL/min (ref >60)
Glucose, Bld: 97 mg/dL (ref 70–99)
Potassium: 4 mmol/L (ref 3.5–5.1)
Sodium: 139 mmol/L (ref 135–145)
Total Bilirubin: 0.6 mg/dL (ref 0.3–1.2)
Total Protein: 6.3 g/dL — ABNORMAL LOW (ref 6.5–8.1)

## 2021-09-24 LAB — CBC WITH DIFFERENTIAL (CANCER CENTER ONLY)
Abs Immature Granulocytes: 0 10*3/uL (ref 0.00–0.07)
Basophils Absolute: 0 10*3/uL (ref 0.0–0.1)
Basophils Relative: 1 %
Eosinophils Absolute: 0.2 10*3/uL (ref 0.0–0.5)
Eosinophils Relative: 5 %
HCT: 27.9 % — ABNORMAL LOW (ref 36.0–46.0)
Hemoglobin: 9.5 g/dL — ABNORMAL LOW (ref 12.0–15.0)
Immature Granulocytes: 0 %
Lymphocytes Relative: 34 %
Lymphs Abs: 1 10*3/uL (ref 0.7–4.0)
MCH: 33.1 pg (ref 26.0–34.0)
MCHC: 34.1 g/dL (ref 30.0–36.0)
MCV: 97.2 fL (ref 80.0–100.0)
Monocytes Absolute: 0.2 10*3/uL (ref 0.1–1.0)
Monocytes Relative: 6 %
Neutro Abs: 1.6 10*3/uL — ABNORMAL LOW (ref 1.7–7.7)
Neutrophils Relative %: 54 %
Platelet Count: 177 10*3/uL (ref 150–400)
RBC: 2.87 MIL/uL — ABNORMAL LOW (ref 3.87–5.11)
RDW: 13.9 % (ref 11.5–15.5)
WBC Count: 2.9 10*3/uL — ABNORMAL LOW (ref 4.0–10.5)
nRBC: 0 % (ref 0.0–0.2)

## 2021-09-24 LAB — PREGNANCY, URINE: Preg Test, Ur: NEGATIVE

## 2021-09-24 MED ORDER — FAMOTIDINE 20 MG IN NS 100 ML IVPB
20.0000 mg | Freq: Once | INTRAVENOUS | Status: AC
  Administered 2021-09-24: 20 mg via INTRAVENOUS
  Filled 2021-09-24: qty 100

## 2021-09-24 MED ORDER — SODIUM CHLORIDE 0.9 % IV SOLN
60.0000 mg/m2 | Freq: Once | INTRAVENOUS | Status: AC
  Administered 2021-09-24: 96 mg via INTRAVENOUS
  Filled 2021-09-24: qty 16

## 2021-09-24 MED ORDER — HEPARIN SOD (PORK) LOCK FLUSH 100 UNIT/ML IV SOLN
500.0000 [IU] | Freq: Once | INTRAVENOUS | Status: AC | PRN
  Administered 2021-09-24: 500 [IU]

## 2021-09-24 MED ORDER — DIPHENHYDRAMINE HCL 50 MG/ML IJ SOLN
25.0000 mg | Freq: Once | INTRAMUSCULAR | Status: AC
  Administered 2021-09-24: 25 mg via INTRAVENOUS
  Filled 2021-09-24: qty 1

## 2021-09-24 MED ORDER — SODIUM CHLORIDE 0.9 % IV SOLN: Freq: Once | INTRAVENOUS | Status: AC

## 2021-09-24 MED ORDER — SODIUM CHLORIDE 0.9 % IV SOLN
105.3000 mg | Freq: Once | INTRAVENOUS | Status: AC
  Administered 2021-09-24: 110 mg via INTRAVENOUS
  Filled 2021-09-24: qty 11

## 2021-09-24 MED ORDER — SODIUM CHLORIDE 0.9 % IV SOLN
10.0000 mg | Freq: Once | INTRAVENOUS | Status: AC
  Administered 2021-09-24: 10 mg via INTRAVENOUS
  Filled 2021-09-24: qty 10

## 2021-09-24 MED ORDER — SODIUM CHLORIDE 0.9% FLUSH
10.0000 mL | INTRAVENOUS | Status: DC | PRN
  Administered 2021-09-24: 10 mL

## 2021-09-24 NOTE — Progress Notes (Signed)
[facility] Follow up:    Ebony Contras, MD [place] [id number]   DIAGNOSIS:  Cancer Staging  Malignant neoplasm of upper-outer quadrant of right breast in female, estrogen receptor positive ([place]) Staging form: Breast, AJCC 8th Edition - Clinical stage from 06/10/2021: Stage IIA (cT2, cN0, cM0, G3, ER+, PR+, HER2-) - Signed by Chauncey Cruel, MD on 06/10/2021 Stage prefix: Initial diagnosis Histologic grading system: 3 grade system Laterality: Right Staged by: Pathologist and managing physician Stage used in treatment planning: Yes National guidelines used in treatment planning: Yes Type of national guideline used in treatment planning: NCCN   SUMMARY OF ONCOLOGIC HISTORY: Oncology History  Malignant neoplasm of upper-outer quadrant of right breast in female, estrogen receptor positive ([place])  06/04/2021 Initial Diagnosis   status post right breast upper outer quadrant biopsy 06/04/2021 for a clinical mT2 N0 invasive ductal carcinoma, grade 3, estrogen and progesterone receptor inadequate (functionally triple negative with HER2 not amplified), with an MIB-1 of 70%             (A) chest CT with contrast 06/22/2021 shows right breast mass, no other lesions of concern; pulmonary nodules all less than 5 mm noted, likely benign             (B) bone scan 06/22/2021 negative for metastases; consistent with sacroiliitis   06/10/2021 Cancer Staging   Staging form: Breast, AJCC 8th Edition - Clinical stage from 06/10/2021: Stage IIA (cT2, cN0, cM0, G3, ER+, PR+, HER2-) - Signed by Chauncey Cruel, MD on 06/10/2021 Stage prefix: Initial diagnosis Histologic grading system: 3 grade system Laterality: Right Staged by: Pathologist and managing physician Stage used in treatment planning: Yes National guidelines used in treatment planning: Yes Type of national guideline used in treatment planning: NCCN    06/16/2021 Genetic Testing   [facility]  CancerNext-Expanded was negative. No pathogenic variants were identified. 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 Report date is 06/19/2021.  The CancerNext-Expanded gene panel offered by [facility] and includes sequencing, rearrangement, and RNA analysis for the following 77 genes: AIP, ALK, APC, ATM, AXIN2, BAP1, BARD1, BLM, BMPR1A, BRCA1, BRCA2, BRIP1, CDC73, CDH1, CDK4, CDKN1B, CDKN2A, CHEK2, CTNNA1, DICER1, FANCC, FH, FLCN, GALNT12, KIF1B, LZTR1, MAX, MEN1, MET, MLH1, MSH2, MSH3, MSH6, MUTYH, NBN, NF1, NF2, NTHL1, PALB2, PHOX2B, PMS2, POT1, PRKAR1A, PTCH1, PTEN, RAD51C, RAD51D, RB1, RECQL, RET, SDHA, SDHAF2, SDHB, SDHC, SDHD, SMAD4, SMARCA4, SMARCB1, SMARCE1, STK11, SUFU, TMEM127, TP53, TSC1, TSC2, VHL and XRCC2 (sequencing and deletion/duplication); EGFR, EGLN1, HOXB13, KIT, MITF, PDGFRA, POLD1, and POLE (sequencing only); EPCAM and GREM1 (deletion/duplication only).    06/25/2021 -  Neo-Adjuvant Chemotherapy   neoadjuvant chemotherapy consisting of doxorubicin and cyclophosphamide in dose dense fashion x4 started 0/13/2022, completed 08/10/2021, to be followed by weekly carboplatin and paclitaxel x12 starting 08/24/2021     CURRENT THERAPY: Carbo Taxol  INTERVAL HISTORY: Ebony Lamb 46 y.o. female returns for evaluation prior to receiving her next cycle of dose reduced Carbo and Taxol.  She is tolerating it moderately well.  Her main concern has been experiencing a fever and achiness that begins after 12 hours after receiving her treatment.  This first began after cycle 3 and she was evaluted in the ER and a full fever work up was negative.  After cycle 4 the same fever occurred and this was treated at home without difficulty.    Ebony Lamb is becoming more fatigued with each subsequent  chemotherapy treatment.  She denies any peripheral neuropathy or further concerns.     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  MEDICAL HISTORY: Past Medical History:  Diagnosis Date   Abnormal Pap smear 09/13/2000   Allergy 02/2020   Seasonal   Cancer ([place])    Depression    history of depression- denies currently   H/O varicella    Hypertension 09/14/2011   post partum- meds x 1 mo   No pertinent past medical history    Preterm labor     SURGICAL HISTORY: Past Surgical History:  Procedure Laterality Date   BREAST BIOPSY Right 07/27/2019   CESAREAN SECTION  11/14/2011   Procedure: CESAREAN SECTION;  Surgeon: Eli Hose, MD;  Location: [facility];  Service: Gynecology;  Laterality: N/A;  Primary cesarean section with delivery of baby boy at [phone]. Apgars 9/9.   CESAREAN SECTION N/A 03/22/2014   Procedure: Repeat CESAREAN SECTION;  Surgeon: Alwyn Pea, MD;  Location: [facility];  Service: Obstetrics;  Laterality: N/A;   COSMETIC SURGERY  11/22/2017   Abdominoplasty   FOOT SURGERY     FRACTURE SURGERY  08/1999   Repair of 5th metatarsal in left foot   HERNIA REPAIR  11/22/2017   PORTACATH PLACEMENT N/A 06/24/2021   Procedure: INSERTION PORT-A-CATH;  Surgeon: Coralie Keens, MD;  Location: [facility];  Service: General;  Laterality: N/A;   TUBAL LIGATION  11/22/2017   Tubes were completely removed   WISDOM TOOTH EXTRACTION       SOCIAL HISTORY: Social History   Socioeconomic History   Marital status: Married    Spouse name: Not on file   Number of children: Not on file   Years of education: Not on file   Highest education level: Not on file  Occupational History   Not on file  Tobacco Use   Smoking status: Never   Smokeless tobacco: Never  Vaping Use   Vaping Use: Never used  Substance and Sexual Activity   Alcohol use: Yes    Alcohol/week: 7.0 standard drinks    Types: 7 Standard drinks or equivalent per week    Comment: I stopped drinking alcohol after finding the lump in my brea   Drug use: No   Sexual activity: Yes    Birth control/protection: Surgical  Other Topics Concern   Not on file  Social History Narrative   Not on file   Social Determinants of Health   Financial Resource Strain: Low Risk    Difficulty of Paying Living Expenses: Not very hard  Food Insecurity: No Food Insecurity   Worried About Running Out of Food in the Last Year: Never true   Ran Out of Food in the Last Year: Never true  Transportation Needs: No Transportation Needs   Lack of Transportation (Medical): No  Lack of Transportation (Non-Medical): No  Physical Activity: Not on file  Stress: Not on file  Social Connections: Not on file  Intimate Partner Violence: Not on file    FAMILY HISTORY: Family History  Problem Relation Age of Onset   Breast cancer Mother 64   Osteopenia Mother    Arthritis Mother    Other Mother        BRCA2 gene mutation   Hypertension Father    Kidney disease Father    Depression Father    Hyperlipidemia Father    Melanoma Father 65   Hypertension Brother    Hyperlipidemia Brother    Breast cancer Maternal Grandmother        dx. late 30s/40s   Heart disease Maternal Grandfather    Other Maternal Grandfather        BRCA2 gene mutation   Ovarian cancer Paternal Grandmother        dx. 48s   Stroke Paternal Grandfather    Birth defects Son    Anesthesia problems Neg Hx     Diabetes Neg Hx     Review of Systems  Constitutional:  Positive for fatigue. Negative for appetite change, chills, fever and unexpected weight change.  HENT:   Negative for hearing loss, lump/mass and trouble swallowing.   Eyes:  Negative for eye problems and icterus.  Respiratory:  Negative for chest tightness, cough and shortness of breath.   Cardiovascular:  Negative for chest pain, leg swelling and palpitations.  Gastrointestinal:  Negative for abdominal distention, abdominal pain, constipation, diarrhea, nausea and vomiting.  Endocrine: Negative for hot flashes.  Genitourinary:  Negative for difficulty urinating.   Musculoskeletal:  Negative for arthralgias.  Skin:  Negative for itching and rash.  Neurological:  Negative for dizziness, extremity weakness, headaches and numbness.  Hematological:  Negative for adenopathy. Does not bruise/bleed easily.  Psychiatric/Behavioral:  Negative for depression. The patient is not nervous/anxious.      PHYSICAL EXAMINATION  ECOG PERFORMANCE STATUS: 1 - Symptomatic but completely ambulatory  Vitals:   09/24/21 0847  BP: 110/75  Pulse: 82  Resp: 18  Temp: 97.9 F (36.6 C)  SpO2: 100%    Physical Exam Constitutional:      General: She is not in acute distress.    Appearance: Normal appearance. She is not toxic-appearing.  HENT:     Head: Normocephalic and atraumatic.  Eyes:     General: No scleral icterus. Cardiovascular:     Rate and Rhythm: Normal rate and regular rhythm.     Pulses: Normal pulses.     Heart sounds: Normal heart sounds.  Pulmonary:     Effort: Pulmonary effort is normal.     Breath sounds: Normal breath sounds.  Abdominal:     General: Abdomen is flat. Bowel sounds are normal. There is no distension.     Palpations: Abdomen is soft.     Tenderness: There is no abdominal tenderness.  Musculoskeletal:        General: No swelling.     Cervical back: Neck supple.  Lymphadenopathy:     Cervical: No  cervical adenopathy.  Skin:    General: Skin is warm and dry.     Findings: No rash.  Neurological:     General: No focal deficit present.     Mental Status: She is alert.  Psychiatric:        Mood and Affect: Mood normal.        Behavior: Behavior normal.    LABORATORY  DATA:  CBC    Component Value Date/Time   WBC 2.9 (L) 09/24/2021 0819   WBC 9.9 09/12/2021 0028   RBC 2.87 (L) 09/24/2021 0819   HGB 9.5 (L) 09/24/2021 0819   HCT 27.9 (L) 09/24/2021 0819   PLT 177 09/24/2021 0819   MCV 97.2 09/24/2021 0819   MCH 33.1 09/24/2021 0819   MCHC 34.1 09/24/2021 0819   RDW 13.9 09/24/2021 0819   LYMPHSABS 1.0 09/24/2021 0819   MONOABS 0.2 09/24/2021 0819   EOSABS 0.2 09/24/2021 0819   BASOSABS 0.0 09/24/2021 0819    CMP     Component Value Date/Time   NA 139 09/24/2021 0819   K 4.0 09/24/2021 0819   CL 108 09/24/2021 0819   CO2 26 09/24/2021 0819   GLUCOSE 97 09/24/2021 0819   BUN 13 09/24/2021 0819   CREATININE 0.58 09/24/2021 0819   CALCIUM 9.2 09/24/2021 0819   PROT 6.3 (L) 09/24/2021 0819   ALBUMIN 4.0 09/24/2021 0819   AST 28 09/24/2021 0819   ALT 43 09/24/2021 0819   ALKPHOS 79 09/24/2021 0819   BILITOT 0.6 09/24/2021 0819   GFRNONAA >60 09/24/2021 0819   GFRAA >90 11/26/2011 2008     ASSESSMENT and THERAPY PLAN:   Malignant neoplasm of upper-outer quadrant of right breast in female, estrogen receptor positive ([place]) right breast upper outer quadrant biopsy 06/04/2021 for a clinical mT2 N0 invasive ductal carcinoma, grade 3, estrogen and progesterone receptor weakly positive, HER2 not amplified, with an MIB-1 of 70% CT CAP and Bone Scan: Neg   Treatment Plan: 1. Neo adj chemo with AC X 4 followed by Taxol and Carboplatin weekly X 12 2. Breast Conserving surgery with SLN surgery 3. Adjuvant XRT 4. Adj Anti estrogen therapy ------------------------------------------------------------------------------------------------------------------- Current  Treatment: Completed 5 cycles of AC, today is cycle 4 Taxol with Carboplatin Chemo Toxicities: Fatigue Chemo induced anemia: Hemoglobin was 10.2 on 08/24/2021 Leukopenia: ANC 1.6: Okay to treat today.  Taxol and Carbo has been dose reduced.  Will follow closely.   Fevers following chemotherapy: I reviewed this concern with Dr. Lindi Adie and he believes it is consistent with drug fever.  She will continue with treatment and will take tylenol if needed for her fever.    Ebony Lamb will return in 1 week for labs, and treatment.  In two weeks she has labs, f/u with Dr. Lindi Adie, and her next treatment.     No orders of the defined types were placed in this encounter.   All questions were answered. The patient knows to call the clinic with any problems, questions or concerns. We can certainly see the patient much sooner if necessary.  Total encounter time: 30 minutes in face ot face visit time, chart review, lab review, care coordination, and documentation of the encounter.    Wilber Bihari, NP 09/25/21 7:12 PM Medical Oncology and Hematology [facility] [place], [place] [address] Tel. [phone]    Fax. [phone]  *Total Encounter Time as defined by the Centers for Medicare and Medicaid Services includes, in addition to the face-to-face time of a patient visit (documented in the note above) non-face-to-face time: obtaining and reviewing outside history, ordering and reviewing medications, tests or procedures, care coordination (communications with other health care professionals or caregivers) and documentation in the medical record.

## 2021-09-24 NOTE — Patient Instructions (Signed)

## 2021-09-25 ENCOUNTER — Encounter: Payer: Self-pay | Admitting: Oncology

## 2021-09-25 NOTE — Assessment & Plan Note (Signed)
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 Neo adj chemo with AC X 4 followed by Taxol and Carboplatin weekly X 12 2. Breast Conserving surgery with SLN surgery 3. Adjuvant XRT 4. Adj Anti estrogen therapy ------------------------------------------------------------------------------------------------------------------- Current Treatment: Completed 5 cycles of AC, today is cycle 4 Taxol with Carboplatin Chemo Toxicities: 1. Fatigue 2. Chemo induced anemia: Hemoglobin was 10.2 on 08/24/2021 3. Leukopenia: ANC 1.6: Okay to treat today.  Taxol and Carbo has been dose reduced.  Will follow closely.   4. Fevers following chemotherapy: I reviewed this concern with Dr. Lindi Lamb and he believes it is consistent with drug fever.  She will continue with treatment and will take tylenol if needed for her fever.    Ebony Lamb will return in 1 week for labs, and treatment.  In two weeks she has labs, f/u with Dr. Lindi Lamb, and her next treatment.

## 2021-09-30 ENCOUNTER — Other Ambulatory Visit: Payer: Self-pay

## 2021-09-30 ENCOUNTER — Other Ambulatory Visit: Payer: Self-pay | Admitting: Hematology and Oncology

## 2021-09-30 DIAGNOSIS — C50411 Malignant neoplasm of upper-outer quadrant of right female breast: Secondary | ICD-10-CM

## 2021-10-01 ENCOUNTER — Other Ambulatory Visit: Payer: Self-pay | Admitting: *Deleted

## 2021-10-01 MED FILL — Dexamethasone Sodium Phosphate Inj 100 MG/10ML: INTRAMUSCULAR | Qty: 1 | Status: AC

## 2021-10-02 ENCOUNTER — Other Ambulatory Visit: Payer: Self-pay

## 2021-10-02 ENCOUNTER — Inpatient Hospital Stay: Payer: No Typology Code available for payment source

## 2021-10-02 VITALS — BP 118/82 | HR 81 | Temp 98.4°F | Resp 18 | Wt 120.4 lb

## 2021-10-02 DIAGNOSIS — C50411 Malignant neoplasm of upper-outer quadrant of right female breast: Secondary | ICD-10-CM | POA: Diagnosis not present

## 2021-10-02 DIAGNOSIS — Z170 Estrogen receptor positive status [ER+]: Secondary | ICD-10-CM

## 2021-10-02 DIAGNOSIS — Z95828 Presence of other vascular implants and grafts: Secondary | ICD-10-CM

## 2021-10-02 LAB — CBC WITH DIFFERENTIAL (CANCER CENTER ONLY)
Abs Immature Granulocytes: 0.01 10*3/uL (ref 0.00–0.07)
Basophils Absolute: 0 10*3/uL (ref 0.0–0.1)
Basophils Relative: 1 %
Eosinophils Absolute: 0.1 10*3/uL (ref 0.0–0.5)
Eosinophils Relative: 3 %
HCT: 31.7 % — ABNORMAL LOW (ref 36.0–46.0)
Hemoglobin: 10.8 g/dL — ABNORMAL LOW (ref 12.0–15.0)
Immature Granulocytes: 0 %
Lymphocytes Relative: 30 %
Lymphs Abs: 0.9 10*3/uL (ref 0.7–4.0)
MCH: 33.6 pg (ref 26.0–34.0)
MCHC: 34.1 g/dL (ref 30.0–36.0)
MCV: 98.8 fL (ref 80.0–100.0)
Monocytes Absolute: 0.2 10*3/uL (ref 0.1–1.0)
Monocytes Relative: 5 %
Neutro Abs: 1.8 10*3/uL (ref 1.7–7.7)
Neutrophils Relative %: 61 %
Platelet Count: 195 10*3/uL (ref 150–400)
RBC: 3.21 MIL/uL — ABNORMAL LOW (ref 3.87–5.11)
RDW: 13.5 % (ref 11.5–15.5)
WBC Count: 3.1 10*3/uL — ABNORMAL LOW (ref 4.0–10.5)
nRBC: 0 % (ref 0.0–0.2)

## 2021-10-02 LAB — CMP (CANCER CENTER ONLY)
ALT: 34 U/L (ref 0–44)
AST: 34 U/L (ref 15–41)
Albumin: 4.2 g/dL (ref 3.5–5.0)
Alkaline Phosphatase: 70 U/L (ref 38–126)
Anion gap: 6 (ref 5–15)
BUN: 6 mg/dL (ref 6–20)
CO2: 25 mmol/L (ref 22–32)
Calcium: 9.3 mg/dL (ref 8.9–10.3)
Chloride: 110 mmol/L (ref 98–111)
Creatinine: 0.57 mg/dL (ref 0.44–1.00)
GFR, Estimated: >60 mL/min (ref >60)
Glucose, Bld: 107 mg/dL — ABNORMAL HIGH (ref 70–99)
Potassium: 3.7 mmol/L (ref 3.5–5.1)
Sodium: 141 mmol/L (ref 135–145)
Total Bilirubin: 0.6 mg/dL (ref 0.3–1.2)
Total Protein: 6.6 g/dL (ref 6.5–8.1)

## 2021-10-02 LAB — PREGNANCY, URINE: Preg Test, Ur: NEGATIVE

## 2021-10-02 MED ORDER — SODIUM CHLORIDE 0.9 % IV SOLN
105.3000 mg | Freq: Once | INTRAVENOUS | Status: AC
  Administered 2021-10-02: 110 mg via INTRAVENOUS
  Filled 2021-10-02: qty 11

## 2021-10-02 MED ORDER — DIPHENHYDRAMINE HCL 50 MG/ML IJ SOLN
25.0000 mg | Freq: Once | INTRAMUSCULAR | Status: AC
  Administered 2021-10-02: 25 mg via INTRAVENOUS
  Filled 2021-10-02: qty 1

## 2021-10-02 MED ORDER — SODIUM CHLORIDE 0.9 % IV SOLN
10.0000 mg | Freq: Once | INTRAVENOUS | Status: AC
  Administered 2021-10-02: 10 mg via INTRAVENOUS
  Filled 2021-10-02: qty 10

## 2021-10-02 MED ORDER — SODIUM CHLORIDE 0.9% FLUSH
10.0000 mL | INTRAVENOUS | Status: DC | PRN
  Administered 2021-10-02: 10 mL

## 2021-10-02 MED ORDER — SODIUM CHLORIDE 0.9% FLUSH
10.0000 mL | Freq: Once | INTRAVENOUS | Status: AC
  Administered 2021-10-02: 10 mL

## 2021-10-02 MED ORDER — FAMOTIDINE 20 MG IN NS 100 ML IVPB
20.0000 mg | Freq: Once | INTRAVENOUS | Status: AC
  Administered 2021-10-02: 20 mg via INTRAVENOUS
  Filled 2021-10-02: qty 100

## 2021-10-02 MED ORDER — SODIUM CHLORIDE 0.9 % IV SOLN
60.0000 mg/m2 | Freq: Once | INTRAVENOUS | Status: AC
  Administered 2021-10-02: 96 mg via INTRAVENOUS
  Filled 2021-10-02: qty 16

## 2021-10-02 MED ORDER — HEPARIN SOD (PORK) LOCK FLUSH 100 UNIT/ML IV SOLN
500.0000 [IU] | Freq: Once | INTRAVENOUS | Status: AC | PRN
  Administered 2021-10-02: 500 [IU]

## 2021-10-02 MED ORDER — SODIUM CHLORIDE 0.9 % IV SOLN: Freq: Once | INTRAVENOUS | Status: AC

## 2021-10-05 ENCOUNTER — Ambulatory Visit (INDEPENDENT_AMBULATORY_CARE_PROVIDER_SITE_OTHER): Payer: No Typology Code available for payment source | Admitting: Physician Assistant

## 2021-10-05 ENCOUNTER — Encounter: Payer: Self-pay | Admitting: Licensed Clinical Social Worker

## 2021-10-05 ENCOUNTER — Encounter: Payer: Self-pay | Admitting: Physician Assistant

## 2021-10-05 ENCOUNTER — Encounter: Payer: Self-pay | Admitting: *Deleted

## 2021-10-05 VITALS — BP 110/66 | HR 62 | Ht 66.0 in | Wt 120.0 lb

## 2021-10-05 DIAGNOSIS — C50411 Malignant neoplasm of upper-outer quadrant of right female breast: Secondary | ICD-10-CM

## 2021-10-05 DIAGNOSIS — R935 Abnormal findings on diagnostic imaging of other abdominal regions, including retroperitoneum: Secondary | ICD-10-CM | POA: Diagnosis not present

## 2021-10-05 DIAGNOSIS — K5900 Constipation, unspecified: Secondary | ICD-10-CM

## 2021-10-05 DIAGNOSIS — Z1212 Encounter for screening for malignant neoplasm of rectum: Secondary | ICD-10-CM

## 2021-10-05 DIAGNOSIS — Z170 Estrogen receptor positive status [ER+]: Secondary | ICD-10-CM

## 2021-10-05 DIAGNOSIS — Z1211 Encounter for screening for malignant neoplasm of colon: Secondary | ICD-10-CM

## 2021-10-05 MED ORDER — NA SULFATE-K SULFATE-MG SULF 17.5-3.13-1.6 GM/177ML PO SOLN
1.0000 | Freq: Once | ORAL | 0 refills | Status: AC
Start: 2021-10-05 — End: 2021-10-05

## 2021-10-05 NOTE — Progress Notes (Signed)
.  Chief Complaint: Constipation and abnormal CT of the pelvis  HPI:    Mrs. Ebony Lamb is a 46 year old female with past medical history as listed below including breast cancer currently undergoing chemo, who was referred to me by Antony Contras, MD for a complaint of abnormal CT of the pelvis.    08/30/2021 patient seen in the ED for constipation.  At that time rectal exam a small tender hemorrhoid and a large amount of firm stool in the rectal vault disimpacted.  Patient had a CT of the pelvis due to pouchitis/proctitis suspected with a large rectal stool ball without adjacent wall thickening or inflammation.  Focal short segment kinking/narrowing of the rectosigmoid colon, favored to be due to focally decompressed colon, but underlying neoplasm cannot be excluded.    09/24/2021 patient followed with her oncologist in regards to breast cancer.    Today, the patient presents to clinic and tells me that she went to the ER as above due to severe constipation.  She tells me that since the disimpaction she was taking MiraLAX and this was working well to keep her more regular.  Describes that she was on Zofran which she thinks was making everything worse at the time.  Now she just takes a dose of MiraLAX when she has to take her Compazine when she takes her chemotherapy once a week.  In fact, this week she did not use the MiraLAX at all and she has maintained soft solid regular bowel movements with no abdominal pain.  No prior symptoms to all of this.  No family history of colon cancer.  Describes a history of hemorrhoids which are not bothering her currently.    Patient tells me she is due to finish chemotherapy in early March and 3 weeks later will have her surgical revision.    Denies fever, chills, weight loss, continued blood in her stool or symptoms that awaken her from sleep.  Past Medical History:  Diagnosis Date   Abnormal Pap smear 09/13/2000   Allergy 02/2020   Seasonal   Breast cancer ([place])     Depression    history of depression- denies currently   H/O varicella    Hypertension 09/14/2011   post partum- meds x 1 mo   No pertinent past medical history    Preterm labor     Past Surgical History:  Procedure Laterality Date   BREAST BIOPSY Right 07/27/2019   CESAREAN SECTION  11/14/2011   Procedure: CESAREAN SECTION;  Surgeon: Eli Hose, MD;  Location: [facility];  Service: Gynecology;  Laterality: N/A;  Primary cesarean section with delivery of baby boy at [phone]. Apgars 9/9.   CESAREAN SECTION N/A 03/22/2014   Procedure: Repeat CESAREAN SECTION;  Surgeon: Alwyn Pea, MD;  Location: [facility];  Service: Obstetrics;  Laterality: N/A;   COSMETIC SURGERY  11/22/2017   Abdominoplasty   FOOT SURGERY     FRACTURE SURGERY  08/1999   Repair of 5th metatarsal in left foot   HERNIA REPAIR  11/22/2017   PORTACATH PLACEMENT N/A 06/24/2021   Procedure: INSERTION PORT-A-CATH;  Surgeon: Coralie Keens, MD;  Location: [facility];  Service: General;  Laterality: N/A;   TUBAL LIGATION  11/22/2017   Tubes were completely removed   WISDOM TOOTH EXTRACTION      Current Outpatient Medications  Medication Sig Dispense Refill   ciprofloxacin (CIPRO) 500 MG tablet Take 1 tablet (500 mg total) by mouth 2 (two) times daily. 14 tablet 0  lidocaine-prilocaine (EMLA) cream Apply to affected area once 30 g 3   loratadine (CLARITIN) 10 MG tablet Take 1 tablet (10 mg total) by mouth daily. Take day post chemo and then daily for 10 days with each treatment 30 tablet 3   LORazepam (ATIVAN) 0.5 MG tablet Take 1 tablet (0.5 mg total) by mouth at bedtime as needed (Nausea or vomiting). 30 tablet 0   metoCLOPramide (REGLAN) 5 MG tablet Take 1 tablet (5 mg total) by mouth every 6 (six) hours as needed for nausea. 90 tablet 1   prochlorperazine (COMPAZINE) 10 MG tablet Take 1 tablet (10 mg total) by mouth every 6 (six) hours as needed (Nausea or vomiting). 30 tablet 1   No current  facility-administered medications for this visit.   Facility-Administered Medications Ordered in Other Visits  Medication Dose Route Frequency Provider Last Rate Last Admin   influenza vac split quadrivalent PF (FLUARIX) 0.5 ML injection            influenza vac split quadrivalent PF (FLUARIX) injection 0.5 mL  0.5 mL Intramuscular Once Magrinat, Virgie Dad, MD        Allergies as of 10/05/2021 - Review Complete 10/05/2021  Allergen Reaction Noted   Cefepime Rash 09/24/2021    Family History  Problem Relation Age of Onset   Breast cancer Mother 57   Osteopenia Mother    Arthritis Mother    Other Mother        BRCA2 gene mutation   Ulcerative colitis Mother    Hypertension Father    Kidney disease Father    Depression Father    Hyperlipidemia Father    Melanoma Father 61   Hypertension Brother    Hyperlipidemia Brother    Breast cancer Maternal Grandmother        dx. late 30s/40s   Heart disease Maternal Grandfather    Other Maternal Grandfather        BRCA2 gene mutation   Ovarian cancer Paternal Grandmother        dx. 32s   Stroke Paternal Grandfather    Birth defects Son    Anesthesia problems Neg Hx    Diabetes Neg Hx     Social History   Socioeconomic History   Marital status: Married    Spouse name: Not on file   Number of children: Not on file   Years of education: Not on file   Highest education level: Not on file  Occupational History   Not on file  Tobacco Use   Smoking status: Never   Smokeless tobacco: Never  Vaping Use   Vaping Use: Never used  Substance and Sexual Activity   Alcohol use: Yes    Alcohol/week: 7.0 standard drinks    Types: 7 Standard drinks or equivalent per week    Comment: I stopped drinking alcohol after finding the lump in my brea   Drug use: No   Sexual activity: Yes    Birth control/protection: Surgical  Other Topics Concern   Not on file  Social History Narrative   Not on file   Social Determinants of Health    Financial Resource Strain: Low Risk    Difficulty of Paying Living Expenses: Not very hard  Food Insecurity: No Food Insecurity   Worried About Charity fundraiser in the Last Year: Never true   Ran Out of Food in the Last Year: Never true  Transportation Needs: No Transportation Needs   Lack of Transportation (Medical): No   Lack  of Transportation (Non-Medical): No  Physical Activity: Not on file  Stress: Not on file  Social Connections: Not on file  Intimate Partner Violence: Not on file    Review of Systems:    Constitutional: No weight loss, fever or chills Skin: +rash on neck Cardiovascular: No chest pain Respiratory: No SOB  Gastrointestinal: See HPI and otherwise negative Genitourinary: No dysuria  Neurological: No headache, dizziness or syncope Musculoskeletal: No new muscle or joint pain Hematologic: No  bruising Psychiatric: No history of depression or anxiety   Physical Exam:  Vital signs: BP 110/66    Pulse 62    Ht '5\' 6"'  (1.676 m)    Wt 120 lb (54.4 kg)    BMI 19.37 kg/m    Constitutional:   Pleasant Caucasian female appears to be in NAD, Well developed, Well nourished, alert and cooperative Head:  Normocephalic and atraumatic. Eyes:   PEERL, EOMI. No icterus. Conjunctiva pink. Ears:  Normal auditory acuity. Neck:  Supple Throat: Oral cavity and pharynx without inflammation, swelling or lesion.  Respiratory: Respirations even and unlabored. Lungs clear to auscultation bilaterally.   No wheezes, crackles, or rhonchi. +portacath Cardiovascular: Normal S1, S2. No MRG. Regular rate and rhythm. No peripheral edema, cyanosis or pallor.  Gastrointestinal:  Soft, nondistended, nontender. No rebound or guarding. Normal bowel sounds. No appreciable masses or hepatomegaly. Rectal:  Not performed.  Msk:  Symmetrical without gross deformities. Without edema, no deformity or joint abnormality.  Neurologic:  Alert and  oriented x4;  grossly normal neurologically.  Skin:    Dry and intact without significant lesions or rashes. Psychiatric: Demonstrates good judgement and reason without abnormal affect or behaviors.  RELEVANT LABS AND IMAGING: CBC    Component Value Date/Time   WBC 3.1 (L) 10/02/2021 0801   WBC 9.9 09/12/2021 0028   RBC 3.21 (L) 10/02/2021 0801   HGB 10.8 (L) 10/02/2021 0801   HCT 31.7 (L) 10/02/2021 0801   PLT 195 10/02/2021 0801   MCV 98.8 10/02/2021 0801   MCH 33.6 10/02/2021 0801   MCHC 34.1 10/02/2021 0801   RDW 13.5 10/02/2021 0801   LYMPHSABS 0.9 10/02/2021 0801   MONOABS 0.2 10/02/2021 0801   EOSABS 0.1 10/02/2021 0801   BASOSABS 0.0 10/02/2021 0801    CMP     Component Value Date/Time   NA 141 10/02/2021 0801   K 3.7 10/02/2021 0801   CL 110 10/02/2021 0801   CO2 25 10/02/2021 0801   GLUCOSE 107 (H) 10/02/2021 0801   BUN 6 10/02/2021 0801   CREATININE 0.57 10/02/2021 0801   CALCIUM 9.3 10/02/2021 0801   PROT 6.6 10/02/2021 0801   ALBUMIN 4.2 10/02/2021 0801   AST 34 10/02/2021 0801   ALT 34 10/02/2021 0801   ALKPHOS 70 10/02/2021 0801   BILITOT 0.6 10/02/2021 0801   GFRNONAA >60 10/02/2021 0801   GFRAA >90 11/26/2011 2008    Assessment: 1.  Screening for colorectal cancer: Patient will be 78 in March and due for screening 2.  Abnormal CT of the pelvis: Completed at time of severe constipation, constipation is since resolved, there is question about a spot just superior to rectal ball; likely just deflated colon in that area due to rectal wall below, but cannot rule out underlying neoplasm 3.  Breast cancer: Currently undergoing chemotherapy  4.  Constipation: Resolved now, likely due to chemo at the time and Zofran  Plan: 1.  Patient would like to wait until March when she finishes her oral chemotherapy in  order to schedule her screening colonoscopy.  We schedule this today with Dr. Candis Schatz.  Did provide the patient a detailed list of risks for the procedure and she agrees to proceed.  She would also like to  discuss it with her oncologist prior to having it done. Patient is appropriate for endoscopic procedure(s) in the ambulatory ([place]) setting.  2.  Continue MiraLAX as needed 3.  Patient asked about her hemorrhoids, these are not bothering her now but she would like them evaluated at time of colonoscopy for possible banding in the future. 4.  Patient to follow in clinic per recommendations after time of colonoscopy.  Did tell her to call if she has any other symptoms before then.  Ellouise Newer, PA-C [facility] 10/05/2021, 8:30 AM  Cc: Antony Contras, MD

## 2021-10-05 NOTE — Progress Notes (Signed)
[facility] CSW Progress Note  Clinical Education officer, museum received e-mail from patient via nurse navigator asking about financial assistance.  CSW shared information on breast cancer foundations (previously shared by intern) as well as the contact information for [employer]. [employer], Ar[employer]urist.  Provided direct number for pt to contact with any questions.    Christeen Douglas , LCSW

## 2021-10-05 NOTE — Patient Instructions (Signed)
You have been scheduled for a colonoscopy. Please follow written instructions given to you at your visit today.  Please pick up your prep supplies at the pharmacy within the next 1-3 days. If you use inhalers (even only as needed), please bring them with you on the day of your procedure.  If you are age 45 or older, your body mass index should be between 23-30. Your Body mass index is 19.37 kg/m. If this is out of the aforementioned range listed, please consider follow up with your Primary Care Provider.  If you are age 40 or younger, your body mass index should be between 19-25. Your Body mass index is 19.37 kg/m. If this is out of the aformentioned range listed, please consider follow up with your Primary Care Provider.   ________________________________________________________  The [facility] providers would like to encourage you to use [facility] to communicate with providers for non-urgent requests or questions.  Due to long hold times on the telephone, sending your provider a message by [facility] may be a faster and more efficient way to get a response.  Please allow 48 business hours for a response.  Please remember that this is for non-urgent requests.  _______________________________________________________

## 2021-10-06 ENCOUNTER — Other Ambulatory Visit: Payer: Self-pay | Admitting: Hematology and Oncology

## 2021-10-06 ENCOUNTER — Encounter: Payer: Self-pay | Admitting: Hematology and Oncology

## 2021-10-06 NOTE — Progress Notes (Signed)
Agree with the assessment and plan as outlined by Jennifer Lemmon, PA-C. ? ?Shylynn Bruning E. Destynee Stringfellow, MD ? ?

## 2021-10-06 NOTE — Progress Notes (Signed)
Pt called back to inquire about financial assistance and gave me consent to apply in her behalf so I enrolled her in the copay program for Ziextenzo thru [place].  The maximum benefit is $10,000 annually.  Pt's OOP is $0 for each dose or cycle.

## 2021-10-07 NOTE — Progress Notes (Addendum)
Patient Care Team: Antony Contras, MD as PCP - General (Family Medicine) Eppie Gibson, MD as Attending Physician (Radiation Oncology) Rockwell Germany, RN as Oncology Nurse Navigator Mauro Kaufmann, RN as Oncology Nurse Navigator Sanjuana Kava, MD as Referring Physician (Obstetrics and Gynecology) [name], MD as Consulting Physician (Plastic Surgery) Nicholas Lose, MD as Consulting Physician (Hematology and Oncology) Rolm Bookbinder, MD as Consulting Physician (General Surgery)  DIAGNOSIS:    ICD-10-CM   1. Malignant neoplasm of upper-outer quadrant of right breast in female, estrogen receptor positive ([place])  C50.411 MR BREAST BILATERAL W [place] CAD   Z17.0       SUMMARY OF ONCOLOGIC HISTORY: Oncology History  Malignant neoplasm of upper-outer quadrant of right breast in female, estrogen receptor positive ([place])  06/04/2021 Initial Diagnosis   status post right breast upper outer quadrant biopsy 06/04/2021 for a clinical mT2 N0 invasive ductal carcinoma, grade 3, estrogen and progesterone receptor inadequate (functionally triple negative with HER2 not amplified), with an MIB-1 of 70%             (A) chest CT with contrast 06/22/2021 shows right breast mass, no other lesions of concern; pulmonary nodules all less than 5 mm noted, likely benign             (B) bone scan 06/22/2021 negative for metastases; consistent with sacroiliitis   06/10/2021 Cancer Staging   Staging form: Breast, AJCC 8th Edition - Clinical stage from 06/10/2021: Stage IIA (cT2, cN0, cM0, G3, ER+, PR+, HER2-) - Signed by Chauncey Cruel, MD on 06/10/2021 Stage prefix: Initial diagnosis Histologic grading system: 3 grade system Laterality: Right Staged by: Pathologist and managing physician Stage used in treatment planning: Yes National guidelines used in treatment planning: Yes Type of national guideline used in treatment planning: NCCN    06/16/2021 Genetic Testing   [facility]  CancerNext-Expanded was negative. No pathogenic variants were identified. 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 Report date is 06/19/2021.  The CancerNext-Expanded gene panel offered by [facility] and includes sequencing, rearrangement, and RNA analysis for the following 77 genes: AIP, ALK, APC, ATM, AXIN2, BAP1, BARD1, BLM, BMPR1A, BRCA1, BRCA2, BRIP1, CDC73, CDH1, CDK4, CDKN1B, CDKN2A, CHEK2, CTNNA1, DICER1, FANCC, FH, FLCN, GALNT12, KIF1B, LZTR1, MAX, MEN1, MET, MLH1, MSH2, MSH3, MSH6, MUTYH, NBN, NF1, NF2, NTHL1, PALB2, PHOX2B, PMS2, POT1, PRKAR1A, PTCH1, PTEN, RAD51C, RAD51D, RB1, RECQL, RET, SDHA, SDHAF2, SDHB, SDHC, SDHD, SMAD4, SMARCA4, SMARCB1, SMARCE1, STK11, SUFU, TMEM127, TP53, TSC1, TSC2, VHL and XRCC2 (sequencing and deletion/duplication); EGFR, EGLN1, HOXB13, KIT, MITF, PDGFRA, POLD1, and POLE (sequencing only); EPCAM and GREM1 (deletion/duplication only).    06/25/2021 -  Neo-Adjuvant Chemotherapy   neoadjuvant chemotherapy consisting of doxorubicin and cyclophosphamide in dose dense fashion x4 started 0/13/2022, completed 08/10/2021, to be followed by weekly carboplatin and paclitaxel x12 starting 08/24/2021     CHIEF COMPLIANT: Cycle 7 Carboplatin and Taxol  INTERVAL HISTORY: BRISTYL MCLEES is a 46 y.o. with above-mentioned history of weakly estrogen receptor positive breast cancer.  She is currently on neoadjuvant chemotherapy. She presents to the clinic today for treatment.  She denies any peripheral neuropathy.  The drug fevers have not come back.  She had multiple questions today about breast reconstruction options.  She wants to get the neck steps started so that we can arrange for the breast MRI and surgery appointments.  ALLERGIES:  is allergic to cefepime.  MEDICATIONS:  Current Outpatient Medications  Medication Sig Dispense  Refill   ciprofloxacin (CIPRO) 500 MG tablet Take 1 tablet (500 mg total) by mouth 2 (two) times daily. 14  tablet 0   lidocaine-prilocaine (EMLA) cream Apply to affected area once 30 g 3   loratadine (CLARITIN) 10 MG tablet Take 1 tablet (10 mg total) by mouth daily. Take day post chemo and then daily for 10 days with each treatment 30 tablet 3   LORazepam (ATIVAN) 0.5 MG tablet Take 1 tablet (0.5 mg total) by mouth at bedtime as needed (Nausea or vomiting). 30 tablet 0   metoCLOPramide (REGLAN) 5 MG tablet Take 1 tablet (5 mg total) by mouth every 6 (six) hours as needed for nausea. 90 tablet 1   prochlorperazine (COMPAZINE) 10 MG tablet Take 1 tablet (10 mg total) by mouth every 6 (six) hours as needed (Nausea or vomiting). 30 tablet 1   No current facility-administered medications for this visit.   Facility-Administered Medications Ordered in Other Visits  Medication Dose Route Frequency Provider Last Rate Last Admin   influenza vac split quadrivalent PF (FLUARIX) 0.5 ML injection            influenza vac split quadrivalent PF (FLUARIX) injection 0.5 mL  0.5 mL Intramuscular Once Magrinat, Virgie Dad, MD        PHYSICAL EXAMINATION: ECOG PERFORMANCE STATUS: 1 - Symptomatic but completely ambulatory  Vitals:   10/09/21 0841  BP: 114/77  Pulse: 93  Resp: 18  Temp: (!) 97.4 F (36.3 C)  SpO2: 100%   Filed Weights   10/09/21 0841  Weight: 120 lb 3.2 oz (54.5 kg)    LABORATORY DATA:  I have reviewed the data as listed CMP Latest Ref Rng & Units 10/02/2021 09/24/2021 09/18/2021  Glucose 70 - 99 mg/dL 107(H) 97 102(H)  BUN 6 - 20 mg/dL '6 13 11  ' Creatinine 0.44 - 1.00 mg/dL 0.57 0.58 0.60  Sodium 135 - 145 mmol/L 141 139 139  Potassium 3.5 - 5.1 mmol/L 3.7 4.0 3.9  Chloride 98 - 111 mmol/L 110 108 106  CO2 22 - 32 mmol/L '25 26 26  ' Calcium 8.9 - 10.3 mg/dL 9.3 9.2 9.5  Total Protein 6.5 - 8.1 g/dL 6.6 6.3(L) 6.9  Total Bilirubin 0.3 - 1.2 mg/dL 0.6 0.6 0.5  Alkaline Phos 38 - 126 U/L 70 79 86  AST 15 - 41 U/L 34 28 46(H)  ALT 0 - 44 U/L 34 43 71(H)    Lab Results  Component Value  Date   WBC 2.1 (L) 10/09/2021   HGB 10.8 (L) 10/09/2021   HCT 30.7 (L) 10/09/2021   MCV 98.1 10/09/2021   PLT 182 10/09/2021   NEUTROABS 1.0 (L) 10/09/2021    ASSESSMENT & PLAN:  Malignant neoplasm of upper-outer quadrant of right breast in female, estrogen receptor positive ([place]) right breast upper outer quadrant biopsy 06/04/2021 for a clinical mT2 N0 invasive ductal carcinoma, grade 3, estrogen and progesterone receptor weakly positive, HER2 not amplified, with an MIB-1 of 70% CT CAP and Bone Scan: Neg   Treatment Plan: 1. Neo adj chemo with AC X 4 followed by Taxol and Carboplatin weekly X 12 2. Breast Conserving surgery with SLN surgery 3. Adjuvant XRT 4. Adj Anti estrogen therapy ------------------------------------------------------------------------------------------------------------------- Current Treatment: Completed 5 cycles of AC, today is cycle 7 Taxol with Carboplatin Chemo Toxicities: Fatigue Chemo induced anemia: Hemoglobin was 10.2 on 08/24/2021 Leukopenia: ANC 1: Okay to treat today.  Taxol and Carbo has been dose reduced again today.  Adding Granix to her  treatment regimen every Wednesday. Fevers following chemotherapy: She was given a prescription for antibiotics if her fever comes back..    I set her up for a breast MRI to be done 11/09/2021. I will request Dr. Donne Hazel to see her after the MRI.  Orders Placed This Encounter  Procedures   MR BREAST BILATERAL W WO CONTRAST INC CAD    Standing Status:   Future    Standing Expiration Date:   10/09/2022    Order Specific Question:   If indicated for the ordered procedure, I authorize the administration of contrast media per Radiology protocol    Answer:   Yes    Order Specific Question:   What is the patient's sedation requirement?    Answer:   No Sedation    Order Specific Question:   Does the patient have a pacemaker or implanted devices?    Answer:   No    Order Specific Question:   Preferred imaging  location?    Answer:   [facility] (table limit-550lbs)   The patient has a good understanding of the overall plan. she agrees with it. she will call with any problems that may develop before the next visit here.  Total time spent: 30 mins including face to face time and time spent for planning, charting and coordination of care  Rulon Eisenmenger, MD, MPH 10/09/2021  I, Thana Ates, am acting as scribe for Dr. Nicholas Lose.  I have reviewed the above documentation for accuracy and completeness, and I agree with the above.

## 2021-10-08 ENCOUNTER — Other Ambulatory Visit: Payer: No Typology Code available for payment source

## 2021-10-08 ENCOUNTER — Ambulatory Visit: Payer: No Typology Code available for payment source

## 2021-10-08 ENCOUNTER — Ambulatory Visit: Payer: No Typology Code available for payment source | Admitting: Hematology and Oncology

## 2021-10-08 MED FILL — Dexamethasone Sodium Phosphate Inj 100 MG/10ML: INTRAMUSCULAR | Qty: 1 | Status: AC

## 2021-10-09 ENCOUNTER — Inpatient Hospital Stay: Payer: No Typology Code available for payment source

## 2021-10-09 ENCOUNTER — Other Ambulatory Visit: Payer: Self-pay

## 2021-10-09 ENCOUNTER — Encounter: Payer: Self-pay | Admitting: *Deleted

## 2021-10-09 ENCOUNTER — Inpatient Hospital Stay (HOSPITAL_BASED_OUTPATIENT_CLINIC_OR_DEPARTMENT_OTHER): Payer: No Typology Code available for payment source | Admitting: Hematology and Oncology

## 2021-10-09 DIAGNOSIS — Z170 Estrogen receptor positive status [ER+]: Secondary | ICD-10-CM

## 2021-10-09 DIAGNOSIS — Z95828 Presence of other vascular implants and grafts: Secondary | ICD-10-CM

## 2021-10-09 DIAGNOSIS — C50411 Malignant neoplasm of upper-outer quadrant of right female breast: Secondary | ICD-10-CM

## 2021-10-09 LAB — CBC WITH DIFFERENTIAL (CANCER CENTER ONLY)
Abs Immature Granulocytes: 0 10*3/uL (ref 0.00–0.07)
Basophils Absolute: 0 10*3/uL (ref 0.0–0.1)
Basophils Relative: 1 %
Eosinophils Absolute: 0 10*3/uL (ref 0.0–0.5)
Eosinophils Relative: 2 %
HCT: 30.7 % — ABNORMAL LOW (ref 36.0–46.0)
Hemoglobin: 10.8 g/dL — ABNORMAL LOW (ref 12.0–15.0)
Immature Granulocytes: 0 %
Lymphocytes Relative: 47 %
Lymphs Abs: 1 10*3/uL (ref 0.7–4.0)
MCH: 34.5 pg — ABNORMAL HIGH (ref 26.0–34.0)
MCHC: 35.2 g/dL (ref 30.0–36.0)
MCV: 98.1 fL (ref 80.0–100.0)
Monocytes Absolute: 0.1 10*3/uL (ref 0.1–1.0)
Monocytes Relative: 5 %
Neutro Abs: 1 10*3/uL — ABNORMAL LOW (ref 1.7–7.7)
Neutrophils Relative %: 45 %
Platelet Count: 182 10*3/uL (ref 150–400)
RBC: 3.13 MIL/uL — ABNORMAL LOW (ref 3.87–5.11)
RDW: 13 % (ref 11.5–15.5)
WBC Count: 2.1 10*3/uL — ABNORMAL LOW (ref 4.0–10.5)
nRBC: 0 % (ref 0.0–0.2)

## 2021-10-09 LAB — CMP (CANCER CENTER ONLY)
ALT: 34 U/L (ref 0–44)
AST: 33 U/L (ref 15–41)
Albumin: 4.1 g/dL (ref 3.5–5.0)
Alkaline Phosphatase: 73 U/L (ref 38–126)
Anion gap: 8 (ref 5–15)
BUN: 9 mg/dL (ref 6–20)
CO2: 24 mmol/L (ref 22–32)
Calcium: 9.1 mg/dL (ref 8.9–10.3)
Chloride: 108 mmol/L (ref 98–111)
Creatinine: 0.6 mg/dL (ref 0.44–1.00)
GFR, Estimated: >60 mL/min (ref >60)
Glucose, Bld: 141 mg/dL — ABNORMAL HIGH (ref 70–99)
Potassium: 3.8 mmol/L (ref 3.5–5.1)
Sodium: 140 mmol/L (ref 135–145)
Total Bilirubin: 0.4 mg/dL (ref 0.3–1.2)
Total Protein: 6.4 g/dL — ABNORMAL LOW (ref 6.5–8.1)

## 2021-10-09 LAB — PREGNANCY, URINE: Preg Test, Ur: NEGATIVE

## 2021-10-09 MED ORDER — SODIUM CHLORIDE 0.9 % IV SOLN
10.0000 mg | Freq: Once | INTRAVENOUS | Status: AC
  Administered 2021-10-09: 10 mg via INTRAVENOUS
  Filled 2021-10-09: qty 10

## 2021-10-09 MED ORDER — SODIUM CHLORIDE 0.9% FLUSH
10.0000 mL | INTRAVENOUS | Status: DC | PRN
  Administered 2021-10-09: 10 mL

## 2021-10-09 MED ORDER — SODIUM CHLORIDE 0.9 % IV SOLN
45.0000 mg/m2 | Freq: Once | INTRAVENOUS | Status: AC
  Administered 2021-10-09: 72 mg via INTRAVENOUS
  Filled 2021-10-09: qty 12

## 2021-10-09 MED ORDER — SODIUM CHLORIDE 0.9 % IV SOLN
105.3000 mg | Freq: Once | INTRAVENOUS | Status: AC
  Administered 2021-10-09: 110 mg via INTRAVENOUS
  Filled 2021-10-09: qty 11

## 2021-10-09 MED ORDER — SODIUM CHLORIDE 0.9% FLUSH
10.0000 mL | Freq: Once | INTRAVENOUS | Status: AC
  Administered 2021-10-09: 10 mL

## 2021-10-09 MED ORDER — DIPHENHYDRAMINE HCL 50 MG/ML IJ SOLN
25.0000 mg | Freq: Once | INTRAMUSCULAR | Status: AC
  Administered 2021-10-09: 25 mg via INTRAVENOUS
  Filled 2021-10-09: qty 1

## 2021-10-09 MED ORDER — FAMOTIDINE 20 MG IN NS 100 ML IVPB
20.0000 mg | Freq: Once | INTRAVENOUS | Status: AC
  Administered 2021-10-09: 20 mg via INTRAVENOUS
  Filled 2021-10-09: qty 100

## 2021-10-09 MED ORDER — HEPARIN SOD (PORK) LOCK FLUSH 100 UNIT/ML IV SOLN
500.0000 [IU] | Freq: Once | INTRAVENOUS | Status: AC | PRN
  Administered 2021-10-09: 500 [IU]

## 2021-10-09 MED ORDER — SODIUM CHLORIDE 0.9 % IV SOLN: Freq: Once | INTRAVENOUS | Status: AC

## 2021-10-09 NOTE — Patient Instructions (Signed)
[facility]  Discharge Instructions: Thank you for choosing [facility] to provide your oncology and hematology care.   If you have a lab appointment with the Cancer Center, please go directly to the Cancer Center and check in at the registration area.   Wear comfortable clothing and clothing appropriate for easy access to any Portacath or PICC line.   We strive to give you quality time with your provider. You may need to reschedule your appointment if you arrive late (15 or more minutes).  Arriving late affects you and other patients whose appointments are after yours.  Also, if you miss three or more appointments without notifying the office, you may be dismissed from the clinic at the provider's discretion.      For prescription refill requests, have your pharmacy contact our office and allow 72 hours for refills to be completed.    Today you received the following chemotherapy and/or immunotherapy agents: Taxol & Carboplatin   To help prevent nausea and vomiting after your treatment, we encourage you to take your nausea medication as directed.  BELOW ARE SYMPTOMS THAT SHOULD BE REPORTED IMMEDIATELY: *FEVER GREATER THAN 100.4 F (38 C) OR HIGHER *CHILLS OR SWEATING *NAUSEA AND VOMITING THAT IS NOT CONTROLLED WITH YOUR NAUSEA MEDICATION *UNUSUAL SHORTNESS OF BREATH *UNUSUAL BRUISING OR BLEEDING *URINARY PROBLEMS (pain or burning when urinating, or frequent urination) *BOWEL PROBLEMS (unusual diarrhea, constipation, pain near the anus) TENDERNESS IN MOUTH AND THROAT WITH OR WITHOUT PRESENCE OF ULCERS (sore throat, sores in mouth, or a toothache) UNUSUAL RASH, SWELLING OR PAIN  UNUSUAL VAGINAL DISCHARGE OR ITCHING   Items with * indicate a potential emergency and should be followed up as soon as possible or go to the Emergency Department if any problems should occur.  Please show the CHEMOTHERAPY ALERT CARD or IMMUNOTHERAPY ALERT CARD at  check-in to the Emergency Department and triage nurse.  Should you have questions after your visit or need to cancel or reschedule your appointment, please contact [facility]  Dept: [phone]  and follow the prompts.  Office hours are 8:00 a.m. to 4:30 p.m. Monday - Friday. Please note that voicemails left after 4:00 p.m. may not be returned until the following business day.  We are closed weekends and major holidays. You have access to a nurse at all times for urgent questions. Please call the main number to the clinic Dept: [phone] and follow the prompts.   For any non-urgent questions, you may also contact your provider using MyChart. We now offer e-Visits for anyone 18 and older to request care online for non-urgent symptoms. For details visit [email or web address].   Also download the MyChart app! Go to the app store, search "MyChart", open the app, select [facility], and log in with your MyChart username and password.  Due to Covid, a mask is required upon entering the hospital/clinic. If you do not have a mask, one will be given to you upon arrival. For doctor visits, patients may have 1 support person aged 18 or older with them. For treatment visits, patients cannot have anyone with them due to current Covid guidelines and our immunocompromised population.   

## 2021-10-09 NOTE — Assessment & Plan Note (Signed)
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 Neo adj chemo with AC X 4 followed by Taxol and Carboplatin weekly X 12 2. Breast Conserving surgery with SLN surgery 3. Adjuvant XRT 4. Adj Anti estrogen therapy ------------------------------------------------------------------------------------------------------------------- Current Treatment: Completed 5 cycles of AC, today iscycle7Taxol with Carboplatin Chemo Toxicities: 1. Fatigue 2. Chemo induced anemia: Hemoglobin was 10.2 on 08/24/2021 3. Leukopenia: ANC 1.6: Okay to treat today. Taxol and Carbo has been dose reduced.  Will follow closely.   4. Fevers following chemotherapy: Could be drug fever.  She will continue with treatment and will take tylenol if needed for her fever.

## 2021-10-09 NOTE — Progress Notes (Signed)
Ok to treat with ANC of 1.0 per Dr.Gudena

## 2021-10-12 ENCOUNTER — Encounter: Payer: Self-pay | Admitting: Hematology and Oncology

## 2021-10-14 ENCOUNTER — Inpatient Hospital Stay: Payer: No Typology Code available for payment source | Admitting: Hematology and Oncology

## 2021-10-14 ENCOUNTER — Inpatient Hospital Stay: Payer: No Typology Code available for payment source | Attending: Oncology

## 2021-10-14 ENCOUNTER — Other Ambulatory Visit: Payer: Self-pay

## 2021-10-14 VITALS — BP 104/88 | HR 87 | Temp 98.6°F | Resp 16

## 2021-10-14 DIAGNOSIS — Z95828 Presence of other vascular implants and grafts: Secondary | ICD-10-CM

## 2021-10-14 DIAGNOSIS — R202 Paresthesia of skin: Secondary | ICD-10-CM | POA: Insufficient documentation

## 2021-10-14 DIAGNOSIS — D6481 Anemia due to antineoplastic chemotherapy: Secondary | ICD-10-CM | POA: Insufficient documentation

## 2021-10-14 DIAGNOSIS — Z79899 Other long term (current) drug therapy: Secondary | ICD-10-CM | POA: Insufficient documentation

## 2021-10-14 DIAGNOSIS — Z170 Estrogen receptor positive status [ER+]: Secondary | ICD-10-CM | POA: Diagnosis not present

## 2021-10-14 DIAGNOSIS — C50411 Malignant neoplasm of upper-outer quadrant of right female breast: Secondary | ICD-10-CM | POA: Diagnosis present

## 2021-10-14 DIAGNOSIS — D72819 Decreased white blood cell count, unspecified: Secondary | ICD-10-CM | POA: Insufficient documentation

## 2021-10-14 DIAGNOSIS — R5383 Other fatigue: Secondary | ICD-10-CM | POA: Insufficient documentation

## 2021-10-14 DIAGNOSIS — R42 Dizziness and giddiness: Secondary | ICD-10-CM | POA: Diagnosis not present

## 2021-10-14 MED ORDER — FILGRASTIM-AAFI 300 MCG/0.5ML IJ SOSY
300.0000 ug | PREFILLED_SYRINGE | Freq: Once | INTRAMUSCULAR | Status: AC
  Administered 2021-10-14: 300 ug via SUBCUTANEOUS
  Filled 2021-10-14: qty 0.5

## 2021-10-14 NOTE — Patient Instructions (Signed)
Filgrastim, G-CSF injection °What is this medication? °FILGRASTIM, G-CSF (fil GRA stim) is a granulocyte colony-stimulating factor that stimulates the growth of neutrophils, a type of white blood cell (WBC) important in the body's fight against infection. It is used to reduce the incidence of fever and infection in patients with certain types of cancer who are receiving chemotherapy that affects the bone marrow, to stimulate blood cell production for removal of WBCs from the body prior to a bone marrow transplantation, to reduce the incidence of fever and infection in patients who have severe chronic neutropenia, and to improve survival outcomes following high-dose radiation exposure that is toxic to the bone marrow. °This medicine may be used for other purposes; ask your health care provider or pharmacist if you have questions. °COMMON BRAND NAME(S): Neupogen, Nivestym, Releuko, Zarxio °What should I tell my care team before I take this medication? °They need to know if you have any of these conditions: °kidney disease °latex allergy °ongoing radiation therapy °sickle cell disease °an unusual or allergic reaction to filgrastim, pegfilgrastim, other medicines, foods, dyes, or preservatives °pregnant or trying to get pregnant °breast-feeding °How should I use this medication? °This medicine is for injection under the skin or infusion into a vein. As an infusion into a vein, it is usually given by a health care professional in a hospital or clinic setting. If you get this medicine at home, you will be taught how to prepare and give this medicine. Refer to the Instructions for Use that come with your medication packaging. Use exactly as directed. Take your medicine at regular intervals. Do not take your medicine more often than directed. °It is important that you put your used needles and syringes in a special sharps container. Do not put them in a trash can. If you do not have a sharps container, call your pharmacist  or healthcare provider to get one. °Talk to your pediatrician regarding the use of this medicine in children. While this drug may be prescribed for children as young as 7 months for selected conditions, precautions do apply. °Overdosage: If you think you have taken too much of this medicine contact a poison control center or emergency room at once. °NOTE: This medicine is only for you. Do not share this medicine with others. °What if I miss a dose? °It is important not to miss your dose. Call your doctor or health care professional if you miss a dose. °What may interact with this medication? °This medicine may interact with the following medications: °medicines that may cause a release of neutrophils, such as lithium °This list may not describe all possible interactions. Give your health care provider a list of all the medicines, herbs, non-prescription drugs, or dietary supplements you use. Also tell them if you smoke, drink alcohol, or use illegal drugs. Some items may interact with your medicine. °What should I watch for while using this medication? °Your condition will be monitored carefully while you are receiving this medicine. °You may need blood work done while you are taking this medicine. °Talk to your health care provider about your risk of cancer. You may be more at risk for certain types of cancer if you take this medicine. °What side effects may I notice from receiving this medication? °Side effects that you should report to your doctor or health care professional as soon as possible: °allergic reactions like skin rash, itching or hives, swelling of the face, lips, or tongue °back pain °dizziness or feeling faint °fever °pain, redness, or   irritation at site where injected °pinpoint red spots on the skin °shortness of breath or breathing problems °signs and symptoms of kidney injury like trouble passing urine, change in the amount of urine, or red or dark-brown urine °stomach or side pain, or pain at  the shoulder °swelling °tiredness °unusual bleeding or bruising °Side effects that usually do not require medical attention (report to your doctor or health care professional if they continue or are bothersome): °bone pain °cough °diarrhea °hair loss °headache °muscle pain °This list may not describe all possible side effects. Call your doctor for medical advice about side effects. You may report side effects to FDA at 1-800-FDA-1088. °Where should I keep my medication? °Keep out of the reach of children. °Store in a refrigerator between 2 and 8 degrees C (36 and 46 degrees F). Do not freeze. Keep in carton to protect from light. Throw away this medicine if vials or syringes are left out of the refrigerator for more than 24 hours. Throw away any unused medicine after the expiration date. °NOTE: This sheet is a summary. It may not cover all possible information. If you have questions about this medicine, talk to your doctor, pharmacist, or health care provider. °© 2022 [employer]vier/Gold Standard (2021-05-19 00:00:00) ° °

## 2021-10-15 ENCOUNTER — Ambulatory Visit: Payer: No Typology Code available for payment source

## 2021-10-15 ENCOUNTER — Other Ambulatory Visit: Payer: No Typology Code available for payment source

## 2021-10-15 ENCOUNTER — Other Ambulatory Visit: Payer: Self-pay

## 2021-10-15 DIAGNOSIS — Z95828 Presence of other vascular implants and grafts: Secondary | ICD-10-CM

## 2021-10-15 DIAGNOSIS — C50411 Malignant neoplasm of upper-outer quadrant of right female breast: Secondary | ICD-10-CM

## 2021-10-15 MED FILL — Dexamethasone Sodium Phosphate Inj 100 MG/10ML: INTRAMUSCULAR | Qty: 1 | Status: AC

## 2021-10-15 NOTE — Assessment & Plan Note (Signed)
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 Neo adj chemo with AC X 4 followed by Taxol and Carboplatin weekly X 12 2. Breast Conserving surgery with SLN surgery 3. Adjuvant XRT 4. Adj Anti estrogen therapy ------------------------------------------------------------------------------------------------------------------- Current Treatment: Completed5cycles of AC, today iscycle8Taxol with Carboplatin Chemo Toxicities: 1. Fatigue 2. Chemo induced anemia: Hemoglobin was 10.2 on 08/24/2021 3. Leukopenia: ANC 1: Okay to treat today.Taxol and Carbo has been dose reduced again today.  Adding Granix to her treatment regimen every Wednesday. 4. Fevers following chemotherapy: She was given a prescription for antibiotics if her fever comes back..   I set her up for a breast MRI to be done 11/09/2021. I will request Dr. Donne Hazel to see her after the MRI.

## 2021-10-15 NOTE — Progress Notes (Signed)
Patient Care Team: Antony Contras, MD as PCP - General (Family Medicine) Eppie Gibson, MD as Attending Physician (Radiation Oncology) Rockwell Germany, RN as Oncology Nurse Navigator Mauro Kaufmann, RN as Oncology Nurse Navigator Sanjuana Kava, MD as Referring Physician (Obstetrics and Gynecology) [name], MD as Consulting Physician (Plastic Surgery) Nicholas Lose, MD as Consulting Physician (Hematology and Oncology) Rolm Bookbinder, MD as Consulting Physician (General Surgery)  DIAGNOSIS:    ICD-10-CM   1. Malignant neoplasm of upper-outer quadrant of right breast in female, estrogen receptor positive ([place])  C50.411    Z17.0       SUMMARY OF ONCOLOGIC HISTORY: Oncology History  Malignant neoplasm of upper-outer quadrant of right breast in female, estrogen receptor positive ([place])  06/04/2021 Initial Diagnosis   status post right breast upper outer quadrant biopsy 06/04/2021 for a clinical mT2 N0 invasive ductal carcinoma, grade 3, estrogen and progesterone receptor inadequate (functionally triple negative with HER2 not amplified), with an MIB-1 of 70%             (A) chest CT with contrast 06/22/2021 shows right breast mass, no other lesions of concern; pulmonary nodules all less than 5 mm noted, likely benign             (B) bone scan 06/22/2021 negative for metastases; consistent with sacroiliitis   06/10/2021 Cancer Staging   Staging form: Breast, AJCC 8th Edition - Clinical stage from 06/10/2021: Stage IIA (cT2, cN0, cM0, G3, ER+, PR+, HER2-) - Signed by Chauncey Cruel, MD on 06/10/2021 Stage prefix: Initial diagnosis Histologic grading system: 3 grade system Laterality: Right Staged by: Pathologist and managing physician Stage used in treatment planning: Yes National guidelines used in treatment planning: Yes Type of national guideline used in treatment planning: NCCN    06/16/2021 Genetic Testing   [facility] CancerNext-Expanded was negative. No pathogenic variants  were identified. 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 Report date is 06/19/2021.  The CancerNext-Expanded gene panel offered by [facility] and includes sequencing, rearrangement, and RNA analysis for the following 77 genes: AIP, ALK, APC, ATM, AXIN2, BAP1, BARD1, BLM, BMPR1A, BRCA1, BRCA2, BRIP1, CDC73, CDH1, CDK4, CDKN1B, CDKN2A, CHEK2, CTNNA1, DICER1, FANCC, FH, FLCN, GALNT12, KIF1B, LZTR1, MAX, MEN1, MET, MLH1, MSH2, MSH3, MSH6, MUTYH, NBN, NF1, NF2, NTHL1, PALB2, PHOX2B, PMS2, POT1, PRKAR1A, PTCH1, PTEN, RAD51C, RAD51D, RB1, RECQL, RET, SDHA, SDHAF2, SDHB, SDHC, SDHD, SMAD4, SMARCA4, SMARCB1, SMARCE1, STK11, SUFU, TMEM127, TP53, TSC1, TSC2, VHL and XRCC2 (sequencing and deletion/duplication); EGFR, EGLN1, HOXB13, KIT, MITF, PDGFRA, POLD1, and POLE (sequencing only); EPCAM and GREM1 (deletion/duplication only).    06/25/2021 -  Neo-Adjuvant Chemotherapy   neoadjuvant chemotherapy consisting of doxorubicin and cyclophosphamide in dose dense fashion x4 started 0/13/2022, completed 08/10/2021, to be followed by weekly carboplatin and paclitaxel x12 starting 08/24/2021     CHIEF COMPLIANT: Cycle 8 Carboplatin and Taxol  INTERVAL HISTORY: Ebony Lamb is a 46 y.o. with above-mentioned history of weakly estrogen receptor positive breast cancer. She is currently on neoadjuvant chemotherapy. She presents to the clinic today for treatment.  She does not report any new problems or concerns.  Denies any peripheral neuropathy.  Energy levels are starting to improve slowly.  ALLERGIES:  is allergic to cefepime.  MEDICATIONS:  Current Outpatient Medications  Medication Sig Dispense Refill   ciprofloxacin (CIPRO) 500 MG tablet Take 1 tablet (500 mg total) by mouth 2 (two) times daily. 14 tablet 0   lidocaine-prilocaine (EMLA) cream Apply to  affected area once 30 g 3   loratadine (CLARITIN) 10 MG tablet Take 1 tablet (10 mg total) by mouth daily. Take day post chemo  and then daily for 10 days with each treatment 30 tablet 3   LORazepam (ATIVAN) 0.5 MG tablet Take 1 tablet (0.5 mg total) by mouth at bedtime as needed (Nausea or vomiting). 30 tablet 0   metoCLOPramide (REGLAN) 5 MG tablet Take 1 tablet (5 mg total) by mouth every 6 (six) hours as needed for nausea. 90 tablet 1   prochlorperazine (COMPAZINE) 10 MG tablet Take 1 tablet (10 mg total) by mouth every 6 (six) hours as needed (Nausea or vomiting). 30 tablet 1   No current facility-administered medications for this visit.   Facility-Administered Medications Ordered in Other Visits  Medication Dose Route Frequency Provider Last Rate Last Admin   influenza vac split quadrivalent PF (FLUARIX) 0.5 ML injection            influenza vac split quadrivalent PF (FLUARIX) injection 0.5 mL  0.5 mL Intramuscular Once Ebony Lamb, Ebony Dad, MD        PHYSICAL EXAMINATION: ECOG PERFORMANCE STATUS: 1 - Symptomatic but completely ambulatory  Vitals:   10/16/21 0839  BP: (!) 128/91  Pulse: 97  Resp: 16  Temp: (!) 97.3 F (36.3 C)  SpO2: 100%   Filed Weights   10/16/21 0839  Weight: 120 lb (54.4 kg)    LABORATORY DATA:  I have reviewed the data as listed CMP Latest Ref Rng & Units 10/09/2021 10/02/2021 09/24/2021  Glucose 70 - 99 mg/dL 141(H) 107(H) 97  BUN 6 - 20 mg/dL _0 Creatinine 0.44 - 1.00 mg/dL 0.60 0.57 0.58  Sodium 135 - 145 mmol/L 140 141 139  Potassium 3.5 - 5.1 mmol/L 3.8 3.7 4.0  Chloride 98 - 111 mmol/L 108 110 108  CO2 22 - 32 mmol/L _1 Calcium 8.9 - 10.3 mg/dL 9.1 9.3 9.2  Total Protein 6.5 - 8.1 g/dL 6.4(L) 6.6 6.3(L)  Total Bilirubin 0.3 - 1.2 mg/dL 0.4 0.6 0.6  Alkaline Phos 38 - 126 U/L 73 70 79  AST 15 - 41 U/L 33 34 28  ALT 0 - 44 U/L 34 34 43    Lab Results  Component Value Date   WBC 6.8 10/16/2021   HGB 11.1 (L) 10/16/2021   HCT 31.6 (L) 10/16/2021   MCV 97.5 10/16/2021   PLT 193 10/16/2021   NEUTROABS 5.3 10/16/2021    ASSESSMENT & PLAN:  Malignant  neoplasm of upper-outer quadrant of right breast in female, estrogen receptor positive ([place]) right breast upper outer quadrant biopsy 06/04/2021 for a clinical mT2 N0 invasive ductal carcinoma, grade 3, estrogen and progesterone receptor weakly positive, HER2 not amplified, with an MIB-1 of 70% CT CAP and Bone Scan: Neg   Treatment Plan: 1. Neo adj chemo with AC X 4 followed by Taxol and Carboplatin weekly X 12 2. Breast Conserving surgery with SLN surgery 3. Adjuvant XRT 4. Adj Anti estrogen therapy ------------------------------------------------------------------------------------------------------------------- Current Treatment: Completed 4 cycles of AC, today is cycle 8 Taxol with Carboplatin Chemo Toxicities: Fatigue Chemo induced anemia: Hemoglobin was 10.2 on 08/24/2021 Leukopenia: ANC 1: Okay to treat today.  Taxol and Carbo has been dose reduced again today.  Adding Granix to her treatment regimen every Wednesday. Fevers following chemotherapy: She was given a prescription for antibiotics if her fever comes back..     I set her up for a breast MRI to be  done 11/09/2021. I will request Dr. Donne Hazel to see her after the MRI. She plans to do a mastectomy and expanders in [place] and then undergo a cutaneous flap based reconstruction at [facility].  However she is not 852% certain on that because if she does not have to go through radiation then she may not need the flap reconstruction.   No orders of the defined types were placed in this encounter.  The patient has a good understanding of the overall plan. she agrees with it. she will call with any problems that may develop before the next visit here.  Total time spent: 30 mins including face to face time and time spent for planning, charting and coordination of care  Rulon Eisenmenger, MD, MPH 10/16/2021  I, Ebony Lamb, am acting as scribe for Dr. Nicholas Lose.  I have reviewed the above documentation for accuracy and  completeness, and I agree with the above.

## 2021-10-16 ENCOUNTER — Inpatient Hospital Stay: Payer: No Typology Code available for payment source

## 2021-10-16 ENCOUNTER — Inpatient Hospital Stay (HOSPITAL_BASED_OUTPATIENT_CLINIC_OR_DEPARTMENT_OTHER): Payer: No Typology Code available for payment source | Admitting: Hematology and Oncology

## 2021-10-16 ENCOUNTER — Other Ambulatory Visit: Payer: Self-pay

## 2021-10-16 DIAGNOSIS — Z95828 Presence of other vascular implants and grafts: Secondary | ICD-10-CM

## 2021-10-16 DIAGNOSIS — C50411 Malignant neoplasm of upper-outer quadrant of right female breast: Secondary | ICD-10-CM | POA: Diagnosis not present

## 2021-10-16 DIAGNOSIS — Z170 Estrogen receptor positive status [ER+]: Secondary | ICD-10-CM | POA: Diagnosis not present

## 2021-10-16 LAB — CMP (CANCER CENTER ONLY)
ALT: 28 U/L (ref 0–44)
AST: 29 U/L (ref 15–41)
Albumin: 4.3 g/dL (ref 3.5–5.0)
Alkaline Phosphatase: 73 U/L (ref 38–126)
Anion gap: 6 (ref 5–15)
BUN: 9 mg/dL (ref 6–20)
CO2: 26 mmol/L (ref 22–32)
Calcium: 9.5 mg/dL (ref 8.9–10.3)
Chloride: 108 mmol/L (ref 98–111)
Creatinine: 0.63 mg/dL (ref 0.44–1.00)
GFR, Estimated: >60 mL/min (ref >60)
Glucose, Bld: 111 mg/dL — ABNORMAL HIGH (ref 70–99)
Potassium: 3.7 mmol/L (ref 3.5–5.1)
Sodium: 140 mmol/L (ref 135–145)
Total Bilirubin: 0.4 mg/dL (ref 0.3–1.2)
Total Protein: 6.7 g/dL (ref 6.5–8.1)

## 2021-10-16 LAB — CBC WITH DIFFERENTIAL (CANCER CENTER ONLY)
Abs Immature Granulocytes: 0.06 10*3/uL (ref 0.00–0.07)
Basophils Absolute: 0 10*3/uL (ref 0.0–0.1)
Basophils Relative: 1 %
Eosinophils Absolute: 0.1 10*3/uL (ref 0.0–0.5)
Eosinophils Relative: 1 %
HCT: 31.6 % — ABNORMAL LOW (ref 36.0–46.0)
Hemoglobin: 11.1 g/dL — ABNORMAL LOW (ref 12.0–15.0)
Immature Granulocytes: 1 %
Lymphocytes Relative: 16 %
Lymphs Abs: 1.1 10*3/uL (ref 0.7–4.0)
MCH: 34.3 pg — ABNORMAL HIGH (ref 26.0–34.0)
MCHC: 35.1 g/dL (ref 30.0–36.0)
MCV: 97.5 fL (ref 80.0–100.0)
Monocytes Absolute: 0.3 10*3/uL (ref 0.1–1.0)
Monocytes Relative: 5 %
Neutro Abs: 5.3 10*3/uL (ref 1.7–7.7)
Neutrophils Relative %: 76 %
Platelet Count: 193 10*3/uL (ref 150–400)
RBC: 3.24 MIL/uL — ABNORMAL LOW (ref 3.87–5.11)
RDW: 13.1 % (ref 11.5–15.5)
WBC Count: 6.8 10*3/uL (ref 4.0–10.5)
nRBC: 0 % (ref 0.0–0.2)

## 2021-10-16 LAB — PREGNANCY, URINE: Preg Test, Ur: NEGATIVE

## 2021-10-16 MED ORDER — FAMOTIDINE IN NACL 20-0.9 MG/50ML-% IV SOLN
20.0000 mg | Freq: Once | INTRAVENOUS | Status: AC
  Administered 2021-10-16: 20 mg via INTRAVENOUS
  Filled 2021-10-16: qty 50

## 2021-10-16 MED ORDER — DIPHENHYDRAMINE HCL 50 MG/ML IJ SOLN
25.0000 mg | Freq: Once | INTRAMUSCULAR | Status: AC
  Administered 2021-10-16: 25 mg via INTRAVENOUS
  Filled 2021-10-16: qty 1

## 2021-10-16 MED ORDER — SODIUM CHLORIDE 0.9 % IV SOLN
10.0000 mg | Freq: Once | INTRAVENOUS | Status: AC
  Administered 2021-10-16: 10 mg via INTRAVENOUS
  Filled 2021-10-16: qty 10

## 2021-10-16 MED ORDER — SODIUM CHLORIDE 0.9% FLUSH
10.0000 mL | INTRAVENOUS | Status: DC | PRN
  Administered 2021-10-16: 10 mL

## 2021-10-16 MED ORDER — SODIUM CHLORIDE 0.9 % IV SOLN
45.0000 mg/m2 | Freq: Once | INTRAVENOUS | Status: AC
  Administered 2021-10-16: 72 mg via INTRAVENOUS
  Filled 2021-10-16: qty 12

## 2021-10-16 MED ORDER — HEPARIN SOD (PORK) LOCK FLUSH 100 UNIT/ML IV SOLN
500.0000 [IU] | Freq: Once | INTRAVENOUS | Status: AC | PRN
  Administered 2021-10-16: 500 [IU]

## 2021-10-16 MED ORDER — SODIUM CHLORIDE 0.9% FLUSH
10.0000 mL | Freq: Once | INTRAVENOUS | Status: AC
  Administered 2021-10-16: 10 mL

## 2021-10-16 MED ORDER — SODIUM CHLORIDE 0.9 % IV SOLN
105.3000 mg | Freq: Once | INTRAVENOUS | Status: AC
  Administered 2021-10-16: 110 mg via INTRAVENOUS
  Filled 2021-10-16: qty 11

## 2021-10-16 MED ORDER — SODIUM CHLORIDE 0.9 % IV SOLN: Freq: Once | INTRAVENOUS | Status: AC

## 2021-10-16 NOTE — Patient Instructions (Signed)
[facility]COL[facility]   Discharge Instructions: Thank you for choosing [facility] to provide your oncology and hematology care.   If you have a lab appointment with the Cancer Center, please go directly to the Cancer Center and check in at the registration area.   Wear comfortable clothing and clothing appropriate for easy access to any Portacath or PICC line.   We strive to give you quality time with your provider. You may need to reschedule your appointment if you arrive late (15 or more minutes).  Arriving late affects you and other patients whose appointments are after yours.  Also, if you miss three or more appointments without notifying the office, you may be dismissed from the clinic at the provider's discretion.      For prescription refill requests, have your pharmacy contact our office and allow 72 hours for refills to be completed.    Today you received the following chemotherapy and/or immunotherapy agents: paclitaxel and carboplatin.      To help prevent nausea and vomiting after your treatment, we encourage you to take your nausea medication as directed.  BELOW ARE SYMPTOMS THAT SHOULD BE REPORTED IMMEDIATELY: *FEVER GREATER THAN 100.4 F (38 C) OR HIGHER *CHILLS OR SWEATING *NAUSEA AND VOMITING THAT IS NOT CONTROLLED WITH YOUR NAUSEA MEDICATION *UNUSUAL SHORTNESS OF BREATH *UNUSUAL BRUISING OR BLEEDING *URINARY PROBLEMS (pain or burning when urinating, or frequent urination) *BOWEL PROBLEMS (unusual diarrhea, constipation, pain near the anus) TENDERNESS IN MOUTH AND THROAT WITH OR WITHOUT PRESENCE OF ULCERS (sore throat, sores in mouth, or a toothache) UNUSUAL RASH, SWELLING OR PAIN  UNUSUAL VAGINAL DISCHARGE OR ITCHING   Items with * indicate a potential emergency and should be followed up as soon as possible or go to the Emergency Department if any problems should occur.  Please show the CHEMOTHERAPY ALERT CARD or IMMUNOTHERAPY ALERT  CARD at check-in to the Emergency Department and triage nurse.  Should you have questions after your visit or need to cancel or reschedule your appointment, please contact [facility]  Dept: [phone]  and follow the prompts.  Office hours are 8:00 a.m. to 4:30 p.m. Monday - Friday. Please note that voicemails left after 4:00 p.m. may not be returned until the following business day.  We are closed weekends and major holidays. You have access to a nurse at all times for urgent questions. Please call the main number to the clinic Dept: [phone] and follow the prompts.   For any non-urgent questions, you may also contact your provider using MyChart. We now offer e-Visits for anyone 18 and older to request care online for non-urgent symptoms. For details visit [email or web address].   Also download the MyChart app! Go to the app store, search "MyChart", open the app, select [facility], and log in with your MyChart username and password.  Due to Covid, a mask is required upon entering the hospital/clinic. If you do not have a mask, one will be given to you upon arrival. For doctor visits, patients may have 1 support person aged 18 or older with them. For treatment visits, patients cannot have anyone with them due to current Covid guidelines and our immunocompromised population.   

## 2021-10-19 ENCOUNTER — Encounter: Payer: Self-pay | Admitting: *Deleted

## 2021-10-21 ENCOUNTER — Other Ambulatory Visit: Payer: Self-pay

## 2021-10-21 ENCOUNTER — Inpatient Hospital Stay: Payer: No Typology Code available for payment source

## 2021-10-21 VITALS — BP 113/82 | HR 72 | Temp 98.4°F | Resp 16

## 2021-10-21 DIAGNOSIS — C50411 Malignant neoplasm of upper-outer quadrant of right female breast: Secondary | ICD-10-CM | POA: Diagnosis not present

## 2021-10-21 DIAGNOSIS — Z95828 Presence of other vascular implants and grafts: Secondary | ICD-10-CM

## 2021-10-21 MED ORDER — FILGRASTIM-AAFI 300 MCG/0.5ML IJ SOSY
300.0000 ug | PREFILLED_SYRINGE | Freq: Once | INTRAMUSCULAR | Status: AC
  Administered 2021-10-21: 300 ug via SUBCUTANEOUS
  Filled 2021-10-21: qty 0.5

## 2021-10-21 NOTE — Patient Instructions (Signed)
Filgrastim, G-CSF injection °What is this medication? °FILGRASTIM, G-CSF (fil GRA stim) is a granulocyte colony-stimulating factor that stimulates the growth of neutrophils, a type of white blood cell (WBC) important in the body's fight against infection. It is used to reduce the incidence of fever and infection in patients with certain types of cancer who are receiving chemotherapy that affects the bone marrow, to stimulate blood cell production for removal of WBCs from the body prior to a bone marrow transplantation, to reduce the incidence of fever and infection in patients who have severe chronic neutropenia, and to improve survival outcomes following high-dose radiation exposure that is toxic to the bone marrow. °This medicine may be used for other purposes; ask your health care provider or pharmacist if you have questions. °COMMON BRAND NAME(S): Neupogen, Nivestym, Releuko, Zarxio °What should I tell my care team before I take this medication? °They need to know if you have any of these conditions: °kidney disease °latex allergy °ongoing radiation therapy °sickle cell disease °an unusual or allergic reaction to filgrastim, pegfilgrastim, other medicines, foods, dyes, or preservatives °pregnant or trying to get pregnant °breast-feeding °How should I use this medication? °This medicine is for injection under the skin or infusion into a vein. As an infusion into a vein, it is usually given by a health care professional in a hospital or clinic setting. If you get this medicine at home, you will be taught how to prepare and give this medicine. Refer to the Instructions for Use that come with your medication packaging. Use exactly as directed. Take your medicine at regular intervals. Do not take your medicine more often than directed. °It is important that you put your used needles and syringes in a special sharps container. Do not put them in a trash can. If you do not have a sharps container, call your pharmacist  or healthcare provider to get one. °Talk to your pediatrician regarding the use of this medicine in children. While this drug may be prescribed for children as young as 7 months for selected conditions, precautions do apply. °Overdosage: If you think you have taken too much of this medicine contact a poison control center or emergency room at once. °NOTE: This medicine is only for you. Do not share this medicine with others. °What if I miss a dose? °It is important not to miss your dose. Call your doctor or health care professional if you miss a dose. °What may interact with this medication? °This medicine may interact with the following medications: °medicines that may cause a release of neutrophils, such as lithium °This list may not describe all possible interactions. Give your health care provider a list of all the medicines, herbs, non-prescription drugs, or dietary supplements you use. Also tell them if you smoke, drink alcohol, or use illegal drugs. Some items may interact with your medicine. °What should I watch for while using this medication? °Your condition will be monitored carefully while you are receiving this medicine. °You may need blood work done while you are taking this medicine. °Talk to your health care provider about your risk of cancer. You may be more at risk for certain types of cancer if you take this medicine. °What side effects may I notice from receiving this medication? °Side effects that you should report to your doctor or health care professional as soon as possible: °allergic reactions like skin rash, itching or hives, swelling of the face, lips, or tongue °back pain °dizziness or feeling faint °fever °pain, redness, or   irritation at site where injected °pinpoint red spots on the skin °shortness of breath or breathing problems °signs and symptoms of kidney injury like trouble passing urine, change in the amount of urine, or red or dark-brown urine °stomach or side pain, or pain at  the shoulder °swelling °tiredness °unusual bleeding or bruising °Side effects that usually do not require medical attention (report to your doctor or health care professional if they continue or are bothersome): °bone pain °cough °diarrhea °hair loss °headache °muscle pain °This list may not describe all possible side effects. Call your doctor for medical advice about side effects. You may report side effects to FDA at 1-800-FDA-1088. °Where should I keep my medication? °Keep out of the reach of children. °Store in a refrigerator between 2 and 8 degrees C (36 and 46 degrees F). Do not freeze. Keep in carton to protect from light. Throw away this medicine if vials or syringes are left out of the refrigerator for more than 24 hours. Throw away any unused medicine after the expiration date. °NOTE: This sheet is a summary. It may not cover all possible information. If you have questions about this medicine, talk to your doctor, pharmacist, or health care provider. °© 2022 [employer]vier/Gold Standard (2021-05-19 00:00:00) ° °

## 2021-10-22 ENCOUNTER — Other Ambulatory Visit: Payer: No Typology Code available for payment source

## 2021-10-22 ENCOUNTER — Ambulatory Visit: Payer: No Typology Code available for payment source

## 2021-10-22 ENCOUNTER — Ambulatory Visit: Payer: No Typology Code available for payment source | Admitting: Hematology and Oncology

## 2021-10-22 ENCOUNTER — Other Ambulatory Visit: Payer: Self-pay | Admitting: *Deleted

## 2021-10-22 DIAGNOSIS — C50411 Malignant neoplasm of upper-outer quadrant of right female breast: Secondary | ICD-10-CM

## 2021-10-22 DIAGNOSIS — Z170 Estrogen receptor positive status [ER+]: Secondary | ICD-10-CM

## 2021-10-22 MED FILL — Dexamethasone Sodium Phosphate Inj 100 MG/10ML: INTRAMUSCULAR | Qty: 1 | Status: AC

## 2021-10-22 NOTE — Assessment & Plan Note (Signed)
right breast upper outer quadrant biopsy 06/04/2021 for a clinical mT2 N0 invasive ductal carcinoma, grade 3, estrogen and progesterone receptor weakly positive, HER2 not amplified, with an MIB-1 of 70% °CT CAP and Bone Scan: Neg °  °Treatment Plan: °1. Neo adj chemo with AC X 4 followed by Taxol and Carboplatin weekly X 12 °2. Breast Conserving surgery with SLN surgery °3. Adjuvant XRT °4. Adj Anti estrogen therapy °------------------------------------------------------------------------------------------------------------------- °Current Treatment: Completed 4 cycles of AC, today is cycle 10 Taxol with Carboplatin °Chemo Toxicities: °1. Fatigue °2. Chemo induced anemia: Hemoglobin was 10.2 on 08/24/2021 °3. Leukopenia: ANC 1: Okay to treat today.  Taxol and Carbo has been dose reduced again today.  Adding Granix to her treatment regimen every Wednesday. °4. Fevers following chemotherapy: She was given a prescription for antibiotics if her fever comes back..   °  °I set her up for a breast MRI to be done 11/09/2021. °I will request Dr. Wakefield to see her after the MRI. °She plans to do a mastectomy and expanders in [place] and then undergo a cutaneous flap based reconstruction at [facility].  However she is not 100% certain on that because if she does not have to go through radiation then she may not need the flap reconstruction. °

## 2021-10-22 NOTE — Progress Notes (Signed)
Patient Care Team: Antony Contras, MD as PCP - General (Family Medicine) Eppie Gibson, MD as Attending Physician (Radiation Oncology) Rockwell Germany, RN as Oncology Nurse Navigator Mauro Kaufmann, RN as Oncology Nurse Navigator Sanjuana Kava, MD as Referring Physician (Obstetrics and Gynecology) [name], MD as Consulting Physician (Plastic Surgery) Nicholas Lose, MD as Consulting Physician (Hematology and Oncology) Rolm Bookbinder, MD as Consulting Physician (General Surgery)  DIAGNOSIS:    ICD-10-CM   1. Dizziness  R42 MR Brain W Wo Contrast    2. Malignant neoplasm of upper-outer quadrant of right breast in female, estrogen receptor positive ([place])  C50.411 MR Brain W Wo Contrast   Z17.0       SUMMARY OF ONCOLOGIC HISTORY: Oncology History  Malignant neoplasm of upper-outer quadrant of right breast in female, estrogen receptor positive ([place])  06/04/2021 Initial Diagnosis   status post right breast upper outer quadrant biopsy 06/04/2021 for a clinical mT2 N0 invasive ductal carcinoma, grade 3, estrogen and progesterone receptor inadequate (functionally triple negative with HER2 not amplified), with an MIB-1 of 70%             (A) chest CT with contrast 06/22/2021 shows right breast mass, no other lesions of concern; pulmonary nodules all less than 5 mm noted, likely benign             (B) bone scan 06/22/2021 negative for metastases; consistent with sacroiliitis   06/10/2021 Cancer Staging   Staging form: Breast, AJCC 8th Edition - Clinical stage from 06/10/2021: Stage IIA (cT2, cN0, cM0, G3, ER+, PR+, HER2-) - Signed by Chauncey Cruel, MD on 06/10/2021 Stage prefix: Initial diagnosis Histologic grading system: 3 grade system Laterality: Right Staged by: Pathologist and managing physician Stage used in treatment planning: Yes National guidelines used in treatment planning: Yes Type of national guideline used in treatment planning: NCCN    06/16/2021 Genetic  Testing   Ambry CancerNext-Expanded was negative. No pathogenic variants were identified. 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 Report date is 06/19/2021.  The CancerNext-Expanded gene panel offered by [facility] and includes sequencing, rearrangement, and RNA analysis for the following 77 genes: AIP, ALK, APC, ATM, AXIN2, BAP1, BARD1, BLM, BMPR1A, BRCA1, BRCA2, BRIP1, CDC73, CDH1, CDK4, CDKN1B, CDKN2A, CHEK2, CTNNA1, DICER1, FANCC, FH, FLCN, GALNT12, KIF1B, LZTR1, MAX, MEN1, MET, MLH1, MSH2, MSH3, MSH6, MUTYH, NBN, NF1, NF2, NTHL1, PALB2, PHOX2B, PMS2, POT1, PRKAR1A, PTCH1, PTEN, RAD51C, RAD51D, RB1, RECQL, RET, SDHA, SDHAF2, SDHB, SDHC, SDHD, SMAD4, SMARCA4, SMARCB1, SMARCE1, STK11, SUFU, TMEM127, TP53, TSC1, TSC2, VHL and XRCC2 (sequencing and deletion/duplication); EGFR, EGLN1, HOXB13, KIT, MITF, PDGFRA, POLD1, and POLE (sequencing only); EPCAM and GREM1 (deletion/duplication only).    06/25/2021 -  Neo-Adjuvant Chemotherapy   neoadjuvant chemotherapy consisting of doxorubicin and cyclophosphamide in dose dense fashion x4 started 0/13/2022, completed 08/10/2021, to be followed by weekly carboplatin and paclitaxel x12 starting 08/24/2021     CHIEF COMPLIANT: Cycle 9 Carboplatin and Taxol  INTERVAL HISTORY: Ebony Lamb is a 46 y.o. with above-mentioned history of weakly estrogen receptor positive breast cancer. She is currently on neoadjuvant chemotherapy. She presents to the clinic today for treatment.   ALLERGIES:  is allergic to cefepime.  MEDICATIONS:  Current Outpatient Medications  Medication Sig Dispense Refill   ciprofloxacin (CIPRO) 500 MG tablet Take 1 tablet (500 mg total) by mouth 2 (two) times daily. 14 tablet 0   lidocaine-prilocaine (EMLA) cream Apply to affected area once 30 g 3  loratadine (CLARITIN) 10 MG tablet Take 1 tablet (10 mg total) by mouth daily. Take day post chemo and then daily for 10 days with each treatment 30 tablet  3   LORazepam (ATIVAN) 0.5 MG tablet Take 1 tablet (0.5 mg total) by mouth at bedtime as needed (Nausea or vomiting). 30 tablet 0   metoCLOPramide (REGLAN) 5 MG tablet Take 1 tablet (5 mg total) by mouth every 6 (six) hours as needed for nausea. 90 tablet 1   prochlorperazine (COMPAZINE) 10 MG tablet Take 1 tablet (10 mg total) by mouth every 6 (six) hours as needed (Nausea or vomiting). 30 tablet 1   No current facility-administered medications for this visit.   Facility-Administered Medications Ordered in Other Visits  Medication Dose Route Frequency Provider Last Rate Last Admin   influenza vac split quadrivalent PF (FLUARIX) 0.5 ML injection            influenza vac split quadrivalent PF (FLUARIX) injection 0.5 mL  0.5 mL Intramuscular Once Magrinat, Virgie Dad, MD        PHYSICAL EXAMINATION: ECOG PERFORMANCE STATUS: 1 - Symptomatic but completely ambulatory  Vitals:   10/23/21 1058  BP: 122/72  Pulse: 73  Resp: 18  Temp: (!) 97.3 F (36.3 C)  SpO2: 100%   Filed Weights   10/23/21 1058  Weight: 120 lb 4.8 oz (54.6 kg)    LABORATORY DATA:  I have reviewed the data as listed CMP Latest Ref Rng & Units 10/16/2021 10/09/2021 10/02/2021  Glucose 70 - 99 mg/dL 111(H) 141(H) 107(H)  BUN 6 - 20 mg/dL '9 9 6  ' Creatinine 0.44 - 1.00 mg/dL 0.63 0.60 0.57  Sodium 135 - 145 mmol/L 140 140 141  Potassium 3.5 - 5.1 mmol/L 3.7 3.8 3.7  Chloride 98 - 111 mmol/L 108 108 110  CO2 22 - 32 mmol/L '26 24 25  ' Calcium 8.9 - 10.3 mg/dL 9.5 9.1 9.3  Total Protein 6.5 - 8.1 g/dL 6.7 6.4(L) 6.6  Total Bilirubin 0.3 - 1.2 mg/dL 0.4 0.4 0.6  Alkaline Phos 38 - 126 U/L 73 73 70  AST 15 - 41 U/L 29 33 34  ALT 0 - 44 U/L 28 34 34    Lab Results  Component Value Date   WBC 7.3 10/23/2021   HGB 10.8 (L) 10/23/2021   HCT 31.6 (L) 10/23/2021   MCV 99.4 10/23/2021   PLT 193 10/23/2021   NEUTROABS 5.7 10/23/2021    ASSESSMENT & PLAN:  Malignant neoplasm of upper-outer quadrant of right breast in  female, estrogen receptor positive ([place]) right breast upper outer quadrant biopsy 06/04/2021 for a clinical mT2 N0 invasive ductal carcinoma, grade 3, estrogen and progesterone receptor weakly positive, HER2 not amplified, with an MIB-1 of 70% CT CAP and Bone Scan: Neg   Treatment Plan: 1. Neo adj chemo with AC X 4 followed by Taxol and Carboplatin weekly X 12 2. Breast Conserving surgery with SLN surgery 3. Adjuvant XRT 4. Adj Anti estrogen therapy ------------------------------------------------------------------------------------------------------------------- Current Treatment: Completed 4 cycles of AC, today is cycle 10 Taxol with Carboplatin Chemo Toxicities: Fatigue Chemo induced anemia: Hemoglobin was 10.8 Leukopenia: Significant improvement with the Granix injection.  He would like to skip next weeks of Granix injection and see if she can handle the rest of the treatments. Fevers following chemotherapy: She was given a prescription for antibiotics if her fever comes back..     I set her up for a breast MRI to be done 11/09/2021. I will request Dr.  Wakefield to see her after the MRI. She plans to do a maste[place] and expanders in [place] and then undergo a cutaneous flap based reconstruction at [facility].    Her surgery date has been set up for 12/07/2021.  I will make an appointment for the week after for her to come and see me.   Orders Placed This Encounter  Procedures   MR Brain W Wo Contrast    Standing Status:   Future    Standing Expiration Date:   10/23/2022    Order Specific Question:   If indicated for the ordered procedure, I authorize the administration of contrast media per Radiology protocol    Answer:   Yes    Order Specific Question:   What is the patient's sedation requirement?    Answer:   No Sedation    Order Specific Question:   Does the patient have a pacemaker or implanted devices?    Answer:   No    Order Specific Question:   Use SRS Protocol?    Answer:    No    Order Specific Question:   Preferred imaging location?    Answer:   [facility] (table limit-550lbs)    Order Specific Question:   Release to patient    Answer:   Immediate   The patient has a good understanding of the overall plan. she agrees with it. she will call with any problems that may develop before the next visit here.  Total time spent: 30 mins including face to face time and time spent for planning, charting and coordination of care  Rulon Eisenmenger, MD, MPH 10/23/2021  I, Thana Ates, am acting as scribe for Dr. Nicholas Lose.  I have reviewed the above documentation for accuracy and completeness, and I agree with the above.

## 2021-10-23 ENCOUNTER — Inpatient Hospital Stay (HOSPITAL_BASED_OUTPATIENT_CLINIC_OR_DEPARTMENT_OTHER): Payer: No Typology Code available for payment source | Admitting: Hematology and Oncology

## 2021-10-23 ENCOUNTER — Inpatient Hospital Stay: Payer: No Typology Code available for payment source

## 2021-10-23 ENCOUNTER — Other Ambulatory Visit: Payer: Self-pay

## 2021-10-23 VITALS — BP 122/72 | HR 73 | Temp 97.3°F | Resp 18 | Ht 66.0 in | Wt 120.3 lb

## 2021-10-23 DIAGNOSIS — C50411 Malignant neoplasm of upper-outer quadrant of right female breast: Secondary | ICD-10-CM | POA: Diagnosis not present

## 2021-10-23 DIAGNOSIS — Z170 Estrogen receptor positive status [ER+]: Secondary | ICD-10-CM

## 2021-10-23 DIAGNOSIS — R42 Dizziness and giddiness: Secondary | ICD-10-CM | POA: Diagnosis not present

## 2021-10-23 DIAGNOSIS — Z95828 Presence of other vascular implants and grafts: Secondary | ICD-10-CM

## 2021-10-23 LAB — CBC WITH DIFFERENTIAL (CANCER CENTER ONLY)
Abs Immature Granulocytes: 0.03 10*3/uL (ref 0.00–0.07)
Basophils Absolute: 0 10*3/uL (ref 0.0–0.1)
Basophils Relative: 0 %
Eosinophils Absolute: 0 10*3/uL (ref 0.0–0.5)
Eosinophils Relative: 0 %
HCT: 31.6 % — ABNORMAL LOW (ref 36.0–46.0)
Hemoglobin: 10.8 g/dL — ABNORMAL LOW (ref 12.0–15.0)
Immature Granulocytes: 0 %
Lymphocytes Relative: 16 %
Lymphs Abs: 1.2 10*3/uL (ref 0.7–4.0)
MCH: 34 pg (ref 26.0–34.0)
MCHC: 34.2 g/dL (ref 30.0–36.0)
MCV: 99.4 fL (ref 80.0–100.0)
Monocytes Absolute: 0.3 10*3/uL (ref 0.1–1.0)
Monocytes Relative: 5 %
Neutro Abs: 5.7 10*3/uL (ref 1.7–7.7)
Neutrophils Relative %: 79 %
Platelet Count: 193 10*3/uL (ref 150–400)
RBC: 3.18 MIL/uL — ABNORMAL LOW (ref 3.87–5.11)
RDW: 13 % (ref 11.5–15.5)
WBC Count: 7.3 10*3/uL (ref 4.0–10.5)
nRBC: 0 % (ref 0.0–0.2)

## 2021-10-23 LAB — CMP (CANCER CENTER ONLY)
ALT: 19 U/L (ref 0–44)
AST: 19 U/L (ref 15–41)
Albumin: 4.2 g/dL (ref 3.5–5.0)
Alkaline Phosphatase: 69 U/L (ref 38–126)
Anion gap: 5 (ref 5–15)
BUN: 8 mg/dL (ref 6–20)
CO2: 26 mmol/L (ref 22–32)
Calcium: 9 mg/dL (ref 8.9–10.3)
Chloride: 108 mmol/L (ref 98–111)
Creatinine: 0.55 mg/dL (ref 0.44–1.00)
GFR, Estimated: >60 mL/min (ref >60)
Glucose, Bld: 105 mg/dL — ABNORMAL HIGH (ref 70–99)
Potassium: 3.8 mmol/L (ref 3.5–5.1)
Sodium: 139 mmol/L (ref 135–145)
Total Bilirubin: 0.3 mg/dL (ref 0.3–1.2)
Total Protein: 6.4 g/dL — ABNORMAL LOW (ref 6.5–8.1)

## 2021-10-23 LAB — PREGNANCY, URINE: Preg Test, Ur: NEGATIVE

## 2021-10-23 MED ORDER — DIPHENHYDRAMINE HCL 50 MG/ML IJ SOLN
25.0000 mg | Freq: Once | INTRAMUSCULAR | Status: AC
  Administered 2021-10-23: 25 mg via INTRAVENOUS
  Filled 2021-10-23: qty 1

## 2021-10-23 MED ORDER — SODIUM CHLORIDE 0.9 % IV SOLN
10.0000 mg | Freq: Once | INTRAVENOUS | Status: AC
  Administered 2021-10-23: 10 mg via INTRAVENOUS
  Filled 2021-10-23: qty 10

## 2021-10-23 MED ORDER — HEPARIN SOD (PORK) LOCK FLUSH 100 UNIT/ML IV SOLN
500.0000 [IU] | Freq: Once | INTRAVENOUS | Status: AC | PRN
  Administered 2021-10-23: 500 [IU]

## 2021-10-23 MED ORDER — SODIUM CHLORIDE 0.9% FLUSH
10.0000 mL | Freq: Once | INTRAVENOUS | Status: AC
  Administered 2021-10-23: 10 mL

## 2021-10-23 MED ORDER — SODIUM CHLORIDE 0.9 % IV SOLN: Freq: Once | INTRAVENOUS | Status: AC

## 2021-10-23 MED ORDER — SODIUM CHLORIDE 0.9% FLUSH
10.0000 mL | INTRAVENOUS | Status: DC | PRN
  Administered 2021-10-23: 10 mL

## 2021-10-23 MED ORDER — SODIUM CHLORIDE 0.9 % IV SOLN
45.0000 mg/m2 | Freq: Once | INTRAVENOUS | Status: AC
  Administered 2021-10-23: 72 mg via INTRAVENOUS
  Filled 2021-10-23: qty 12

## 2021-10-23 MED ORDER — FAMOTIDINE IN NACL 20-0.9 MG/50ML-% IV SOLN
20.0000 mg | Freq: Once | INTRAVENOUS | Status: AC
  Administered 2021-10-23: 20 mg via INTRAVENOUS
  Filled 2021-10-23: qty 50

## 2021-10-23 MED ORDER — SODIUM CHLORIDE 0.9 % IV SOLN
105.3000 mg | Freq: Once | INTRAVENOUS | Status: AC
  Administered 2021-10-23: 110 mg via INTRAVENOUS
  Filled 2021-10-23: qty 11

## 2021-10-23 NOTE — Patient Instructions (Signed)
[facility] MEDICAL [facility]COLOG[facility]   Discharge Instructions: Thank you for choosing [facility] to provide your oncology and hematology care.   If you have a lab appointment with the Cancer Center, please go directly to the Cancer Center and check in at the registration area.   Wear comfortable clothing and clothing appropriate for easy access to any Portacath or PICC line.   We strive to give you quality time with your provider. You may need to reschedule your appointment if you arrive late (15 or more minutes).  Arriving late affects you and other patients whose appointments are after yours.  Also, if you miss three or more appointments without notifying the office, you may be dismissed from the clinic at the provider's discretion.      For prescription refill requests, have your pharmacy contact our office and allow 72 hours for refills to be completed.    Today you received the following chemotherapy and/or immunotherapy agents: paclitaxel and carboplatin.      To help prevent nausea and vomiting after your treatment, we encourage you to take your nausea medication as directed.  BELOW ARE SYMPTOMS THAT SHOULD BE REPORTED IMMEDIATELY: *FEVER GREATER THAN 100.4 F (38 C) OR HIGHER *CHILLS OR SWEATING *NAUSEA AND VOMITING THAT IS NOT CONTROLLED WITH YOUR NAUSEA MEDICATION *UNUSUAL SHORTNESS OF BREATH *UNUSUAL BRUISING OR BLEEDING *URINARY PROBLEMS (pain or burning when urinating, or frequent urination) *BOWEL PROBLEMS (unusual diarrhea, constipation, pain near the anus) TENDERNESS IN MOUTH AND THROAT WITH OR WITHOUT PRESENCE OF ULCERS (sore throat, sores in mouth, or a toothache) UNUSUAL RASH, SWELLING OR PAIN  UNUSUAL VAGINAL DISCHARGE OR ITCHING   Items with * indicate a potential emergency and should be followed up as soon as possible or go to the Emergency Department if any problems should occur.  Please show the CHEMOTHERAPY ALERT CARD or IMMUNOTHERAPY ALERT  CARD at check-in to the Emergency Department and triage nurse.  Should you have questions after your visit or need to cancel or reschedule your appointment, please contact [facility]  Dept: [phone]  and follow the prompts.  Office hours are 8:00 a.m. to 4:30 p.m. Monday - Friday. Please note that voicemails left after 4:00 p.m. may not be returned until the following business day.  We are closed weekends and major holidays. You have access to a nurse at all times for urgent questions. Please call the main number to the clinic Dept: [phone] and follow the prompts.   For any non-urgent questions, you may also contact your provider using MyChart. We now offer e-Visits for anyone 18 and older to request care online for non-urgent symptoms. For details visit [email or web address].   Also download the MyChart app! Go to the app store, search "MyChart", open the app, select [place], and log in with your MyChart username and password.  Due to Covid, a mask is required upon entering the hospital/clinic. If you do not have a mask, one will be given to you upon arrival. For doctor visits, patients may have 1 support person aged 18 or older with them. For treatment visits, patients cannot have anyone with them due to current Covid guidelines and our immunocompromised population.   

## 2021-10-26 ENCOUNTER — Telehealth: Payer: Self-pay | Admitting: Hematology and Oncology

## 2021-10-26 NOTE — Telephone Encounter (Signed)
Scheduled appointment per 2/10 los. Patient is aware. Patient will be mailed an updated calendar.

## 2021-10-28 ENCOUNTER — Inpatient Hospital Stay: Payer: No Typology Code available for payment source

## 2021-10-29 ENCOUNTER — Other Ambulatory Visit: Payer: No Typology Code available for payment source

## 2021-10-29 ENCOUNTER — Ambulatory Visit: Payer: No Typology Code available for payment source

## 2021-10-29 MED FILL — Dexamethasone Sodium Phosphate Inj 100 MG/10ML: INTRAMUSCULAR | Qty: 1 | Status: AC

## 2021-10-30 ENCOUNTER — Inpatient Hospital Stay: Payer: No Typology Code available for payment source

## 2021-10-30 ENCOUNTER — Other Ambulatory Visit: Payer: Self-pay

## 2021-10-30 VITALS — BP 114/87 | HR 89 | Temp 98.3°F | Resp 18 | Wt 119.0 lb

## 2021-10-30 DIAGNOSIS — Z170 Estrogen receptor positive status [ER+]: Secondary | ICD-10-CM

## 2021-10-30 DIAGNOSIS — C50411 Malignant neoplasm of upper-outer quadrant of right female breast: Secondary | ICD-10-CM

## 2021-10-30 DIAGNOSIS — Z95828 Presence of other vascular implants and grafts: Secondary | ICD-10-CM

## 2021-10-30 LAB — CBC WITH DIFFERENTIAL (CANCER CENTER ONLY)
Abs Immature Granulocytes: 0 10*3/uL (ref 0.00–0.07)
Basophils Absolute: 0 10*3/uL (ref 0.0–0.1)
Basophils Relative: 1 %
Eosinophils Absolute: 0 10*3/uL (ref 0.0–0.5)
Eosinophils Relative: 0 %
HCT: 34 % — ABNORMAL LOW (ref 36.0–46.0)
Hemoglobin: 11.8 g/dL — ABNORMAL LOW (ref 12.0–15.0)
Immature Granulocytes: 0 %
Lymphocytes Relative: 45 %
Lymphs Abs: 1.1 10*3/uL (ref 0.7–4.0)
MCH: 34.1 pg — ABNORMAL HIGH (ref 26.0–34.0)
MCHC: 34.7 g/dL (ref 30.0–36.0)
MCV: 98.3 fL (ref 80.0–100.0)
Monocytes Absolute: 0.2 10*3/uL (ref 0.1–1.0)
Monocytes Relative: 8 %
Neutro Abs: 1.1 10*3/uL — ABNORMAL LOW (ref 1.7–7.7)
Neutrophils Relative %: 46 %
Platelet Count: 195 10*3/uL (ref 150–400)
RBC: 3.46 MIL/uL — ABNORMAL LOW (ref 3.87–5.11)
RDW: 12.9 % (ref 11.5–15.5)
WBC Count: 2.4 10*3/uL — ABNORMAL LOW (ref 4.0–10.5)
nRBC: 0 % (ref 0.0–0.2)

## 2021-10-30 LAB — CMP (CANCER CENTER ONLY)
ALT: 27 U/L (ref 0–44)
AST: 25 U/L (ref 15–41)
Albumin: 4.3 g/dL (ref 3.5–5.0)
Alkaline Phosphatase: 65 U/L (ref 38–126)
Anion gap: 5 (ref 5–15)
BUN: 11 mg/dL (ref 6–20)
CO2: 27 mmol/L (ref 22–32)
Calcium: 9.2 mg/dL (ref 8.9–10.3)
Chloride: 107 mmol/L (ref 98–111)
Creatinine: 0.57 mg/dL (ref 0.44–1.00)
GFR, Estimated: >60 mL/min (ref >60)
Glucose, Bld: 102 mg/dL — ABNORMAL HIGH (ref 70–99)
Potassium: 3.8 mmol/L (ref 3.5–5.1)
Sodium: 139 mmol/L (ref 135–145)
Total Bilirubin: 0.4 mg/dL (ref 0.3–1.2)
Total Protein: 6.7 g/dL (ref 6.5–8.1)

## 2021-10-30 LAB — PREGNANCY, URINE: Preg Test, Ur: NEGATIVE

## 2021-10-30 MED ORDER — FAMOTIDINE IN NACL 20-0.9 MG/50ML-% IV SOLN
20.0000 mg | Freq: Once | INTRAVENOUS | Status: AC
  Administered 2021-10-30: 20 mg via INTRAVENOUS
  Filled 2021-10-30: qty 50

## 2021-10-30 MED ORDER — SODIUM CHLORIDE 0.9 % IV SOLN
105.3000 mg | Freq: Once | INTRAVENOUS | Status: AC
  Administered 2021-10-30: 110 mg via INTRAVENOUS
  Filled 2021-10-30: qty 11

## 2021-10-30 MED ORDER — SODIUM CHLORIDE 0.9% FLUSH
10.0000 mL | Freq: Once | INTRAVENOUS | Status: AC
  Administered 2021-10-30: 10 mL

## 2021-10-30 MED ORDER — DIPHENHYDRAMINE HCL 50 MG/ML IJ SOLN
25.0000 mg | Freq: Once | INTRAMUSCULAR | Status: AC
  Administered 2021-10-30: 25 mg via INTRAVENOUS
  Filled 2021-10-30: qty 1

## 2021-10-30 MED ORDER — SODIUM CHLORIDE 0.9 % IV SOLN: Freq: Once | INTRAVENOUS | Status: AC

## 2021-10-30 MED ORDER — HEPARIN SOD (PORK) LOCK FLUSH 100 UNIT/ML IV SOLN
500.0000 [IU] | Freq: Once | INTRAVENOUS | Status: AC | PRN
  Administered 2021-10-30: 500 [IU]

## 2021-10-30 MED ORDER — SODIUM CHLORIDE 0.9 % IV SOLN
45.0000 mg/m2 | Freq: Once | INTRAVENOUS | Status: AC
  Administered 2021-10-30: 72 mg via INTRAVENOUS
  Filled 2021-10-30: qty 12

## 2021-10-30 MED ORDER — SODIUM CHLORIDE 0.9 % IV SOLN
10.0000 mg | Freq: Once | INTRAVENOUS | Status: AC
  Administered 2021-10-30: 10 mg via INTRAVENOUS
  Filled 2021-10-30: qty 10

## 2021-10-30 MED ORDER — SODIUM CHLORIDE 0.9% FLUSH
10.0000 mL | INTRAVENOUS | Status: DC | PRN
  Administered 2021-10-30: 10 mL

## 2021-10-30 NOTE — Patient Instructions (Signed)
[facility]COL[facility]  Discharge Instructions: Thank you for choosing [facility] to provide your oncology and hematology care.   If you have a lab appointment with the Cancer Center, please go directly to the Cancer Center and check in at the registration area.   Wear comfortable clothing and clothing appropriate for easy access to any Portacath or PICC line.   We strive to give you quality time with your provider. You may need to reschedule your appointment if you arrive late (15 or more minutes).  Arriving late affects you and other patients whose appointments are after yours.  Also, if you miss three or more appointments without notifying the office, you may be dismissed from the clinic at the provider's discretion.      For prescription refill requests, have your pharmacy contact our office and allow 72 hours for refills to be completed.    Today you received the following chemotherapy and/or immunotherapy agents: Paclitaxel (Taxol) and Carboplatin.   To help prevent nausea and vomiting after your treatment, we encourage you to take your nausea medication as directed.  BELOW ARE SYMPTOMS THAT SHOULD BE REPORTED IMMEDIATELY: *FEVER GREATER THAN 100.4 F (38 C) OR HIGHER *CHILLS OR SWEATING *NAUSEA AND VOMITING THAT IS NOT CONTROLLED WITH YOUR NAUSEA MEDICATION *UNUSUAL SHORTNESS OF BREATH *UNUSUAL BRUISING OR BLEEDING *URINARY PROBLEMS (pain or burning when urinating, or frequent urination) *BOWEL PROBLEMS (unusual diarrhea, constipation, pain near the anus) TENDERNESS IN MOUTH AND THROAT WITH OR WITHOUT PRESENCE OF ULCERS (sore throat, sores in mouth, or a toothache) UNUSUAL RASH, SWELLING OR PAIN  UNUSUAL VAGINAL DISCHARGE OR ITCHING   Items with * indicate a potential emergency and should be followed up as soon as possible or go to the Emergency Department if any problems should occur.  Please show the CHEMOTHERAPY ALERT CARD or IMMUNOTHERAPY  ALERT CARD at check-in to the Emergency Department and triage nurse.  Should you have questions after your visit or need to cancel or reschedule your appointment, please contact [facility]  Dept: [phone]  and follow the prompts.  Office hours are 8:00 a.m. to 4:30 p.m. Monday - Friday. Please note that voicemails left after 4:00 p.m. may not be returned until the following business day.  We are closed weekends and major holidays. You have access to a nurse at all times for urgent questions. Please call the main number to the clinic Dept: [phone] and follow the prompts.   For any non-urgent questions, you may also contact your provider using MyChart. We now offer e-Visits for anyone 18 and older to request care online for non-urgent symptoms. For details visit [email or web address].   Also download the MyChart app! Go to the app store, search "MyChart", open the app, select , and log in with your MyChart username and password.  Due to Covid, a mask is required upon entering the hospital/clinic. If you do not have a mask, one will be given to you upon arrival. For doctor visits, patients may have 1 support person aged 18 or older with them. For treatment visits, patients cannot have anyone with them due to current Covid guidelines and our immunocompromised population.   

## 2021-10-30 NOTE — Progress Notes (Signed)
Per Dr. Lindi Adie - okay to proceed with ANC count of 1.1. Pt scheduled for GSF injection.

## 2021-10-30 NOTE — Progress Notes (Signed)
Per MD, okay to treat today with ANC 1.1 as pt is scheduled for injection prior to next treatment

## 2021-11-04 ENCOUNTER — Inpatient Hospital Stay: Payer: No Typology Code available for payment source

## 2021-11-04 ENCOUNTER — Other Ambulatory Visit: Payer: Self-pay

## 2021-11-04 VITALS — BP 111/85 | HR 83 | Temp 98.3°F | Resp 18

## 2021-11-04 DIAGNOSIS — C50411 Malignant neoplasm of upper-outer quadrant of right female breast: Secondary | ICD-10-CM | POA: Diagnosis not present

## 2021-11-04 DIAGNOSIS — Z95828 Presence of other vascular implants and grafts: Secondary | ICD-10-CM

## 2021-11-04 MED ORDER — FILGRASTIM-AAFI 300 MCG/0.5ML IJ SOSY
300.0000 ug | PREFILLED_SYRINGE | Freq: Once | INTRAMUSCULAR | Status: AC
  Administered 2021-11-04: 300 ug via SUBCUTANEOUS
  Filled 2021-11-04: qty 0.5

## 2021-11-05 ENCOUNTER — Other Ambulatory Visit: Payer: No Typology Code available for payment source

## 2021-11-05 ENCOUNTER — Ambulatory Visit: Payer: No Typology Code available for payment source | Admitting: Hematology and Oncology

## 2021-11-05 ENCOUNTER — Ambulatory Visit: Payer: No Typology Code available for payment source

## 2021-11-05 MED FILL — Dexamethasone Sodium Phosphate Inj 100 MG/10ML: INTRAMUSCULAR | Qty: 1 | Status: AC

## 2021-11-05 NOTE — Progress Notes (Signed)
Patient Care Team: Antony Contras, MD as PCP - General (Family Medicine) Eppie Gibson, MD as Attending Physician (Radiation Oncology) Rockwell Germany, RN as Oncology Nurse Navigator Mauro Kaufmann, RN as Oncology Nurse Navigator Sanjuana Kava, MD as Referring Physician (Obstetrics and Gynecology) [name], MD as Consulting Physician (Plastic Surgery) Nicholas Lose, MD as Consulting Physician (Hematology and Oncology) Rolm Bookbinder, MD as Consulting Physician (General Surgery)  DIAGNOSIS:    ICD-10-CM   1. Malignant neoplasm of upper-outer quadrant of right breast in female, estrogen receptor positive ([place])  C50.411    Z17.0       SUMMARY OF ONCOLOGIC HISTORY: Oncology History  Malignant neoplasm of upper-outer quadrant of right breast in female, estrogen receptor positive ([place])  06/04/2021 Initial Diagnosis   status post right breast upper outer quadrant biopsy 06/04/2021 for a clinical mT2 N0 invasive ductal carcinoma, grade 3, estrogen and progesterone receptor inadequate (functionally triple negative with HER2 not amplified), with an MIB-1 of 70%             (A) chest CT with contrast 06/22/2021 shows right breast mass, no other lesions of concern; pulmonary nodules all less than 5 mm noted, likely benign             (B) bone scan 06/22/2021 negative for metastases; consistent with sacroiliitis   06/10/2021 Cancer Staging   Staging form: Breast, AJCC 8th Edition - Clinical stage from 06/10/2021: Stage IIA (cT2, cN0, cM0, G3, ER+, PR+, HER2-) - Signed by Chauncey Cruel, MD on 06/10/2021 Stage prefix: Initial diagnosis Histologic grading system: 3 grade system Laterality: Right Staged by: Pathologist and managing physician Stage used in treatment planning: Yes National guidelines used in treatment planning: Yes Type of national guideline used in treatment planning: NCCN    06/16/2021 Genetic Testing   Ambry CancerNext-Expanded was negative. No pathogenic variants  were identified. 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 Report date is 06/19/2021.  The CancerNext-Expanded gene panel offered by [facility] and includes sequencing, rearrangement, and RNA analysis for the following 77 genes: AIP, ALK, APC, ATM, AXIN2, BAP1, BARD1, BLM, BMPR1A, BRCA1, BRCA2, BRIP1, CDC73, CDH1, CDK4, CDKN1B, CDKN2A, CHEK2, CTNNA1, DICER1, FANCC, FH, FLCN, GALNT12, KIF1B, LZTR1, MAX, MEN1, MET, MLH1, MSH2, MSH3, MSH6, MUTYH, NBN, NF1, NF2, NTHL1, PALB2, PHOX2B, PMS2, POT1, PRKAR1A, PTCH1, PTEN, RAD51C, RAD51D, RB1, RECQL, RET, SDHA, SDHAF2, SDHB, SDHC, SDHD, SMAD4, SMARCA4, SMARCB1, SMARCE1, STK11, SUFU, TMEM127, TP53, TSC1, TSC2, VHL and XRCC2 (sequencing and deletion/duplication); EGFR, EGLN1, HOXB13, KIT, MITF, PDGFRA, POLD1, and POLE (sequencing only); EPCAM and GREM1 (deletion/duplication only).    06/25/2021 -  Neo-Adjuvant Chemotherapy   neoadjuvant chemotherapy consisting of doxorubicin and cyclophosphamide in dose dense fashion x4 started 0/13/2022, completed 08/10/2021, to be followed by weekly carboplatin and paclitaxel x12 starting 08/24/2021     CHIEF COMPLIANT: Cycle 11 Carboplatin and Taxol  INTERVAL HISTORY: Ebony Lamb is a 46 y.o. with above-mentioned history of weakly estrogen receptor positive breast cancer. She is currently on neoadjuvant chemotherapy. She presents to the clinic today for treatment.  She has noticed mild tingling of the left little and ring fingers.  It could be because of ulnar nerve compression.  Denies any mouth sores or thrush.  ALLERGIES:  is allergic to cefepime.  MEDICATIONS:  Current Outpatient Medications  Medication Sig Dispense Refill   lidocaine-prilocaine (EMLA) cream Apply to affected area once 30 g 3   loratadine (CLARITIN) 10 MG tablet Take 1 tablet (  10 mg total) by mouth daily. Take day post chemo and then daily for 10 days with each treatment 30 tablet 3   LORazepam (ATIVAN) 0.5 MG  tablet Take 1 tablet (0.5 mg total) by mouth at bedtime as needed (Nausea or vomiting). 30 tablet 0   metoCLOPramide (REGLAN) 5 MG tablet Take 1 tablet (5 mg total) by mouth every 6 (six) hours as needed for nausea. 90 tablet 1   prochlorperazine (COMPAZINE) 10 MG tablet Take 1 tablet (10 mg total) by mouth every 6 (six) hours as needed (Nausea or vomiting). 30 tablet 1   No current facility-administered medications for this visit.   Facility-Administered Medications Ordered in Other Visits  Medication Dose Route Frequency Provider Last Rate Last Admin   influenza vac split quadrivalent PF (FLUARIX) 0.5 ML injection            influenza vac split quadrivalent PF (FLUARIX) injection 0.5 mL  0.5 mL Intramuscular Once Magrinat, Virgie Dad, MD        PHYSICAL EXAMINATION: ECOG PERFORMANCE STATUS: 1 - Symptomatic but completely ambulatory  Vitals:   11/06/21 0927  BP: 129/80  Pulse: 84  Resp: 18  Temp: 97.8 F (36.6 C)  SpO2: 100%   Filed Weights   11/06/21 0927  Weight: 119 lb 3.2 oz (54.1 kg)    LABORATORY DATA:  I have reviewed the data as listed CMP Latest Ref Rng & Units 10/30/2021 10/23/2021 10/16/2021  Glucose 70 - 99 mg/dL 102(H) 105(H) 111(H)  BUN 6 - 20 mg/dL '11 8 9  ' Creatinine 0.44 - 1.00 mg/dL 0.57 0.55 0.63  Sodium 135 - 145 mmol/L 139 139 140  Potassium 3.5 - 5.1 mmol/L 3.8 3.8 3.7  Chloride 98 - 111 mmol/L 107 108 108  CO2 22 - 32 mmol/L '27 26 26  ' Calcium 8.9 - 10.3 mg/dL 9.2 9.0 9.5  Total Protein 6.5 - 8.1 g/dL 6.7 6.4(L) 6.7  Total Bilirubin 0.3 - 1.2 mg/dL 0.4 0.3 0.4  Alkaline Phos 38 - 126 U/L 65 69 73  AST 15 - 41 U/L '25 19 29  ' ALT 0 - 44 U/L '27 19 28    ' Lab Results  Component Value Date   WBC 7.6 11/06/2021   HGB 11.7 (L) 11/06/2021   HCT 33.8 (L) 11/06/2021   MCV 98.5 11/06/2021   PLT 179 11/06/2021   NEUTROABS 6.0 11/06/2021    ASSESSMENT & PLAN:  Malignant neoplasm of upper-outer quadrant of right breast in female, estrogen receptor positive  ([place]) right breast upper outer quadrant biopsy 06/04/2021 for a clinical mT2 N0 invasive ductal carcinoma, grade 3, estrogen and progesterone receptor weakly positive, HER2 not amplified, with an MIB-1 of 70% CT CAP and Bone Scan: Neg   Treatment Plan: 1. Neo adj chemo with AC X 4 followed by Taxol and Carboplatin weekly X 12 2. Breast Conserving surgery with SLN surgery 3. Adjuvant XRT 4. Adj Anti estrogen therapy ------------------------------------------------------------------------------------------------------------------- Current Treatment: Completed 4 cycles of AC, today is cycle 10 Taxol with Carboplatin Chemo Toxicities: Fatigue Chemo induced anemia: Hemoglobin was 10.8 Leukopenia: Significant improvement with the Granix injection.  He would like to skip next weeks of Granix injection and see if she can handle the rest of the treatments. Fevers following chemotherapy: She was given a prescription for antibiotics if her fever comes back..     I set her up for a breast MRI to be done 11/09/2021. We will set up an injection appointment after the last cycle of treatment  as well because she will be getting a colonoscopy on 11/20/2021. I will see her on 11/11/2021 to discuss results of the breast MRI. She is following up with Dr. Iran Planas. She plans to do a mastectomy and expanders in [place] and then undergo a cutaneous flap based reconstruction at [facility].    Her surgery date has been set up for 12/07/2021 Return to clinic on 11/11/2021    No orders of the defined types were placed in this encounter.  The patient has a good understanding of the overall plan. she agrees with it. she will call with any problems that may develop before the next visit here.  Total time spent: 30 mins including face to face time and time spent for planning, charting and coordination of care  Rulon Eisenmenger, MD, MPH 11/06/2021  I, Thana Ates, am acting as scribe for Dr. Nicholas Lose.  I have  reviewed the above documentation for accuracy and completeness, and I agree with the above.

## 2021-11-06 ENCOUNTER — Inpatient Hospital Stay: Payer: No Typology Code available for payment source

## 2021-11-06 ENCOUNTER — Inpatient Hospital Stay (HOSPITAL_BASED_OUTPATIENT_CLINIC_OR_DEPARTMENT_OTHER): Payer: No Typology Code available for payment source | Admitting: Hematology and Oncology

## 2021-11-06 ENCOUNTER — Other Ambulatory Visit: Payer: Self-pay

## 2021-11-06 ENCOUNTER — Encounter: Payer: Self-pay | Admitting: *Deleted

## 2021-11-06 DIAGNOSIS — Z170 Estrogen receptor positive status [ER+]: Secondary | ICD-10-CM

## 2021-11-06 DIAGNOSIS — C50411 Malignant neoplasm of upper-outer quadrant of right female breast: Secondary | ICD-10-CM

## 2021-11-06 DIAGNOSIS — Z95828 Presence of other vascular implants and grafts: Secondary | ICD-10-CM

## 2021-11-06 LAB — CBC WITH DIFFERENTIAL (CANCER CENTER ONLY)
Abs Immature Granulocytes: 0.05 10*3/uL (ref 0.00–0.07)
Basophils Absolute: 0 10*3/uL (ref 0.0–0.1)
Basophils Relative: 0 %
Eosinophils Absolute: 0 10*3/uL (ref 0.0–0.5)
Eosinophils Relative: 0 %
HCT: 33.8 % — ABNORMAL LOW (ref 36.0–46.0)
Hemoglobin: 11.7 g/dL — ABNORMAL LOW (ref 12.0–15.0)
Immature Granulocytes: 1 %
Lymphocytes Relative: 15 %
Lymphs Abs: 1.1 10*3/uL (ref 0.7–4.0)
MCH: 34.1 pg — ABNORMAL HIGH (ref 26.0–34.0)
MCHC: 34.6 g/dL (ref 30.0–36.0)
MCV: 98.5 fL (ref 80.0–100.0)
Monocytes Absolute: 0.4 10*3/uL (ref 0.1–1.0)
Monocytes Relative: 5 %
Neutro Abs: 6 10*3/uL (ref 1.7–7.7)
Neutrophils Relative %: 79 %
Platelet Count: 179 10*3/uL (ref 150–400)
RBC: 3.43 MIL/uL — ABNORMAL LOW (ref 3.87–5.11)
RDW: 13 % (ref 11.5–15.5)
WBC Count: 7.6 10*3/uL (ref 4.0–10.5)
nRBC: 0 % (ref 0.0–0.2)

## 2021-11-06 LAB — CMP (CANCER CENTER ONLY)
ALT: 31 U/L (ref 0–44)
AST: 30 U/L (ref 15–41)
Albumin: 4.2 g/dL (ref 3.5–5.0)
Alkaline Phosphatase: 71 U/L (ref 38–126)
Anion gap: 5 (ref 5–15)
BUN: 8 mg/dL (ref 6–20)
CO2: 27 mmol/L (ref 22–32)
Calcium: 9.4 mg/dL (ref 8.9–10.3)
Chloride: 108 mmol/L (ref 98–111)
Creatinine: 0.58 mg/dL (ref 0.44–1.00)
GFR, Estimated: >60 mL/min (ref >60)
Glucose, Bld: 104 mg/dL — ABNORMAL HIGH (ref 70–99)
Potassium: 3.6 mmol/L (ref 3.5–5.1)
Sodium: 140 mmol/L (ref 135–145)
Total Bilirubin: 0.4 mg/dL (ref 0.3–1.2)
Total Protein: 6.5 g/dL (ref 6.5–8.1)

## 2021-11-06 LAB — PREGNANCY, URINE: Preg Test, Ur: NEGATIVE

## 2021-11-06 MED ORDER — HEPARIN SOD (PORK) LOCK FLUSH 100 UNIT/ML IV SOLN
500.0000 [IU] | Freq: Once | INTRAVENOUS | Status: AC | PRN
  Administered 2021-11-06: 500 [IU]

## 2021-11-06 MED ORDER — SODIUM CHLORIDE 0.9 % IV SOLN
45.0000 mg/m2 | Freq: Once | INTRAVENOUS | Status: AC
  Administered 2021-11-06: 72 mg via INTRAVENOUS
  Filled 2021-11-06: qty 12

## 2021-11-06 MED ORDER — SODIUM CHLORIDE 0.9 % IV SOLN: Freq: Once | INTRAVENOUS | Status: AC

## 2021-11-06 MED ORDER — SODIUM CHLORIDE 0.9 % IV SOLN
10.0000 mg | Freq: Once | INTRAVENOUS | Status: AC
  Administered 2021-11-06: 10 mg via INTRAVENOUS
  Filled 2021-11-06: qty 10

## 2021-11-06 MED ORDER — FILGRASTIM-AAFI 300 MCG/0.5ML IJ SOSY: 300.0000 ug | PREFILLED_SYRINGE | Freq: Once | INTRAMUSCULAR | Status: DC

## 2021-11-06 MED ORDER — SODIUM CHLORIDE 0.9% FLUSH
10.0000 mL | Freq: Once | INTRAVENOUS | Status: AC
  Administered 2021-11-06: 10 mL

## 2021-11-06 MED ORDER — FAMOTIDINE IN NACL 20-0.9 MG/50ML-% IV SOLN
20.0000 mg | Freq: Once | INTRAVENOUS | Status: AC
  Administered 2021-11-06: 20 mg via INTRAVENOUS
  Filled 2021-11-06: qty 50

## 2021-11-06 MED ORDER — SODIUM CHLORIDE 0.9% FLUSH
10.0000 mL | INTRAVENOUS | Status: DC | PRN
  Administered 2021-11-06: 10 mL

## 2021-11-06 MED ORDER — DIPHENHYDRAMINE HCL 50 MG/ML IJ SOLN
25.0000 mg | Freq: Once | INTRAMUSCULAR | Status: AC
  Administered 2021-11-06: 25 mg via INTRAVENOUS
  Filled 2021-11-06: qty 1

## 2021-11-06 MED ORDER — SODIUM CHLORIDE 0.9 % IV SOLN
105.3000 mg | Freq: Once | INTRAVENOUS | Status: AC
  Administered 2021-11-06: 110 mg via INTRAVENOUS
  Filled 2021-11-06: qty 11

## 2021-11-06 NOTE — Assessment & Plan Note (Signed)
right breast upper outer quadrant biopsy 06/04/2021 for a clinical mT2 N0 invasive ductal carcinoma, grade 3, estrogen and progesterone receptor weakly positive, HER2 not amplified, with an MIB-1 of 70% °CT CAP and Bone Scan: Neg °  °Treatment Plan: °1. Neo adj chemo with AC X 4 followed by Taxol and Carboplatin weekly X 12 °2. Breast Conserving surgery with SLN surgery °3. Adjuvant XRT °4. Adj Anti estrogen therapy °------------------------------------------------------------------------------------------------------------------- °Current Treatment: Completed 4 cycles of AC, today is cycle 10 Taxol with Carboplatin °Chemo Toxicities: °1. Fatigue °2. Chemo induced anemia: Hemoglobin was 10.8 °3. Leukopenia: Significant improvement with the Granix injection.  He would like to skip next weeks of Granix injection and see if she can handle the rest of the treatments. °4. Fevers following chemotherapy: She was given a prescription for antibiotics if her fever comes back..   °  °I set her up for a breast MRI to be done 11/09/2021. °We will set up an injection appointment after the last cycle of treatment as well because she will be getting a colonoscopy on 11/20/2021. °I will see her on 11/11/2021 to discuss results of the breast MRI. °She is following up with Dr. Thimmappa. ° °Return to clinic on 11/11/2021 °

## 2021-11-08 ENCOUNTER — Other Ambulatory Visit: Payer: No Typology Code available for payment source

## 2021-11-09 ENCOUNTER — Ambulatory Visit
Admission: RE | Admit: 2021-11-09 | Discharge: 2021-11-09 | Disposition: A | Discharge status: Home / Self Care | Payer: No Typology Code available for payment source | Source: Ambulatory Visit | Attending: Hematology and Oncology | Admitting: Hematology and Oncology

## 2021-11-09 ENCOUNTER — Other Ambulatory Visit: Payer: Self-pay

## 2021-11-09 ENCOUNTER — Telehealth: Payer: Self-pay | Admitting: Hematology and Oncology

## 2021-11-09 DIAGNOSIS — C50411 Malignant neoplasm of upper-outer quadrant of right female breast: Secondary | ICD-10-CM

## 2021-11-09 DIAGNOSIS — Z170 Estrogen receptor positive status [ER+]: Secondary | ICD-10-CM

## 2021-11-09 IMAGING — MR MR BREAST BILAT WO/W CM
8 of 12 series · 31 of 48 positions shown · IV contrast (6 ML GADAVIST)
Comparison: MRI on [DATE] and ultrasound [DATE] and earlier

CLINICAL DATA: Status post neoadjuvant 4 RIGHT breast malignancy.
Patient ultrasound-guided core biopsies performed in [DATE],
showing grade 3 invasive ductal carcinoma in the RIGHT breast 12
o'clock location and benign concordant biopsy of axillary lymph
node. Two additional satellite lesions were identified, 1 of which
was marked with a tissue marker clip. Interval ultrasound shows
smaller size of each of the masses.

EXAM:
BILATERAL BREAST MRI WITH AND WITHOUT CONTRAST
TECHNIQUE: Multiplanar, multisequence MR images of both breasts were obtained
prior to and following the intravenous administration of 6 ml of
Gadavist

[Series 2: t2_tirm_tra ipat (a-p) · axial · 3.0mm · 0.66mm/px · 1 of 55 slices shown]
[im 1/55]
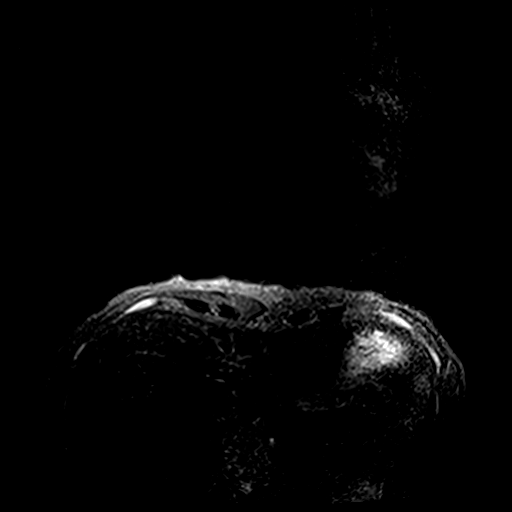

[Series 3: fl3d pre-cm no · axial · non-contrast · 1.2mm · 0.89mm/px · z∈[-105,+66]mm · 5 of 144 slices shown]
[im 1/144]
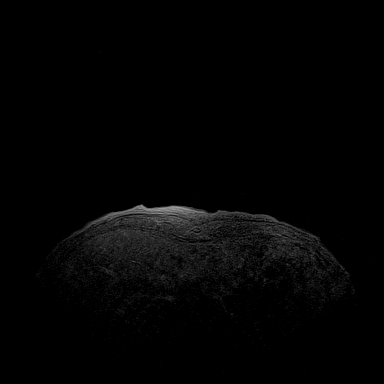
[im 36/144]
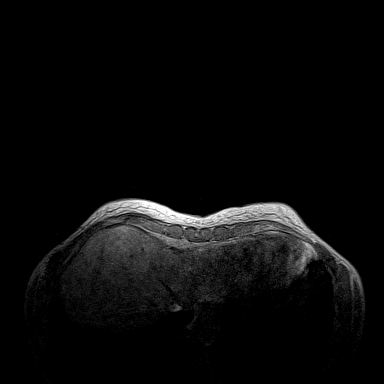
[im 72/144]
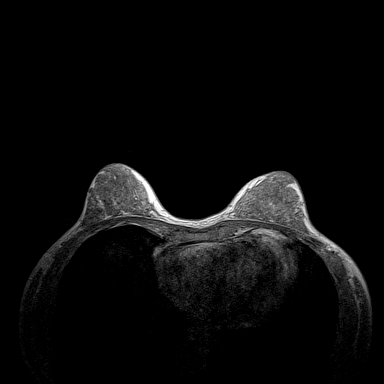
[im 108/144]
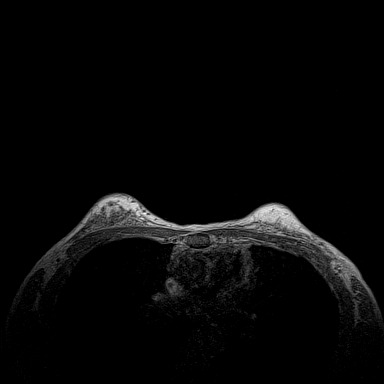
[im 144/144]
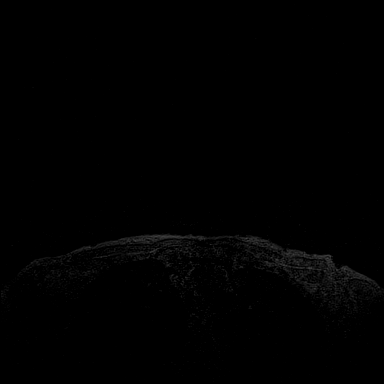

[Series 4: fl3d pre-cm · axial · non-contrast · 1.2mm · 0.89mm/px · z∈[-105,+66]mm · 5 of 144 slices shown]
[im 1/144]
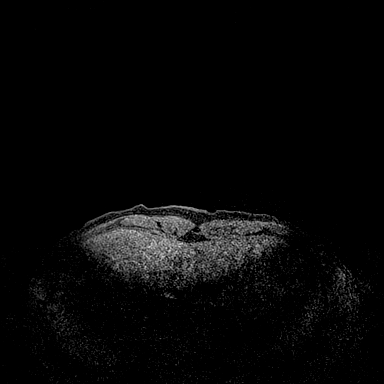
[im 36/144]
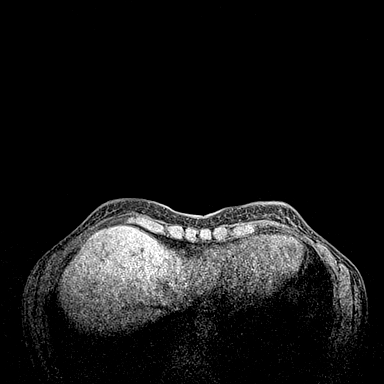
[im 72/144]
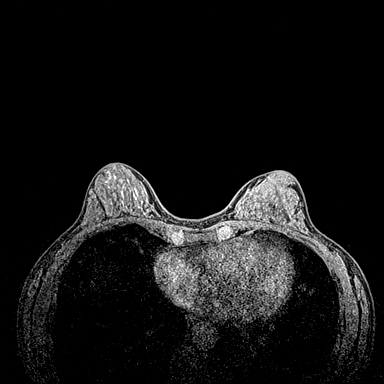
[im 108/144]
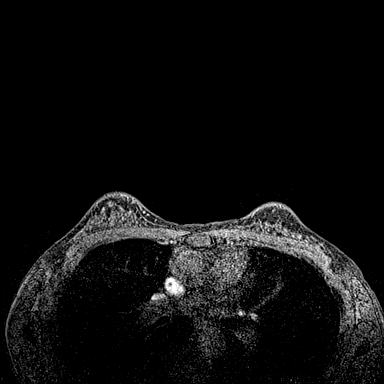
[im 144/144]
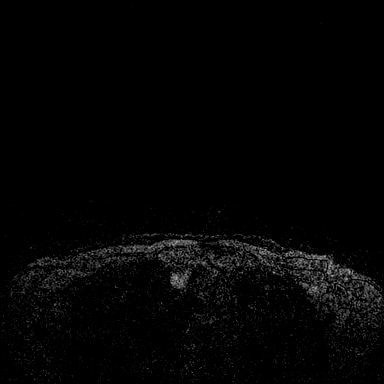

[Series 5: fl3d post-cm 20 · axial · 1.2mm · 0.89mm/px · z∈[-105,+66]mm · 5 of 144 slices shown (1 of 3)]
[im 1/144]
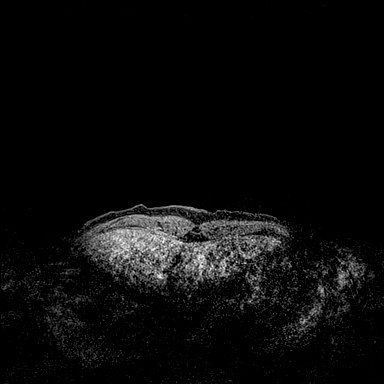
[im 36/144]
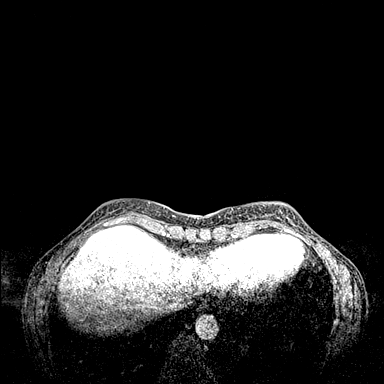
[im 72/144]
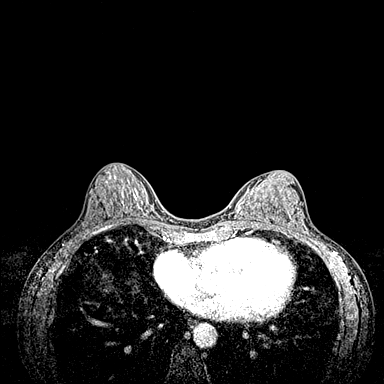
[im 108/144]
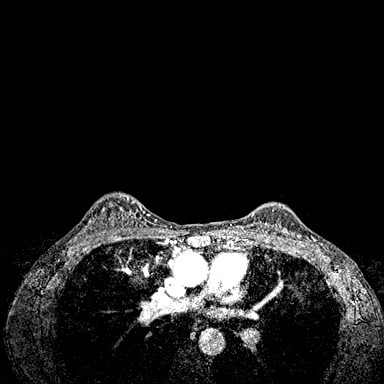
[im 144/144]
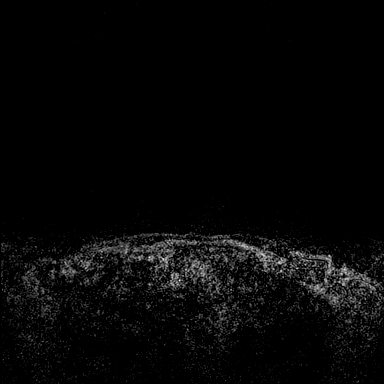

[Series 6: fl3d post-cm 20 · axial · 1.2mm · 0.89mm/px · z∈[-105,+66]mm · 5 of 144 slices shown (2 of 3)]
[im 1/144]
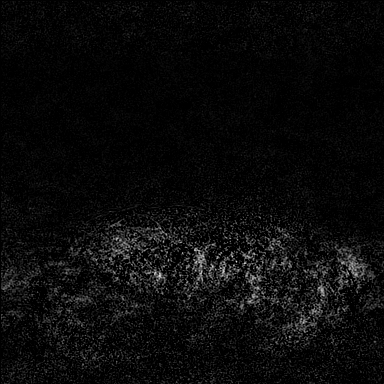
[im 36/144]
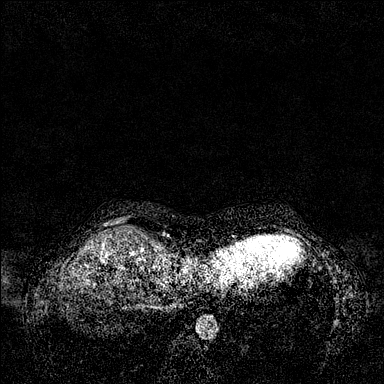
[im 72/144]
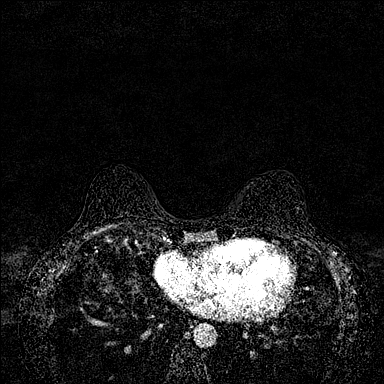
[im 108/144]
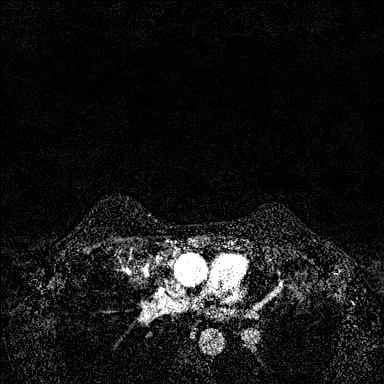
[im 144/144]
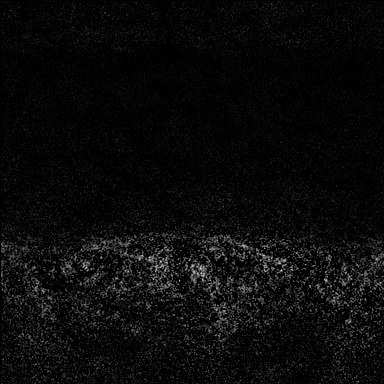

[Series 7: fl3d post-cm 20 · axial · 172.8mm · 0.89mm/px · 1 of 1 slices shown (3 of 3)]
[im 1/1]
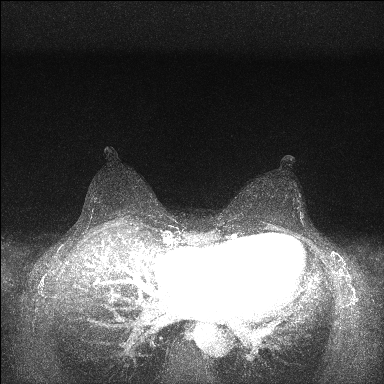

[Series 8: fl3d post-cm 3 · axial · 1.2mm · 0.89mm/px · z∈[-105,+66]mm · 6 of 144 slices shown (1 of 2)]
[im 1/144]
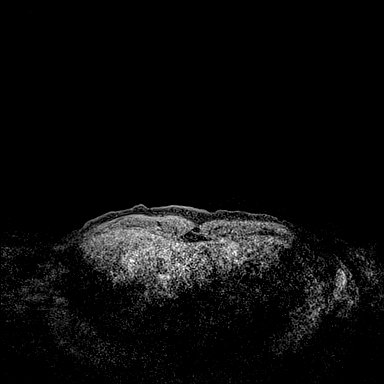
[im 29/144]
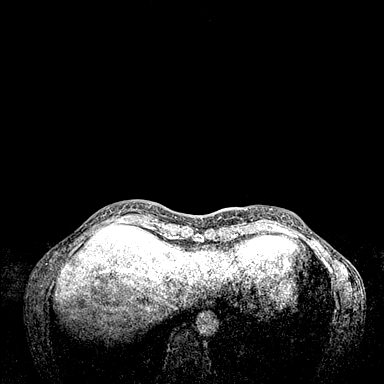
[im 58/144]
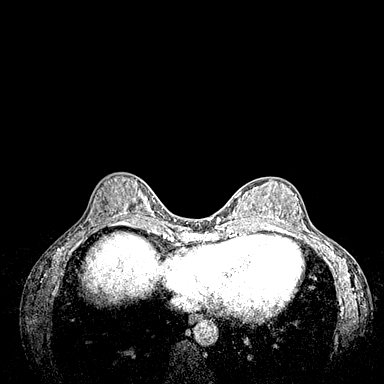
[im 86/144]
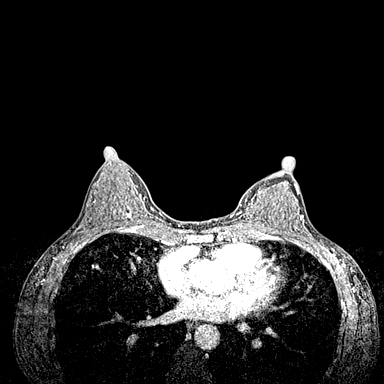
[im 115/144]
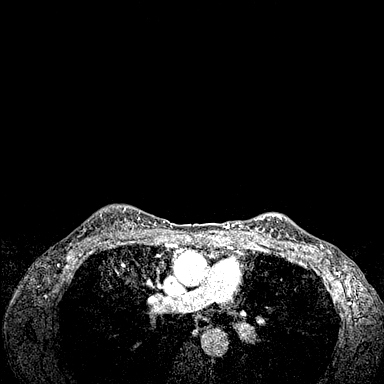
[im 144/144]
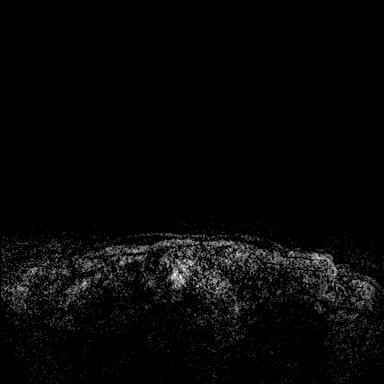

[Series 9: fl3d post-cm 3 · axial · 1.2mm · 0.89mm/px · z∈[-105,-37]mm · 3 of 144 slices shown (2 of 2)]
[im 1/144]
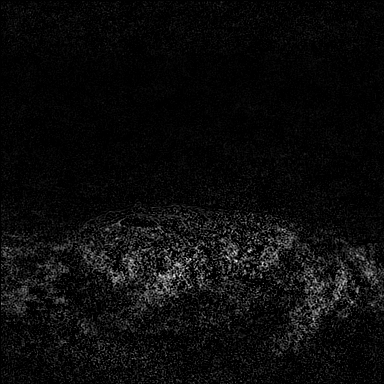
[im 29/144]
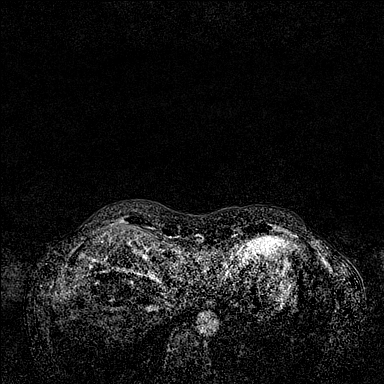
[im 58/144]
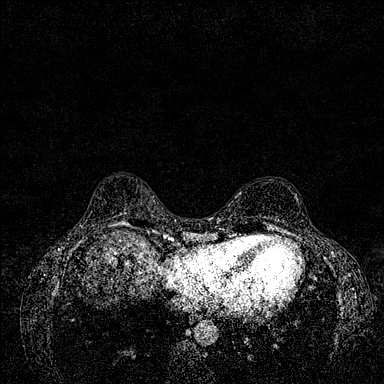

[31 of 48 positions shown; findings below may reference images not displayed]

Three-dimensional MR images were rendered by post-processing of the
original MR data on an independent workstation. The
three-dimensional MR images were interpreted, and findings are
reported in the following complete MRI report for this study. Three
dimensional images were evaluated at the independent interpreting
workstation using the DynaCAD thin client.
FINDINGS: Breast composition: d. Extreme fibroglandular tissue.

Background parenchymal enhancement: Minimal

Right breast: Tissue marker clip artifact is again identified in the
posterior central portion of the RIGHT breast, at the site of coil
shaped marking index lesion in the 12 o'clock location. (Image 56 of
series 3 and image 55 of series 12). In this region there is minimal
surrounding/slow persistent enhancement, spanning no more than
centimeters. (Image 58 of series 12.) slightly superior and anterior
to this clip, a second clip is identified, at the site of heart
shaped clip marking a satellite lesion. No significant enhancement
identified in this region. A ribbon shaped clip is identified in the
anterior portion of the RIGHT breast, from remote benign biopsy. No
enhancement is identified in this region. (Image 69 of series 3). No
new or suspicious findings in the RIGHT breast.

Left breast: No mass or abnormal enhancement.

Lymph nodes: No abnormal appearing lymph nodes.

Ancillary findings:  None.
IMPRESSION: 1. Near complete resolution 2.6 centimeter enhancing mass in the
UPPER-OUTER QUADRANT of the RIGHT breast. On today's exam, there is
only 1.1 centimeter area of enhancement immediately surrounding the
tissue marker clip.
2. 2 additional satellite lesions were identified sonographically
and improved on interval ultrasound. No enhancement of these lesions
today.
3. No RIGHT axillary adenopathy.
4. LEFT breast remains normal.

RECOMMENDATION:
Treatment plan for known RIGHT breast malignancy.

BI-RADS CATEGORY  6: Known biopsy-proven malignancy.

## 2021-11-09 MED ORDER — GADOBUTROL 1 MMOL/ML IV SOLN
6.0000 mL | Freq: Once | INTRAVENOUS | Status: AC | PRN
  Administered 2021-11-09: 6 mL via INTRAVENOUS

## 2021-11-09 NOTE — Telephone Encounter (Signed)
Scheduled appointment per 2/24 los. Called patient to update her on the changes made to her upcoming appointment. Called patient twice and patient hung up on the scheduler both times. Called the patients husband [name]). Left message with [name].

## 2021-11-10 ENCOUNTER — Encounter: Payer: Self-pay | Admitting: *Deleted

## 2021-11-10 ENCOUNTER — Other Ambulatory Visit: Payer: Self-pay | Admitting: General Surgery

## 2021-11-11 ENCOUNTER — Encounter: Payer: Self-pay | Admitting: Adult Health

## 2021-11-11 ENCOUNTER — Inpatient Hospital Stay: Payer: No Typology Code available for payment source

## 2021-11-11 ENCOUNTER — Other Ambulatory Visit: Payer: Self-pay

## 2021-11-11 ENCOUNTER — Inpatient Hospital Stay: Payer: No Typology Code available for payment source | Attending: Oncology | Admitting: Adult Health

## 2021-11-11 VITALS — BP 124/84 | HR 91 | Temp 97.7°F | Resp 16 | Ht 66.0 in | Wt 118.2 lb

## 2021-11-11 DIAGNOSIS — Z95828 Presence of other vascular implants and grafts: Secondary | ICD-10-CM

## 2021-11-11 DIAGNOSIS — D6481 Anemia due to antineoplastic chemotherapy: Secondary | ICD-10-CM | POA: Insufficient documentation

## 2021-11-11 DIAGNOSIS — D709 Neutropenia, unspecified: Secondary | ICD-10-CM | POA: Diagnosis not present

## 2021-11-11 DIAGNOSIS — R5383 Other fatigue: Secondary | ICD-10-CM | POA: Diagnosis not present

## 2021-11-11 DIAGNOSIS — C50411 Malignant neoplasm of upper-outer quadrant of right female breast: Secondary | ICD-10-CM | POA: Diagnosis present

## 2021-11-11 DIAGNOSIS — Z9221 Personal history of antineoplastic chemotherapy: Secondary | ICD-10-CM | POA: Diagnosis not present

## 2021-11-11 DIAGNOSIS — H00012 Hordeolum externum right lower eyelid: Secondary | ICD-10-CM | POA: Diagnosis not present

## 2021-11-11 DIAGNOSIS — Z5111 Encounter for antineoplastic chemotherapy: Secondary | ICD-10-CM | POA: Insufficient documentation

## 2021-11-11 DIAGNOSIS — T451X5A Adverse effect of antineoplastic and immunosuppressive drugs, initial encounter: Secondary | ICD-10-CM | POA: Diagnosis not present

## 2021-11-11 DIAGNOSIS — Z170 Estrogen receptor positive status [ER+]: Secondary | ICD-10-CM | POA: Diagnosis not present

## 2021-11-11 DIAGNOSIS — Z8041 Family history of malignant neoplasm of ovary: Secondary | ICD-10-CM | POA: Diagnosis not present

## 2021-11-11 DIAGNOSIS — Z5189 Encounter for other specified aftercare: Secondary | ICD-10-CM | POA: Insufficient documentation

## 2021-11-11 DIAGNOSIS — Z803 Family history of malignant neoplasm of breast: Secondary | ICD-10-CM | POA: Insufficient documentation

## 2021-11-11 MED ORDER — FILGRASTIM-AAFI 300 MCG/0.5ML IJ SOSY
300.0000 ug | PREFILLED_SYRINGE | Freq: Once | INTRAMUSCULAR | Status: AC
  Administered 2021-11-11: 300 ug via SUBCUTANEOUS
  Filled 2021-11-11: qty 0.5

## 2021-11-11 MED ORDER — BACITRA-NEOMYCIN-POLYMYXIN-HC 1 % OP OINT: 1.0000 "application " | TOPICAL_OINTMENT | Freq: Four times a day (QID) | OPHTHALMIC | 0 refills | Status: DC

## 2021-11-11 NOTE — Progress Notes (Signed)
[facility] Follow up:    Antony Contras, MD [place] [address]   DIAGNOSIS:  Cancer Staging  Malignant neoplasm of upper-outer quadrant of right breast in female, estrogen receptor positive ([place]) Staging form: Breast, AJCC 8th Edition - Clinical stage from 06/10/2021: Stage IIA (cT2, cN0, cM0, G3, ER+, PR+, HER2-) - Signed by Chauncey Cruel, MD on 06/10/2021 Stage prefix: Initial diagnosis Histologic grading system: 3 grade system Laterality: Right Staged by: Pathologist and managing physician Stage used in treatment planning: Yes National guidelines used in treatment planning: Yes Type of national guideline used in treatment planning: NCCN   SUMMARY OF ONCOLOGIC HISTORY: Oncology History  Malignant neoplasm of upper-outer quadrant of right breast in female, estrogen receptor positive ([place])  06/04/2021 Initial Diagnosis   status post right breast upper outer quadrant biopsy 06/04/2021 for a clinical mT2 N0 invasive ductal carcinoma, grade 3, estrogen and progesterone receptor inadequate (functionally triple negative with HER2 not amplified), with an MIB-1 of 70%             (A) chest CT with contrast 06/22/2021 shows right breast mass, no other lesions of concern; pulmonary nodules all less than 5 mm noted, likely benign             (B) bone scan 06/22/2021 negative for metastases; consistent with sacroiliitis   06/10/2021 Cancer Staging   Staging form: Breast, AJCC 8th Edition - Clinical stage from 06/10/2021: Stage IIA (cT2, cN0, cM0, G3, ER+, PR+, HER2-) - Signed by Chauncey Cruel, MD on 06/10/2021 Stage prefix: Initial diagnosis Histologic grading system: 3 grade system Laterality: Right Staged by: Pathologist and managing physician Stage used in treatment planning: Yes National guidelines used in treatment planning: Yes Type of national guideline used in treatment planning: NCCN    06/16/2021 Genetic Testing   [facility]  CancerNext-Expanded was negative. No pathogenic variants were identified. 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 Report date is 06/19/2021.  The CancerNext-Expanded gene panel offered by [facility] and includes sequencing, rearrangement, and RNA analysis for the following 77 genes: AIP, ALK, APC, ATM, AXIN2, BAP1, BARD1, BLM, BMPR1A, BRCA1, BRCA2, BRIP1, CDC73, CDH1, CDK4, CDKN1B, CDKN2A, CHEK2, CTNNA1, DICER1, FANCC, FH, FLCN, GALNT12, KIF1B, LZTR1, MAX, MEN1, MET, MLH1, MSH2, MSH3, MSH6, MUTYH, NBN, NF1, NF2, NTHL1, PALB2, PHOX2B, PMS2, POT1, PRKAR1A, PTCH1, PTEN, RAD51C, RAD51D, RB1, RECQL, RET, SDHA, SDHAF2, SDHB, SDHC, SDHD, SMAD4, SMARCA4, SMARCB1, SMARCE1, STK11, SUFU, TMEM127, TP53, TSC1, TSC2, VHL and XRCC2 (sequencing and deletion/duplication); EGFR, EGLN1, HOXB13, KIT, MITF, PDGFRA, POLD1, and POLE (sequencing only); EPCAM and GREM1 (deletion/duplication only).    06/25/2021 -  Neo-Adjuvant Chemotherapy   neoadjuvant chemotherapy consisting of doxorubicin and cyclophosphamide in dose dense fashion x4 started 0/13/2022, completed 08/10/2021, to be followed by weekly carboplatin and paclitaxel x12 starting 08/24/2021     CURRENT THERAPY: cycle 12 taxol/carbo  INTERVAL HISTORY: AUDRY KAUZLARICH 46 y.o. female returns for evaluation prior to receiving her final cycle of neoadjuvant chemotherapy.  She underwent MRI of her breasts on 11/09/2021 which showed near complete resolution of her RUOQ breast enhancing mass, resolution of 2 additional satellite lesions, and no right axillary adenopathy.    Esbeydi is doing well.  She denies any peripheral neuropathy.  She has a stye on her right eye and she has been applying warm compresses without relief.     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  MEDICAL HISTORY: Past Medical History:  Diagnosis Date   Abnormal Pap smear 09/13/2000   Allergy 02/2020   Seasonal   Breast cancer ([place])    Depression    history of depression- denies currently   H/O varicella    Hypertension 09/14/2011   post partum- meds x 1 mo   No pertinent past medical history    Preterm labor     SURGICAL HISTORY: Past Surgical History:  Procedure Laterality Date   BREAST BIOPSY Right 07/27/2019   CESAREAN SECTION  11/14/2011   Procedure: CESAREAN SECTION;  Surgeon: Eli Hose, MD;  Location: [facility];  Service: Gynecology;  Laterality: N/A;  Primary cesarean section with delivery of baby boy at [id number]. Apgars 9/9.   CESAREAN SECTION N/A 03/22/2014   Procedure: Repeat CESAREAN SECTION;  Surgeon: Alwyn Pea, MD;  Location: [facility];  Service: Obstetrics;  Laterality: N/A;   COSMETIC SURGERY  11/22/2017   Abdominoplasty   FOOT SURGERY     FRACTURE SURGERY  08/1999   Repair of 5th metatarsal in left foot   HERNIA REPAIR  11/22/2017   PORTACATH PLACEMENT N/A 06/24/2021   Procedure: INSERTION PORT-A-CATH;  Surgeon: Coralie Keens, MD;  Location: [facility];  Service: General;  Laterality: N/A;   TUBAL LIGATION  11/22/2017   Tubes were completely removed   WISDOM TOOTH EXTRACTION      SOCIAL HISTORY: Social History   Socioeconomic History   Marital status: Married    Spouse name: Not on file    Number of children: Not on file   Years of education: Not on file   Highest education level: Not on file  Occupational History   Not on file  Tobacco Use   Smoking status: Never   Smokeless tobacco: Never  Vaping Use   Vaping Use: Never used  Substance and Sexual Activity   Alcohol use: Yes    Alcohol/week: 7.0 standard drinks    Types: 7 Standard drinks or equivalent per week    Comment: I stopped drinking alcohol after finding the lump in my brea   Drug use: No   Sexual activity: Yes    Birth control/protection: Surgical  Other Topics Concern   Not on file  Social History Narrative   Not on file   Social Determinants of Health   Financial Resource Strain: Low Risk    Difficulty of Paying Living Expenses: Not very hard  Food Insecurity: No Food Insecurity   Worried About Charity fundraiser in the Last Year: Never true   Ran Out of Food in the Last Year: Never true  Transportation Needs: No Transportation Needs   Lack of Transportation (Medical): No   Lack of Transportation (Non-Medical): No  Physical Activity: Not on file  Stress: Not on file  Social Connections: Not on file  Intimate  Partner Violence: Not on file    FAMILY HISTORY: Family History  Problem Relation Age of Onset   Breast cancer Mother 58   Osteopenia Mother    Arthritis Mother    Other Mother        BRCA2 gene mutation   Ulcerative colitis Mother    Hypertension Father    Kidney disease Father    Depression Father    Hyperlipidemia Father    Melanoma Father 91   Hypertension Brother    Hyperlipidemia Brother    Breast cancer Maternal Grandmother        dx. late 30s/40s   Heart disease Maternal Grandfather    Other Maternal Grandfather        BRCA2 gene mutation   Ovarian cancer Paternal Grandmother        dx. 62s   Stroke Paternal Grandfather    Birth defects Son    Anesthesia problems Neg Hx    Diabetes Neg Hx     Review of Systems  Constitutional:  Negative for appetite change,  chills, fatigue, fever and unexpected weight change.  HENT:   Negative for hearing loss, lump/mass and trouble swallowing.   Eyes:  Negative for eye problems and icterus.  Respiratory:  Negative for chest tightness, cough and shortness of breath.   Cardiovascular:  Negative for chest pain, leg swelling and palpitations.  Gastrointestinal:  Negative for abdominal distention, abdominal pain, constipation, diarrhea, nausea and vomiting.  Endocrine: Negative for hot flashes.  Genitourinary:  Negative for difficulty urinating.   Musculoskeletal:  Negative for arthralgias.  Skin:  Negative for itching and rash.  Neurological:  Negative for dizziness, extremity weakness, headaches and numbness.  Hematological:  Negative for adenopathy. Does not bruise/bleed easily.  Psychiatric/Behavioral:  Negative for depression. The patient is not nervous/anxious.      PHYSICAL EXAMINATION  ECOG PERFORMANCE STATUS: 1 - Symptomatic but completely ambulatory  Vitals:   11/11/21 1004  BP: 124/84  Pulse: 91  Resp: 16  Temp: 97.7 F (36.5 C)  SpO2: 100%    Physical Exam Constitutional:      General: She is not in acute distress.    Appearance: Normal appearance. She is not toxic-appearing.  HENT:     Head: Normocephalic and atraumatic.  Eyes:     General: No scleral icterus.    Comments: Right lower lid with erythema, swelling, + stye, no scleral injection or drainage.    Cardiovascular:     Rate and Rhythm: Normal rate and regular rhythm.     Pulses: Normal pulses.     Heart sounds: Normal heart sounds.  Pulmonary:     Effort: Pulmonary effort is normal.     Breath sounds: Normal breath sounds.  Abdominal:     General: Abdomen is flat. Bowel sounds are normal. There is no distension.     Palpations: Abdomen is soft.     Tenderness: There is no abdominal tenderness.  Musculoskeletal:        General: No swelling.     Cervical back: Neck supple.  Lymphadenopathy:     Cervical: No cervical  adenopathy.  Skin:    General: Skin is warm and dry.     Findings: No rash.  Neurological:     General: No focal deficit present.     Mental Status: She is alert.  Psychiatric:        Mood and Affect: Mood normal.        Behavior: Behavior normal.    LABORATORY  DATA:  CBC    Component Value Date/Time   WBC 7.6 11/06/2021 0910   WBC 9.9 09/12/2021 0028   RBC 3.43 (L) 11/06/2021 0910   HGB 11.7 (L) 11/06/2021 0910   HCT 33.8 (L) 11/06/2021 0910   PLT 179 11/06/2021 0910   MCV 98.5 11/06/2021 0910   MCH 34.1 (H) 11/06/2021 0910   MCHC 34.6 11/06/2021 0910   RDW 13.0 11/06/2021 0910   LYMPHSABS 1.1 11/06/2021 0910   MONOABS 0.4 11/06/2021 0910   EOSABS 0.0 11/06/2021 0910   BASOSABS 0.0 11/06/2021 0910    CMP     Component Value Date/Time   NA 140 11/06/2021 0910   K 3.6 11/06/2021 0910   CL 108 11/06/2021 0910   CO2 27 11/06/2021 0910   GLUCOSE 104 (H) 11/06/2021 0910   BUN 8 11/06/2021 0910   CREATININE 0.58 11/06/2021 0910   CALCIUM 9.4 11/06/2021 0910   PROT 6.5 11/06/2021 0910   ALBUMIN 4.2 11/06/2021 0910   AST 30 11/06/2021 0910   ALT 31 11/06/2021 0910   ALKPHOS 71 11/06/2021 0910   BILITOT 0.4 11/06/2021 0910   GFRNONAA >60 11/06/2021 0910   GFRAA >90 11/26/2011 2008     RADIOGRAPHIC STUDIES:  No results found.    ASSESSMENT and THERAPY PLAN:   Malignant neoplasm of upper-outer quadrant of right breast in female, estrogen receptor positive ([place]) right breast upper outer quadrant biopsy 06/04/2021 for a clinical mT2 N0 invasive ductal carcinoma, grade 3, estrogen and progesterone receptor weakly positive, HER2 not amplified, with an MIB-1 of 70% CT CAP and Bone Scan: Neg   Treatment Plan: 1. Neo adj chemo with AC X 4 followed by Taxol and Carboplatin weekly X 12 2. Breast Conserving surgery with SLN surgery 3. Adjuvant XRT 4. Adj Anti estrogen  therapy ------------------------------------------------------------------------------------------------------------------- Current Treatment: Completed 4 cycles of AC, Friday is cycle 12 Taxol with Carboplatin Chemo Toxicities: Fatigue Chemo induced anemia: Hemoglobin was 10.8 Leukopenia: Significant improvement with the Granix injection.  He would like to skip next weeks of Granix injection and see if she can handle the rest of the treatments. 4.  Right lower lid stye: I sent in a Cortisporin ophthalmic ointment for her to apply.    Shuntia has met with Dr. Donne Hazel and plans on skin sparing bilateral mastectomies.  This is scheduled 3/27.  She is going to talk to Dr. Iran Planas next week about her reconstruction.  Arica will see Dr. Lindi Adie on 4/4 for f/u.    All questions were answered. The patient knows to call the clinic with any problems, questions or concerns. We can certainly see the patient much sooner if necessary.  Total encounter time: 20 minutes in face to face visit time, chart review, lab review, care coordination, and documentation of the encounter.  Wilber Bihari, NP 11/12/21 12:49 AM Medical Oncology and Hematology [facility] [place], [place] [address] Tel. [phone]    Fax. [phone]  *Total Encounter Time as defined by the Centers for Medicare and Medicaid Services includes, in addition to the face-to-face time of a patient visit (documented in the note above) non-face-to-face time: obtaining and reviewing outside history, ordering and reviewing medications, tests or procedures, care coordination (communications with other health care professionals or caregivers) and documentation in the medical record.

## 2021-11-12 ENCOUNTER — Other Ambulatory Visit: Payer: No Typology Code available for payment source

## 2021-11-12 ENCOUNTER — Ambulatory Visit: Payer: No Typology Code available for payment source

## 2021-11-12 ENCOUNTER — Encounter: Payer: Self-pay | Admitting: Oncology

## 2021-11-12 ENCOUNTER — Ambulatory Visit
Admission: RE | Admit: 2021-11-12 | Discharge: 2021-11-12 | Disposition: A | Discharge status: Home / Self Care | Payer: No Typology Code available for payment source | Source: Ambulatory Visit | Attending: Hematology and Oncology | Admitting: Hematology and Oncology

## 2021-11-12 DIAGNOSIS — R42 Dizziness and giddiness: Secondary | ICD-10-CM

## 2021-11-12 DIAGNOSIS — C50411 Malignant neoplasm of upper-outer quadrant of right female breast: Secondary | ICD-10-CM

## 2021-11-12 DIAGNOSIS — Z170 Estrogen receptor positive status [ER+]: Secondary | ICD-10-CM

## 2021-11-12 MED ORDER — GADOBENATE DIMEGLUMINE 529 MG/ML IV SOLN
10.0000 mL | Freq: Once | INTRAVENOUS | Status: AC | PRN
  Administered 2021-11-12: 10 mL via INTRAVENOUS

## 2021-11-12 MED FILL — Dexamethasone Sodium Phosphate Inj 100 MG/10ML: INTRAMUSCULAR | Qty: 1 | Status: AC

## 2021-11-12 NOTE — Assessment & Plan Note (Addendum)
right breast upper outer quadrant biopsy 06/04/2021 for a clinical mT2 N0 invasive ductal carcinoma, grade 3, estrogen and progesterone receptor weakly positive, HER2 not amplified, with an MIB-1 of 70% ?CT CAP and Bone Scan: Neg ?? ?Treatment Plan: ?1. Neo adj chemo with AC X 4 followed by Taxol and Carboplatin weekly X 12 ?2. Breast Conserving surgery with SLN surgery ?3. Adjuvant XRT ?4. Adj Anti estrogen therapy ?------------------------------------------------------------------------------------------------------------------- ?Current Treatment: Completed?4?cycles of AC, Friday is?cycle?12?Taxol with Carboplatin ?Chemo Toxicities: ?1. Fatigue ?2. Chemo induced anemia: Hemoglobin was 10.8 ?3. Leukopenia: Significant improvement with the Granix injection.  He would like to skip next weeks of Granix injection and see if she can handle the rest of the treatments. ?4.  Right lower lid stye: I sent in a Cortisporin ophthalmic ointment for her to apply.   ? ?Arita has met with Dr. Donne Hazel and plans on skin sparing bilateral mastectomies.  This is scheduled 3/27.  She is going to talk to Dr. Iran Planas next week about her reconstruction.  Riata will see Dr. Lindi Adie on 4/4 for f/u.   ?

## 2021-11-13 ENCOUNTER — Other Ambulatory Visit: Payer: Self-pay

## 2021-11-13 ENCOUNTER — Inpatient Hospital Stay: Payer: No Typology Code available for payment source

## 2021-11-13 ENCOUNTER — Encounter: Payer: Self-pay | Admitting: *Deleted

## 2021-11-13 VITALS — BP 128/93 | HR 85 | Temp 98.1°F | Resp 18 | Wt 120.0 lb

## 2021-11-13 DIAGNOSIS — Z170 Estrogen receptor positive status [ER+]: Secondary | ICD-10-CM

## 2021-11-13 DIAGNOSIS — Z5111 Encounter for antineoplastic chemotherapy: Secondary | ICD-10-CM | POA: Diagnosis not present

## 2021-11-13 DIAGNOSIS — Z95828 Presence of other vascular implants and grafts: Secondary | ICD-10-CM

## 2021-11-13 LAB — CBC WITH DIFFERENTIAL (CANCER CENTER ONLY)
Abs Immature Granulocytes: 0.03 10*3/uL (ref 0.00–0.07)
Basophils Absolute: 0 10*3/uL (ref 0.0–0.1)
Basophils Relative: 0 %
Eosinophils Absolute: 0.1 10*3/uL (ref 0.0–0.5)
Eosinophils Relative: 1 %
HCT: 33 % — ABNORMAL LOW (ref 36.0–46.0)
Hemoglobin: 11.5 g/dL — ABNORMAL LOW (ref 12.0–15.0)
Immature Granulocytes: 0 %
Lymphocytes Relative: 13 %
Lymphs Abs: 1.1 10*3/uL (ref 0.7–4.0)
MCH: 34.1 pg — ABNORMAL HIGH (ref 26.0–34.0)
MCHC: 34.8 g/dL (ref 30.0–36.0)
MCV: 97.9 fL (ref 80.0–100.0)
Monocytes Absolute: 0.4 10*3/uL (ref 0.1–1.0)
Monocytes Relative: 5 %
Neutro Abs: 6.3 10*3/uL (ref 1.7–7.7)
Neutrophils Relative %: 81 %
Platelet Count: 170 10*3/uL (ref 150–400)
RBC: 3.37 MIL/uL — ABNORMAL LOW (ref 3.87–5.11)
RDW: 13.1 % (ref 11.5–15.5)
WBC Count: 7.8 10*3/uL (ref 4.0–10.5)
nRBC: 0 % (ref 0.0–0.2)

## 2021-11-13 LAB — CMP (CANCER CENTER ONLY)
ALT: 32 U/L (ref 0–44)
AST: 27 U/L (ref 15–41)
Albumin: 4.2 g/dL (ref 3.5–5.0)
Alkaline Phosphatase: 81 U/L (ref 38–126)
Anion gap: 4 — ABNORMAL LOW (ref 5–15)
BUN: 10 mg/dL (ref 6–20)
CO2: 28 mmol/L (ref 22–32)
Calcium: 9.5 mg/dL (ref 8.9–10.3)
Chloride: 108 mmol/L (ref 98–111)
Creatinine: 0.59 mg/dL (ref 0.44–1.00)
GFR, Estimated: >60 mL/min (ref >60)
Glucose, Bld: 105 mg/dL — ABNORMAL HIGH (ref 70–99)
Potassium: 3.9 mmol/L (ref 3.5–5.1)
Sodium: 140 mmol/L (ref 135–145)
Total Bilirubin: 0.4 mg/dL (ref 0.3–1.2)
Total Protein: 6.6 g/dL (ref 6.5–8.1)

## 2021-11-13 LAB — PREGNANCY, URINE: Preg Test, Ur: NEGATIVE

## 2021-11-13 MED ORDER — SODIUM CHLORIDE 0.9 % IV SOLN
10.0000 mg | Freq: Once | INTRAVENOUS | Status: AC
  Administered 2021-11-13: 10 mg via INTRAVENOUS
  Filled 2021-11-13: qty 10

## 2021-11-13 MED ORDER — HEPARIN SOD (PORK) LOCK FLUSH 100 UNIT/ML IV SOLN
500.0000 [IU] | Freq: Once | INTRAVENOUS | Status: AC | PRN
  Administered 2021-11-13: 500 [IU]

## 2021-11-13 MED ORDER — SODIUM CHLORIDE 0.9 % IV SOLN
45.0000 mg/m2 | Freq: Once | INTRAVENOUS | Status: AC
  Administered 2021-11-13: 72 mg via INTRAVENOUS
  Filled 2021-11-13: qty 12

## 2021-11-13 MED ORDER — SODIUM CHLORIDE 0.9% FLUSH
10.0000 mL | INTRAVENOUS | Status: DC | PRN
  Administered 2021-11-13: 10 mL

## 2021-11-13 MED ORDER — SODIUM CHLORIDE 0.9 % IV SOLN
110.0000 mg | Freq: Once | INTRAVENOUS | Status: AC
  Administered 2021-11-13: 110 mg via INTRAVENOUS
  Filled 2021-11-13: qty 11

## 2021-11-13 MED ORDER — SODIUM CHLORIDE 0.9% FLUSH
10.0000 mL | Freq: Once | INTRAVENOUS | Status: AC
  Administered 2021-11-13: 10 mL

## 2021-11-13 MED ORDER — FAMOTIDINE IN NACL 20-0.9 MG/50ML-% IV SOLN
20.0000 mg | Freq: Once | INTRAVENOUS | Status: AC
  Administered 2021-11-13: 20 mg via INTRAVENOUS
  Filled 2021-11-13: qty 50

## 2021-11-13 MED ORDER — SODIUM CHLORIDE 0.9 % IV SOLN: Freq: Once | INTRAVENOUS | Status: AC

## 2021-11-13 MED ORDER — DIPHENHYDRAMINE HCL 50 MG/ML IJ SOLN
25.0000 mg | Freq: Once | INTRAMUSCULAR | Status: AC
  Administered 2021-11-13: 25 mg via INTRAVENOUS
  Filled 2021-11-13: qty 1

## 2021-11-13 NOTE — Progress Notes (Signed)
Per Teldron RPH, filgastrim not due today.  ?

## 2021-11-13 NOTE — Patient Instructions (Signed)
[facility] MEDICAL ONCOLOGY  Discharge Instructions: Thank you for choosing [facility] to provide your oncology and hematology care.   If you have a lab appointment with the Cancer Center, please go directly to the Cancer Center and check in at the registration area.   Wear comfortable clothing and clothing appropriate for easy access to any Portacath or PICC line.   We strive to give you quality time with your provider. You may need to reschedule your appointment if you arrive late (15 or more minutes).  Arriving late affects you and other patients whose appointments are after yours.  Also, if you miss three or more appointments without notifying the office, you may be dismissed from the clinic at the provider's discretion.      For prescription refill requests, have your pharmacy contact our office and allow 72 hours for refills to be completed.    Today you received the following chemotherapy and/or immunotherapy agents : Paclitaxel,  Carboplatin.   To help prevent nausea and vomiting after your treatment, we encourage you to take your nausea medication as directed.  BELOW ARE SYMPTOMS THAT SHOULD BE REPORTED IMMEDIATELY: *FEVER GREATER THAN 100.4 F (38 C) OR HIGHER *CHILLS OR SWEATING *NAUSEA AND VOMITING THAT IS NOT CONTROLLED WITH YOUR NAUSEA MEDICATION *UNUSUAL SHORTNESS OF BREATH *UNUSUAL BRUISING OR BLEEDING *URINARY PROBLEMS (pain or burning when urinating, or frequent urination) *BOWEL PROBLEMS (unusual diarrhea, constipation, pain near the anus) TENDERNESS IN MOUTH AND THROAT WITH OR WITHOUT PRESENCE OF ULCERS (sore throat, sores in mouth, or a toothache) UNUSUAL RASH, SWELLING OR PAIN  UNUSUAL VAGINAL DISCHARGE OR ITCHING   Items with * indicate a potential emergency and should be followed up as soon as possible or go to the Emergency Department if any problems should occur.  Please show the CHEMOTHERAPY ALERT CARD or IMMUNOTHERAPY ALERT CARD  at check-in to the Emergency Department and triage nurse.  Should you have questions after your visit or need to cancel or reschedule your appointment, please contact [facility] MEDICAL [facility]COLOG[facility]  Dept: [phone]  and follow the prompts.  Office hours are 8:00 a.m. to 4:30 p.m. Monday - Friday. Please note that voicemails left after 4:00 p.m. may not be returned until the following business day.  We are closed weekends and major holidays. You have access to a nurse at all times for urgent questions. Please call the main number to the clinic Dept: [phone] and follow the prompts.   For any non-urgent questions, you may also contact your provider using MyChart. We now offer e-Visits for anyone 18 and older to request care online for non-urgent symptoms. For details visit [email or web address].   Also download the MyChart app! Go to the app store, search "MyChart", open the app, select , and log in with your MyChart username and password.  Due to Covid, a mask is required upon entering the hospital/clinic. If you do not have a mask, one will be given to you upon arrival. For doctor visits, patients may have 1 support person aged 18 or older with them. For treatment visits, patients cannot have anyone with them due to current Covid guidelines and our immunocompromised population.   

## 2021-11-17 ENCOUNTER — Encounter: Payer: Self-pay | Admitting: Gastroenterology

## 2021-11-18 ENCOUNTER — Other Ambulatory Visit: Payer: Self-pay | Admitting: Oncology

## 2021-11-18 ENCOUNTER — Inpatient Hospital Stay: Payer: No Typology Code available for payment source

## 2021-11-18 ENCOUNTER — Other Ambulatory Visit: Payer: Self-pay

## 2021-11-18 VITALS — BP 117/91 | HR 87 | Temp 98.1°F | Resp 16

## 2021-11-18 DIAGNOSIS — Z5111 Encounter for antineoplastic chemotherapy: Secondary | ICD-10-CM | POA: Diagnosis not present

## 2021-11-18 DIAGNOSIS — Z95828 Presence of other vascular implants and grafts: Secondary | ICD-10-CM

## 2021-11-18 MED ORDER — FILGRASTIM-AAFI 300 MCG/0.5ML IJ SOSY
300.0000 ug | PREFILLED_SYRINGE | Freq: Once | INTRAMUSCULAR | Status: AC
  Administered 2021-11-18: 300 ug via SUBCUTANEOUS
  Filled 2021-11-18: qty 0.5

## 2021-11-18 NOTE — Patient Instructions (Signed)
Filgrastim, G-CSF injection °What is this medication? °FILGRASTIM, G-CSF (fil GRA stim) is a granulocyte colony-stimulating factor that stimulates the growth of neutrophils, a type of white blood cell (WBC) important in the body's fight against infection. It is used to reduce the incidence of fever and infection in patients with certain types of cancer who are receiving chemotherapy that affects the bone marrow, to stimulate blood cell production for removal of WBCs from the body prior to a bone marrow transplantation, to reduce the incidence of fever and infection in patients who have severe chronic neutropenia, and to improve survival outcomes following high-dose radiation exposure that is toxic to the bone marrow. °This medicine may be used for other purposes; ask your health care provider or pharmacist if you have questions. °COMMON BRAND NAME(S): Neupogen, Nivestym, Releuko, Zarxio °What should I tell my care team before I take this medication? °They need to know if you have any of these conditions: °kidney disease °latex allergy °ongoing radiation therapy °sickle cell disease °an unusual or allergic reaction to filgrastim, pegfilgrastim, other medicines, foods, dyes, or preservatives °pregnant or trying to get pregnant °breast-feeding °How should I use this medication? °This medicine is for injection under the skin or infusion into a vein. As an infusion into a vein, it is usually given by a health care professional in a hospital or clinic setting. If you get this medicine at home, you will be taught how to prepare and give this medicine. Refer to the Instructions for Use that come with your medication packaging. Use exactly as directed. Take your medicine at regular intervals. Do not take your medicine more often than directed. °It is important that you put your used needles and syringes in a special sharps container. Do not put them in a trash can. If you do not have a sharps container, call your pharmacist  or healthcare provider to get one. °Talk to your pediatrician regarding the use of this medicine in children. While this drug may be prescribed for children as young as 7 months for selected conditions, precautions do apply. °Overdosage: If you think you have taken too much of this medicine contact a poison control center or emergency room at once. °NOTE: This medicine is only for you. Do not share this medicine with others. °What if I miss a dose? °It is important not to miss your dose. Call your doctor or health care professional if you miss a dose. °What may interact with this medication? °This medicine may interact with the following medications: °medicines that may cause a release of neutrophils, such as lithium °This list may not describe all possible interactions. Give your health care provider a list of all the medicines, herbs, non-prescription drugs, or dietary supplements you use. Also tell them if you smoke, drink alcohol, or use illegal drugs. Some items may interact with your medicine. °What should I watch for while using this medication? °Your condition will be monitored carefully while you are receiving this medicine. °You may need blood work done while you are taking this medicine. °Talk to your health care provider about your risk of cancer. You may be more at risk for certain types of cancer if you take this medicine. °What side effects may I notice from receiving this medication? °Side effects that you should report to your doctor or health care professional as soon as possible: °allergic reactions like skin rash, itching or hives, swelling of the face, lips, or tongue °back pain °dizziness or feeling faint °fever °pain, redness, or   irritation at site where injected °pinpoint red spots on the skin °shortness of breath or breathing problems °signs and symptoms of kidney injury like trouble passing urine, change in the amount of urine, or red or dark-brown urine °stomach or side pain, or pain at  the shoulder °swelling °tiredness °unusual bleeding or bruising °Side effects that usually do not require medical attention (report to your doctor or health care professional if they continue or are bothersome): °bone pain °cough °diarrhea °hair loss °headache °muscle pain °This list may not describe all possible side effects. Call your doctor for medical advice about side effects. You may report side effects to FDA at 1-800-FDA-1088. °Where should I keep my medication? °Keep out of the reach of children. °Store in a refrigerator between 2 and 8 degrees C (36 and 46 degrees F). Do not freeze. Keep in carton to protect from light. Throw away this medicine if vials or syringes are left out of the refrigerator for more than 24 hours. Throw away any unused medicine after the expiration date. °NOTE: This sheet is a summary. It may not cover all possible information. If you have questions about this medicine, talk to your doctor, pharmacist, or health care provider. °© 2022 [employer]vier/Gold Standard (2021-05-19 00:00:00) ° °

## 2021-11-19 ENCOUNTER — Encounter: Payer: Self-pay | Admitting: Adult Health

## 2021-11-19 NOTE — H&P (Signed)
Subjective:  ?  ? Patient ID: Ebony Lamb is a 46 y.o. female. ?  ?HPI ?  ?Returns for follow up discussion breast reconstruction.  ?  ?Presented with palpable right breast mass. Diagnostic MMG/US showed 3 cm mass in right breast 12 o'clock; possible 5 mm satellite lesion located 2.5 cm from dominant mass; abnormal right axillary LN. Biopsy breast mass showed IDC, ER 15% / PR 15%, both with weak-moderate staining intensity. HER2 equivocal by IHC, but negative by FISH. LN biopsy benign. ?  ?MRI demonstrated right breast mass 2.6 x 2.2 x 3.1 cm consistent with known malignancy. Suspected satellite nodule seen on [name] not well seen on MRI.  No abnormal appearing LN; biopsy concordant.  ?  ?Staging scans negative. Completed neoadjuvant chemotherapy. Final MRI showed near complete resolution right UOQ mass and 1.1 cm area of enhancement surrounding the biopsy clip. No enhancement of 2 satellite lesions noted on [name] previously. No right axillary adenopathy. ?  ?Mother, MGM with breast ca. Mother BRCA2 positive. Patient's genetic testing negative.  ?  ?Current B cup happy with this. ?  ?Patient has had prior abdominoplasty with Dr. Harlow Mares 2019. ?  ?Patient is an Forensic psychologist for [name]. Lives with spouse and 2 children. ?  ?Review of Systems ?  ?   ?Objective:  ? Physical Exam ?Cardiovascular:  ?   Rate and Rhythm: Normal rate and regular rhythm.  ?   Heart sounds: Normal heart sounds.  ?Pulmonary:  ?   Effort: Pulmonary effort is normal.  ?   Breath sounds: Normal breath sounds.  ?Abdominal:  ?   Comments: Low transverse scar  ?Lymphadenopathy:  ?   Upper Body:  ?   Right upper body: No axillary adenopathy.  ?   Left upper body: No axillary adenopathy.  ?Skin: ?   Comments: Fitzpatrick 2   ?  ?  ?Breasts:  ?No ptosis bilateral ?  ?SN to nipple R 20 L 20 cm ?BW R 15 L 15 cm CW 11 cm ?Nipple to IMF R 6 L 6 cm ?  ?   ?Assessment:  ?   ?Right breast cancer overlapping sites ER+ ?Neoadjuvant chemotherapy ?   ?Plan:  ?   ?Plan  bilateral NSM with immediate tissue expander acellular dermis reconstruction. ?  ?Patient has had multiple plastic surgery consultation including most recently at [facility]. Discussed staged reconstruction including SGAP and PAP flaps with Dr. Dorann Ou. Patient has not made decision regarding final reconstruction and considering these flaps. Immediate expander placement would allow for NSM and she could pursue implant vs autologous in future. From Dr. Dorann Ou note he would plan temporary expander placement regardless final reconstruction. Reviewed some women are bothered by permanent elevation of nipple post NSM.  ?  ?Reviewed reconstruction will be asensate and not stimulate. Reviewed risk mastectomy flap necrosis requiring additional surgery.  ?  ?Discussed use of acellular dermis in reconstruction, cadaveric source, incorporation over several weeks, risk that if has seroma or infection can act as additional nidus for infection if not incorporated. Reviewed this is off label use of ADM. Counseled ADM placement does not preclude future flap surgery. ?  ?Discussed prepectoral vs sub pectoral reconstruction. Discussed with patient and benefit of this is no animation deformity, may be less pain. Risk may be more visible rippling over upper poles, greater need of ADM. Reviewed pre pectoral would require larger amount acellular dermis, more drains. Discussed any type reconstruction also risks long term displacement implant and visible rippling. If  prepectoral counseled I would recommend she be comfortable with silicone implants as more options that have less rippling. She agrees to prepectoral placement. ?  ?Reviewed reconstruction will be asensate and not stimulate. Reviewed additional risks including but not limited to risks mastectomy flap necrosis requiring additional surgery, seroma, hematoma, asymmetry, need to additional procedures, fat necrosis, DVT/PE, damage to adjacent structures, cardiopulmonary complications. ?   ?Second to [employer] Rx provided. Drain teaching completed. Rx for oxycodone, Bactrim and Robaxin given. ? ?

## 2021-11-20 ENCOUNTER — Encounter: Payer: Self-pay | Admitting: Gastroenterology

## 2021-11-20 ENCOUNTER — Ambulatory Visit (AMBULATORY_SURGERY_CENTER): Payer: No Typology Code available for payment source | Admitting: Gastroenterology

## 2021-11-20 VITALS — BP 99/58 | HR 67 | Temp 96.0°F | Resp 12 | Ht 66.0 in | Wt 120.0 lb

## 2021-11-20 DIAGNOSIS — Z1211 Encounter for screening for malignant neoplasm of colon: Secondary | ICD-10-CM | POA: Diagnosis not present

## 2021-11-20 DIAGNOSIS — Z1212 Encounter for screening for malignant neoplasm of rectum: Secondary | ICD-10-CM

## 2021-11-20 DIAGNOSIS — K633 Ulcer of intestine: Secondary | ICD-10-CM

## 2021-11-20 DIAGNOSIS — K5900 Constipation, unspecified: Secondary | ICD-10-CM

## 2021-11-20 DIAGNOSIS — K50018 Crohn's disease of small intestine with other complication: Secondary | ICD-10-CM | POA: Diagnosis not present

## 2021-11-20 MED ORDER — SODIUM CHLORIDE 0.9 % IV SOLN: 500.0000 mL | Freq: Once | INTRAVENOUS | Status: DC

## 2021-11-20 NOTE — Patient Instructions (Signed)
Discharge instructions given. ?Handout on Hemorrhoids. ?Biopsies taken. ?Resume previous medications. ?YOU HAD AN ENDOSCOPIC PROCEDURE TODAY AT THE [facility]:   Refer to the procedure report that was given to you for any specific questions about what was found during the examination.  If the procedure report does not answer your questions, please call your gastroenterologist to clarify.  If you requested that your care partner not be given the details of your procedure findings, then the procedure report has been included in a sealed envelope for you to review at your convenience later. ? ?YOU SHOULD EXPECT: Some feelings of bloating in the abdomen. Passage of more gas than usual.  Walking can help get rid of the air that was put into your GI tract during the procedure and reduce the bloating. If you had a lower endoscopy (such as a colonoscopy or flexible sigmoidoscopy) you may notice spotting of blood in your stool or on the toilet paper. If you underwent a bowel prep for your procedure, you may not have a normal bowel movement for a few days. ? ?Please Note:  You might notice some irritation and congestion in your nose or some drainage.  This is from the oxygen used during your procedure.  There is no need for concern and it should clear up in a day or so. ? ?SYMPTOMS TO REPORT IMMEDIATELY: ? ?Following lower endoscopy (colonoscopy or flexible sigmoidoscopy): ? Excessive amounts of blood in the stool ? Significant tenderness or worsening of abdominal pains ? Swelling of the abdomen that is new, acute ? Fever of 100?F or higher ? ? ?For urgent or emergent issues, a gastroenterologist can be reached at any hour by calling [phone]. ?Do not use MyChart messaging for urgent concerns.  ? ? ?DIET:  We do recommend a small meal at first, but then you may proceed to your regular diet.  Drink plenty of fluids but you should avoid alcoholic beverages for 24 hours. ? ?ACTIVITY:  You should plan to take  it easy for the rest of today and you should NOT DRIVE or use heavy machinery until tomorrow (because of the sedation medicines used during the test).   ? ?FOLLOW UP: ?Our staff will call the number listed on your records 48-72 hours following your procedure to check on you and address any questions or concerns that you may have regarding the information given to you following your procedure. If we do not reach you, we will leave a message.  We will attempt to reach you two times.  During this call, we will ask if you have developed any symptoms of COVID 19. If you develop any symptoms (ie: fever, flu-like symptoms, shortness of breath, cough etc.) before then, please call [phone].  If you test positive for Covid 19 in the 2 weeks post procedure, please call and report this information to us.   ? ?If any biopsies were taken you will be contacted by phone or by letter within the next 1-3 weeks.  Please call us at [phone] if you have not heard about the biopsies in 3 weeks.  ? ? ?SIGNATURES/CONFIDENTIALITY: ?You and/or your care partner have signed paperwork which will be entered into your electronic medical record.  These signatures attest to the fact that that the information above on your After Visit Summary has been reviewed and is understood.  Full responsibility of the confidentiality of this discharge information lies with you and/or your care-partner.  ?

## 2021-11-20 NOTE — Progress Notes (Signed)
Pt's states no medical or surgical changes since previsit or office visit. VS assessed by D.T 

## 2021-11-20 NOTE — Progress Notes (Signed)
Called to room to assist during endoscopic procedure.  Patient ID and intended procedure confirmed with present staff. Received instructions for my participation in the procedure from the performing physician.  

## 2021-11-20 NOTE — Progress Notes (Signed)
[facility] History and Physical ? ? ?Primary Care Physician:  Antony Contras, MD ? ? ?Reason for Procedure:   Colon cancer screening ? ?Plan:    Screening colonoscopy ? ? ? ? ?HPI: Ebony Lamb is a 46 y.o. female undergoing initial average risk screening colonoscopy.  She had been having trouble with constipation associated with her chemotherapy and anti-emetic therapy (for breast cancer).  A CT scan in the setting of severe constipation showed a large rectal stool ball with adjacent inflammatory changes and focal short segment narrowing of the rectosigmoid colon.  Her constipation has resolved since she stopped chemotherapy and is no longer taking anti-emetics. ?No family history of colon cancer, but her mother has ulcerative colitis. ? ? ?Past Medical History:  ?Diagnosis Date  ? Abnormal Pap smear 09/13/2000  ? Allergy 02/2020  ? Seasonal  ? Breast cancer ([place])   ? Depression   ? history of depression- denies currently  ? H/O varicella   ? Hypertension 09/14/2011  ? post partum- meds x 1 mo  ? No pertinent past medical history   ? Preterm labor   ? ? ?Past Surgical History:  ?Procedure Laterality Date  ? BREAST BIOPSY Right 07/27/2019  ? CESAREAN SECTION  11/14/2011  ? Procedure: CESAREAN SECTION;  Surgeon: Eli Hose, MD;  Location: [facility];  Service: Gynecology;  Laterality: N/A;  Primary cesarean section with delivery of baby boy at [phone]. Apgars 9/9.  ? CESAREAN SECTION N/A 03/22/2014  ? Procedure: Repeat CESAREAN SECTION;  Surgeon: Alwyn Pea, MD;  Location: [facility];  Service: Obstetrics;  Laterality: N/A;  ? COSMETIC SURGERY  11/22/2017  ? Abdominoplasty  ? FOOT SURGERY    ? FRACTURE SURGERY  08/1999  ? Repair of 5th metatarsal in left foot  ? HERNIA REPAIR  11/22/2017  ? PORTACATH PLACEMENT N/A 06/24/2021  ? Procedure: INSERTION PORT-A-CATH;  Surgeon: Coralie Keens, MD;  Location: [facility];  Service: General;  Laterality: N/A;  ? TUBAL LIGATION  11/22/2017  ? Tubes  were completely removed  ? WISDOM TOOTH EXTRACTION    ? ? ?Prior to Admission medications   ?Medication Sig Start Date End Date Taking? Authorizing Provider  ?lidocaine-prilocaine (EMLA) cream Apply to affected area once 06/10/21  Yes Magrinat, Virgie Dad, MD  ?bacitracin-neomycin-polymyxin-hydrocortisone (CORTISPORIN) 1 % ophthalmic ointment Place 1 application into the right eye 4 (four) times daily. ?Patient not taking: Reported on 11/20/2021 11/11/21   Gardenia Phlegm, NP  ?loratadine (CLARITIN) 10 MG tablet Take 1 tablet (10 mg total) by mouth daily. Take day post chemo and then daily for 10 days with each treatment 06/23/21   Magrinat, Virgie Dad, MD  ?LORazepam (ATIVAN) 0.5 MG tablet Take 1 tablet (0.5 mg total) by mouth at bedtime as needed (Nausea or vomiting). 06/10/21   Magrinat, Virgie Dad, MD  ?metoCLOPramide (REGLAN) 5 MG tablet Take 1 tablet (5 mg total) by mouth every 6 (six) hours as needed for nausea. ?Patient not taking: Reported on 11/20/2021 09/01/21   Magrinat, Virgie Dad, MD  ?prochlorperazine (COMPAZINE) 10 MG tablet Take 1 tablet (10 mg total) by mouth every 6 (six) hours as needed (Nausea or vomiting). ?Patient not taking: Reported on 11/20/2021 06/10/21   Magrinat, Virgie Dad, MD  ?ferrous sulfate (FERROUSUL) 325 (65 FE) MG tablet Take 1 tablet (325 mg total) by mouth 2 (two) times daily. 11/17/11 11/26/11  Gypsy Lore, NP  ? ? ?Current Outpatient Medications  ?Medication Sig Dispense Refill  ? lidocaine-prilocaine (  EMLA) cream Apply to affected area once 30 g 3  ? bacitracin-neomycin-polymyxin-hydrocortisone (CORTISPORIN) 1 % ophthalmic ointment Place 1 application into the right eye 4 (four) times daily. (Patient not taking: Reported on 11/20/2021) 3.5 g 0  ? loratadine (CLARITIN) 10 MG tablet Take 1 tablet (10 mg total) by mouth daily. Take day post chemo and then daily for 10 days with each treatment 30 tablet 3  ? LORazepam (ATIVAN) 0.5 MG tablet Take 1 tablet (0.5 mg total) by mouth at  bedtime as needed (Nausea or vomiting). 30 tablet 0  ? metoCLOPramide (REGLAN) 5 MG tablet Take 1 tablet (5 mg total) by mouth every 6 (six) hours as needed for nausea. (Patient not taking: Reported on 11/20/2021) 90 tablet 1  ? prochlorperazine (COMPAZINE) 10 MG tablet Take 1 tablet (10 mg total) by mouth every 6 (six) hours as needed (Nausea or vomiting). (Patient not taking: Reported on 11/20/2021) 30 tablet 1  ? ?Current Facility-Administered Medications  ?Medication Dose Route Frequency Provider Last Rate Last Admin  ? 0.9 %  sodium chloride infusion  500 mL Intravenous Once Daryel November, MD      ? ?Facility-Administered Medications Ordered in Other Visits  ?Medication Dose Route Frequency Provider Last Rate Last Admin  ? influenza vac split quadrivalent PF (FLUARIX) 0.5 ML injection           ? influenza vac split quadrivalent PF (FLUARIX) injection 0.5 mL  0.5 mL Intramuscular Once Magrinat, Virgie Dad, MD      ? ? ?Allergies as of 11/20/2021 - Review Complete 11/20/2021  ?Allergen Reaction Noted  ? Cefepime Rash 09/24/2021  ? ? ?Family History  ?Problem Relation Age of Onset  ? Breast cancer Mother 33  ? Osteopenia Mother   ? Arthritis Mother   ? Other Mother   ?     BRCA2 gene mutation  ? Ulcerative colitis Mother   ? Hypertension Father   ? Kidney disease Father   ? Depression Father   ? Hyperlipidemia Father   ? Melanoma Father 55  ? Hypertension Brother   ? Hyperlipidemia Brother   ? Breast cancer Maternal Grandmother   ?     dx. late 30s/40s  ? Heart disease Maternal Grandfather   ? Other Maternal Grandfather   ?     BRCA2 gene mutation  ? Ovarian cancer Paternal Grandmother   ?     dx. 5s  ? Stroke Paternal Grandfather   ? Birth defects Son   ? Anesthesia problems Neg Hx   ? Diabetes Neg Hx   ? ? ?Social History  ? ?Socioeconomic History  ? Marital status: Married  ?  Spouse name: Not on file  ? Number of children: Not on file  ? Years of education: Not on file  ? Highest education level: Not on  file  ?Occupational History  ? Not on file  ?Tobacco Use  ? Smoking status: Never  ? Smokeless tobacco: Never  ?Vaping Use  ? Vaping Use: Never used  ?Substance and Sexual Activity  ? Alcohol use: Yes  ?  Alcohol/week: 7.0 standard drinks  ?  Types: 7 Standard drinks or equivalent per week  ?  Comment: I stopped drinking alcohol after finding the lump in my brea  ? Drug use: No  ? Sexual activity: Yes  ?  Birth control/protection: Surgical  ?Other Topics Concern  ? Not on file  ?Social History Narrative  ? Not on file  ? ?Social Determinants of Health  ? ?  Financial Resource Strain: Low Risk   ? Difficulty of Paying Living Expenses: Not very hard  ?Food Insecurity: No Food Insecurity  ? Worried About Charity fundraiser in the Last Year: Never true  ? Ran Out of Food in the Last Year: Never true  ?Transportation Needs: No Transportation Needs  ? Lack of Transportation (Medical): No  ? Lack of Transportation (Non-Medical): No  ?Physical Activity: Not on file  ?Stress: Not on file  ?Social Connections: Not on file  ?Intimate Partner Violence: Not on file  ? ? ?Review of Systems: ? ?All other review of systems negative except as mentioned in the HPI. ? ?Physical Exam: ?Vital signs ?BP 108/82   Pulse 69   Temp (!) 96 ?F (35.6 ?C) (Skin)   Ht '5\' 6"'  (1.676 m)   Wt 120 lb (54.4 kg)   SpO2 100%   BMI 19.37 kg/m?  ? ?General:   Alert,  Well-developed, well-nourished, pleasant and cooperative in NAD ?Airway:  Mallampati 1 ?Lungs:  Clear throughout to auscultation.   ?Heart:  Regular rate and rhythm; no murmurs, clicks, rubs,  or gallops. ?Abdomen:  Soft, nontender and nondistended. Normal bowel sounds.   ?Neuro/Psych:  Normal mood and affect. A and O x 3 ? ? ?Kathyjo Briere E. Candis Schatz, MD ?[facility] ? ?

## 2021-11-20 NOTE — Progress Notes (Signed)
Sedate, gd SR, tolerated procedure well, VSS, report to RN 

## 2021-11-20 NOTE — Op Note (Signed)
[facility] ?Patient Name: Ebony Lamb ?Procedure Date: 11/20/2021 11:03 AM ?MRN: [id number] ?Endoscopist: Lorijean Husser E. Candis Schatz , MD ?Age: 46 ?Referring MD:  ?Date of Birth: 1976/09/02 ?Gender: Female ?Account #: [id number] ?Procedure:                Colonoscopy ?Indications:              Screening for colorectal malignant neoplasm, This  ?                          is the patient's first colonoscopy ?Medicines:                Monitored Anesthesia Care ?Procedure:                Pre-Anesthesia Assessment: ?                          - Prior to the procedure, a History and Physical  ?                          was performed, and patient medications and  ?                          allergies were reviewed. The patient's tolerance of  ?                          previous anesthesia was also reviewed. The risks  ?                          and benefits of the procedure and the sedation  ?                          options and risks were discussed with the patient.  ?                          All questions were answered, and informed consent  ?                          was obtained. Prior Anticoagulants: The patient has  ?                          taken no previous anticoagulant or antiplatelet  ?                          agents. ASA Grade Assessment: II - A patient with  ?                          mild systemic disease. After reviewing the risks  ?                          and benefits, the patient was deemed in  ?                          satisfactory condition to undergo the procedure. ?  After obtaining informed consent, the colonoscope  ?                          was passed under direct vision. Throughout the  ?                          procedure, the patient's blood pressure, pulse, and  ?                          oxygen saturations were monitored continuously. The  ?                          Olympus PCF-H190DL (#[id number]) Colonoscope was  ?                          introduced through the anus  and advanced to the the  ?                          terminal ileum, with identification of the  ?                          appendiceal orifice and IC valve. The colonoscopy  ?                          was performed without difficulty. The patient  ?                          tolerated the procedure well. The quality of the  ?                          bowel preparation was excellent. The terminal  ?                          ileum, ileocecal valve, appendiceal orifice, and  ?                          rectum were photographed. The bowel preparation  ?                          used was SUPREP via split dose instruction. ?Scope In: 11:23:20 AM ?Scope Out: 11:42:39 AM ?Scope Withdrawal Time: 0 hours 10 minutes 11 seconds  ?Total Procedure Duration: 0 hours 19 minutes 19 seconds  ?Findings:                 Hemorrhoids were found on perianal exam. ?                          The digital rectal exam was normal. Pertinent  ?                          negatives include normal sphincter tone and no  ?                          palpable rectal lesions. ?  The colon (entire examined portion) appeared normal. ?                          The terminal ileum contained a few ulcers. No  ?                          bleeding was present. No stigmata of recent  ?                          bleeding were seen. Biopsies were taken with a cold  ?                          forceps for histology. Estimated blood loss was  ?                          minimal. ?                          The remainder of the exam in the terminal ileum was  ?                          normal. ?                          The retroflexed view of the distal rectum and anal  ?                          verge was normal and showed no anal or rectal  ?                          abnormalities. ?Complications:            No immediate complications. ?Estimated Blood Loss:     Estimated blood loss was minimal. ?Impression:               - Hemorrhoids found on  perianal exam. ?                          - The entire examined colon is normal. ?                          - A few ulcers in the terminal ileum. Biopsied. ?                          - The distal rectum and anal verge are normal on  ?                          retroflexion view. ?Recommendation:           - Patient has a contact number available for  ?                          emergencies. The signs and symptoms of potential  ?                          delayed complications  were discussed with the  ?                          patient. Return to normal activities tomorrow.  ?                          Written discharge instructions were provided to the  ?                          patient. ?                          - Resume previous diet. ?                          - Continue present medications. ?                          - Await pathology results. Further recommendations  ?                          will be based on pathology results. ?                          - Repeat colonoscopy in 10 years for screening  ?                          purposes. ?Neriyah Cercone E. Candis Schatz, MD ?11/20/2021 11:47:43 AM ?This report has been signed electronically. ?

## 2021-11-24 ENCOUNTER — Telehealth: Payer: Self-pay

## 2021-11-24 NOTE — Telephone Encounter (Signed)
?  Follow up Call- ? ?Call back number 11/20/2021  ?Post procedure Call Back phone  # [phone]  ?Permission to leave phone message Yes  ?Some recent data might be hidden  ?  ? ?Patient questions: ? ?Do you have a fever, pain , or abdominal swelling? No. ?Pain Score  0 * ? ?Have you tolerated food without any problems? Yes.   ? ?Have you been able to return to your normal activities? Yes.   ? ?Do you have any questions about your discharge instructions: ?Diet   No. ?Medications  No. ?Follow up visit  No. ? ?Do you have questions or concerns about your Care? No. ? ?Actions: ?* If pain score is 4 or above: ?No action needed, pain <4. ? ?Have you developed a fever since your procedure? no ? ?2.   Have you had an respiratory symptoms (SOB or cough) since your procedure? no ? ?3.   Have you tested positive for COVID 19 since your procedure no ? ?4.   Have you had any family members/close contacts diagnosed with the COVID 19 since your procedure?  no ? ? ?If yes to any of these questions please route to Joylene John, RN and Joella Prince, RN ? ?

## 2021-11-26 ENCOUNTER — Encounter (HOSPITAL_BASED_OUTPATIENT_CLINIC_OR_DEPARTMENT_OTHER): Payer: Self-pay | Admitting: General Surgery

## 2021-11-26 ENCOUNTER — Other Ambulatory Visit: Payer: Self-pay

## 2021-11-29 NOTE — Progress Notes (Signed)
Lael,  ?The biopsies of the ulcers in the ileum did not show any features to suggest Crohn's disease.  I think the ulcers are most likely related to your chemotherapy.  Should you develop any new persistent GI symptoms such as abdominal pain, diarrhea or blood in stool, I would recommend repeating a colonoscopy to assess for persistence of the ulcers.   ?Otherwise, I would recommend you repeat colonoscopy in 10 years for colon cancer screening. ?Please contact me with any further questions you may have. ?

## 2021-11-30 ENCOUNTER — Encounter: Payer: Self-pay | Admitting: Hematology and Oncology

## 2021-12-04 NOTE — Progress Notes (Signed)
Pt given ensure presurgery drink with instruction to complete by 0430 DOS. Also given CHG soap. Instructions taped to both bottles and pt verbalized understanding of both. ? ? ? ? ?Enhanced Recovery after Surgery for Orthopedics ?Enhanced Recovery after Surgery is a protocol used to improve the stress on your body and your recovery after surgery. ? ?Patient Instructions ? ?The night before surgery:  ?No food after midnight. ONLY clear liquids after midnight ? ?The day of surgery (if you do NOT have diabetes):  ?Drink ONE (1) Pre-Surgery Clear Ensure as directed.   ?This drink was given to you during your hospital  ?pre-op appointment visit. ?The pre-op nurse will instruct you on the time to drink the  ?Pre-Surgery Ensure depending on your surgery time. ?Finish the drink at the designated time by the pre-op nurse.  ?Nothing else to drink after completing the  ?Pre-Surgery Clear Ensure. ? ?The day of surgery (if you have diabetes): ?Drink ONE (1) Gatorade 2 (G2) as directed. ?This drink was given to you during your hospital  ?pre-op appointment visit.  ?The pre-op nurse will instruct you on the time to drink the  ? Gatorade 2 (G2) depending on your surgery time. ?Color of the Gatorade may vary. Red is not allowed. ?Nothing else to drink after completing the  ?Gatorade 2 (G2). ? ?       If you have questions, please contact your surgeon?s office. ?

## 2021-12-06 NOTE — Anesthesia Preprocedure Evaluation (Addendum)
Anesthesia Evaluation  ?Patient identified by MRN, date of birth, ID band ?Patient awake ? ? ? ?Reviewed: ?Allergy & Precautions, NPO status , Patient's Chart, lab work & pertinent test results ? ?History of Anesthesia Complications ?Negative for: history of anesthetic complications ? ?Airway ?Mallampati: I ? ?TM Distance: >3 FB ?Neck ROM: Full ? ? ? Dental ? ?(+) Teeth Intact, Dental Advisory Given ?  ?Pulmonary ?neg pulmonary ROS,  ?  ?Pulmonary exam normal ? ? ? ? ? ? ? Cardiovascular ?hypertension, Normal cardiovascular exam ? ? ?Normal echo 06/22/21 ?  ?Neuro/Psych ?Depression negative neurological ROS ?   ? GI/Hepatic ?negative GI ROS, Neg liver ROS,   ?Endo/Other  ?negative endocrine ROS ? Renal/GU ?negative Renal ROS  ?negative genitourinary ?  ?Musculoskeletal ?negative musculoskeletal ROS ?(+)  ? Abdominal ?  ?Peds ? Hematology ?negative hematology ROS ?(+)   ?Anesthesia Other Findings ? ?Breast cancer ? Reproductive/Obstetrics ? ?  ? ? ? ? ? ? ? ? ? ? ? ? ? ?  ?  ? ? ? ? ? ? ? ?Anesthesia Physical ?Anesthesia Plan ? ?ASA: 2 ? ?Anesthesia Plan: General  ? ?Post-op Pain Management: Regional block*, Tylenol PO (pre-op)* and Toradol IV (intra-op)*  ? ?Induction: Intravenous ? ?PONV Risk Score and Plan: 3 and Ondansetron, Dexamethasone, Treatment may vary due to age or medical condition, Midazolam and Propofol infusion ? ?Airway Management Planned: Oral ETT ? ?Additional Equipment: None ? ?Intra-op Plan:  ? ?Post-operative Plan: Extubation in OR ? ?Informed Consent: I have reviewed the patients History and Physical, chart, labs and discussed the procedure including the risks, benefits and alternatives for the proposed anesthesia with the patient or authorized representative who has indicated his/her understanding and acceptance.  ? ? ? ?Dental advisory given ? ?Plan Discussed with:  ? ?Anesthesia Plan Comments:   ? ? ? ? ? ?Anesthesia Quick Evaluation ? ?

## 2021-12-07 ENCOUNTER — Other Ambulatory Visit: Payer: Self-pay

## 2021-12-07 ENCOUNTER — Encounter (HOSPITAL_BASED_OUTPATIENT_CLINIC_OR_DEPARTMENT_OTHER): Payer: Self-pay | Admitting: General Surgery

## 2021-12-07 ENCOUNTER — Encounter (HOSPITAL_BASED_OUTPATIENT_CLINIC_OR_DEPARTMENT_OTHER)
Admission: RE | Disposition: A | Discharge status: Home / Self Care | Payer: Self-pay | Source: Home / Self Care | Attending: General Surgery

## 2021-12-07 ENCOUNTER — Ambulatory Visit (HOSPITAL_BASED_OUTPATIENT_CLINIC_OR_DEPARTMENT_OTHER): Payer: No Typology Code available for payment source | Admitting: Anesthesiology

## 2021-12-07 ENCOUNTER — Observation Stay (HOSPITAL_BASED_OUTPATIENT_CLINIC_OR_DEPARTMENT_OTHER)
Admission: RE | Admit: 2021-12-07 | Discharge: 2021-12-08 | Disposition: A | Discharge status: Home / Self Care | Payer: No Typology Code available for payment source | Attending: General Surgery | Admitting: General Surgery

## 2021-12-07 DIAGNOSIS — C50811 Malignant neoplasm of overlapping sites of right female breast: Secondary | ICD-10-CM | POA: Diagnosis not present

## 2021-12-07 DIAGNOSIS — C50911 Malignant neoplasm of unspecified site of right female breast: Secondary | ICD-10-CM | POA: Diagnosis present

## 2021-12-07 DIAGNOSIS — N6031 Fibrosclerosis of right breast: Secondary | ICD-10-CM | POA: Diagnosis not present

## 2021-12-07 DIAGNOSIS — Z170 Estrogen receptor positive status [ER+]: Secondary | ICD-10-CM

## 2021-12-07 DIAGNOSIS — N6022 Fibroadenosis of left breast: Secondary | ICD-10-CM | POA: Insufficient documentation

## 2021-12-07 DIAGNOSIS — Z853 Personal history of malignant neoplasm of breast: Secondary | ICD-10-CM | POA: Diagnosis not present

## 2021-12-07 DIAGNOSIS — N6032 Fibrosclerosis of left breast: Secondary | ICD-10-CM | POA: Insufficient documentation

## 2021-12-07 DIAGNOSIS — Z421 Encounter for breast reconstruction following mastectomy: Secondary | ICD-10-CM

## 2021-12-07 HISTORY — PX: BREAST RECONSTRUCTION WITH PLACEMENT OF TISSUE EXPANDER AND ALLODERM: SHX6805

## 2021-12-07 HISTORY — PX: NIPPLE SPARING MASTECTOMY WITH SENTINEL LYMPH NODE BIOPSY: SHX6826

## 2021-12-07 HISTORY — PX: NIPPLE SPARING MASTECTOMY: SHX6537

## 2021-12-07 LAB — POCT PREGNANCY, URINE: Preg Test, Ur: NEGATIVE

## 2021-12-07 SURGERY — NIPPLE SPARING MASTECTOMY WITH SENTINEL LYMPH NODE BIOPSY
Anesthesia: General | Site: Breast | Laterality: Right

## 2021-12-07 MED ORDER — PROPOFOL 10 MG/ML IV BOLUS
INTRAVENOUS | Status: AC
  Filled 2021-12-07: qty 20

## 2021-12-07 MED ORDER — METHOCARBAMOL 500 MG PO TABS
500.0000 mg | ORAL_TABLET | Freq: Four times a day (QID) | ORAL | Status: DC | PRN
  Administered 2021-12-07 – 2021-12-08 (×3): 500 mg via ORAL
  Filled 2021-12-07 (×3): qty 1

## 2021-12-07 MED ORDER — LACTATED RINGERS IV SOLN: INTRAVENOUS | Status: DC

## 2021-12-07 MED ORDER — EPHEDRINE SULFATE (PRESSORS) 50 MG/ML IJ SOLN
INTRAMUSCULAR | Status: DC | PRN
  Administered 2021-12-07: 5 mg via INTRAVENOUS
  Administered 2021-12-07: 10 mg via INTRAVENOUS
  Administered 2021-12-07 (×3): 5 mg via INTRAVENOUS
  Administered 2021-12-07: 10 mg via INTRAVENOUS
  Administered 2021-12-07 (×2): 5 mg via INTRAVENOUS

## 2021-12-07 MED ORDER — MORPHINE SULFATE (PF) 4 MG/ML IV SOLN: 1.0000 mg | INTRAVENOUS | Status: DC | PRN

## 2021-12-07 MED ORDER — CHLORHEXIDINE GLUCONATE CLOTH 2 % EX PADS: 6.0000 | MEDICATED_PAD | Freq: Once | CUTANEOUS | Status: DC

## 2021-12-07 MED ORDER — LIDOCAINE 2% (20 MG/ML) 5 ML SYRINGE
INTRAMUSCULAR | Status: AC
  Filled 2021-12-07: qty 5

## 2021-12-07 MED ORDER — POVIDONE-IODINE 10 % EX SOLN
CUTANEOUS | Status: DC | PRN
  Administered 2021-12-07: 1 via TOPICAL

## 2021-12-07 MED ORDER — SODIUM CHLORIDE 0.9 % IV SOLN: INTRAVENOUS | Status: DC

## 2021-12-07 MED ORDER — ACETAMINOPHEN 500 MG PO TABS
ORAL_TABLET | ORAL | Status: AC
  Filled 2021-12-07: qty 2

## 2021-12-07 MED ORDER — ONDANSETRON HCL 4 MG/2ML IJ SOLN
INTRAMUSCULAR | Status: AC
  Filled 2021-12-07: qty 2

## 2021-12-07 MED ORDER — SODIUM CHLORIDE 0.9 % IV SOLN
INTRAVENOUS | Status: AC
  Filled 2021-12-07: qty 10

## 2021-12-07 MED ORDER — ROCURONIUM BROMIDE 100 MG/10ML IV SOLN
INTRAVENOUS | Status: DC | PRN
  Administered 2021-12-07: 20 mg via INTRAVENOUS
  Administered 2021-12-07: 60 mg via INTRAVENOUS

## 2021-12-07 MED ORDER — OXYCODONE HCL 5 MG PO TABS: 5.0000 mg | ORAL_TABLET | Freq: Once | ORAL | Status: DC | PRN

## 2021-12-07 MED ORDER — FENTANYL CITRATE (PF) 100 MCG/2ML IJ SOLN
INTRAMUSCULAR | Status: AC
  Filled 2021-12-07: qty 2

## 2021-12-07 MED ORDER — SUGAMMADEX SODIUM 200 MG/2ML IV SOLN
INTRAVENOUS | Status: DC | PRN
  Administered 2021-12-07: 125 mg via INTRAVENOUS

## 2021-12-07 MED ORDER — SIMETHICONE 80 MG PO CHEW: 40.0000 mg | CHEWABLE_TABLET | Freq: Four times a day (QID) | ORAL | Status: DC | PRN

## 2021-12-07 MED ORDER — ENSURE PRE-SURGERY PO LIQD: 296.0000 mL | Freq: Once | ORAL | Status: DC

## 2021-12-07 MED ORDER — CELECOXIB 200 MG PO CAPS
400.0000 mg | ORAL_CAPSULE | ORAL | Status: AC
  Administered 2021-12-07: 400 mg via ORAL

## 2021-12-07 MED ORDER — OXYCODONE HCL 5 MG/5ML PO SOLN: 5.0000 mg | Freq: Once | ORAL | Status: DC | PRN

## 2021-12-07 MED ORDER — MAGTRACE LYMPHATIC TRACER
INTRAMUSCULAR | Status: DC | PRN
  Administered 2021-12-07: 2 mL via INTRAMUSCULAR

## 2021-12-07 MED ORDER — BUPIVACAINE HCL (PF) 0.25 % IJ SOLN
INTRAMUSCULAR | Status: DC | PRN
  Administered 2021-12-07 (×2): 25 mL

## 2021-12-07 MED ORDER — ACETAMINOPHEN 500 MG PO TABS: 1000.0000 mg | ORAL_TABLET | ORAL | Status: AC

## 2021-12-07 MED ORDER — MIDAZOLAM HCL 2 MG/2ML IJ SOLN
INTRAMUSCULAR | Status: AC
  Filled 2021-12-07: qty 2

## 2021-12-07 MED ORDER — CIPROFLOXACIN IN D5W 400 MG/200ML IV SOLN
INTRAVENOUS | Status: AC
Start: 2021-12-07 — End: ?
  Filled 2021-12-07: qty 200

## 2021-12-07 MED ORDER — CELECOXIB 200 MG PO CAPS
ORAL_CAPSULE | ORAL | Status: AC
  Filled 2021-12-07: qty 2

## 2021-12-07 MED ORDER — ONDANSETRON 4 MG PO TBDP: 4.0000 mg | ORAL_TABLET | Freq: Four times a day (QID) | ORAL | Status: DC | PRN

## 2021-12-07 MED ORDER — FENTANYL CITRATE (PF) 100 MCG/2ML IJ SOLN
INTRAMUSCULAR | Status: DC | PRN
  Administered 2021-12-07 (×2): 50 ug via INTRAVENOUS

## 2021-12-07 MED ORDER — CIPROFLOXACIN IN D5W 400 MG/200ML IV SOLN
400.0000 mg | INTRAVENOUS | Status: AC
  Administered 2021-12-07: 400 mg via INTRAVENOUS

## 2021-12-07 MED ORDER — AMISULPRIDE (ANTIEMETIC) 5 MG/2ML IV SOLN: 10.0000 mg | Freq: Once | INTRAVENOUS | Status: DC | PRN

## 2021-12-07 MED ORDER — LIDOCAINE HCL (CARDIAC) PF 100 MG/5ML IV SOSY
PREFILLED_SYRINGE | INTRAVENOUS | Status: DC | PRN
  Administered 2021-12-07: 20 mg via INTRAVENOUS

## 2021-12-07 MED ORDER — ONDANSETRON HCL 4 MG/2ML IJ SOLN: 4.0000 mg | Freq: Once | INTRAMUSCULAR | Status: DC | PRN

## 2021-12-07 MED ORDER — EPHEDRINE 5 MG/ML INJ
INTRAVENOUS | Status: AC
  Filled 2021-12-07: qty 5

## 2021-12-07 MED ORDER — PROPOFOL 500 MG/50ML IV EMUL
INTRAVENOUS | Status: AC
  Filled 2021-12-07: qty 50

## 2021-12-07 MED ORDER — ACETAMINOPHEN 500 MG PO TABS
1000.0000 mg | ORAL_TABLET | Freq: Four times a day (QID) | ORAL | Status: DC
  Administered 2021-12-07 – 2021-12-08 (×4): 1000 mg via ORAL
  Filled 2021-12-07 (×3): qty 2

## 2021-12-07 MED ORDER — FENTANYL CITRATE (PF) 100 MCG/2ML IJ SOLN
100.0000 ug | Freq: Once | INTRAMUSCULAR | Status: AC
  Administered 2021-12-07: 50 ug via INTRAVENOUS

## 2021-12-07 MED ORDER — CIPROFLOXACIN IN D5W 400 MG/200ML IV SOLN
400.0000 mg | Freq: Two times a day (BID) | INTRAVENOUS | Status: AC
  Administered 2021-12-07: 400 mg via INTRAVENOUS
  Filled 2021-12-07: qty 200

## 2021-12-07 MED ORDER — OXYCODONE HCL 5 MG PO TABS
5.0000 mg | ORAL_TABLET | ORAL | Status: DC | PRN
  Administered 2021-12-07 – 2021-12-08 (×4): 5 mg via ORAL
  Filled 2021-12-07 (×4): qty 1

## 2021-12-07 MED ORDER — PHENYLEPHRINE 40 MCG/ML (10ML) SYRINGE FOR IV PUSH (FOR BLOOD PRESSURE SUPPORT)
PREFILLED_SYRINGE | INTRAVENOUS | Status: AC
  Filled 2021-12-07: qty 10

## 2021-12-07 MED ORDER — ONDANSETRON HCL 4 MG/2ML IJ SOLN: 4.0000 mg | Freq: Four times a day (QID) | INTRAMUSCULAR | Status: DC | PRN

## 2021-12-07 MED ORDER — MIDAZOLAM HCL 2 MG/2ML IJ SOLN
2.0000 mg | Freq: Once | INTRAMUSCULAR | Status: AC
  Administered 2021-12-07: 2 mg via INTRAVENOUS

## 2021-12-07 MED ORDER — SODIUM CHLORIDE 0.9 % IV SOLN
INTRAVENOUS | Status: DC | PRN
  Administered 2021-12-07: 500 mL

## 2021-12-07 MED ORDER — DEXAMETHASONE SODIUM PHOSPHATE 10 MG/ML IJ SOLN
INTRAMUSCULAR | Status: AC
  Filled 2021-12-07: qty 1

## 2021-12-07 MED ORDER — ROCURONIUM BROMIDE 10 MG/ML (PF) SYRINGE
PREFILLED_SYRINGE | INTRAVENOUS | Status: AC
  Filled 2021-12-07: qty 10

## 2021-12-07 MED ORDER — FENTANYL CITRATE (PF) 100 MCG/2ML IJ SOLN
25.0000 ug | INTRAMUSCULAR | Status: DC | PRN
  Administered 2021-12-07 (×2): 25 ug via INTRAVENOUS

## 2021-12-07 MED ORDER — DEXAMETHASONE SODIUM PHOSPHATE 4 MG/ML IJ SOLN
INTRAMUSCULAR | Status: DC | PRN
Start: 2021-12-07 — End: 2021-12-07
  Administered 2021-12-07: 10 mg via INTRAVENOUS

## 2021-12-07 MED ORDER — PROPOFOL 10 MG/ML IV BOLUS
INTRAVENOUS | Status: DC | PRN
  Administered 2021-12-07: 130 mg via INTRAVENOUS

## 2021-12-07 MED ORDER — PROPOFOL 500 MG/50ML IV EMUL
INTRAVENOUS | Status: DC | PRN
  Administered 2021-12-07: 25 ug/kg/min via INTRAVENOUS

## 2021-12-07 MED ORDER — ACETAMINOPHEN 500 MG PO TABS
1000.0000 mg | ORAL_TABLET | Freq: Once | ORAL | Status: AC
  Administered 2021-12-07: 1000 mg via ORAL

## 2021-12-07 SURGICAL SUPPLY — 76 items
ALLOGRAFT PERF 16X20 1.6+/-0.4 (Tissue) ×2 IMPLANT
APPLIER CLIP 11 MED OPEN (CLIP) ×4
BAG DECANTER FOR FLEXI CONT (MISCELLANEOUS) ×4 IMPLANT
BINDER BREAST LRG (GAUZE/BANDAGES/DRESSINGS) ×1 IMPLANT
BINDER BREAST MEDIUM (GAUZE/BANDAGES/DRESSINGS) IMPLANT
BLADE SURG 10 STRL SS (BLADE) ×5 IMPLANT
BLADE SURG 15 STRL LF DISP TIS (BLADE) ×3 IMPLANT
BLADE SURG 15 STRL SS (BLADE) ×1
BNDG GAUZE ELAST 4 BULKY (GAUZE/BANDAGES/DRESSINGS) ×2 IMPLANT
CANISTER SUCT 1200ML W/VALVE (MISCELLANEOUS) ×4 IMPLANT
CHLORAPREP W/TINT 26 (MISCELLANEOUS) ×5 IMPLANT
CLIP APPLIE 11 MED OPEN (CLIP) ×3 IMPLANT
COVER BACK TABLE 60X90IN (DRAPES) ×4 IMPLANT
COVER MAYO STAND STRL (DRAPES) ×5 IMPLANT
COVER PROBE W GEL 5X96 (DRAPES) ×4 IMPLANT
DERMABOND ADVANCED (GAUZE/BANDAGES/DRESSINGS) ×2
DERMABOND ADVANCED .7 DNX12 (GAUZE/BANDAGES/DRESSINGS) ×6 IMPLANT
DRAIN CHANNEL 15F RND FF W/TCR (WOUND CARE) ×2 IMPLANT
DRAIN CHANNEL 19F RND (DRAIN) ×2 IMPLANT
DRAPE INCISE IOBAN 66X45 STRL (DRAPES) ×1 IMPLANT
DRAPE TOP ARMCOVERS (MISCELLANEOUS) ×4 IMPLANT
DRAPE U-SHAPE 76X120 STRL (DRAPES) ×5 IMPLANT
DRAPE UTILITY XL STRL (DRAPES) ×5 IMPLANT
DRSG PAD ABDOMINAL 8X10 ST (GAUZE/BANDAGES/DRESSINGS) ×9 IMPLANT
DRSG TEGADERM 4X10 (GAUZE/BANDAGES/DRESSINGS) ×3 IMPLANT
ELECT BLADE 4.0 EZ CLEAN MEGAD (MISCELLANEOUS) ×4
ELECT COATED BLADE 2.86 ST (ELECTRODE) ×4 IMPLANT
ELECT REM PT RETURN 9FT ADLT (ELECTROSURGICAL) ×4
ELECTRODE BLDE 4.0 EZ CLN MEGD (MISCELLANEOUS) ×3 IMPLANT
ELECTRODE REM PT RTRN 9FT ADLT (ELECTROSURGICAL) ×3 IMPLANT
EVACUATOR SILICONE 100CC (DRAIN) ×7 IMPLANT
EXPANDER TISSUE FORTE 300CC (Breast) IMPLANT
GAUZE SPONGE 4X4 12PLY STRL (GAUZE/BANDAGES/DRESSINGS) ×3 IMPLANT
GLOVE SURG ENC MOIS LTX SZ6 (GLOVE) IMPLANT
GLOVE SURG ENC MOIS LTX SZ7 (GLOVE) ×4 IMPLANT
GLOVE SURG HYDRASOFT LTX SZ5.5 (GLOVE) ×5 IMPLANT
GLOVE SURG POLYISO LF SZ6.5 (GLOVE) ×2 IMPLANT
GLOVE SURG UNDER POLY LF SZ7 (GLOVE) ×2 IMPLANT
GLOVE SURG UNDER POLY LF SZ7.5 (GLOVE) ×4 IMPLANT
GOWN STRL REUS W/ TWL LRG LVL3 (GOWN DISPOSABLE) ×9 IMPLANT
GOWN STRL REUS W/TWL LRG LVL3 (GOWN DISPOSABLE) ×4
KIT FILL SYSTEM UNIVERSAL (SET/KITS/TRAYS/PACK) ×4 IMPLANT
LIGHT WAVEGUIDE WIDE FLAT (MISCELLANEOUS) ×4 IMPLANT
NDL SAFETY ECLIPSE 18X1.5 (NEEDLE) ×3 IMPLANT
NEEDLE HYPO 18GX1.5 SHARP (NEEDLE) ×1
NS IRRIG 1000ML POUR BTL (IV SOLUTION) ×4 IMPLANT
PACK BASIN DAY SURGERY FS (CUSTOM PROCEDURE TRAY) ×4 IMPLANT
PENCIL SMOKE EVACUATOR (MISCELLANEOUS) ×4 IMPLANT
PIN SAFETY STERILE (MISCELLANEOUS) ×4 IMPLANT
SHEET MEDIUM DRAPE 40X70 STRL (DRAPES) ×5 IMPLANT
SLEEVE SCD COMPRESS KNEE MED (STOCKING) ×4 IMPLANT
SPONGE T-LAP 18X18 ~~LOC~~+RFID (SPONGE) ×9 IMPLANT
STAPLER VISISTAT 35W (STAPLE) ×4 IMPLANT
SUT CHROMIC 4 0 PS 2 18 (SUTURE) ×5 IMPLANT
SUT ETHILON 2 0 FS 18 (SUTURE) ×5 IMPLANT
SUT MNCRL AB 4-0 PS2 18 (SUTURE) ×6 IMPLANT
SUT MON AB 5-0 PS2 18 (SUTURE) ×1 IMPLANT
SUT SILK 2 0 SH (SUTURE) ×1 IMPLANT
SUT VIC AB 2-0 SH 27 (SUTURE) ×1
SUT VIC AB 2-0 SH 27XBRD (SUTURE) ×6 IMPLANT
SUT VIC AB 3-0 SH 27 (SUTURE) ×2
SUT VIC AB 3-0 SH 27X BRD (SUTURE) IMPLANT
SUT VICRYL 0 CT-2 (SUTURE) ×3 IMPLANT
SUT VICRYL 3-0 CR8 SH (SUTURE) ×4 IMPLANT
SUT VICRYL 4-0 PS2 18IN ABS (SUTURE) ×2 IMPLANT
SUT VLOC 180 0 24IN GS25 (SUTURE) ×2 IMPLANT
SYR 50ML LL SCALE MARK (SYRINGE) ×4 IMPLANT
SYR BULB IRRIG 60ML STRL (SYRINGE) ×7 IMPLANT
TISSUE EXPANDER FORTE 300CC (Breast) ×8 IMPLANT
TOWEL GREEN STERILE FF (TOWEL DISPOSABLE) ×8 IMPLANT
TRACER MAGTRACE VIAL (MISCELLANEOUS) ×1 IMPLANT
TRAY DSU PREP LF (CUSTOM PROCEDURE TRAY) ×1 IMPLANT
TRAY FOLEY W/BAG SLVR 14FR LF (SET/KITS/TRAYS/PACK) ×1 IMPLANT
TUBE CONNECTING 20X1/4 (TUBING) ×4 IMPLANT
UNDERPAD 30X36 HEAVY ABSORB (UNDERPADS AND DIAPERS) ×8 IMPLANT
YANKAUER SUCT BULB TIP NO VENT (SUCTIONS) ×4 IMPLANT

## 2021-12-07 NOTE — H&P (Signed)
?46 year old female who has a significant family history in her mom of breast cancer at age 61 who is dad diagnosed BRCA2 mutation as well as a maternal grandmother who was diagnosed in her 42s and a paternal grandmother with ovarian cancer.  She has no real prior breast history or self.  She is otherwise very healthy.   She had a palpable mass noted.  This was then evaluated.  She had mammogram and ultrasound which showed a 3 cm mass in but possible 5 mm satellite mass right next to it.  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  Her staging scans show that she does not appear to have any metastatic disease.  Her BRCA2 mutation was negative years ago when she already has her breast cancer portion of the panel back now that is negative.   ?She had an MRI that shows a negative left breast, no abnormal lymph nodes as well as a 3.1 cm mass in the upper inner quadrant of the right breast.  The satellite was not really seen but I think this is just 1 large process. ?She did also have a biopsy of an axillary node that was negative and this was concordant so nodes appear to be negative clinically and radiologically now" ?Since then she has completed ac and done fairly well. she doesn't really note mass now. She has two more cycles. She has been seen by plastic surgery both at [facility] and here at [facility]. She would like to proceed with expanders and does desire bilateral nsm which I think is reasonable.  ?  ? ? ?Medical History: ?Past Medical History:  ?Diagnosis Date  ? History of anorexia nervosa  ? History of depression  ? Irregular heart beat  ? Pneumonia  ?several times in childhood  ? 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 Vertigo  ? VSD (ventricular septal defect)  ?Diagnosed in 2000 but never seen before or since  ? ?Patient Active Problem List  ?Diagnosis  ? Cancer of right breast  (CMS-HCC)  ? Lump in upper inner quadrant of right breast  ? History of anorexia nervosa  ? Vertigo  ? ?Past Surgical History:  ?Procedure Laterality Date  ? Foot surgery to repair 5th metatarsal Left 08/14/1999  ? CESAREAN SECTION 11/14/2011  ? ABDOMINOPLASTY 11/2017  ? UMBILICAL HERNIA REPAIR [id number]/6283  ? LAPAROSCOPIC TUBAL LIGATION 11/2017  ?at time of abdominoplasty; tubes removed  ? Port a Cath placement 06/24/2021  ? CESAREAN SECTION N/A  ?03/22/2014  ? Wisdom tooth extraction N/A  ?Date Unknown  ? ? ?Allergies  ?Allergen Reactions  ? Cefepime Rash  ? ?Current Outpatient Medications on File Prior to Visit  ?Medication Sig Dispense Refill  ? acetaminophen (TYLENOL) 500 MG tablet Take 1,000 mg by mouth every 8 (eight) hours as needed for Pain  ? dexAMETHasone (DECADRON) 4 MG tablet dexamethasone 4 mg tablet ?2 tablets twice a day by oral route.  ? diphenhydrAMINE (BENADRYL) injection Inject into the vein once  ? famotidine (PEPCID ORAL) Take by mouth  ? famotidine (PEPCID) 10 mg/mL injection Inject into the vein  ? lidocaine-prilocaine (EMLA) cream lidocaine-prilocaine 2.5 %-2.5 % topical cream  ? loratadine (CLARITIN) 10 mg tablet Take 10 mg by mouth as directed As per regimen after chemo (Patient not taking: Reported on 10/01/2021)  ? LORazepam (ATIVAN) 1 MG tablet Take 1 mg  by mouth every 8 (eight) hours as needed for Anxiety (Patient not taking: Reported on 10/01/2021)  ? metoclopramide (REGLAN) 5 MG tablet  ? PRENATAL VIT W-CA,FE,FA,<1 MG, (PRENATAL VITAMIN ORAL) Take by mouth once daily (Patient not taking: Reported on 07/15/2021)  ? prochlorperazine (COMPAZINE) 10 MG tablet Take 10 mg by mouth every 6 (six) hours as needed  ? sodium chloride 0.9% SolP 1,000 mL with PACLitaxeL 6 mg/mL Conc chemo infusion Inject into the peritoneum once  ? sodium chloride 0.9% SolP 10 mL with CARBOplatin 10 mg/mL Soln 15 mg INTRA-ARTERIAL chemo injection Inject 15 mg into the artery once  ? ?No current facility-administered  medications on file prior to visit.  ? ?Family History  ?Problem Relation Age of Onset  ? Breast cancer Mother 57  ? Cancer Mother  ? Osteopenia Mother  ? Parkinsonism Mother 69  ? Ulcerative colitis Mother  ? Heart disease Father  ? Skin cancer Father  ? Coronary Artery Disease (Blocked arteries around heart) Father  ? Hyperlipidemia (Elevated cholesterol) Father  ? High blood pressure (Hypertension) Father  ? Cancer Father  ? Kidney disease Father  ? Depression Father  ? Melanoma Father 62  ? Pulmonary fibrosis Father  ? Hyperlipidemia (Elevated cholesterol) Brother  ? High blood pressure (Hypertension) Brother  ? Meniere's disease Brother  ? No Known Problems Maternal Aunt  ? No Known Problems Maternal Uncle  ? No Known Problems Paternal Aunt  ? Breast cancer Maternal Grandmother  ?in her 76s  ? Heart disease Maternal Grandfather  ? Ovarian cancer Paternal Grandmother  ?dxed late 41s, met to lung  ? Cancer Paternal Grandmother  ? Stroke Paternal Grandfather  ? Birth defects Son  ?Hypospadius  ? Seizures Daughter  ? Other Daughter  ?Robertsonian translocation chromosome 34 & 63  ? Clotting disorder Neg Hx  ? Diabetes Neg Hx  ? Colon cancer Neg Hx  ? Leukemia Neg Hx  ? Liver cancer Neg Hx  ? Pancreatic cancer Neg Hx  ? Prostate cancer Neg Hx  ? Uterine cancer Neg Hx  ? Thyroid cancer Neg Hx  ? Stomach cancer Neg Hx  ? Kidney cancer Neg Hx  ? Anesthesia problems Neg Hx  ? ? ?Social History  ? ?Tobacco Use  ?Smoking Status Never  ?Smokeless Tobacco Never  ? ? ?Social History  ? ?Socioeconomic History  ? Marital status: Married  ?Spouse name: [name]  ? Number of children: 2  ? Highest education level: Professional school degree (e.g., MD, DDS, DVM, JD)  ?Occupational History  ? Occupation: attorney  ?Tobacco Use  ? Smoking status: Never  ? Smokeless tobacco: Never  ?Vaping Use  ? Vaping Use: Never used  ?Substance and Sexual Activity  ? Alcohol use: Not Currently  ?Comment: 1-2 drinks 3-4 days a week  ? Drug use: Never   ? Sexual activity: Never  ? ?Objective:  ? ?There were no vitals filed for this visit.  ?There is no height or weight on file to calculate BMI. ? ?Physical Exam ?Constitutional:  ?Appearance: Normal appearance.  ?Chest:  ?Breasts: ?Right: No inverted nipple, mass or nipple discharge.  ?Left: No inverted nipple, mass or nipple discharge.  ?Comments: Left [place] port in place ?Lymphadenopathy:  ?Upper Body:  ?Right upper body: No supraclavicular or axillary adenopathy.  ?Left upper body: No supraclavicular or axillary adenopathy.  ?Neurological:  ?Mental Status: She is alert.  ? ? ? ?Assessment and Plan:  ? ? ?Malignant neoplasm of overlapping sites of right breast  in female, estrogen receptor positive (CMS-HCC) ?Right nipple sparing mastectomy, right axillary sentinel lymph node biopsy with mag trace, left risk reducing nipple sparing mastectomy ? ?We had a long discussion today about options. ?We discussed a sentinel lymph node biopsy as she does not appear to having lymph node involvement right now. We discussed the performance of that with injection of tracer. We discussed that there is a chance of having a positive node with a sentinel lymph node biopsy and we will await the permanent pathology to make any other first further decisions in terms of her treatment. We discussed up to a 5% risk lifetime of chronic shoulder pain as well as lymphedema associated with a sentinel lymph node biopsy. ?We discussed the options for treatment of the breast cancer again. I do not think she is a candidate for a lumpectomy and she does not desire this. We discussed that I do think she is likely a candidate for a nipple sparing mastectomy. We discussed the performance of that as well as with expander placement. We discussed the risks including nipple areolar loss, skin loss, lack of sensation, possible positive nipple biopsy requiring excision after final pathology returns. She would like to proceed with expanders and then make a  final decision on reconstruction whether to have implant-based here or to go to [facility] for flap based reconstruction. She is also interested in a risk reducing mastectomy on the other side. Her genetics are n

## 2021-12-07 NOTE — Anesthesia Procedure Notes (Signed)
Procedure Name: Intubation ?Date/Time: 12/07/2021 8:39 AM ?Performed by: Glory Buff, CRNA ?Pre-anesthesia Checklist: Patient identified, Emergency Drugs available, Suction available and Patient being monitored ?Patient Re-evaluated:Patient Re-evaluated prior to induction ?Oxygen Delivery Method: Circle system utilized ?Preoxygenation: Pre-oxygenation with 100% oxygen ?Induction Type: IV induction ?Ventilation: Mask ventilation without difficulty ?Laryngoscope Size: Sabra Heck and 3 ?Grade View: Grade I ?Tube type: Oral ?Tube size: 7.0 mm ?Number of attempts: 1 ?Airway Equipment and Method: Stylet and Oral airway ?Placement Confirmation: ETT inserted through vocal cords under direct vision, positive ETCO2 and breath sounds checked- equal and bilateral ?Secured at: 21 cm ?Tube secured with: Tape ?Dental Injury: Teeth and Oropharynx as per pre-operative assessment  ? ? ? ? ?

## 2021-12-07 NOTE — Op Note (Signed)
Preoperative diagnosis: Clinical stage II right breast cancer status post primary chemotherapy ?Postoperative diagnosis: Same as above ?Procedure: ?1.  Right nipple sparing mastectomy ?2.  Injection of mag trace for sentinel lymph node identification ?3.  Right deep axillary sentinel lymph node biopsy ?4.  Left risk reducing nipple sparing mastectomy ?Surgeon: Dr. Serita Grammes ?Estimated blood loss: 50 cc ?Anesthesia: General with bilateral pectoral blocks ?Specimens: ?1.  Left breast tissue marked short superior, long lateral, double nipple areola ?2.  Left nipple biopsy ?3.  Right breast tissue marked short superior, long lateral, double nipple areola ?4.  Right nipple biopsy ?5.  Right deep axillary sentinel lymph nodes with highest count of 3028 ?Complications: None ?Disposition Case turned over to plastic surgery for reconstruction ? ?Indications: This is a 46 year old female who had a palpable breast mass noted.  Mammogram and ultrasound showed a 3 cm mass that underwent a biopsy there is a grade 3 weakly ER/PR positive HER2 negative invasive ductal carcinoma.  Her proliferation index was 70%.  Her genetic testing was negative.  MRI showed a negative left breast no abnormal lymph nodes as well as a 3.1 cm mass in the upper inner quadrant of the right breast.  She did have a biopsy initially of an axillary node that was negative and this was concordant at that time.  She is undergone primary chemotherapy.  She has a good result with very little present on a follow-up MRI.  We discussed all of her options and have elected proceed with bilateral nipple sparing mastectomy with expander reconstruction and sentinel lymph node biopsy on the right.  We have decided to leave her port in place for now. ? ?Procedure: After informed consent was obtained she first underwent bilateral pectoral blocks.  She was given antibiotics.  SCDs were in place.  She was placed under general anesthesia without complication.  A timeout  was then performed. ? ?I prepped the area around the right areola.  I then injected 2 cc of mag trace in the subareolar position.  She was then prepped and draped in the standard sterile surgical fashion.  A surgical timeout was again performed. ? ?I did the left risk reducing side first.  I made a 9 cm inframammary incision about 8 cm from the xiphoid.  I created the posterior flap first.  The fascia was removed from the muscle to the sternum, superior aspect and the lateral aspect of the breast tissue.  I then created the anterior flap with a combination of cautery as well as facelift scissors.  The breast tissue was then removed and marked as above.  I did then remove the nipple.  While doing this I did make a small rent in the nipple that I repaired with 3-0 Vicryl suture.  Hemostasis was obtained.  I then packed this side and moved to the other side. ? ?I made a similar incision on the right side.  I remove the breast tissue in the same way I did the left.  I did the nipple biopsy on the right side and this was all passed off the table as a specimen.  Hemostasis was observed.  I then identified the sentinel node in the low axilla.  I made incision and carried this through the axillary fascia.  I used the probe to identify the lymph nodes of which there.  There were several normal-appearing lymph nodes.  I remove these.  The highest count is listed as above.  There is no background activity.  Hemostasis was obtained.  I closed the axillary fascia with 2-0 Vicryl.  This was closed with 3-0 Vicryl, 4-0 Monocryl.  I then turned the case over to plastic surgery for reconstruction. ?

## 2021-12-07 NOTE — Progress Notes (Signed)
Assisted Dr. Christella Hartigan with left, right, pectoralis, ultrasound guided block. Side rails up, monitors on throughout procedure. See vital signs in flow sheet. Tolerated Procedure well. ?

## 2021-12-07 NOTE — Transfer of Care (Signed)
Immediate Anesthesia Transfer of Care Note ? ?Patient: Ebony Lamb ? ?Procedure(s) Performed: RIGHT NIPPLE SPARING MASTECTOMY WITH AXILLARY SENTINEL LYMPH NODE BIOPSY (Right: Breast) ?LEFT NIPPLE SPARING MASTECTOMY (Left: Breast) ?BREAST RECONSTRUCTION WITH PLACEMENT OF TISSUE EXPANDER AND ALLODERM (Bilateral: Breast) ? ?Patient Location: PACU ? ?Anesthesia Type:General ? ?Level of Consciousness: drowsy and patient cooperative ? ?Airway & Oxygen Therapy: Patient Spontanous Breathing and Patient connected to face mask oxygen ? ?Post-op Assessment: Report given to RN and Post -op Vital signs reviewed and stable ? ?Post vital signs: Reviewed and stable ? ?Last Vitals:  ?Vitals Value Taken Time  ?BP    ?Temp    ?Pulse    ?Resp    ?SpO2    ? ? ?Last Pain:  ?Vitals:  ? 12/07/21 0705  ?TempSrc: Oral  ?PainSc: 0-No pain  ?   ? ?  ? ?Complications: No notable events documented. ?

## 2021-12-07 NOTE — Anesthesia Postprocedure Evaluation (Signed)
Anesthesia Post Note ? ?Patient: Ebony Lamb ? ?Procedure(s) Performed: RIGHT NIPPLE SPARING MASTECTOMY WITH AXILLARY SENTINEL LYMPH NODE BIOPSY (Right: Breast) ?LEFT NIPPLE SPARING MASTECTOMY (Left: Breast) ?BREAST RECONSTRUCTION WITH PLACEMENT OF TISSUE EXPANDER AND ALLODERM (Bilateral: Breast) ? ?  ? ?Patient location during evaluation: PACU ?Anesthesia Type: General ?Level of consciousness: awake and alert ?Pain management: pain level controlled ?Vital Signs Assessment: post-procedure vital signs reviewed and stable ?Respiratory status: spontaneous breathing, nonlabored ventilation and respiratory function stable ?Cardiovascular status: blood pressure returned to baseline and stable ?Postop Assessment: no apparent nausea or vomiting ?Anesthetic complications: no ? ? ?No notable events documented. ? ?Last Vitals:  ?Vitals:  ? 12/07/21 1315 12/07/21 1330  ?BP: 115/62   ?Pulse: 96   ?Resp: 15   ?Temp:    ?SpO2: 96% 97%  ?  ?Last Pain:  ?Vitals:  ? 12/07/21 1330  ?TempSrc:   ?PainSc: 4   ? ? ?  ?  ?  ?  ?  ?  ? ?Lidia Collum ? ? ? ? ?

## 2021-12-07 NOTE — Interval H&P Note (Signed)
History and Physical Interval Note: ? ?12/07/2021 ?7:52 AM ? ?Ebony Lamb  has presented today for surgery, with the diagnosis of RIGHT BREAST CANCER.  The various methods of treatment have been discussed with the patient and family. After consideration of risks, benefits and other options for treatment, the patient has consented to  Procedure(s): ?RIGHT NIPPLE SPARING MASTECTOMY WITH AXILLARY SENTINEL LYMPH NODE BIOPSY (Right) ?LEFT NIPPLE SPARING MASTECTOMY (Left) ?BREAST RECONSTRUCTION WITH PLACEMENT OF TISSUE EXPANDER AND ALLODERM (Bilateral) as a surgical intervention.  The patient's history has been reviewed, patient examined, no change in status, stable for surgery.  I have reviewed the patient's chart and labs.  Questions were answered to the patient's satisfaction.   ? ? ?Ebony Lamb ? ? ?

## 2021-12-07 NOTE — Anesthesia Procedure Notes (Signed)
Anesthesia Regional Block: Pectoralis block  ? ?Pre-Anesthetic Checklist: , timeout performed,  Correct Patient, Correct Site, Correct Laterality,  Correct Procedure, Correct Position, site marked,  Risks and benefits discussed,  Surgical consent,  Pre-op evaluation,  At surgeon's request and post-op pain management ? ?Laterality: Right ? ?Prep: chloraprep     ?  ?Needles:  ?Injection technique: Single-shot ? ?Needle Type: Echogenic Stimulator Needle   ? ? ?Needle Length: 10cm  ?Needle Gauge: 20  ? ? ? ?Additional Needles: ? ? ?Procedures:,,,, ultrasound used (permanent image in chart),,    ?Narrative:  ?Start time: 12/07/2021 8:01 AM ?End time: 12/07/2021 8:05 AM ?Injection made incrementally with aspirations every 5 mL. ? ?Performed by: Personally  ?Anesthesiologist: Lidia Collum, MD ? ?Additional Notes: ?Standard monitors applied. Skin prepped. Good needle visualization with ultrasound. Injection made in 5cc increments with no resistance to injection. Patient tolerated the procedure well. ? ? ? ? ? ?

## 2021-12-07 NOTE — Interval H&P Note (Signed)
History and Physical Interval Note: ? ?12/07/2021 ?8:25 AM ? ?Ebony Lamb  has presented today for surgery, with the diagnosis of RIGHT BREAST CANCER.  The various methods of treatment have been discussed with the patient and family. After consideration of risks, benefits and other options for treatment, the patient has consented to  Procedure(s): ?RIGHT NIPPLE SPARING MASTECTOMY WITH AXILLARY SENTINEL LYMPH NODE BIOPSY (Right) ?LEFT NIPPLE SPARING MASTECTOMY (Left) ?BREAST RECONSTRUCTION WITH PLACEMENT OF TISSUE EXPANDER AND ALLODERM (Bilateral) as a surgical intervention.  The patient's history has been reviewed, patient examined, no change in status, stable for surgery.  I have reviewed the patient's chart and labs.  Questions were answered to the patient's satisfaction.   ? ? ?Rolm Bookbinder ? ? ?

## 2021-12-07 NOTE — Op Note (Signed)
Operative Note  ? ?DATE OF OPERATION: 3.27.23 ? ?LOCATION: [facility]-observation ? ?SURGICAL DIVISION: Plastic Surgery ? ?PREOPERATIVE DIAGNOSES:  1. Right breast cancer overlapping sites ER+  ? ?POSTOPERATIVE DIAGNOSES:  same ? ?PROCEDURE:  1. Bilateral breast reconstruction with tissue expanders 2. Acellular dermis (Alloderm) to bilateral chest 600 cm2 ? ?SURGEON: Irene Limbo MD MBA ? ?ASSISTANT: none ? ?ANESTHESIA:  General.  ? ?EBL: 150 ml for entire procedure ? ?COMPLICATIONS: None immediate.  ? ?INDICATIONS FOR PROCEDURE:  ?The patient, Ebony Lamb, is a 46 y.o. female born on 1975/10/05, is here for immediate prepectoral breast reconstruction following nipple sparing mastectomies. ?  ?FINDINGS: Natrelle 133S FV-11-T 300 ml tissue expanders placed bilateral initial fill volume 150 ml air. RIGHT SN [id number] LEFT SN [id number] ? ?DESCRIPTION OF PROCEDURE:  ?The patient was marked with the patient in the preoperative area to mark sternal notch, chest midline, anterior axillary lines and inframammary folds. Following induction, Foley catheter placed. The patient's operative site was prepped and draped in a sterile fashion. A time out was performed and all information was confirmed to be correct. I assisted in mastectomies with exposure and retraction. Following completion of mastectomies, reconstruction began on the left side. Hemostasis ensured. Cavity irrigated with saline solution containing Ancef, gentamicin, and Betadine. A 19 Fr drain was placed in subcutaneous position laterally and a 15 Fr drain placed along inframammary fold. Each secured to skin with 2-0 nylon. The tissue expanders were prepared on back table prior in insertion. The expander was filled with air. Perforated acellular dermis was draped over anterior surface expander. The ADM was then secured to itself over posterior surface of expander with 4-0 chromic. Redundant folds acellular dermis excised so that the ADM lay flat without  folds over air filled expander. The expander was secured to fascia over lateral sternal border with a 0 vicryl. The lateral tab was also secured to pectoralis muscle with 0-vicryl. The ADM was secured to pectoralis muscle and chest wall along inferior border at inframammary fold with 0 V-lock suture. Laterally the mastectomy flap over posterior axillary line was advanced anteriorly and the subcutaneous tissue and superficial fascia was secured to pectoralis and serratus muscles with 0-vicryl. Skin closure completed with 3-0 vicryl in fascial layer and 4-0 vicryl in dermis. Skin closure completed with 4-0 monocryl subcuticular and tissue adhesive. Perforation of nipple areola incurred during mastectomy dissection repaired primarily with interrupted 4-0 and 5-0 monocryl in dermis and skin closure with running 4-0 chromic suture. ?  ?I then directed my attention to right chest where similar irrigation and drain placement completed. The prepared expander with ADM secured over anterior surface was placed in left chest and tabs secured to chest wall and pectoralis muscle with 0- vicryl suture. The acellular dermis at inframammary fold was secured to chest wall with 0 V-lock suture. Laterally the mastectomy flap over posterior axillary line was advanced anteriorly and the subcutaneous tissue and superficial fascia was secured to pectoralis and serratus muscles with 0-vicryl. Skin closure completed with 3-0 vicryl in fascial layer and 4-0 vicryl in dermis. Skin closure completed with 4-0 monocryl subcuticular. The mastectomy flaps were redraped so that NAC was symmetric from sternal notch and chest midline. Tissue adhesive applied over all incisions. Tegaderm dressings applied followed by dry dressing, breast binder. ?  ?The patient was allowed to wake from anesthesia, extubated and taken to the recovery room in satisfactory condition.  ? ?SPECIMENS: none ? ?DRAINS: 15 and 19 Fr JP in right and left  breast  reconstruction ? ?

## 2021-12-07 NOTE — Anesthesia Procedure Notes (Signed)
Anesthesia Regional Block: Pectoralis block  ? ?Pre-Anesthetic Checklist: , timeout performed,  Correct Patient, Correct Site, Correct Laterality,  Correct Procedure, Correct Position, site marked,  Risks and benefits discussed,  Surgical consent,  Pre-op evaluation,  At surgeon's request and post-op pain management ? ?Laterality: Left ? ?Prep: chloraprep     ?  ?Needles:  ?Injection technique: Single-shot ? ?Needle Type: Echogenic Stimulator Needle   ? ? ?Needle Length: 10cm  ?Needle Gauge: 20  ? ? ? ?Additional Needles: ? ? ?Procedures:,,,, ultrasound used (permanent image in chart),,    ?Narrative:  ?Start time: 12/07/2021 8:05 AM ?End time: 12/07/2021 8:07 AM ?Injection made incrementally with aspirations every 5 mL. ? ?Performed by: Personally  ?Anesthesiologist: Lidia Collum, MD ? ?Additional Notes: ?Standard monitors applied. Skin prepped. Good needle visualization with ultrasound. Injection made in 5cc increments with no resistance to injection. Patient tolerated the procedure well. ? ? ? ? ? ?

## 2021-12-08 ENCOUNTER — Encounter (HOSPITAL_BASED_OUTPATIENT_CLINIC_OR_DEPARTMENT_OTHER): Payer: Self-pay | Admitting: General Surgery

## 2021-12-08 DIAGNOSIS — N6031 Fibrosclerosis of right breast: Secondary | ICD-10-CM | POA: Diagnosis not present

## 2021-12-08 NOTE — Discharge Instructions (Signed)
Next dose of Oxycodone after 11:30 am Tuesday 12/08/21 ? ?About my Jackson-Pratt Bulb Drain ? ?What is a Jackson-Pratt bulb? ?A Jackson-Pratt is a soft, round device used to collect drainage. It is connected to a long, thin drainage catheter, which is held in place by one or two small stiches near your surgical incision site. When the bulb is squeezed, it forms a vacuum, forcing the drainage to empty into the bulb. ? ?Emptying the Jackson-Pratt bulb- ?To empty the bulb: ?1. Release the plug on the top of the bulb. ?2. Pour the bulb's contents into a measuring container which your nurse will provide. ?3. Record the time emptied and amount of drainage. Empty the drain(s) as often as your     doctor or nurse recommends. ? ?Date                  Time                    Amount (Drain 1)                 Amount (Drain 2) ? ?_____________________________________________________________________ ? ?_____________________________________________________________________ ? ?_____________________________________________________________________ ? ?_____________________________________________________________________ ? ?_____________________________________________________________________ ? ?_____________________________________________________________________ ? ?_____________________________________________________________________ ? ?_____________________________________________________________________ ? ?Squeezing the Jackson-Pratt Bulb- ?To squeeze the bulb: ?1. Make sure the plug at the top of the bulb is open. ?2. Squeeze the bulb tightly in your fist. You will hear air squeezing from the bulb. ?3. Replace the plug while the bulb is squeezed. ?4. Use a safety pin to attach the bulb to your clothing. This will keep the catheter from     pulling at the bulb insertion site. ? ?When to call your doctor- ?Call your doctor if: ?Drain site becomes red, swollen or hot. ?You have a fever greater than 101 degrees F. ?There is oozing at  the drain site. ?Drain falls out (apply a guaze bandage over the drain hole and secure it with tape). ?Drainage increases daily not related to activity patterns. (You will usually have more drainage when you are active than when you are resting.) ?Drainage has a bad odor. ?  ?

## 2021-12-08 NOTE — Discharge Summary (Signed)
Physician Discharge Summary  ?Patient ID: ?Ebony Lamb ?MRN: [id number] ?DOB/AGE: 1976/08/06 46 y.o. ? ?Admit date: 12/07/2021 ?Discharge date: 12/08/2021 ? ?Admission Diagnoses: ?Right breast cancer ? ?Discharge Diagnoses:  ?Principal Problem: ?  Breast cancer, right ([place]) ? ? ?Discharged Condition: stable ? ?Hospital Course: Post operatively patient did well with pain controlled, tolerating diet and ambulatory with minimal assist. Instructed on bathing and drain care.  ? ?Treatments: surgery: bilateral nipple sparing mastectomies with sentinel node bilateral breast reconstruction with tissue expanders acellular dermis 3.27.23 ? ?Discharge Exam: ?Blood pressure 113/73, pulse 67, temperature 98.4 ?F (36.9 ?C), resp. rate 16, height '5\' 6"'$  (1.676 m), weight 53.8 kg, SpO2 97 %, unknown if currently breastfeeding. ?Incision/Wound: chest soft incisions dry intact Tegaderms in place drains serosanguinous ? ?Disposition: Discharge disposition: 01-Home or Self Care ? ? ? ? ? ? ?Discharge Instructions   ? ? Call MD for:  redness, tenderness, or signs of infection (pain, swelling, bleeding, redness, odor or green/yellow discharge around incision site)   Complete by: As directed ?  ? Call MD for:  temperature >100.5   Complete by: As directed ?  ? Discharge instructions   Complete by: As directed ?  ? Ok to remove dressings and shower am 3.29.23. Soap and water ok, pat Tegaderms dry. Do not remove Tegaderms. No creams or ointments over incisions. Do not let drains dangle in shower, attach to lanyard or similar.Strip and record drains twice daily and bring log to clinic visit. ? ?Breast binder or soft compression bra all other times. ? ?Ok to raise arms above shoulders for bathing and dressing.  No house yard work or exercise until cleared by MD.  ? ?Recommend ibuprofen with meals to aid with pain control. Also ok to use Tylenol for pain. Recommend Miralax or Dulcolax as needed for constipation. Patient received all Rx preop.  ?  Driving Restrictions   Complete by: As directed ?  ? No driving for 2 weeks then no driving if taking prescription pain medication  ? Lifting restrictions   Complete by: As directed ?  ? No lifting > 5-10 lbs until cleared by MD  ? Resume previous diet   Complete by: As directed ?  ? ?  ? ?Allergies as of 12/08/2021   ? ?   Reactions  ? Cefepime Rash  ? ?  ? ?  ?Medication List  ?  ? ?TAKE these medications   ? ?lidocaine-prilocaine cream ?Commonly known as: EMLA ?Apply to affected area once ?  ?loratadine 10 MG tablet ?Commonly known as: CLARITIN ?Take 1 tablet (10 mg total) by mouth daily. Take day post chemo and then daily for 10 days with each treatment ?  ?LORazepam 0.5 MG tablet ?Commonly known as: Ativan ?Take 1 tablet (0.5 mg total) by mouth at bedtime as needed (Nausea or vomiting). ?  ? ?  ? ? Follow-up Information   ? ? Rolm Bookbinder, MD Follow up in 2 week(s).   ?Specialty: General Surgery ?Contact information: ?[address] ?[address] ?[place] [address] ?[phone] ? ? ?  ?  ? ? Irene Limbo, MD Follow up in 1 week(s).   ?Specialty: Plastic Surgery ?Why: as scheduled ?Contact information: ?[address] ?[address] ?[place] [address] ?[phone] ? ? ?  ?  ? ?  ?  ? ?  ? ? ?Signed: Irene Limbo ?12/08/2021, 7:11 AM ? ? ?

## 2021-12-09 LAB — SURGICAL PATHOLOGY

## 2021-12-11 ENCOUNTER — Encounter: Payer: Self-pay | Admitting: *Deleted

## 2021-12-14 NOTE — Progress Notes (Signed)
? ?Patient Care Team: ?Swayne, David, MD as PCP - General (Family Medicine) ?Squire, Sarah, MD as Attending Physician (Radiation Oncology) ?Martini, Keisha N, RN as Oncology Nurse Navigator ?Stuart, Dawn C, RN as Oncology Nurse Navigator ?Pinn, Walda, MD as Referring Physician (Obstetrics and Gynecology) ?Pace, Collier S, MD as Consulting Physician (Plastic Surgery) ?, , MD as Consulting Physician (Hematology and Oncology) ?Wakefield, Matthew, MD as Consulting Physician (General Surgery) ? ?DIAGNOSIS:  ?Encounter Diagnosis  ?Name Primary?  ? Malignant neoplasm of upper-outer quadrant of right breast in female, estrogen receptor positive (HCC)   ? ? ?SUMMARY OF ONCOLOGIC HISTORY: ?Oncology History  ?Malignant neoplasm of upper-outer quadrant of right breast in female, estrogen receptor positive (HCC)  ?06/04/2021 Initial Diagnosis  ? T2 N0 IDC, grade 3, ER 15% weak to moderate staining, PR 15% weak to moderate staining (functionally triple negative with HER2 not amplified), with a Ki-67 of 70% (breast MRI 3.1 cm mass) ?  ?06/10/2021 Cancer Staging  ? Staging form: Breast, AJCC 8th Edition ?- Clinical stage from 06/10/2021: Stage IIA (cT2, cN0, cM0, G3, ER+, PR+, HER2-) - Signed by Magrinat, Gustav C, MD on 06/10/2021 ?Stage prefix: Initial diagnosis ?Histologic grading system: 3 grade system ?Laterality: Right ?Staged by: Pathologist and managing physician ?Stage used in treatment planning: Yes ?National guidelines used in treatment planning: Yes ?Type of national guideline used in treatment planning: NCCN ? ?  ?06/16/2021 Genetic Testing  ? [facility] CancerNext-Expanded was negative. No pathogenic variants were identified. 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 Report date is 06/19/2021. ? ?The CancerNext-Expanded gene panel offered by [facility] and includes sequencing, rearrangement, and RNA analysis for the following 77 genes: AIP, ALK, APC, ATM, AXIN2, BAP1, BARD1,  BLM, BMPR1A, BRCA1, BRCA2, BRIP1, CDC73, CDH1, CDK4, CDKN1B, CDKN2A, CHEK2, CTNNA1, DICER1, FANCC, FH, FLCN, GALNT12, KIF1B, LZTR1, MAX, MEN1, MET, MLH1, MSH2, MSH3, MSH6, MUTYH, NBN, NF1, NF2, NTHL1, PALB2, PHOX2B, PMS2, POT1, PRKAR1A, PTCH1, PTEN, RAD51C, RAD51D, RB1, RECQL, RET, SDHA, SDHAF2, SDHB, SDHC, SDHD, SMAD4, SMARCA4, SMARCB1, SMARCE1, STK11, SUFU, TMEM127, TP53, TSC1, TSC2, VHL and XRCC2 (sequencing and deletion/duplication); EGFR, EGLN1, HOXB13, KIT, MITF, PDGFRA, POLD1, and POLE (sequencing only); EPCAM and GREM1 (deletion/duplication only).  ?  ?06/25/2021 -  Neo-Adjuvant Chemotherapy  ? neoadjuvant chemotherapy consisting of doxorubicin and cyclophosphamide in dose dense fashion x4 started 0/13/2022, completed 08/10/2021, to be followed by weekly carboplatin and paclitaxel x12 starting 08/24/2021 ?  ?12/07/2021 Surgery  ? Left mastectomy: Benign ?Right mastectomy: Negative for residual cancer, 0/4 lymph nodes negative pathologic complete response, ?  ? ? ?CHIEF COMPLIANT: Follow up to review the pathology report after bilateral mastectomy  ? ?HISTORY: Ebony Lamb is a  46 y.o. with above-mentioned history of weakly estrogen receptor positive breast cancer. She is currently on neoadjuvant chemotherapy.  She presents to the clinic today for follow-up. She state everything went well after surgery.  She is in the process of undergoing expansion of her expanders.  She intends to undergo implant based reconstruction sometime in October.  She was also inquiring about the role of hysterectomy and bilateral salpingo-oophorectomy. ? ? ?ALLERGIES:  is allergic to cefepime. ? ?MEDICATIONS:  ?Current Outpatient Medications  ?Medication Sig Dispense Refill  ? [START ON 01/11/2022] tamoxifen (NOLVADEX) 20 MG tablet Take 1 tablet (20 mg total) by mouth 2 (two) times daily. 90 tablet 3  ? lidocaine-prilocaine (EMLA) cream Apply to affected area once 30 g 3  ? loratadine (CLARITIN) 10 MG tablet Take 1   tablet (10 mg  total) by mouth daily. Take day post chemo and then daily for 10 days with each treatment 30 tablet 3  ? LORazepam (ATIVAN) 0.5 MG tablet Take 1 tablet (0.5 mg total) by mouth at bedtime as needed (Nausea or vomiting). 30 tablet 0  ? ?No current facility-administered medications for this visit.  ? ?Facility-Administered Medications Ordered in Other Visits  ?Medication Dose Route Frequency Provider Last Rate Last Admin  ? influenza vac split quadrivalent PF (FLUARIX) 0.5 ML injection           ? influenza vac split quadrivalent PF (FLUARIX) injection 0.5 mL  0.5 mL Intramuscular Once Magrinat, Gustav C, MD      ? ? ?PHYSICAL EXAMINATION: ?ECOG PERFORMANCE STATUS: 1 - Symptomatic but completely ambulatory ? ?Vitals:  ? 12/15/21 1023  ?BP: (!) 126/91  ?Pulse: 93  ?Resp: 17  ?Temp: (!) 97.3 ?F (36.3 ?C)  ?SpO2: 98%  ? ?Filed Weights  ? 12/15/21 1023  ?Weight: 117 lb 1.6 oz (53.1 kg)  ? ?  ? ?LABORATORY DATA:  ?I have reviewed the data as listed ? ?  Latest Ref Rng & Units 11/13/2021  ?  8:15 AM 11/06/2021  ?  9:10 AM 10/30/2021  ?  9:37 AM  ?CMP  ?Glucose 70 - 99 mg/dL 105   104   102    ?BUN 6 - 20 mg/dL 10   8   11    ?Creatinine 0.44 - 1.00 mg/dL 0.59   0.58   0.57    ?Sodium 135 - 145 mmol/L 140   140   139    ?Potassium 3.5 - 5.1 mmol/L 3.9   3.6   3.8    ?Chloride 98 - 111 mmol/L 108   108   107    ?CO2 22 - 32 mmol/L 28   27   27    ?Calcium 8.9 - 10.3 mg/dL 9.5   9.4   9.2    ?Total Protein 6.5 - 8.1 g/dL 6.6   6.5   6.7    ?Total Bilirubin 0.3 - 1.2 mg/dL 0.4   0.4   0.4    ?Alkaline Phos 38 - 126 U/L 81   71   65    ?AST 15 - 41 U/L 27   30   25    ?ALT 0 - 44 U/L 32   31   27    ? ? ?Lab Results  ?Component Value Date  ? WBC 7.8 11/13/2021  ? HGB 11.5 (L) 11/13/2021  ? HCT 33.0 (L) 11/13/2021  ? MCV 97.9 11/13/2021  ? PLT 170 11/13/2021  ? NEUTROABS 6.3 11/13/2021  ? ? ?ASSESSMENT & PLAN:  ?Malignant neoplasm of upper-outer quadrant of right breast in female, estrogen receptor positive (HCC) ?06/04/2021:T2 N0 IDC,  grade 3, ER 15% weak to moderate staining, PR 15% weak to moderate staining (functionally triple negative with HER2 not amplified), with a Ki-67 of 70% (breast MRI 3.1 cm mass) ?  ?Treatment Plan: ?1. Neo adj chemo with AC X 4 followed by Taxol and Carboplatin weekly X 12 ?2. bilateral mastectomies with reconstruction 12/07/2021: Pathologic complete response in the right breast 0/4 lymph nodes negative ?3. Adj Anti estrogen therapy ?------------------------------------------------------------------------------------------------------------------- ?Pathology counseling: I discussed the final pathology report of the patient provided  a copy of this report. I discussed the margins as well as lymph node surgeries. We also discussed the final staging along with previously performed ER/PR and HER-2/neu testing. ? ?  Treatment plan: Adjuvant antiestrogen therapy with tamoxifen in milligrams daily x 5-10 years ? ?Tamoxifen counseling:We discussed the risks and benefits of tamoxifen. These include but not limited to insomnia, hot flashes, mood changes, vaginal dryness, and weight gain. Although rare, serious side effects including endometrial cancer, risk of blood clots were also discussed. We strongly believe that the benefits far outweigh the risks. Patient understands these risks and consented to starting treatment. Planned treatment duration is 10 years. ?I sent a prescription today for her to start tamoxifen at 10 mg daily on 01/11/2022. ?She tolerates it well then she will increase it to the full dose. ?If she does undergo hysterectomy and bilateral salpingo-oophorectomy and then we can switch her to aromatase inhibitors. ? ?Because of complete response I do not recommend adjuvant immunotherapy. ?I requested Dr. Donne Hazel to remove her port. ? ?Return to clinic in 3 months for survivorship care plan visit ? ? ? ?No orders of the defined types were placed in this encounter. ? ?The patient has a good understanding of the  overall plan. she agrees with it. she will call with any problems that may develop before the next visit here. ?Total time spent: 30 mins including face to face time and time spent for planning, charting and c

## 2021-12-15 ENCOUNTER — Other Ambulatory Visit: Payer: Self-pay

## 2021-12-15 ENCOUNTER — Encounter: Payer: Self-pay | Admitting: *Deleted

## 2021-12-15 ENCOUNTER — Inpatient Hospital Stay: Payer: No Typology Code available for payment source | Attending: Oncology | Admitting: Hematology and Oncology

## 2021-12-15 DIAGNOSIS — C50411 Malignant neoplasm of upper-outer quadrant of right female breast: Secondary | ICD-10-CM

## 2021-12-15 DIAGNOSIS — Z9012 Acquired absence of left breast and nipple: Secondary | ICD-10-CM | POA: Diagnosis not present

## 2021-12-15 DIAGNOSIS — Z9221 Personal history of antineoplastic chemotherapy: Secondary | ICD-10-CM | POA: Diagnosis not present

## 2021-12-15 DIAGNOSIS — Z90710 Acquired absence of both cervix and uterus: Secondary | ICD-10-CM | POA: Diagnosis not present

## 2021-12-15 DIAGNOSIS — Z170 Estrogen receptor positive status [ER+]: Secondary | ICD-10-CM | POA: Insufficient documentation

## 2021-12-15 MED ORDER — TAMOXIFEN CITRATE 20 MG PO TABS: 20.0000 mg | ORAL_TABLET | Freq: Two times a day (BID) | ORAL | 3 refills | Status: DC

## 2021-12-15 NOTE — Assessment & Plan Note (Signed)
06/04/2021:T2 N0 IDC, grade 3, ER 15% weak to moderate staining, PR 15% weak to moderate staining (functionally triple negative with HER2 not amplified), with a Ki-67 of 70% (breast MRI 3.1 cm mass) ?? ?Treatment Plan: ?1. Neo adj chemo with AC X 4 followed by Taxol and Carboplatin weekly X 12 ?2. bilateral mastectomies with reconstruction 12/07/2021: Pathologic complete response in the right breast 0/4 lymph nodes negative ?3. Adj Anti estrogen therapy ?------------------------------------------------------------------------------------------------------------------- ?Pathology counseling: I discussed the final pathology report of the patient provided  a copy of this report. I discussed the margins as well as lymph node surgeries. We also discussed the final staging along with previously performed ER/PR and HER-2/neu testing. ? ?Treatment plan: Adjuvant antiestrogen therapy with tamoxifen in milligrams daily x10 years ? ?Tamoxifen counseling:We discussed the risks and benefits of tamoxifen. These include but not limited to insomnia, hot flashes, mood changes, vaginal dryness, and weight gain. Although rare, serious side effects including endometrial cancer, risk of blood clots were also discussed. We strongly believe that the benefits far outweigh the risks. Patient understands these risks and consented to starting treatment. Planned treatment duration is 10 years. ? ?Return to clinic in 3 months for survivorship care plan visit ?

## 2021-12-16 ENCOUNTER — Encounter: Payer: Self-pay | Admitting: Hematology and Oncology

## 2021-12-16 ENCOUNTER — Telehealth: Payer: Self-pay | Admitting: Adult Health

## 2021-12-16 NOTE — Telephone Encounter (Signed)
Scheduled appointment per 4/4 los. Patient is aware. ?

## 2021-12-30 ENCOUNTER — Encounter: Payer: Self-pay | Admitting: *Deleted

## 2021-12-30 ENCOUNTER — Encounter: Payer: Self-pay | Admitting: Hematology and Oncology

## 2021-12-30 NOTE — Progress Notes (Signed)
Per MD request, [employer] testing order successfully faxed [phone]).  ?

## 2021-12-31 ENCOUNTER — Ambulatory Visit: Payer: No Typology Code available for payment source | Attending: Surgery | Admitting: Physical Therapy

## 2021-12-31 ENCOUNTER — Encounter: Payer: Self-pay | Admitting: Physical Therapy

## 2021-12-31 DIAGNOSIS — Z483 Aftercare following surgery for neoplasm: Secondary | ICD-10-CM | POA: Insufficient documentation

## 2021-12-31 DIAGNOSIS — Z170 Estrogen receptor positive status [ER+]: Secondary | ICD-10-CM | POA: Diagnosis not present

## 2021-12-31 DIAGNOSIS — M25611 Stiffness of right shoulder, not elsewhere classified: Secondary | ICD-10-CM | POA: Insufficient documentation

## 2021-12-31 DIAGNOSIS — C50411 Malignant neoplasm of upper-outer quadrant of right female breast: Secondary | ICD-10-CM | POA: Diagnosis present

## 2021-12-31 DIAGNOSIS — M25612 Stiffness of left shoulder, not elsewhere classified: Secondary | ICD-10-CM | POA: Insufficient documentation

## 2021-12-31 DIAGNOSIS — R293 Abnormal posture: Secondary | ICD-10-CM | POA: Insufficient documentation

## 2021-12-31 NOTE — Therapy (Signed)
?OUTPATIENT PHYSICAL THERAPY BREAST CANCER POST OP FOLLOW UP ? ? ?Patient Name: Ebony Lamb ?MRN: [id number] ?DOB:12-07-1975, 46 y.o., female ?Today's Date: 12/31/2021 ? ? PT End of Session - 12/31/21 1251   ? ? Visit Number 2   ? Number of Visits 10   ? Date for PT Re-Evaluation 01/28/22   ? PT Start Time 1118   ? PT Stop Time 1230   ? PT Time Calculation (min) 72 min   ? Activity Tolerance Patient tolerated treatment well   ? Behavior During Therapy [facility] for tasks assessed/performed   ? ?  ?  ? ?  ? ? ?Past Medical History:  ?Diagnosis Date  ? Abnormal Pap smear 09/13/2000  ? Allergy 02/2020  ? Seasonal  ? Breast cancer ([name])   ? Depression   ? history of depression- denies currently  ? Hypertension   ? Preterm labor   ? ?Past Surgical History:  ?Procedure Laterality Date  ? BREAST BIOPSY Right 07/27/2019  ? BREAST RECONSTRUCTION WITH PLACEMENT OF TISSUE EXPANDER AND ALLODERM Bilateral 12/07/2021  ? Procedure: BREAST RECONSTRUCTION WITH PLACEMENT OF TISSUE EXPANDER AND ALLODERM;  Surgeon: Irene Limbo, MD;  Location: [facility];  Service: Plastics;  Laterality: Bilateral;  ? CESAREAN SECTION  11/14/2011  ? Procedure: CESAREAN SECTION;  Surgeon: Eli Hose, MD;  Location: [facility];  Service: Gynecology;  Laterality: N/A;  Primary cesarean section with delivery of baby boy at [phone]. Apgars 9/9.  ? CESAREAN SECTION N/A 03/22/2014  ? Procedure: Repeat CESAREAN SECTION;  Surgeon: Alwyn Pea, MD;  Location: [facility];  Service: Obstetrics;  Laterality: N/A;  ? COSMETIC SURGERY  11/22/2017  ? Abdominoplasty  ? FOOT SURGERY    ? FRACTURE SURGERY  08/1999  ? Repair of 5th metatarsal in left foot  ? HERNIA REPAIR  11/22/2017  ? NIPPLE SPARING MASTECTOMY Left 12/07/2021  ? Procedure: LEFT NIPPLE SPARING MASTECTOMY;  Surgeon: Rolm Bookbinder, MD;  Location: [place];  Service: General;  Laterality: Left;  ? NIPPLE SPARING MASTECTOMY WITH SENTINEL LYMPH NODE BIOPSY Right 12/07/2021  ?  Procedure: RIGHT NIPPLE SPARING MASTECTOMY WITH AXILLARY SENTINEL LYMPH NODE BIOPSY;  Surgeon: Rolm Bookbinder, MD;  Location: [place];  Service: General;  Laterality: Right;  ? PORTACATH PLACEMENT N/A 06/24/2021  ? Procedure: INSERTION PORT-A-CATH;  Surgeon: Coralie Keens, MD;  Location: [place];  Service: General;  Laterality: N/A;  ? TUBAL LIGATION  11/22/2017  ? Tubes were completely removed  ? WISDOM TOOTH EXTRACTION    ? ?Patient Active Problem List  ? Diagnosis Date Noted  ? Breast cancer, right ([name]) 12/07/2021  ? Port-A-Cath in place 08/10/2021  ? Genetic testing 06/17/2021  ? Family history of breast cancer 06/10/2021  ? Family history of ovarian cancer 06/10/2021  ? Family history of BRCA gene mutation 06/10/2021  ? Malignant neoplasm of upper-outer quadrant of right breast in female, estrogen receptor positive ([name]) 06/08/2021  ? 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 Cesarean delivery delivered 03/22/2014  ? Pregnancy induced hypertension--with 1st pregnancy 12/27/2011  ? History of irregular menstrual bleeding 11/12/2011  ? History of eating disorder 11/12/2011  ? History of cardiac arrhythmia - PAC's - cardiac work-up normal 11/12/2011  ? ? ?PCP: Antony Contras, MD ? ?REFERRING PROVIDER: Coralie Keens, MD ? ?REFERRING DIAG: Right breast cancer ? ?THERAPY DIAG:  ?Aftercare following surgery for neoplasm ? ?Malignant neoplasm of upper-outer quadrant of right breast in female,  estrogen receptor positive ([name]) ? ?Abnormal posture ? ?Stiffness of right shoulder, not elsewhere classified ? ?Stiffness of left shoulder, not elsewhere classified ? ?ONSET DATE: 12/07/2021 ? ?SUBJECTIVE:                                                                                                                                                                                          ? ?SUBJECTIVE STATEMENT: ?Patient reports she underwent neoadjuvant  chemotherapy from 06/25/2021 - 11/13/2021 and then had bilateral mastectomies with a right sentinel node biopsy (4 negative nodes) on 12/07/2021 with bilateral expanders placed. She is in the process of having expanders filled and will undergo anti-estrogen therapy beginning 01/11/2022. Drains were removed a week ago. ? ?PERTINENT HISTORY:  ?Patient was diagnosed on 06/01/2021 with right grade III invasive ductal carcinoma breast cancer. She underwent neoadjuvant chemotherapy and then had bilateral mastectomies with a right sentinel node biopsy (4 negative nodes) on 12/07/2021. 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  ? ?PATIENT GOALS:  Reassess how my recovery is going related to arm function, pain, and swelling. ? ?PAIN:  ?Are you having pain? Yes: NPRS scale: 3/10 ?Pain location: Right arm and just inferior to her expanders ?Pain description: burning and tight ?Aggravating factors: nothing ?Relieving factors: nothing ? ?PRECAUTIONS: Recent Surgery, right UE Lymphedema risk ? ?ACTIVITY LEVEL / LEISURE: She has not been exercising yet. Has been taking care of her 21 and 49 y.o. kids. She has not returned to work yet. ? ? ?OBJECTIVE:  ? ?PATIENT SURVEYS:  ?QUICK DASH: ? [name] - 12/31/21 0001   ? ? Open a tight or new jar Unable   ? Do heavy household chores (wash walls, wash floors) Unable   ? Carry a shopping bag or briefcase Mild difficulty   ? Wash your back Mild difficulty   ? Use a knife to cut food No difficulty   ? Recreational activities in which you take some force or impact through your arm, shoulder, or hand (golf, hammering, tennis) Unable   ? During the past week, to what extent has your arm, shoulder or hand problem interfered with your normal social activities with family, friends, neighbors, or groups? Extremely   ? During the past week, to what extent has your arm, shoulder or hand problem limited your work or other regular daily activities Extremely   ? Arm, shoulder, or hand pain.  Moderate   ? Tingling (pins and needles) in your arm, shoulder, or hand Moderate   ? Difficulty Sleeping Mild difficulty   ? DASH Score 61.36 %   ? ?  ?  ? ?  ? ? ? ?  OBSERVATIONS: ? Breast incisions appear to be well healed. Her left nipple has some necrotic black tissue present. This is being addressed by Dr. Iran Planas and she sees Dr. Donne Hazel next week. No swelling present and no redness.  ? ?POSTURE:  ?Forward head and rounded shoulders posture ? ?     06/10/2022   12/31/2021 ? Right Shoulder Extension 57 Degrees 54  ?Right Shoulder Flexion 166 Degrees  125  ?Right Shoulder ABduction 168 Degrees  141  ?Right Shoulder Internal Rotation 75 Degrees  88  ?Right Shoulder External Rotation 90 Degrees  62  ?Left Shoulder Extension 60 Degrees  47  ?Left Shoulder Flexion 155 Degrees  142  ?Left Shoulder ABduction 175 Degrees  157  ?Left Shoulder Internal Rotation 73 Degrees  76  ?Left Shoulder External Rotation 85 Degrees  70  ? ?Right Upper Extremity Lymphedema 06/10/2022 12/31/2021  ?  10 cm Proximal to Olecranon Process 24 cm  22.8  ?  Olecranon Process 23.4 cm  22.4  ?  10 cm Proximal to Ulnar Styloid Process 19 cm  17.8  ?  Just Proximal to Ulnar Styloid Process 14.8 cm  14.5  ?  Across Hand at [employer] 17.1 cm  17.5  ?  At [facility] of 2nd Digit 5.9 cm  5.9  ?      ?  Left Upper Extremity Lymphedema   ?  10 cm Proximal to Olecranon Process 23.7 cm  22.3  ?  Olecranon Process 22.8 cm  22.2  ?  10 cm Proximal to Ulnar Styloid Process 18.3 cm  17.7  ?  Just Proximal to Ulnar Styloid Process 14.2 cm  13.9  ?  Across Hand at [employer] 18.2 cm  17.8  ?  At [facility] of 2nd Digit 5.7 cm  5.7  ?  ? ?  ?Surgery type/Date: 12/07/2021 ?Number of lymph nodes removed: 4 ?Current/past treatment (chemo, radiation, hormone therapy): neoadjuvant chemotherapy ?Other symptoms:  ?Heaviness/tightness Yes ?Pain Yes ?Pitting edema No ?Infections No ?Decreased scar mobility Yes ?Stemmer sign No ? ? ?PATIENT EDUCATION:  ?Education details:  Post op HEP and aftercare ?Person educated: Patient ?Education method: Explanation, Demonstration, and Handouts ?Education comprehension: verbalized understanding and returned demonstration ? ? ?[place]

## 2021-12-31 NOTE — Patient Instructions (Signed)
After Breast Cancer Class ?It is recommended you attend the ABC class to be educated on lymphedema risk reduction. This class is free of charge and lasts for 1 hour. It is a 1-time class. You will need to download the Webex app either on your phone or computer. We will send you a link the night before or the morning of the class. You should be able to click on that link to join the class. This is not a confidential class. You don't have to turn your camera on, but other participants may be able to see your email address. You are scheduled for May 1st at 11:00. ? ?Scar massage ?You can begin gentle scar massage to you incision sites. Gently place one hand on the incision and move the skin (without sliding on the skin) in various directions. Do this for a few minutes and then you can gently massage either coconut oil or vitamin E cream into the scars. You are not ready to do scar massage until the incisions ar further healed but start applying some coconut oil or Eucerin cream to the dry areas. ? ?Compression garment ?You should continue wearing your compression bra until you feel like you no longer have swelling. ? ?Home exercise Program ?Continue doing the exercises you were given until you feel like you can do them without feeling any tightness at the end.  ? ?Walking Program ?Studies show that 30 minutes of walking per day (fast enough to elevate your heart rate) can significantly reduce the risk of a cancer recurrence. If you can't walk due to other medical reasons, we encourage you to find another activity you could do (like a stationary bike or water exercise). ? ?Posture ?After breast cancer surgery, people frequently sit with rounded shoulders posture because it puts their incisions on slack and feels better. If you sit like this and scar tissue forms in that position, you can become very tight and have pain sitting or standing with good posture. Try to be aware of your posture and sit and stand up tall to  heal properly. ? ?Follow up PT: ?It is recommended you return every 3 months for the first 3 years following surgery to be assessed on the SOZO machine for an L-Dex score. This helps prevent clinically significant lymphedema in 95% of patients. These follow up screens are 10 minute appointments that you are not billed for. You are scheduled for June 19th at 1:50pm. ?

## 2022-01-01 ENCOUNTER — Other Ambulatory Visit: Payer: Self-pay | Admitting: *Deleted

## 2022-01-01 DIAGNOSIS — C50411 Malignant neoplasm of upper-outer quadrant of right female breast: Secondary | ICD-10-CM

## 2022-01-03 NOTE — Therapy (Signed)
?OUTPATIENT PHYSICAL THERAPY TREATMENT NOTE ? ? ?Patient Name: Ebony Lamb ?MRN: [id number] ?DOB:03/15/1976, 46 y.o., female ?Today's Date: 01/04/2022 ? ?PCP: Antony Contras, MD ?REFERRING PROVIDER: Coralie Keens, MD ? ?END OF SESSION:  ? PT End of Session - 01/04/22 1007   ? ? Visit Number 3   ? Number of Visits 10   ? Date for PT Re-Evaluation 01/28/22   ? PT Start Time 1007   ? PT Stop Time 1056   ? PT Time Calculation (min) 49 min   ? Activity Tolerance Patient tolerated treatment well   ? Behavior During Therapy [facility] for tasks assessed/performed   ? ?  ?  ? ?  ? ? ?Past Medical History:  ?Diagnosis Date  ? Abnormal Pap smear 09/13/2000  ? Allergy 02/2020  ? Seasonal  ? Breast cancer ([place])   ? Depression   ? history of depression- denies currently  ? Hypertension   ? Preterm labor   ? ?Past Surgical History:  ?Procedure Laterality Date  ? BREAST BIOPSY Right 07/27/2019  ? BREAST RECONSTRUCTION WITH PLACEMENT OF TISSUE EXPANDER AND ALLODERM Bilateral 12/07/2021  ? Procedure: BREAST RECONSTRUCTION WITH PLACEMENT OF TISSUE EXPANDER AND ALLODERM;  Surgeon: Irene Limbo, MD;  Location: [employer] [facility];  Service: Plastics;  Laterality: Bilateral;  ? CESAREAN SECTION  11/14/2011  ? Procedure: CESAREAN SECTION;  Surgeon: Eli Hose, MD;  Location: [facility];  Service: Gynecology;  Laterality: N/A;  Primary cesarean section with delivery of baby boy at [phone]. Apgars 9/9.  ? CESAREAN SECTION N/A 03/22/2014  ? Procedure: Repeat CESAREAN SECTION;  Surgeon: Alwyn Pea, MD;  Location: [facility];  Service: Obstetrics;  Laterality: N/A;  ? COSMETIC SURGERY  11/22/2017  ? Abdominoplasty  ? FOOT SURGERY    ? FRACTURE SURGERY  08/1999  ? Repair of 5th metatarsal in left foot  ? HERNIA REPAIR  11/22/2017  ? NIPPLE SPARING MASTECTOMY Left 12/07/2021  ? Procedure: LEFT NIPPLE SPARING MASTECTOMY;  Surgeon: Rolm Bookbinder, MD;  Location: [place];  Service: General;  Laterality: Left;  ? NIPPLE  SPARING MASTECTOMY WITH SENTINEL LYMPH NODE BIOPSY Right 12/07/2021  ? Procedure: RIGHT NIPPLE SPARING MASTECTOMY WITH AXILLARY SENTINEL LYMPH NODE BIOPSY;  Surgeon: Rolm Bookbinder, MD;  Location: [place];  Service: General;  Laterality: Right;  ? PORTACATH PLACEMENT N/A 06/24/2021  ? Procedure: INSERTION PORT-A-CATH;  Surgeon: Coralie Keens, MD;  Location: [place];  Service: General;  Laterality: N/A;  ? TUBAL LIGATION  11/22/2017  ? Tubes were completely removed  ? WISDOM TOOTH EXTRACTION    ? ?Patient Active Problem List  ? Diagnosis Date Noted  ? Breast cancer, right ([place]) 12/07/2021  ? Port-A-Cath in place 08/10/2021  ? Genetic testing 06/17/2021  ? Family history of breast cancer 06/10/2021  ? Family history of ovarian cancer 06/10/2021  ? Family history of BRCA gene mutation 06/10/2021  ? Malignant neoplasm of upper-outer quadrant of right breast in female, estrogen receptor positive ([place]) 06/08/2021  ? 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 Cesarean delivery delivered 03/22/2014  ? Pregnancy induced hypertension--with 1st pregnancy 12/27/2011  ? History of irregular menstrual bleeding 11/12/2011  ? History of eating disorder 11/12/2011  ? History of cardiac arrhythmia - PAC's - cardiac work-up normal 11/12/2011  ? ? ?REFERRING DIAG: Right breast Cancer ? ?THERAPY DIAG:  ?Aftercare following surgery for neoplasm ? ?Malignant neoplasm of upper-outer quadrant of right breast in female, estrogen  receptor positive ([place]) ? ?Abnormal posture ? ?Stiffness of right shoulder, not elsewhere classified ? ?Stiffness of left shoulder, not elsewhere classified ? 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 She underwent neoadjuvant chemotherapy and then had bilateral mastectomies with a right sentinel node biopsy (4 negative nodes) on 12/07/2021. It is functionally triple negative  with a Ki67 of 70%.  ?  ? ?PRECAUTIONS: Recent Surgery, right UE Lymphedema risk ? ?SUBJECTIVE: I wasn't very good about doing the exercises over the weekend. I lost my paper that she gave me in September ? ?PAIN:  ?Are you having pain? Yes: NPRS scale: 4/10 ?Pain location: Right back or arm,axilla and lateral breast, and left breast ?Pain description: tightness, burning ?Aggravating factors: raising, reaching, ?Relieving factors: not raising arms. ? ? ?OBJECTIVE: (objective measures completed at initial evaluation unless otherwise dated) ? ? ? ? ?OBSERVATIONS: ?           Breast incisions appear to be well healed. Her left nipple has some necrotic black tissue present. This is being addressed by Dr. Iran Planas and she sees Dr. Donne Hazel next week. No swelling present and no redness.  ?  ?POSTURE:  ?Forward head and rounded shoulders posture ?  ?                                                            06/10/2022                                12/31/2021 ? Right Shoulder Extension 57 Degrees 54  ?Right Shoulder Flexion 166 Degrees  125  ?Right Shoulder ABduction 168 Degrees  141  ?Right Shoulder Internal Rotation 75 Degrees  88  ?Right Shoulder External Rotation 90 Degrees  62  ?Left Shoulder Extension 60 Degrees  47  ?Left Shoulder Flexion 155 Degrees  142  ?Left Shoulder ABduction 175 Degrees  157  ?Left Shoulder Internal Rotation 73 Degrees  76  ?Left Shoulder External Rotation 85 Degrees  70  ?  ?     ?Right Upper Extremity Lymphedema 06/10/2022 12/31/2021  ?  10 cm Proximal to Olecranon Process 24 cm  22.8  ?  Olecranon Process 23.4 cm  22.4  ?  10 cm Proximal to Ulnar Styloid Process 19 cm  17.8  ?  Just Proximal to Ulnar Styloid Process 14.8 cm  14.5  ?  Across Hand at [employer] 17.1 cm  17.5  ?  At [facility] of 2nd Digit 5.9 cm  5.9  ?         ?  Left Upper Extremity Lymphedema    ?  10 cm Proximal to Olecranon Process 23.7 cm  22.3  ?  Olecranon Process 22.8 cm  22.2  ?  10 cm Proximal to Ulnar Styloid  Process 18.3 cm  17.7  ?  Just Proximal to Ulnar Styloid Process 14.2 cm  13.9  ?  Across Hand at [employer] 18.2 cm  17.8  ?  At [facility] of 2nd Digit 5.7 cm  5.7  ?  ?  ?             ?Surgery type/Date: 12/07/2021 ?Number of lymph nodes removed:  4 ?Current/past treatment (chemo, radiation, hormone therapy): neoadjuvant chemotherapy ?Other symptoms:  ?Heaviness/tightness Yes ?Pain Yes ?Pitting edema No ?Infections No ?Decreased scar mobility Yes ?Stemmer sign No ?  ?  ?PATIENT EDUCATION:  ?Education details: Post op HEP and aftercare ?Person educated: Patient ?Education method: Explanation, Demonstration, and Handouts ?Education comprehension: verbalized understanding and returned demonstration ? TREATMENT TODAY ?01/04/2022 ?MANUAL;soft tissue mobilization to bilateral pectorals, Lats in supine, and in SL to UT and scapular area to decrease tissue tension. ?PROM bilateral shoulder flexion, scaption, abduction, ER ?THERAPEUTIC Exercises;reviewed clasped hand flexion in supine, and stargazer in supine.  Gave pt another handout of pictures ?  ?HOME EXERCISE PROGRAM: ?           Reviewed previously given post op HEP. ?  ?  ?ASSESSMENT: ?  ?CLINICAL IMPRESSION: ?Performed soft tissue mobilization to bilateral upper quarters and PROM to bilateral shoulders. Reviewed post op exercises done in supine for flexion and ER. Pt with significant tightness in right pectorals and scapular area. She relaxed well for PROM and is progressing nicely with ROM. ?  ?Pt will benefit from skilled therapeutic intervention to improve on the following deficits: Decreased knowledge of precautions, impaired UE functional use, pain, decreased ROM, postural dysfunction.  ?  ?PT treatment/interventions: ADL/Self care home management, Therapeutic exercises, Therapeutic activity, Neuromuscular re-education, Balance training, Gait training, Patient/Family education, Joint mobilization, Manual lymph drainage, scar mobilization, and Manual therapy ?  ?   ?  ?  ?GOALS: ?Goals reviewed with patient? Yes ?  ?LONG TERM GOALS:  (STG=LTG) ?  ?GOALS Name Target Date Goal status  ?1 Pt will demonstrate she has regained full shoulder ROM and function post operatively c

## 2022-01-04 ENCOUNTER — Ambulatory Visit: Payer: No Typology Code available for payment source

## 2022-01-04 DIAGNOSIS — C50411 Malignant neoplasm of upper-outer quadrant of right female breast: Secondary | ICD-10-CM

## 2022-01-04 DIAGNOSIS — M25612 Stiffness of left shoulder, not elsewhere classified: Secondary | ICD-10-CM

## 2022-01-04 DIAGNOSIS — R293 Abnormal posture: Secondary | ICD-10-CM

## 2022-01-04 DIAGNOSIS — M25611 Stiffness of right shoulder, not elsewhere classified: Secondary | ICD-10-CM

## 2022-01-04 DIAGNOSIS — Z483 Aftercare following surgery for neoplasm: Secondary | ICD-10-CM

## 2022-01-04 NOTE — Progress Notes (Signed)
Location of Breast Cancer:  ?Malignant neoplasm of upper-outer quadrant of right breast in female, estrogen receptor positive ? ?Histology per Pathology Report:  ?12/07/2021 ?FINAL MICROSCOPIC DIAGNOSIS:  ?A. BREAST, LEFT, MASTECTOMY:  ?- Benign breast with nonproliferative fibrocystic changes including cystic dilatation of ducts, adenosis and extensive stromal fibrosis  ?- Negative for malignancy  ?B. BREAST, LEFT NIPPLE, BIOPSY:  ?- Benign nipple  ?- Negative for malignancy  ?C. BREAST, RIGHT, MASTECTOMY:  ?- Negative for residual carcinoma (ypT0)  ?- Changes consistent with prior biopsy and neoadjuvant therapy  ?- Nonproliferative fibrocystic changes including cystic dilatation of ducts, adenosis and extensive stromal fibrosis  ?- Focal fibromatoid change  ?D. BREAST, RIGHT NIPPLE, BIOPSY:  ?- Benign breast with adenosis and extensive stromal fibrosis  ?- Negative for malignancy  ?E. LYMPH NODE, RIGHT AXILLARY, SENTINEL, EXCISION:  ?- One benign lymph node, negative for carcinoma (0/1)  ?F. LYMPH NODE, RIGHT AXILLARY, SENTINEL, EXCISION:  ?- One benign lymph node, negative for carcinoma (0/1)  ?G. LYMPH NODE, RIGHT AXILLARY, SENTINEL, EXCISION:  ?- One benign lymph node, negative for carcinoma (0/1)  ?H. LYMPH NODE, RIGHT AXILLARY, SENTINEL, EXCISION:  ?- One benign lymph node, negative for carcinoma (0/1)  ? ?Receptor Status: ER(15%), PR (15%), Her2-neu (Negative via FISH), Ki-67(70%) ? ?Did patient present with symptoms (if so, please note symptoms) or was this found on screening mammography?: Patient  presented with a palpable right breast mass at 12 o'clock. She underwent right diagnostic mammography with tomography and right breast ultrasonography at [facility] on 06/01/2021 showing: breast density category D; palpable 3 cm irregular mass in right breast at 12 o'clock; possible 5 mm satellite lesion located 2.5 cm from dominant mass; abnormal right axillary lymph node ? ?Past/Anticipated interventions  by surgeon, if any:  ?12/07/2021 ?--Dr. Rolm Bookbinder ?Procedure: ?1.  Right nipple sparing mastectomy ?2.  Injection of mag trace for sentinel lymph node identification ?3.  Right deep axillary sentinel lymph node biopsy ?4.  Left risk reducing nipple sparing mastectomy ?--Dr. Irene Limbo (Plastic Surgeon) ?1. Bilateral breast reconstruction with tissue expanders  ?2. Acellular dermis (Alloderm) to bilateral chest 600 cm2 ? ?Past/Anticipated interventions by medical oncology, if any:  ?Under care of Dr. Nicholas Lose ?12/15/2021  ?--Treatment Plan: ?Neo adj chemo with AC X 4 followed by Taxol and Carboplatin weekly X 12 ?Final cycle 11/13/2021 ?Breast Conserving surgery with SLN surgery ?Adjuvant XRT ?Adj Anti estrogen therapy ?Tamoxifen in milligrams daily x 5-10 years ?I sent a prescription today for her to start tamoxifen at 10 mg daily on 01/11/2022 ?She tolerates it well then she will increase it to the full dose. ?If she does undergo hysterectomy and bilateral salpingo-oophorectomy and then we can switch her to aromatase inhibitors. ?Because of complete response I do not recommend adjuvant immunotherapy. ?I requested Dr. Donne Hazel to remove her port. ?Return to clinic in 3 months for survivorship care plan visit ? ?Lymphedema issues, if any:  Patient denies. Had PT evaluation yesterday and was told her measurements were improved ? ?Pain issues, if any:  Reports tenderness to left upper chest  where port was removed today. Otherwise reports surgical sites are tolerable  ? ?SAFETY ISSUES: ?Prior radiation? No ?Pacemaker/ICD? No ?Possible current pregnancy? No--has not resumed ?Is the patient on methotrexate? No ? ?Current Complaints / other details:  Nothing else of note ?   ? ? ? ?

## 2022-01-04 NOTE — Progress Notes (Signed)
?Radiation Oncology         [phone]) [phone] ?________________________________ ? ?Name: Ebony Lamb MRN: [id number]  ?Date: 01/05/2022  DOB: 1975/10/03 ? ?Follow-Up Visit Note ? ?Outpatient ? ?CC: Antony Contras, MD  Nicholas Lose, MD ? ?Diagnosis:    ?  ICD-10-CM   ?1. Malignant neoplasm of upper-outer quadrant of right breast in female, estrogen receptor positive (HCC)  C50.411 acetaminophen (TYLENOL) tablet 650 mg  ? Z17.0   ?  ?  ? ?S/p chemotherapy and bilateral nipple sparing mastectomies: Right Breast UOQ, No residual carcinoma ? ? Cancer Staging  ?Malignant neoplasm of upper-outer quadrant of right breast in female, estrogen receptor positive ([place]) ?Staging form: Breast, AJCC 8th Edition ?- Clinical stage from 06/10/2021: Stage IIA (cT2, cN0, cM0, G3, ER+, PR+, HER2-) - Signed by Chauncey Cruel, MD on 06/10/2021 ? ?Stage IIA (cT2, cN0, cM0) Right Breast UOQ Invasive Ductal Carcinoma, ER+ / PR+ / Her2-, Grade 3 ? ?ypT0, ypN0 ? ?CHIEF COMPLAINT: Here to discuss management of right breast cancer ? ?Narrative:  The patient returns today for follow-up.   ?  ?Since breast clinic consultation date of 06/10/21, she underwent the genetic testing on that same date which revealed no clinically actionable mutations by BRCAplus or +RNAinsight testing. 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  ? ?Imaging performed in the interval includes: ?-- Bilateral breast MRI on 06/15/21 demonstrated a rim enhancing mass measuring 2.6 x 2.2 x 3.1 cm within the UIQ of the right breast, consistent with known grade 3 invasive ductal carcinoma. No evidence of lymphadenopathy, or abnormal findings in the left breast were otherwise appreciated.  ?-- Chest CT on 06/22/21 again showed the 2.6 cm peripherally enhancing partially calcified lesion in the superomedial aspect of the right breast corresponding to known right breast carcinoma. CT also revealed some tiny pulmonary  nodules in the right middle lobe measuring 4 mm or less in size, which are highly nonspecific and statistically likely benign. ?-- Whole body bone scan on 06/22/21 showed increased activity over both SI joints, possibly reflective of sacroiliitis . Questionable increased activity about the lower lumbar spine/sacrum was also appreciated. No other bony abnormalities were visualized.  ?-- Post systemic therapy ultrasound performed on 08/11/21 showed an interval decrease in size of the previously biopsied masses and lymph node, consistent with response to chemotherapy. [name] also showed stability of a hypoechoic right breast mass at the 10 o'clock position, 5 cmfn , which was incidentally identified at the time of the ?patient's ultrasound-guided biopsy (this mass was not biopsied or localized). ?-- CT of the pelvis performed in the ED for evaluation of rectal pain (and due to concerns of proctitis or pouchitis) on 08/30/21 showed a large rectal stool ball without adjacent wall thickening or inflammation, and focal short-segment kinking/narrowing of the rectosigmoid colon; favored to be due to a focally decompressed colon, but underlying neoplasm could not be excluded.  ?-- Bilateral breast MRI on 11/09/21 demonstrated near complete resolution of the 2.6 cm enhancing mass in the ?UOQ of the right breast, with only a 1.1 cm area of enhancement immediately surrounding the ?tissue marker clip appreciated on this examination. MRI also showed stability of 2 additional satellite lesions that were identified on a previous ultrasound, and no evidence of right axillary adenopathy. No abnormalities were appreciated in the left breast.  ?-- MRI of the brain on 11/12/21 showed no acute intracranial abnormalities. (MRI did show some mild to moderate  chronic right sphenoid sinus inflammation, increased from last year).  ? ?Systemic therapy, if applicable, involved (dates and therapy as follows): the patient underwent neoadjuvant  chemotherapy consisting of doxorubicin and cyclophosphamide in dose dense fashion x4 from 06/25/2021 through 08/10/2021 under the care of Dr. Jana Hakim and then Dr. Lindi Adie. This was following by further chemotherapy consisting of weekly carboplatin and paclitaxel x12 starting  on 08/24/2021 through 11/11/21. Chemo toxicities encountered by the patient throughout the course of her systemic treatment included: fatigue, chemo induced anemia, leukopenia (significantly improved with Granix injections), myalgias, and occasional fevers following several of her infusions for which she was given prophylactic antibiotics for. (The patient presented to the ED on 09/12/21 for evaluation of fever and myalgias following systemic treatment. Following ED course she was treated for dehydration with IV fluids and discharged home with improvement of symptoms).  ? ?The patient opted to proceed with bilateral nipple sparing mastectomies with nodal biopsies on 12/07/21 under the care of Dr. Donne Hazel. Pathology from right mastectomy revealed: no evidence of residual carcinoma, with changes consistent with prior biopsy and neoadjuvant therapy, and non-proliferative fibrocystic changes including cystic dilatation of ducts, adenosis and extensive stromal fibrosis;  nodal status of 4/4 right axillary sentinel lymph node excisions negative for carcinoma. Pathology from left mastectomy and bilateral nipple biopsies were also negative for malignancy.  ? ?The patient also underwent placement of a tissue expander and alloderm in the right breast during her procedure. She is interested in moving forward with implants and is being followed by Dr. Iran Planas for this.  ? ?During her most recent follow up visit with Dr. Lindi Adie on 12/15/21, the patient agreed to proceed with antiestrogen therapy consisting of tamoxifen. The patient will begin antiestrogens on 01/11/22.  ? ?Of note: the patient underwent biopsies of some small bowel ulcers on 11/20/21  which were negative for malignancy.  ? ?Symptomatically, the patient reports: Healing well from bilateral nipple sparing mastectomies.  She believes she is near expansion with Dr. Iran Planas ?       ?ALLERGIES:  is allergic to cefepime. ? ?Meds: ?Current Outpatient Medications  ?Medication Sig Dispense Refill  ? lidocaine-prilocaine (EMLA) cream Apply to affected area once 30 g 3  ? loratadine (CLARITIN) 10 MG tablet Take 1 tablet (10 mg total) by mouth daily. Take day post chemo and then daily for 10 days with each treatment 30 tablet 3  ? [START ON 01/11/2022] tamoxifen (NOLVADEX) 20 MG tablet Take 1 tablet (20 mg total) by mouth 2 (two) times daily. 90 tablet 3  ? ?No current facility-administered medications for this encounter.  ? ?Facility-Administered Medications Ordered in Other Encounters  ?Medication Dose Route Frequency Provider Last Rate Last Admin  ? acetaminophen (TYLENOL) 325 MG tablet           ? influenza vac split quadrivalent PF (FLUARIX) 0.5 ML injection           ? influenza vac split quadrivalent PF (FLUARIX) injection 0.5 mL  0.5 mL Intramuscular Once Magrinat, Virgie Dad, MD      ? ? ?Physical Findings: ? height is _0  (1.676 m) and weight is 122 lb 6 oz (55.5 kg). Her temporal temperature is 97.1 ?F (36.2 ?C) (abnormal). Her blood pressure is 113/85 and her pulse is 81. Her respiration is 18 and oxygen saturation is 100%. .    ? ?General: Alert and oriented, in no acute distress ?Psychiatric: Judgment and insight are intact. Affect is appropriate. ?Breast exam reveals excellent healing thus far  status post bilateral nipple sparing mastectomies ? ?Lab Findings: ?Lab Results  ?Component Value Date  ? WBC 7.8 11/13/2021  ? HGB 11.5 (L) 11/13/2021  ? HCT 33.0 (L) 11/13/2021  ? MCV 97.9 11/13/2021  ? PLT 170 11/13/2021  ? ? ?_0 @ ? ?Radiographic Findings: ?No results found. ? ?Impression/Plan: ? ?This is a delightful a 46 yo woman dx'ed last year with clinical T2 N0 IDC, grade 3, ER 15%  weak to moderate staining, PR 15% weak to moderate staining, HER2 neg RIGHT breast cancer.  Presented at 12:00 position of breast. MRI clear of any suspicious nodes. Tumor 3.1 cm.  I look at this functionally as a

## 2022-01-05 ENCOUNTER — Ambulatory Visit
Admission: RE | Admit: 2022-01-05 | Discharge: 2022-01-05 | Disposition: A | Discharge status: Home / Self Care | Payer: No Typology Code available for payment source | Source: Ambulatory Visit | Attending: Radiation Oncology | Admitting: Radiation Oncology

## 2022-01-05 ENCOUNTER — Other Ambulatory Visit: Payer: Self-pay

## 2022-01-05 ENCOUNTER — Encounter: Payer: Self-pay | Admitting: Radiation Oncology

## 2022-01-05 VITALS — BP 113/85 | HR 81 | Temp 97.1°F | Resp 18 | Ht 66.0 in | Wt 122.4 lb

## 2022-01-05 DIAGNOSIS — Z170 Estrogen receptor positive status [ER+]: Secondary | ICD-10-CM | POA: Insufficient documentation

## 2022-01-05 DIAGNOSIS — C50411 Malignant neoplasm of upper-outer quadrant of right female breast: Secondary | ICD-10-CM | POA: Insufficient documentation

## 2022-01-05 MED ORDER — ACETAMINOPHEN 325 MG PO TABS
650.0000 mg | ORAL_TABLET | Freq: Once | ORAL | Status: AC
  Administered 2022-01-05: 650 mg via ORAL
  Filled 2022-01-05: qty 2

## 2022-01-05 MED ORDER — ACETAMINOPHEN 325 MG PO TABS
ORAL_TABLET | ORAL | Status: AC
  Filled 2022-01-05: qty 2

## 2022-01-07 ENCOUNTER — Ambulatory Visit: Payer: No Typology Code available for payment source

## 2022-01-07 DIAGNOSIS — R293 Abnormal posture: Secondary | ICD-10-CM

## 2022-01-07 DIAGNOSIS — C50411 Malignant neoplasm of upper-outer quadrant of right female breast: Secondary | ICD-10-CM

## 2022-01-07 DIAGNOSIS — M25611 Stiffness of right shoulder, not elsewhere classified: Secondary | ICD-10-CM

## 2022-01-07 DIAGNOSIS — M25612 Stiffness of left shoulder, not elsewhere classified: Secondary | ICD-10-CM

## 2022-01-07 DIAGNOSIS — Z483 Aftercare following surgery for neoplasm: Secondary | ICD-10-CM

## 2022-01-07 NOTE — Patient Instructions (Signed)
Access Code: [email or web address] ?URL: [email or web address] ?Date: 01/07/2022 ?Prepared by: Cheral Almas ? ?Exercises ?- Supine Lower Trunk Rotation  - 1 x daily - 7 x weekly - 3 reps - 5-10 hold ?

## 2022-01-07 NOTE — Therapy (Signed)
?OUTPATIENT PHYSICAL THERAPY TREATMENT NOTE ? ? ?Patient Name: Ebony Lamb ?MRN: [id number] ?DOB:[birth year], 46 y.o., female ?Today's Date: 01/07/2022 ? ?PCP: Antony Contras, MD ?REFERRING PROVIDER: Coralie Keens, MD ? ?END OF SESSION:  ? PT End of Session - 01/07/22 1205   ? ? Visit Number 4   ? Number of Visits 10   ? Date for PT Re-Evaluation 01/28/22   ? PT Start Time 1109   ? PT Stop Time 1200   ? PT Time Calculation (min) 51 min   ? Activity Tolerance Patient tolerated treatment well   ? Behavior During Therapy [facility] for tasks assessed/performed   ? ?  ?  ? ?  ? ? ? ?Past Medical History:  ?Diagnosis Date  ? Abnormal Pap smear 09/13/2000  ? Allergy 02/2020  ? Seasonal  ? Breast cancer ([place])   ? Depression   ? history of depression- denies currently  ? Hypertension   ? Preterm labor   ? ?Past Surgical History:  ?Procedure Laterality Date  ? BREAST BIOPSY Right 07/27/2019  ? BREAST RECONSTRUCTION WITH PLACEMENT OF TISSUE EXPANDER AND ALLODERM Bilateral 12/07/2021  ? Procedure: BREAST RECONSTRUCTION WITH PLACEMENT OF TISSUE EXPANDER AND ALLODERM;  Surgeon: Irene Limbo, MD;  Location: [name];  Service: Plastics;  Laterality: Bilateral;  ? CESAREAN SECTION  11/14/2011  ? Procedure: CESAREAN SECTION;  Surgeon: Eli Hose, MD;  Location: [facility];  Service: Gynecology;  Laterality: N/A;  Primary cesarean section with delivery of baby boy at [phone]. Apgars 9/9.  ? CESAREAN SECTION N/A 03/22/2014  ? Procedure: Repeat CESAREAN SECTION;  Surgeon: Alwyn Pea, MD;  Location: [facility];  Service: Obstetrics;  Laterality: N/A;  ? COSMETIC SURGERY  11/22/2017  ? Abdominoplasty  ? FOOT SURGERY    ? FRACTURE SURGERY  08/1999  ? Repair of 5th metatarsal in left foot  ? HERNIA REPAIR  11/22/2017  ? NIPPLE SPARING MASTECTOMY Left 12/07/2021  ? Procedure: LEFT NIPPLE SPARING MASTECTOMY;  Surgeon: Rolm Bookbinder, MD;  Location: [facility];  Service: General;  Laterality: Left;  ? NIPPLE  SPARING MASTECTOMY WITH SENTINEL LYMPH NODE BIOPSY Right 12/07/2021  ? Procedure: RIGHT NIPPLE SPARING MASTECTOMY WITH AXILLARY SENTINEL LYMPH NODE BIOPSY;  Surgeon: Rolm Bookbinder, MD;  Location: [facility];  Service: General;  Laterality: Right;  ? PORTACATH PLACEMENT N/A 06/24/2021  ? Procedure: INSERTION PORT-A-CATH;  Surgeon: Coralie Keens, MD;  Location: [facility];  Service: General;  Laterality: N/A;  ? TUBAL LIGATION  11/22/2017  ? Tubes were completely removed  ? WISDOM TOOTH EXTRACTION    ? ?Patient Active Problem List  ? Diagnosis Date Noted  ? Breast cancer, right ([place]) 12/07/2021  ? Port-A-Cath in place 08/10/2021  ? Genetic testing 06/17/2021  ? Family history of breast cancer 06/10/2021  ? Family history of ovarian cancer 06/10/2021  ? Family history of BRCA gene mutation 06/10/2021  ? Malignant neoplasm of upper-outer quadrant of right breast in female, estrogen receptor positive ([place]) 06/08/2021  ? 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 Cesarean delivery delivered 03/22/2014  ? Pregnancy induced hypertension--with 1st pregnancy 12/27/2011  ? History of irregular menstrual bleeding 11/12/2011  ? History of eating disorder 11/12/2011  ? History of cardiac arrhythmia - PAC's - cardiac work-up normal 11/12/2011  ? ? ?REFERRING DIAG: Right breast Cancer ? ?THERAPY DIAG:  ?Aftercare following surgery for neoplasm ? ?Malignant neoplasm of upper-outer quadrant of right breast in female,  estrogen receptor positive ([place]) ? ?Abnormal posture ? ?Stiffness of right shoulder, not elsewhere classified ? ?Stiffness of left shoulder, not elsewhere classified ? 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 She underwent neoadjuvant chemotherapy and then had bilateral mastectomies with a right sentinel node biopsy (4 negative nodes) on 12/07/2021. It is functionally triple negative  with a Ki67 of 70%.  ?  ? ?PRECAUTIONS: Recent Surgery, right UE Lymphedema risk ? ?SUBJECTIVE: I had my port removed on Tuesday. I had a good response to chemo,but they are still wanting me to do radiation. That took me by surprise.   I had made some plans to travel and now I am not sure if I can. I haven't done my exercises yet. ? ?PAIN:  ?Are you having pain? Yes: NPRS scale: 1/10 ?Pain location: Right axilla and lateral breast, and left breast ?Pain description: tightness, burning ?Aggravating factors: raising, reaching, ?Relieving factors: not raising arms. ? ? ?OBJECTIVE: (objective measures completed at initial evaluation unless otherwise dated) ? ? ? ? ?OBSERVATIONS: ?           Breast incisions appear to be well healed. Her left nipple has some necrotic black tissue present. This is being addressed by Dr. Iran Planas and she sees Dr. Donne Hazel next week. No swelling present and no redness.  ?  ?POSTURE:  ?Forward head and rounded shoulders posture ?  ?                                                            06/10/2022                                12/31/2021 ? Right Shoulder Extension 57 Degrees 54  ?Right Shoulder Flexion 166 Degrees  125  ?Right Shoulder ABduction 168 Degrees  141  ?Right Shoulder Internal Rotation 75 Degrees  88  ?Right Shoulder External Rotation 90 Degrees  62  ?Left Shoulder Extension 60 Degrees  47  ?Left Shoulder Flexion 155 Degrees  142  ?Left Shoulder ABduction 175 Degrees  157  ?Left Shoulder Internal Rotation 73 Degrees  76  ?Left Shoulder External Rotation 85 Degrees  70  ?  ?     ?Right Upper Extremity Lymphedema 06/10/2022 12/31/2021  ?  10 cm Proximal to Olecranon Process 24 cm  22.8  ?  Olecranon Process 23.4 cm  22.4  ?  10 cm Proximal to Ulnar Styloid Process 19 cm  17.8  ?  Just Proximal to Ulnar Styloid Process 14.8 cm  14.5  ?  Across Hand at [employer] 17.1 cm  17.5  ?  At [facility] of 2nd Digit 5.9 cm  5.9  ?         ?  Left Upper Extremity Lymphedema    ?  10 cm  Proximal to Olecranon Process 23.7 cm  22.3  ?  Olecranon Process 22.8 cm  22.2  ?  10 cm Proximal to Ulnar Styloid Process 18.3 cm  17.7  ?  Just Proximal to Ulnar Styloid Process 14.2 cm  13.9  ?  Across Hand at [employer] 18.2 cm  17.8  ?  At [facility] of 2nd Digit 5.7  cm  5.7  ?  ?  ?             ?Surgery type/Date: 12/07/2021 ?Number of lymph nodes removed: 4 ?Current/past treatment (chemo, radiation, hormone therapy): neoadjuvant chemotherapy ?Other symptoms:  ?Heaviness/tightness Yes ?Pain Yes ?Pitting edema No ?Infections No ?Decreased scar mobility Yes ?Stemmer sign No ?  ?  ?PATIENT EDUCATION:  ?Education details:trunk rotation bilaterally ?Person educated: Patient ?Education method: Explanation, Demonstration, and Handouts ?Education comprehension: verbalized understanding and returned demonstration ? ? TREATMENT TODAY ?01/07/2022 ? ?MANUAL;soft tissue mobilization to bilateral pectorals, Lats and UT in supine.to decrease tissue tension. ?PROM bilateral shoulder flexion, scaption, abduction, ER ?THERAPEUTIC EXERCISE; Supine bilateral AROM flexion, scaption, horizontal abduction.x5 ?Supine LTR bilaterally with arms outstretched and goal post arms x3 ? ? ?01/04/2022 ?MANUAL;soft tissue mobilization to bilateral pectorals, Lats in supine, and in SL to UT and scapular area to decrease tissue tension. ?PROM bilateral shoulder flexion, scaption, abduction, ER ?THERAPEUTIC Exercises;reviewed clasped hand flexion in supine, and stargazer in supine.  Gave pt another handout of pictures ?  ?HOME EXERCISE PROGRAM: ?           Reviewed previously given post op HEP. ?  ?  ?ASSESSMENT: ?  ?CLINICAL IMPRESSION: ?Performed soft tissue mobilization to bilateral UT, pecs and lats  and PROM to bilateral shoulders Pt did well with AROM exercises and demonstrates good improvement with PROM. Pt is frustrated that radiation is now recommended for her despite her good response to chemo..  ?  ?Pt will benefit from skilled  therapeutic intervention to improve on the following deficits: Decreased knowledge of precautions, impaired UE functional use, pain, decreased ROM, postural dysfunction.  ?  ?PT treatment/interventions: ADL/Self care h

## 2022-01-08 ENCOUNTER — Telehealth: Payer: Self-pay | Admitting: *Deleted

## 2022-01-08 ENCOUNTER — Encounter: Payer: Self-pay | Admitting: Radiation Oncology

## 2022-01-08 NOTE — Progress Notes (Signed)
I left a voicemail for Ms. Markel this morning to let her know that I did connect with one of my colleagues who is a nationally recognized expert in radiation oncology for breast cancer.  He agrees that the risks of radiation outweigh potential benefits and he would not recommend radiation therapy in her case.  I did not hear back from the other colleague I contacted but I think that we have enough reason to hold radiation for her disease.  I wished her the best and encouraged her to call she has any further questions.  I will see her back on an as-needed basis. ?----------------------------------- ? ?Eppie Gibson, MD ? ?

## 2022-01-08 NOTE — Telephone Encounter (Signed)
Received call from pt requesting MD visit to discuss possibility of starting Radiation Therapy.  Appt scheduled, pt notified and verbalized understanding.  ?

## 2022-01-11 ENCOUNTER — Other Ambulatory Visit: Payer: Self-pay

## 2022-01-11 ENCOUNTER — Inpatient Hospital Stay: Payer: No Typology Code available for payment source | Attending: Oncology | Admitting: Hematology and Oncology

## 2022-01-11 DIAGNOSIS — Z170 Estrogen receptor positive status [ER+]: Secondary | ICD-10-CM

## 2022-01-11 DIAGNOSIS — C50411 Malignant neoplasm of upper-outer quadrant of right female breast: Secondary | ICD-10-CM

## 2022-01-11 DIAGNOSIS — Z9013 Acquired absence of bilateral breasts and nipples: Secondary | ICD-10-CM | POA: Diagnosis not present

## 2022-01-11 NOTE — Assessment & Plan Note (Signed)
06/04/2021:T2 N0 IDC, grade 3, ER 15% weak to moderate staining, PR 15% weak to moderate staining (functionally triple negative with?HER2 not amplified), with a Ki-67 of 70% (breast MRI 3.1 cm mass) ?? ?Treatment Plan: ?1. Neo adj chemo with AC X 4 followed by Taxol and Carboplatin weekly X 12 ?2. bilateral mastectomies with reconstruction 12/07/2021: Pathologic complete response in the right breast 0/4 lymph nodes negative ?3. Adj Anti estrogen therapy with tamoxifen for 10 yrs (started 10 mg daily on 01/11/2022: If she undergoes hysterectomy and BSO we can switch her to letrozole) ?------------------------------------------------------------------------------------------------------------------- ?Tamoxifen toxicities: ? ? ? ?

## 2022-01-11 NOTE — Progress Notes (Signed)
? ?Patient Care Team: ?Antony Contras, MD as PCP - General (Family Medicine) ?Eppie Gibson, MD as Attending Physician (Radiation Oncology) ?Rockwell Germany, RN as Oncology Nurse Navigator ?Mauro Kaufmann, RN as Oncology Nurse Navigator ?Sanjuana Kava, MD as Referring Physician (Obstetrics and Gynecology) ?[name], MD as Consulting Physician (Plastic Surgery) ?Nicholas Lose, MD as Consulting Physician (Hematology and Oncology) ?Rolm Bookbinder, MD as Consulting Physician (General Surgery) ? ?DIAGNOSIS:  ?Encounter Diagnosis  ?Name Primary?  ? Malignant neoplasm of upper-outer quadrant of right breast in female, estrogen receptor positive ([place])   ? ? ?SUMMARY OF ONCOLOGIC HISTORY: ?Oncology History  ?Malignant neoplasm of upper-outer quadrant of right breast in female, estrogen receptor positive ([place])  ?06/04/2021 Initial Diagnosis  ? T2 N0 IDC, grade 3, ER 15% weak to moderate staining, PR 15% weak to moderate staining (functionally triple negative with HER2 not amplified), with a Ki-67 of 70% (breast MRI 3.1 cm mass) ?  ?06/10/2021 Cancer Staging  ? Staging form: Breast, AJCC 8th Edition ?- Clinical stage from 06/10/2021: Stage IIA (cT2, cN0, cM0, G3, ER+, PR+, HER2-) - Signed by Chauncey Cruel, MD on 06/10/2021 ?Stage prefix: Initial diagnosis ?Histologic grading system: 3 grade system ?Laterality: Right ?Staged by: Pathologist and managing physician ?Stage used in treatment planning: Yes ?National guidelines used in treatment planning: Yes ?Type of national guideline used in treatment planning: NCCN ? ?  ?06/16/2021 Genetic Testing  ? [facility] CancerNext-Expanded was negative. No pathogenic variants were identified. 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 Report date is 06/19/2021. ? ?The CancerNext-Expanded gene panel offered by [facility] and includes sequencing, rearrangement, and RNA analysis for the following 77 genes: AIP, ALK, APC, ATM, AXIN2, BAP1, BARD1,  BLM, BMPR1A, BRCA1, BRCA2, BRIP1, CDC73, CDH1, CDK4, CDKN1B, CDKN2A, CHEK2, CTNNA1, DICER1, FANCC, FH, FLCN, GALNT12, KIF1B, LZTR1, MAX, MEN1, MET, MLH1, MSH2, MSH3, MSH6, MUTYH, NBN, NF1, NF2, NTHL1, PALB2, PHOX2B, PMS2, POT1, PRKAR1A, PTCH1, PTEN, RAD51C, RAD51D, RB1, RECQL, RET, SDHA, SDHAF2, SDHB, SDHC, SDHD, SMAD4, SMARCA4, SMARCB1, SMARCE1, STK11, SUFU, TMEM127, TP53, TSC1, TSC2, VHL and XRCC2 (sequencing and deletion/duplication); EGFR, EGLN1, HOXB13, KIT, MITF, PDGFRA, POLD1, and POLE (sequencing only); EPCAM and GREM1 (deletion/duplication only).  ?  ?06/25/2021 -  Neo-Adjuvant Chemotherapy  ? neoadjuvant chemotherapy consisting of doxorubicin and cyclophosphamide in dose dense fashion x4 started 0/13/2022, completed 08/10/2021, to be followed by weekly carboplatin and paclitaxel x12 starting 08/24/2021 ?  ?12/07/2021 Surgery  ? Left mastectomy: Benign ?Right mastectomy: Negative for residual cancer, 0/4 lymph nodes negative pathologic complete response, ?  ? ? ?CHIEF COMPLIANT: Follow-up to discuss role of radiation ? ?INTERVAL HISTORY: Ebony Lamb is a 46 y.o with the above mention follow-up to discuss tamoxifen and how surgery went. ?She states she feels pretty good. She states that her knees are hurting when she walks. States it last the whole walk. She states it doesn't hurt when she sitting or laying down, but when she gets up she feels the aches. ?She has received a second opinion consultation for radiation oncology and also met with Dr. Isidore Moos.  She is very confused about whether or not she should go through radiation.  She has not started tamoxifen yet. ? ?ALLERGIES:  is allergic to cefepime. ? ?MEDICATIONS:  ?Current Outpatient Medications  ?Medication Sig Dispense Refill  ? lidocaine-prilocaine (EMLA) cream Apply to affected area once 30 g 3  ? loratadine (CLARITIN) 10 MG tablet Take 1 tablet (10 mg total) by mouth daily. Take  day post chemo and then daily for 10 days with each treatment 30 tablet  3  ? tamoxifen (NOLVADEX) 20 MG tablet Take 1 tablet (20 mg total) by mouth 2 (two) times daily. 90 tablet 3  ? ?No current facility-administered medications for this visit.  ? ?Facility-Administered Medications Ordered in Other Visits  ?Medication Dose Route Frequency Provider Last Rate Last Admin  ? influenza vac split quadrivalent PF (FLUARIX) 0.5 ML injection           ? influenza vac split quadrivalent PF (FLUARIX) injection 0.5 mL  0.5 mL Intramuscular Once Magrinat, Virgie Dad, MD      ? ? ?PHYSICAL EXAMINATION: ?ECOG PERFORMANCE STATUS: 1 - Symptomatic but completely ambulatory ? ?Vitals:  ? 01/11/22 1213  ?BP: 117/68  ?Pulse: 90  ?Resp: 18  ?Temp: (!) 97.5 ?F (36.4 ?C)  ?SpO2: 99%  ? ?Filed Weights  ? 01/11/22 1213  ?Weight: 118 lb 14.4 oz (53.9 kg)  ?  ? ?LABORATORY DATA:  ?I have reviewed the data as listed ? ?  Latest Ref Rng & Units 11/13/2021  ?  8:15 AM 11/06/2021  ?  9:10 AM 10/30/2021  ?  9:37 AM  ?CMP  ?Glucose 70 - 99 mg/dL 105   104   102    ?BUN 6 - 20 mg/dL '10   8   11    ' ?Creatinine 0.44 - 1.00 mg/dL 0.59   0.58   0.57    ?Sodium 135 - 145 mmol/L 140   140   139    ?Potassium 3.5 - 5.1 mmol/L 3.9   3.6   3.8    ?Chloride 98 - 111 mmol/L 108   108   107    ?CO2 22 - 32 mmol/L '28   27   27    ' ?Calcium 8.9 - 10.3 mg/dL 9.5   9.4   9.2    ?Total Protein 6.5 - 8.1 g/dL 6.6   6.5   6.7    ?Total Bilirubin 0.3 - 1.2 mg/dL 0.4   0.4   0.4    ?Alkaline Phos 38 - 126 U/L 81   71   65    ?AST 15 - 41 U/L '27   30   25    ' ?ALT 0 - 44 U/L 32   31   27    ? ? ?Lab Results  ?Component Value Date  ? WBC 7.8 11/13/2021  ? HGB 11.5 (L) 11/13/2021  ? HCT 33.0 (L) 11/13/2021  ? MCV 97.9 11/13/2021  ? PLT 170 11/13/2021  ? NEUTROABS 6.3 11/13/2021  ? ? ?ASSESSMENT & PLAN:  ?Malignant neoplasm of upper-outer quadrant of right breast in female, estrogen receptor positive ([place]) ?06/04/2021:T2 N0 IDC, grade 3, ER 15% weak to moderate staining, PR 15% weak to moderate staining (functionally triple negative with HER2 not  amplified), with a Ki-67 of 70% (breast MRI 3.1 cm mass) ?  ?Treatment Plan: ?1. Neo adj chemo with AC X 4 followed by Taxol and Carboplatin weekly X 12 ?2. bilateral mastectomies with reconstruction 12/07/2021: Pathologic complete response in the right breast 0/4 lymph nodes negative ?3. Adj Anti estrogen therapy with tamoxifen for 10 yrs (started 10 mg daily on 01/11/2022: If she undergoes hysterectomy and BSO we can switch her to letrozole) ?------------------------------------------------------------------------------------------------------------------- ?Radiation counseling: Patient had met with Dr. Isidore Moos and had lengthy discussions.  She also met with radiation oncology at [facility].  After much discussion she is very confused as to whether or  not she needs radiation. ?She notes that the benefits are small and will make a decision on the radiation before starting antiestrogen therapy. ? ? ?No orders of the defined types were placed in this encounter. ? ?The patient has a good understanding of the overall plan. she agrees with it. she will call with any problems that may develop before the next visit here. ?Total time spent: 30 mins including face to face time and time spent for planning, charting and co-ordination of care ? ? Harriette Ohara, MD ?01/11/22 ? ?I Gardiner Coins am scribing for Dr. Lindi Adie ? ?I have reviewed the above documentation for accuracy and completeness, and I agree with the above. ?  ?

## 2022-01-12 ENCOUNTER — Ambulatory Visit: Payer: No Typology Code available for payment source | Attending: Surgery

## 2022-01-12 DIAGNOSIS — C50411 Malignant neoplasm of upper-outer quadrant of right female breast: Secondary | ICD-10-CM | POA: Diagnosis present

## 2022-01-12 DIAGNOSIS — Z170 Estrogen receptor positive status [ER+]: Secondary | ICD-10-CM | POA: Diagnosis present

## 2022-01-12 DIAGNOSIS — M25611 Stiffness of right shoulder, not elsewhere classified: Secondary | ICD-10-CM | POA: Insufficient documentation

## 2022-01-12 DIAGNOSIS — Z483 Aftercare following surgery for neoplasm: Secondary | ICD-10-CM | POA: Insufficient documentation

## 2022-01-12 DIAGNOSIS — R293 Abnormal posture: Secondary | ICD-10-CM | POA: Insufficient documentation

## 2022-01-12 DIAGNOSIS — M25612 Stiffness of left shoulder, not elsewhere classified: Secondary | ICD-10-CM | POA: Insufficient documentation

## 2022-01-12 NOTE — Therapy (Signed)
?OUTPATIENT PHYSICAL THERAPY TREATMENT NOTE ? ? ?Patient Name: Ebony Lamb ?MRN: [id number] ?DOB:1975/09/26, 46 y.o., female ?Today's Date: 01/12/2022 ? ?PCP: Antony Contras, MD ?REFERRING PROVIDER: Coralie Keens, MD ? ?END OF SESSION:  ? PT End of Session - 01/12/22 0806   ? ? Visit Number 5   ? Number of Visits 10   ? Date for PT Re-Evaluation 01/28/22   ? PT Start Time [phone]   ? Activity Tolerance Patient tolerated treatment well   ? Behavior During Therapy [facility] for tasks assessed/performed   ? ?  ?  ? ?  ? ? ? ?Past Medical History:  ?Diagnosis Date  ? Abnormal Pap smear 09/13/2000  ? Allergy 02/2020  ? Seasonal  ? Breast cancer ([place])   ? Depression   ? history of depression- denies currently  ? Hypertension   ? Preterm labor   ? ?Past Surgical History:  ?Procedure Laterality Date  ? BREAST BIOPSY Right 07/27/2019  ? BREAST RECONSTRUCTION WITH PLACEMENT OF TISSUE EXPANDER AND ALLODERM Bilateral 12/07/2021  ? Procedure: BREAST RECONSTRUCTION WITH PLACEMENT OF TISSUE EXPANDER AND ALLODERM;  Surgeon: Irene Limbo, MD;  Location: [place];  Service: Plastics;  Laterality: Bilateral;  ? CESAREAN SECTION  11/14/2011  ? Procedure: CESAREAN SECTION;  Surgeon: Eli Hose, MD;  Location: [facility];  Service: Gynecology;  Laterality: N/A;  Primary cesarean section with delivery of baby boy at [phone]. Apgars 9/9.  ? CESAREAN SECTION N/A 03/22/2014  ? Procedure: Repeat CESAREAN SECTION;  Surgeon: Alwyn Pea, MD;  Location: [facility];  Service: Obstetrics;  Laterality: N/A;  ? COSMETIC SURGERY  11/22/2017  ? Abdominoplasty  ? FOOT SURGERY    ? FRACTURE SURGERY  08/1999  ? Repair of 5th metatarsal in left foot  ? HERNIA REPAIR  11/22/2017  ? NIPPLE SPARING MASTECTOMY Left 12/07/2021  ? Procedure: LEFT NIPPLE SPARING MASTECTOMY;  Surgeon: Rolm Bookbinder, MD;  Location: [place];  Service: General;  Laterality: Left;  ? NIPPLE SPARING MASTECTOMY WITH SENTINEL LYMPH NODE BIOPSY Right  12/07/2021  ? Procedure: RIGHT NIPPLE SPARING MASTECTOMY WITH AXILLARY SENTINEL LYMPH NODE BIOPSY;  Surgeon: Rolm Bookbinder, MD;  Location: [place];  Service: General;  Laterality: Right;  ? PORTACATH PLACEMENT N/A 06/24/2021  ? Procedure: INSERTION PORT-A-CATH;  Surgeon: Coralie Keens, MD;  Location: [place];  Service: General;  Laterality: N/A;  ? TUBAL LIGATION  11/22/2017  ? Tubes were completely removed  ? WISDOM TOOTH EXTRACTION    ? ?Patient Active Problem List  ? Diagnosis Date Noted  ? Breast cancer, right ([place]) 12/07/2021  ? Port-A-Cath in place 08/10/2021  ? Genetic testing 06/17/2021  ? Family history of breast cancer 06/10/2021  ? Family history of ovarian cancer 06/10/2021  ? Family history of BRCA gene mutation 06/10/2021  ? Malignant neoplasm of upper-outer quadrant of right breast in female, estrogen receptor positive ([place]) 06/08/2021  ? 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 Cesarean delivery delivered 03/22/2014  ? Pregnancy induced hypertension--with 1st pregnancy 12/27/2011  ? History of irregular menstrual bleeding 11/12/2011  ? History of eating disorder 11/12/2011  ? History of cardiac arrhythmia - PAC's - cardiac work-up normal 11/12/2011  ? ? ?REFERRING DIAG: Right breast Cancer ? ?THERAPY DIAG:  ?Aftercare following surgery for neoplasm ? ?Malignant neoplasm of upper-outer quadrant of right breast in female, estrogen receptor positive ([place]) ? ?Abnormal posture ? ?Stiffness of right shoulder, not elsewhere classified ? ?  Stiffness of left shoulder, not elsewhere classified ? 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 She underwent neoadjuvant chemotherapy and then had bilateral mastectomies with a right sentinel node biopsy (4 negative nodes) on 12/07/2021. 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  ?  ? ?PRECAUTIONS: Recent Surgery,  right UE Lymphedema risk ? ?SUBJECTIVE:  ? ?Still having pain in my knees with walking. Not sure if its from the chemo or not.  Still have tenderness at lateral breasts, and chest discomfort. I am doing exercises 1x per week. No longer having pain when I push out of my arm. I still have numbness and pain behind my right shoulder. ?PAIN:  ?Are you having pain? Yes: NPRS scale: 0-3/10 ?Pain location: Right axilla and lateral breast, and left breast ?Pain description: tightness, burning ?Aggravating factors: raising, reaching, ?Relieving factors: not raising arms. ? ? ?OBJECTIVE: (objective measures completed at initial evaluation unless otherwise dated) ? ? ? ? ?OBSERVATIONS: ?           Breast incisions appear to be well healed. Her left nipple has some necrotic black tissue present. This is being addressed by Dr. Iran Planas and she sees Dr. Donne Hazel next week. No swelling present and no redness.  ?  ?POSTURE:  ?Forward head and rounded shoulders posture ?  ?                                                            06/10/2022                                12/31/2021 ? Right Shoulder Extension 57 Degrees 54  ?Right Shoulder Flexion 166 Degrees  125  ?Right Shoulder ABduction 168 Degrees  141  ?Right Shoulder Internal Rotation 75 Degrees  88  ?Right Shoulder External Rotation 90 Degrees  62  ?Left Shoulder Extension 60 Degrees  47  ?Left Shoulder Flexion 155 Degrees  142  ?Left Shoulder ABduction 175 Degrees  157  ?Left Shoulder Internal Rotation 73 Degrees  76  ?Left Shoulder External Rotation 85 Degrees  70  ?  ?     ?Right Upper Extremity Lymphedema 06/10/2022 12/31/2021  ?  10 cm Proximal to Olecranon Process 24 cm  22.8  ?  Olecranon Process 23.4 cm  22.4  ?  10 cm Proximal to Ulnar Styloid Process 19 cm  17.8  ?  Just Proximal to Ulnar Styloid Process 14.8 cm  14.5  ?  Across Hand at [employer] 17.1 cm  17.5  ?  At [facility] of 2nd Digit 5.9 cm  5.9  ?         ?  Left Upper Extremity Lymphedema    ?  10 cm  Proximal to Olecranon Process 23.7 cm  22.3  ?  Olecranon Process 22.8 cm  22.2  ?  10 cm Proximal to Ulnar Styloid Process 18.3 cm  17.7  ?  Just Proximal to Ulnar Styloid Process 14.2 cm  13.9  ?  Across Hand at [employer] 18.2 cm  17.8  ?  At [facility] of 2nd Digit 5.7 cm  5.7  ?  ?  ?             ?  Surgery type/Date: 12/07/2021 ?Number of lymph nodes removed: 4 ?Current/past treatment (chemo, radiation, hormone therapy): neoadjuvant chemotherapy ?Other symptoms:  ?Heaviness/tightness Yes ?Pain Yes ?Pitting edema No ?Infections No ?Decreased scar mobility Yes ?Stemmer sign No ?  ?  ?PATIENT EDUCATION:  ?Education details:trunk rotation bilaterally ?Person educated: Patient ?Education method: Explanation, Demonstration, and Handouts ?Education comprehension: verbalized understanding and returned demonstration ? ? TREATMENT TODAY ?01/12/2022 ?THERAPEUTIC  EXERCISES ?Pulleys x 2 min ea for flexion, scaption, and abduction to improve ROM. ?Standing lat stretch x 3 ?Supine AROM bilateral shoulder flexion, scaption and horizontal abduction ?MANUAL ?soft tissue mobilization to bilateral pectorals, Lats and UT in supine.to decrease tissue tension. ?PROM bilateral shoulder flexion, scaption, abduction, ER ? ? ?01/07/2022 ?MANUAL;soft tissue mobilization to bilateral pectorals, Lats and UT in supine.to decrease tissue tension. ?PROM bilateral shoulder flexion, scaption, abduction, ER ?THERAPEUTIC EXERCISE; Supine bilateral AROM flexion, scaption, horizontal abduction.x5 ?Supine LTR bilaterally with arms outstretched and goal post arms x3 ? ? ?01/04/2022 ?MANUAL;soft tissue mobilization to bilateral pectorals, Lats in supine, and in SL to UT and scapular area to decrease tissue tension. ?PROM bilateral shoulder flexion, scaption, abduction, ER ?THERAPEUTIC Exercises;reviewed clasped hand flexion in supine, and stargazer in supine.  Gave pt another handout of pictures ?  ?HOME EXERCISE PROGRAM: ?           Reviewed previously  given post op HEP. ?  ?  ?ASSESSMENT: ?  ?CLINICAL IMPRESSION: ?Performed soft tissue mobilization to bilateral UT, pecs and lats  and PROM to bilateral shoulders  Added pulleys to exss today for warm up and pt did ve

## 2022-01-13 ENCOUNTER — Encounter: Payer: Self-pay | Admitting: Radiation Oncology

## 2022-01-13 NOTE — Progress Notes (Signed)
?  [name] and I have had a few conversations since she followed up with me postoperatively.  She is in a gray zone regarding indications for postmastectomy radiation but I recommended holding radiation because of her node negativity and complete response to chemotherapy. Nevertheless, she understood that she was in a gray zone and that different radiation oncologists could give different recommendations.  She saw a radiation oncologist at [facility] (Dr Minette Brine) the week after she saw me and that radiation oncologist gave a recommendation to proceed with radiation.  I then had a lengthy conversation with that radiation oncologist (Dr. Minette Brine) who essentially is thinking about things the same way as me but fell somewhat softly on the side of recommending radiation while I fell somewhat softly on the side of not recommending it.  ? ?While the patient initially did not want to undergo radiation she changed her mind after talking with Dr. Minette Brine.  [name] understands that my estimate is that she has a 10% chance of chest wall relapse without radiation and a 3% chance of chest wall relapse with radiation.  She understands that there are significant risks to radiation including nipple necrosis and nonhealing wounds.  While I did not recommend radiation I am willing to deliver it given her understanding of the risks and her desire to do everything possible to achieve a cure.    ? ?I spoke her by phone today as she is now undecided regarding postmastectomy radiation.  I have also conferred with other physicians that have weighed in on her care including Dr. Donne Hazel, Dr. Iran Planas, Dr. Concha Pyo, and Dr. Lindi Adie.   ? ?She is going to continue to think about things and get back to me regarding her decision - I encouraged her to call with any questions that arise.    ?----------------------------------- ? ?Eppie Gibson, MD ? ?

## 2022-01-14 ENCOUNTER — Ambulatory Visit: Payer: No Typology Code available for payment source

## 2022-01-14 DIAGNOSIS — R293 Abnormal posture: Secondary | ICD-10-CM

## 2022-01-14 DIAGNOSIS — M25612 Stiffness of left shoulder, not elsewhere classified: Secondary | ICD-10-CM

## 2022-01-14 DIAGNOSIS — Z483 Aftercare following surgery for neoplasm: Secondary | ICD-10-CM

## 2022-01-14 DIAGNOSIS — M25611 Stiffness of right shoulder, not elsewhere classified: Secondary | ICD-10-CM

## 2022-01-14 DIAGNOSIS — C50411 Malignant neoplasm of upper-outer quadrant of right female breast: Secondary | ICD-10-CM

## 2022-01-14 NOTE — Therapy (Signed)
?OUTPATIENT PHYSICAL THERAPY TREATMENT NOTE ? ? ?Patient Name: Ebony Lamb ?MRN: [id number] ?DOB:May 28, 1976, 46 y.o., female ?Today's Date: 01/14/2022 ? ?PCP: Antony Contras, MD ?REFERRING PROVIDER: Coralie Keens, MD ? ?END OF SESSION:  ? PT End of Session - 01/14/22 8657   ? ? Visit Number 6   ? Number of Visits 10   ? Date for PT Re-Evaluation 01/28/22   ? PT Start Time 0809   pt late  ? PT Stop Time 0855   ? PT Time Calculation (min) 46 min   ? Activity Tolerance Patient tolerated treatment well   ? Behavior During Therapy [facility] for tasks assessed/performed   ? ?  ?  ? ?  ? ? ? ?Past Medical History:  ?Diagnosis Date  ? Abnormal Pap smear 09/13/2000  ? Allergy 02/2020  ? Seasonal  ? Breast cancer ([place])   ? Depression   ? history of depression- denies currently  ? Hypertension   ? Preterm labor   ? ?Past Surgical History:  ?Procedure Laterality Date  ? BREAST BIOPSY Right 07/27/2019  ? BREAST RECONSTRUCTION WITH PLACEMENT OF TISSUE EXPANDER AND ALLODERM Bilateral 12/07/2021  ? Procedure: BREAST RECONSTRUCTION WITH PLACEMENT OF TISSUE EXPANDER AND ALLODERM;  Surgeon: Irene Limbo, MD;  Location: [place];  Service: Plastics;  Laterality: Bilateral;  ? CESAREAN SECTION  11/14/2011  ? Procedure: CESAREAN SECTION;  Surgeon: Eli Hose, MD;  Location: [facility];  Service: Gynecology;  Laterality: N/A;  Primary cesarean section with delivery of baby boy at [phone]. Apgars 9/9.  ? CESAREAN SECTION N/A 03/22/2014  ? Procedure: Repeat CESAREAN SECTION;  Surgeon: Alwyn Pea, MD;  Location: [facility] [facility];  Service: Obstetrics;  Laterality: N/A;  ? COSMETIC SURGERY  11/22/2017  ? Abdominoplasty  ? FOOT SURGERY    ? FRACTURE SURGERY  08/1999  ? Repair of 5th metatarsal in left foot  ? HERNIA REPAIR  11/22/2017  ? NIPPLE SPARING MASTECTOMY Left 12/07/2021  ? Procedure: LEFT NIPPLE SPARING MASTECTOMY;  Surgeon: Rolm Bookbinder, MD;  Location: [place];  Service: General;  Laterality: Left;  ?  NIPPLE SPARING MASTECTOMY WITH SENTINEL LYMPH NODE BIOPSY Right 12/07/2021  ? Procedure: RIGHT NIPPLE SPARING MASTECTOMY WITH AXILLARY SENTINEL LYMPH NODE BIOPSY;  Surgeon: Rolm Bookbinder, MD;  Location: [employer];  Service: General;  Laterality: Right;  ? PORTACATH PLACEMENT N/A 06/24/2021  ? Procedure: INSERTION PORT-A-CATH;  Surgeon: Coralie Keens, MD;  Location: [place];  Service: General;  Laterality: N/A;  ? TUBAL LIGATION  11/22/2017  ? Tubes were completely removed  ? WISDOM TOOTH EXTRACTION    ? ?Patient Active Problem List  ? Diagnosis Date Noted  ? Breast cancer, right ([place]) 12/07/2021  ? Port-A-Cath in place 08/10/2021  ? Genetic testing 06/17/2021  ? Family history of breast cancer 06/10/2021  ? Family history of ovarian cancer 06/10/2021  ? Family history of BRCA gene mutation 06/10/2021  ? Malignant neoplasm of upper-outer quadrant of right breast in female, estrogen receptor positive ([place]) 06/08/2021  ? 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 Cesarean delivery delivered 03/22/2014  ? Pregnancy induced hypertension--with 1st pregnancy 12/27/2011  ? History of irregular menstrual bleeding 11/12/2011  ? History of eating disorder 11/12/2011  ? History of cardiac arrhythmia - PAC's - cardiac work-up normal 11/12/2011  ? ? ?REFERRING DIAG: Right breast Cancer ? ?THERAPY DIAG:  ?Aftercare following surgery for neoplasm ? ?Malignant neoplasm of upper-outer quadrant of right  breast in female, estrogen receptor positive ([place]) ? ?Abnormal posture ? ?Stiffness of right shoulder, not elsewhere classified ? ?Stiffness of left shoulder, not elsewhere classified ? 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 She underwent neoadjuvant chemotherapy and then had bilateral mastectomies with a right sentinel node biopsy (4 negative nodes) on 12/07/2021. It is functionally triple  negative with a Ki67 of 70%.  ?  ? ?PRECAUTIONS: Recent Surgery, right UE Lymphedema risk ? ?SUBJECTIVE:  ?My knees are still sore but I did do some knee exercises without pain.  The glue under my arm has come off now. My right arm feels weird, numbness, I'm not sure, but no pain ?PAIN:  ?Are you having pain? Yes in the knees ?PAIN:  ?Are you having pain? Yes ?NPRS scale: 4-5/10when walking ?Pain location: bilateral knees ?Pain orientation: Bilateral and Medial  ?PAIN TYPE: aching ?Pain description: intermittent  ?Aggravating factors: walking ?Relieving factors: rest ? ? ? ?OBJECTIVE: (objective measures completed at initial evaluation unless otherwise dated) ? ? ? ? ?OBSERVATIONS: ?           Breast incisions appear to be well healed. Her left nipple has some necrotic black tissue present. This is being addressed by Dr. Iran Planas and she sees Dr. Donne Hazel next week. No swelling present and no redness.  ?  ?POSTURE:  ?Forward head and rounded shoulders posture ?  ?                                                                                       ? 06/10/22 12/31/21 01/14/22  ? Right Shoulder Extension 57 Degrees 54             63  ?Right Shoulder Flexion 166 Degrees  125           150  ?Right Shoulder ABduction 168 Degrees  141            160  ?Right Shoulder Internal Rotation 75 Degrees  88   ?Right Shoulder External Rotation 90 Degrees  62             100  ?Left Shoulder Extension 60 Degrees  47              55  ?Left Shoulder Flexion 155 Degrees  142              157  ?Left Shoulder ABduction 175 Degrees  157              170  ?Left Shoulder Internal Rotation 73 Degrees  76   ?Left Shoulder External Rotation 85 Degrees  70                95  ?  ?     ?Right Upper Extremity Lymphedema 06/10/2022 12/31/2021  ?  10 cm Proximal to Olecranon Process 24 cm  22.8  ?  Olecranon Process 23.4 cm  22.4  ?  10 cm Proximal to Ulnar Styloid Process 19 cm  17.8  ?  Just Proximal to Ulnar Styloid Process 14.8 cm  14.5  ?  Across  Hand at  [employer] 17.1 cm  17.5  ?  At [facility] of 2nd Digit 5.9 cm  5.9  ?         ?  Left Upper Extremity Lymphedema    ?  10 cm Proximal to Olecranon Process 23.7 cm  22.3  ?  Olecranon Process 22.8 cm  22.2  ?  10 cm Proximal to Ulnar Styloid Process 18.3 cm  17.7  ?  Just Proximal to Ulnar Styloid Process 14.2 cm  13.9  ?  Across Hand at [employer] 18.2 cm  17.8  ?  At [facility] of 2nd Digit 5.7 cm  5.7  ?  ?  ?             ?Surgery type/Date: 12/07/2021 ?Number of lymph nodes removed: 4 ?Current/past treatment (chemo, radiation, hormone therapy): neoadjuvant chemotherapy ?Other symptoms:  ?Heaviness/tightness Yes ?Pain Yes ?Pitting edema No ?Infections No ?Decreased scar mobility Yes ?Stemmer sign No ?  ?  ?PATIENT EDUCATION:  ?Education details:trunk rotation bilaterally ?Person educated: Patient ?Education method: Explanation, Demonstration, and Handouts ?Education comprehension: verbalized understanding and returned demonstration ? ? TREATMENT TODAY ?01/14/2022 ?ThERAPEUTIC EXERCISES; ?Pulleys x 2 min flex and abd, 1 min for scaption ?Ball rolls for flexion x 10, ?MANUAL ?soft tissue mobilization to bilateral pectorals, Lats and UT in supine.to decrease tissue tension. ?PROM bilateral shoulder flexion, scaption, abduction, ER ? ?01/12/2022 ?THERAPEUTIC  EXERCISES ?Pulleys x 2 min ea for flexion, scaption, and abduction to improve ROM. ?Standing lat stretch x 3 ?Supine AROM bilateral shoulder flexion, scaption and horizontal abduction ?MANUAL ?soft tissue mobilization to bilateral pectorals, Lats and UT in supine.to decrease tissue tension. ?PROM bilateral shoulder flexion, scaption, abduction, ER ? ? ?01/07/2022 ?MANUAL;soft tissue mobilization to bilateral pectorals, Lats and UT in supine.to decrease tissue tension. ?PROM bilateral shoulder flexion, scaption, abduction, ER ?THERAPEUTIC EXERCISE; Supine bilateral AROM flexion, scaption, horizontal abduction.x5 ?Supine LTR bilaterally with arms outstretched and  goal post arms x3 ? ? ?01/04/2022 ?MANUAL;soft tissue mobilization to bilateral pectorals, Lats in supine, and in SL to UT and scapular area to decrease tissue tension. ?PROM bilateral shoulder flexion, s

## 2022-01-15 ENCOUNTER — Ambulatory Visit
Admission: RE | Admit: 2022-01-15 | Discharge: 2022-01-15 | Disposition: A | Discharge status: Home / Self Care | Payer: No Typology Code available for payment source | Source: Ambulatory Visit | Attending: Radiation Oncology | Admitting: Radiation Oncology

## 2022-01-15 ENCOUNTER — Other Ambulatory Visit: Payer: Self-pay

## 2022-01-15 DIAGNOSIS — Z170 Estrogen receptor positive status [ER+]: Secondary | ICD-10-CM | POA: Diagnosis not present

## 2022-01-15 DIAGNOSIS — Z510 Encounter for antineoplastic radiation therapy: Secondary | ICD-10-CM | POA: Insufficient documentation

## 2022-01-15 DIAGNOSIS — C50412 Malignant neoplasm of upper-outer quadrant of left female breast: Secondary | ICD-10-CM | POA: Insufficient documentation

## 2022-01-15 LAB — PREGNANCY, URINE: Preg Test, Ur: NEGATIVE

## 2022-01-18 ENCOUNTER — Telehealth: Payer: Self-pay

## 2022-01-18 NOTE — Telephone Encounter (Signed)
Received call from patient that she has decided she would like to proceed with radiation. Confirmed appointment time for Wednesday still worked for her schedule. She knows she can call me directly or respond to previous MyChart message should she have any other questions/concerns before Wednesday's appointment.  ? ?Email sent with update to Dr. Isidore Moos and L3 therapists ?

## 2022-01-19 DIAGNOSIS — C50412 Malignant neoplasm of upper-outer quadrant of left female breast: Secondary | ICD-10-CM | POA: Diagnosis not present

## 2022-01-20 ENCOUNTER — Other Ambulatory Visit: Payer: Self-pay

## 2022-01-20 ENCOUNTER — Ambulatory Visit
Admission: RE | Admit: 2022-01-20 | Discharge: 2022-01-20 | Disposition: A | Discharge status: Home / Self Care | Payer: No Typology Code available for payment source | Source: Ambulatory Visit | Attending: Radiation Oncology | Admitting: Radiation Oncology

## 2022-01-20 DIAGNOSIS — C50412 Malignant neoplasm of upper-outer quadrant of left female breast: Secondary | ICD-10-CM | POA: Diagnosis not present

## 2022-01-20 LAB — RAD ONC ARIA SESSION SUMMARY
Course Elapsed Days: 0
Plan Fractions Treated to Date: 1
Plan Prescribed Dose Per Fraction: 1.8 Gy
Plan Total Fractions Prescribed: 28
Plan Total Prescribed Dose: 50.4 Gy
Reference Point Dosage Given to Date: 1.8 Gy
Reference Point Session Dosage Given: 1.8 Gy
Session Number: 1

## 2022-01-21 ENCOUNTER — Ambulatory Visit: Payer: No Typology Code available for payment source

## 2022-01-21 ENCOUNTER — Other Ambulatory Visit: Payer: Self-pay

## 2022-01-21 ENCOUNTER — Ambulatory Visit
Admission: RE | Admit: 2022-01-21 | Discharge: 2022-01-21 | Disposition: A | Discharge status: Home / Self Care | Payer: No Typology Code available for payment source | Source: Ambulatory Visit | Attending: Radiation Oncology | Admitting: Radiation Oncology

## 2022-01-21 DIAGNOSIS — M25611 Stiffness of right shoulder, not elsewhere classified: Secondary | ICD-10-CM

## 2022-01-21 DIAGNOSIS — Z483 Aftercare following surgery for neoplasm: Secondary | ICD-10-CM

## 2022-01-21 DIAGNOSIS — M25612 Stiffness of left shoulder, not elsewhere classified: Secondary | ICD-10-CM

## 2022-01-21 DIAGNOSIS — C50411 Malignant neoplasm of upper-outer quadrant of right female breast: Secondary | ICD-10-CM

## 2022-01-21 DIAGNOSIS — R293 Abnormal posture: Secondary | ICD-10-CM

## 2022-01-21 DIAGNOSIS — C50412 Malignant neoplasm of upper-outer quadrant of left female breast: Secondary | ICD-10-CM | POA: Diagnosis not present

## 2022-01-21 LAB — RAD ONC ARIA SESSION SUMMARY
Course Elapsed Days: 1
Plan Fractions Treated to Date: 2
Plan Prescribed Dose Per Fraction: 1.8 Gy
Plan Total Fractions Prescribed: 28
Plan Total Prescribed Dose: 50.4 Gy
Reference Point Dosage Given to Date: 3.6 Gy
Reference Point Session Dosage Given: 1.8 Gy
Session Number: 2

## 2022-01-21 NOTE — Therapy (Signed)
?OUTPATIENT PHYSICAL THERAPY TREATMENT NOTE ? ? ?Patient Name: Ebony Lamb ?MRN: [id number] ?DOB:Nov 01, 1975, 46 y.o., female ?Today's Date: 01/21/2022 ? ?PCP: Antony Contras, MD ?REFERRING PROVIDER: Coralie Keens, MD ? ?END OF SESSION:  ? PT End of Session - 01/21/22 0806   ? ? Visit Number 7   ? Number of Visits 10   ? Date for PT Re-Evaluation 01/28/22   ? PT Start Time [id number]   ? PT Stop Time 0853   ? PT Time Calculation (min) 49 min   ? Activity Tolerance Patient tolerated treatment well   ? Behavior During Therapy [facility] for tasks assessed/performed   ? ?  ?  ? ?  ? ? ? ?Past Medical History:  ?Diagnosis Date  ? Abnormal Pap smear 09/13/2000  ? Allergy 02/2020  ? Seasonal  ? Breast cancer ([place])   ? Depression   ? history of depression- denies currently  ? Hypertension   ? Preterm labor   ? ?Past Surgical History:  ?Procedure Laterality Date  ? BREAST BIOPSY Right 07/27/2019  ? BREAST RECONSTRUCTION WITH PLACEMENT OF TISSUE EXPANDER AND ALLODERM Bilateral 12/07/2021  ? Procedure: BREAST RECONSTRUCTION WITH PLACEMENT OF TISSUE EXPANDER AND ALLODERM;  Surgeon: Irene Limbo, MD;  Location: [place];  Service: Plastics;  Laterality: Bilateral;  ? CESAREAN SECTION  11/14/2011  ? Procedure: CESAREAN SECTION;  Surgeon: Eli Hose, MD;  Location: [facility];  Service: Gynecology;  Laterality: N/A;  Primary cesarean section with delivery of baby boy at [phone]. Apgars 9/9.  ? CESAREAN SECTION N/A 03/22/2014  ? Procedure: Repeat CESAREAN SECTION;  Surgeon: Alwyn Pea, MD;  Location: [facility] [facility];  Service: Obstetrics;  Laterality: N/A;  ? COSMETIC SURGERY  11/22/2017  ? Abdominoplasty  ? FOOT SURGERY    ? FRACTURE SURGERY  08/1999  ? Repair of 5th metatarsal in left foot  ? HERNIA REPAIR  11/22/2017  ? NIPPLE SPARING MASTECTOMY Left 12/07/2021  ? Procedure: LEFT NIPPLE SPARING MASTECTOMY;  Surgeon: Rolm Bookbinder, MD;  Location: [place];  Service: General;  Laterality: Left;  ? NIPPLE  SPARING MASTECTOMY WITH SENTINEL LYMPH NODE BIOPSY Right 12/07/2021  ? Procedure: RIGHT NIPPLE SPARING MASTECTOMY WITH AXILLARY SENTINEL LYMPH NODE BIOPSY;  Surgeon: Rolm Bookbinder, MD;  Location: [place];  Service: General;  Laterality: Right;  ? PORTACATH PLACEMENT N/A 06/24/2021  ? Procedure: INSERTION PORT-A-CATH;  Surgeon: Coralie Keens, MD;  Location: [place];  Service: General;  Laterality: N/A;  ? TUBAL LIGATION  11/22/2017  ? Tubes were completely removed  ? WISDOM TOOTH EXTRACTION    ? ?Patient Active Problem List  ? Diagnosis Date Noted  ? Breast cancer, right ([place]) 12/07/2021  ? Port-A-Cath in place 08/10/2021  ? Genetic testing 06/17/2021  ? Family history of breast cancer 06/10/2021  ? Family history of ovarian cancer 06/10/2021  ? Family history of BRCA gene mutation 06/10/2021  ? Malignant neoplasm of upper-outer quadrant of right breast in female, estrogen receptor positive ([place]) 06/08/2021  ? 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 Cesarean delivery delivered 03/22/2014  ? Pregnancy induced hypertension--with 1st pregnancy 12/27/2011  ? History of irregular menstrual bleeding 11/12/2011  ? History of eating disorder 11/12/2011  ? History of cardiac arrhythmia - PAC's - cardiac work-up normal 11/12/2011  ? ? ?REFERRING DIAG: Right breast Cancer ? ?THERAPY DIAG:  ?Aftercare following surgery for neoplasm ? ?Malignant neoplasm of upper-outer quadrant of right breast in female,  estrogen receptor positive ([place]) ? ?Abnormal posture ? ?Stiffness of right shoulder, not elsewhere classified ? ?Stiffness of left shoulder, not elsewhere classified ? 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 She underwent neoadjuvant chemotherapy and then had bilateral mastectomies with a right sentinel node biopsy (4 negative nodes) on 12/07/2021. It is functionally triple negative  with a Ki67 of 70%.  ?  ? ?PRECAUTIONS: Recent Surgery, right UE Lymphedema risk ?Had my first radiation treatment yesterday. It went fine.  I have radiation today at 10:00. Left knee is still a little sore and right knee pain is almost gone. I didn't get my exercises in as much over the last few days because [place] were at the beach. ? ?PAIN:  ?Are you having pain? 2/10 intermittent ?PAIN:  ?Are you having pain? Yes ?NPRS scale: 2/10 ?Pain location: chest ?Pain orientation: Bilateral  ?PAIN TYPE: sharp, tight ?Pain description: intermittent  ?Aggravating factors:  ?Relieving factors: changing position ? ? ? ?OBJECTIVE: (objective measures completed at initial evaluation unless otherwise dated) ? ? ? ? ?OBSERVATIONS: ?           Breast incisions appear to be well healed. Her left nipple has some necrotic black tissue present. This is being addressed by Dr. Iran Planas and she sees Dr. Donne Hazel next week. No swelling present and no redness.  ?  ?POSTURE:  ?Forward head and rounded shoulders posture ?  ?                                                                                       ? 06/10/22 12/31/21 01/14/22  ? Right Shoulder Extension 57 Degrees 54             63  ?Right Shoulder Flexion 166 Degrees  125           150  ?Right Shoulder ABduction 168 Degrees  141            160  ?Right Shoulder Internal Rotation 75 Degrees  88   ?Right Shoulder External Rotation 90 Degrees  62             100  ?Left Shoulder Extension 60 Degrees  47              55  ?Left Shoulder Flexion 155 Degrees  142              157  ?Left Shoulder ABduction 175 Degrees  157              170  ?Left Shoulder Internal Rotation 73 Degrees  76   ?Left Shoulder External Rotation 85 Degrees  70                95  ?  ?     ?Right Upper Extremity Lymphedema 06/10/2022 12/31/2021  ?  10 cm Proximal to Olecranon Process 24 cm  22.8  ?  Olecranon Process 23.4 cm  22.4  ?  10 cm Proximal to Ulnar Styloid Process 19 cm  17.8  ?  Just Proximal to Ulnar Styloid  Process 14.8 cm  14.5  ?  Across Hand at [employer] 17.1 cm  17.5  ?  At [facility] of 2nd Digit 5.9 cm  5.9  ?         ?  Left Upper Extremity Lymphedema    ?  10 cm Proximal to Olecranon Process 23.7 cm  22.3  ?  Olecranon Process 22.8 cm  22.2  ?  10 cm Proximal to Ulnar Styloid Process 18.3 cm  17.7  ?  Just Proximal to Ulnar Styloid Process 14.2 cm  13.9  ?  Across Hand at [employer] 18.2 cm  17.8  ?  At [facility] of 2nd Digit 5.7 cm  5.7  ?  ?  ?             ?Surgery type/Date: 12/07/2021 ?Number of lymph nodes removed: 4 ?Current/past treatment (chemo, radiation, hormone therapy): neoadjuvant chemotherapy ?Other symptoms:  ?Heaviness/tightness Yes ?Pain Yes ?Pitting edema No ?Infections No ?Decreased scar mobility Yes ?Stemmer sign No ?  ?  ?PATIENT EDUCATION:  ?Education details:supine scapular series except sword ?Person educated: Patient ?Education method: Explanation, Demonstration, and Handouts ?Education comprehension: verbalized understanding and returned demonstration ? ? TREATMENT TODAY ?01/21/2022 ?Pulleys x 2 min flexion, scaption, abduction ?Ball rolls flexion x 10, bilateral abduction x 5 ?Held STW secondary to radiation following rx ?PROM bilateral shoulders in supine flex, scaption, abduction, ER ?THERAPEUTIC EXERCISE: supine scapular series; flexion, horizontal abduction, and bilateral ER x 5 ea with yellow band ? ? ? ?01/14/2022 ?ThERAPEUTIC EXERCISES; ?Pulleys x 2 min flex and abd, 1 min for scaption ?Ball rolls for flexion x 10, ?MANUAL ?soft tissue mobilization to bilateral pectorals, Lats and UT in supine.to decrease tissue tension. ?PROM bilateral shoulder flexion, scaption, abduction, ER ? ?01/12/2022 ?THERAPEUTIC  EXERCISES ?Pulleys x 2 min ea for flexion, scaption, and abduction to improve ROM. ?Standing lat stretch x 3 ?Supine AROM bilateral shoulder flexion, scaption and horizontal abduction ?MANUAL ?soft tissue mobilization to bilateral pectorals, Lats and UT in supine.to decrease tissue  tension. ?PROM bilateral shoulder flexion, scaption, abduction, ER ? ? ?01/07/2022 ?MANUAL;soft tissue mobilization to bilateral pectorals, Lats and UT in supine.to decrease tissue tension. ?PROM bilatera

## 2022-01-21 NOTE — Patient Instructions (Signed)

## 2022-01-22 ENCOUNTER — Encounter: Payer: Self-pay | Admitting: Rehabilitation

## 2022-01-22 ENCOUNTER — Ambulatory Visit
Admission: RE | Admit: 2022-01-22 | Discharge: 2022-01-22 | Disposition: A | Discharge status: Home / Self Care | Payer: No Typology Code available for payment source | Source: Ambulatory Visit | Attending: Radiation Oncology | Admitting: Radiation Oncology

## 2022-01-22 ENCOUNTER — Other Ambulatory Visit: Payer: Self-pay

## 2022-01-22 ENCOUNTER — Ambulatory Visit: Payer: No Typology Code available for payment source | Admitting: Rehabilitation

## 2022-01-22 DIAGNOSIS — R293 Abnormal posture: Secondary | ICD-10-CM

## 2022-01-22 DIAGNOSIS — Z483 Aftercare following surgery for neoplasm: Secondary | ICD-10-CM

## 2022-01-22 DIAGNOSIS — C50412 Malignant neoplasm of upper-outer quadrant of left female breast: Secondary | ICD-10-CM | POA: Diagnosis not present

## 2022-01-22 DIAGNOSIS — C50411 Malignant neoplasm of upper-outer quadrant of right female breast: Secondary | ICD-10-CM

## 2022-01-22 DIAGNOSIS — M25612 Stiffness of left shoulder, not elsewhere classified: Secondary | ICD-10-CM

## 2022-01-22 DIAGNOSIS — M25611 Stiffness of right shoulder, not elsewhere classified: Secondary | ICD-10-CM

## 2022-01-22 LAB — RAD ONC ARIA SESSION SUMMARY
Course Elapsed Days: 2
Plan Fractions Treated to Date: 3
Plan Prescribed Dose Per Fraction: 1.8 Gy
Plan Total Fractions Prescribed: 28
Plan Total Prescribed Dose: 50.4 Gy
Reference Point Dosage Given to Date: 5.4 Gy
Reference Point Session Dosage Given: 1.8 Gy
Session Number: 3

## 2022-01-22 NOTE — Therapy (Signed)
?OUTPATIENT PHYSICAL THERAPY TREATMENT NOTE ? ? ?Patient Name: Ebony Lamb ?MRN: [id number] ?DOB:[birth year], 46 y.o., female ?Today's Date: 01/22/2022 ? ?PCP: Antony Contras, MD ?REFERRING PROVIDER: Coralie Keens, MD ? ?END OF SESSION:  ? PT End of Session - 01/22/22 0300   ? ? Visit Number 8   ? Number of Visits 10   ? Date for PT Re-Evaluation 01/28/22   ? PT Start Time [phone]   arrived late  ? PT Stop Time 0845   ? PT Time Calculation (min) 35 min   ? Activity Tolerance Patient tolerated treatment well   ? Behavior During Therapy [facility] for tasks assessed/performed   ? ?  ?  ? ?  ? ? ? ? ?Past Medical History:  ?Diagnosis Date  ? Abnormal Pap smear 09/13/2000  ? Allergy 02/2020  ? Seasonal  ? Breast cancer ([place])   ? Depression   ? history of depression- denies currently  ? Hypertension   ? Preterm labor   ? ?Past Surgical History:  ?Procedure Laterality Date  ? BREAST BIOPSY Right 07/27/2019  ? BREAST RECONSTRUCTION WITH PLACEMENT OF TISSUE EXPANDER AND ALLODERM Bilateral 12/07/2021  ? Procedure: BREAST RECONSTRUCTION WITH PLACEMENT OF TISSUE EXPANDER AND ALLODERM;  Surgeon: Irene Limbo, MD;  Location: [place];  Service: Plastics;  Laterality: Bilateral;  ? CESAREAN SECTION  11/14/2011  ? Procedure: CESAREAN SECTION;  Surgeon: Eli Hose, MD;  Location: [facility];  Service: Gynecology;  Laterality: N/A;  Primary cesarean section with delivery of baby boy at [phone]. Apgars 9/9.  ? CESAREAN SECTION N/A 03/22/2014  ? Procedure: Repeat CESAREAN SECTION;  Surgeon: Alwyn Pea, MD;  Location: [facility];  Service: Obstetrics;  Laterality: N/A;  ? COSMETIC SURGERY  11/22/2017  ? Abdominoplasty  ? FOOT SURGERY    ? FRACTURE SURGERY  08/1999  ? Repair of 5th metatarsal in left foot  ? HERNIA REPAIR  11/22/2017  ? NIPPLE SPARING MASTECTOMY Left 12/07/2021  ? Procedure: LEFT NIPPLE SPARING MASTECTOMY;  Surgeon: Rolm Bookbinder, MD;  Location: [place];  Service: General;  Laterality:  Left;  ? NIPPLE SPARING MASTECTOMY WITH SENTINEL LYMPH NODE BIOPSY Right 12/07/2021  ? Procedure: RIGHT NIPPLE SPARING MASTECTOMY WITH AXILLARY SENTINEL LYMPH NODE BIOPSY;  Surgeon: Rolm Bookbinder, MD;  Location: [place];  Service: General;  Laterality: Right;  ? PORTACATH PLACEMENT N/A 06/24/2021  ? Procedure: INSERTION PORT-A-CATH;  Surgeon: Coralie Keens, MD;  Location: [place];  Service: General;  Laterality: N/A;  ? TUBAL LIGATION  11/22/2017  ? Tubes were completely removed  ? WISDOM TOOTH EXTRACTION    ? ?Patient Active Problem List  ? Diagnosis Date Noted  ? Breast cancer, right ([place]) 12/07/2021  ? Port-A-Cath in place 08/10/2021  ? Genetic testing 06/17/2021  ? Family history of breast cancer 06/10/2021  ? Family history of ovarian cancer 06/10/2021  ? Family history of BRCA gene mutation 06/10/2021  ? Malignant neoplasm of upper-outer quadrant of right breast in female, estrogen receptor positive ([place]) 06/08/2021  ? 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 Cesarean delivery delivered 03/22/2014  ? Pregnancy induced hypertension--with 1st pregnancy 12/27/2011  ? History of irregular menstrual bleeding 11/12/2011  ? History of eating disorder 11/12/2011  ? History of cardiac arrhythmia - PAC's - cardiac work-up normal 11/12/2011  ? ? ?REFERRING DIAG: Right breast Cancer ? ?THERAPY DIAG:  ?Aftercare following surgery for neoplasm ? ?Malignant neoplasm of upper-outer quadrant of  right breast in female, estrogen receptor positive ([place]) ? ?Abnormal posture ? ?Stiffness of right shoulder, not elsewhere classified ? ?Stiffness of left shoulder, not elsewhere classified ? 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 She underwent neoadjuvant chemotherapy and then had bilateral mastectomies with a right sentinel node biopsy (4 negative nodes) on 12/07/2021. It is functionally  triple negative with a Ki67 of 70%.  ?  ? ?PRECAUTIONS: Recent Surgery, right UE Lymphedema risk ? ? ?SUBJECTIVE:  ? ?PAIN:  ?Are you having pain? 1/10 constant pain ? ?OBJECTIVE: (objective measures completed at initial evaluation unless otherwise dated) ? ? ?OBSERVATIONS: ?Breast incisions appear to be well healed. Her left nipple has some necrotic black tissue present. This is being addressed by Dr. Iran Planas and she sees Dr. Donne Hazel next week. No swelling present and no redness.  ?  ?POSTURE:  ?Forward head and rounded shoulders posture ?  ?                                                                                       ? 06/10/22 12/31/21 01/14/22   ? Right Shoulder Extension 57 Degrees 54             63   ?Right Shoulder Flexion 166 Degrees  125           150   ?Right Shoulder ABduction 168 Degrees  141            160   ?Right Shoulder Internal Rotation 75 Degrees  88    ?Right Shoulder External Rotation 90 Degrees  62             100   ?Left Shoulder Extension 60 Degrees  47              55   ?Left Shoulder Flexion 155 Degrees  142              157   ?Left Shoulder ABduction 175 Degrees  157              170   ?Left Shoulder Internal Rotation 73 Degrees  76    ?Left Shoulder External Rotation 85 Degrees  70                95   ?  ?     ?Right Upper Extremity Lymphedema 06/10/2022 12/31/2021  ?  10 cm Proximal to Olecranon Process 24 cm  22.8  ?  Olecranon Process 23.4 cm  22.4  ?  10 cm Proximal to Ulnar Styloid Process 19 cm  17.8  ?  Just Proximal to Ulnar Styloid Process 14.8 cm  14.5  ?  Across Hand at [employer] 17.1 cm  17.5  ?  At [facility] of 2nd Digit 5.9 cm  5.9  ?         ?  Left Upper Extremity Lymphedema    ?  10 cm Proximal to Olecranon Process 23.7 cm  22.3  ?  Olecranon Process 22.8 cm  22.2  ?  10 cm Proximal to Ulnar Styloid Process 18.3 cm  17.7  ?  Just Proximal to Ulnar Styloid Process 14.2 cm  13.9  ?  Across Hand at [employer] 18.2 cm  17.8  ?  At [facility] of 2nd Digit 5.7 cm  5.7  ?   ?  ?             ?Surgery type/Date: 12/07/2021 ?Number of lymph nodes removed: 4 ?Current/past treatment (chemo, radiation, hormone therapy): neoadjuvant chemotherapy ?Other symptoms:  ?Heaviness/tightness Yes ?Pain Yes ?Pitting edema No ?Infections No ?Decreased scar mobility Yes ?Stemmer sign No ?  ?  ?PATIENT EDUCATION:  ?Education details:supine scapular series except sword ?Person educated: Patient ?Education method: Explanation, Demonstration, and Handouts ?Education comprehension: verbalized understanding and returned demonstration ? ? TREATMENT TODAY ?01/22/22 ?Pulleys x 3 min flexion, abduction ?Ball rolls flexion x 10, bilateral abduction x 5 ?Held STW secondary to radiation following rx ?PROM bilateral shoulders in supine flex, scaption, abduction, ER ?THERAPEUTIC EXERCISE: supine scapular series; flexion, horizontal abduction, and bilateral ER x 10 ea with yellow band ? ?01/21/2022 ?Pulleys x 2 min flexion, scaption, abduction ?Ball rolls flexion x 10, bilateral abduction x 5 ?Held STW secondary to radiation following rx ?PROM bilateral shoulders in supine flex, scaption, abduction, ER ?THERAPEUTIC EXERCISE: supine scapular series; flexion, horizontal abduction, and bilateral ER x 5 ea with yellow band ? ? ?01/14/2022 ?ThERAPEUTIC EXERCISES; ?Pulleys x 2 min flex and abd, 1 min for scaption ?Ball rolls for flexion x 10, ?MANUAL ?soft tissue mobilization to bilateral pectorals, Lats and UT in supine.to decrease tissue tension. ?PROM bilateral shoulder flexion, scaption, abduction, ER ? ?  ?HOME EXERCISE PROGRAM: ?Reviewed previously given post op HEP. ?  ?  ?ASSESSMENT: ?  ?CLINICAL IMPRESSION: ?Continued AA exs and PROM but held STM with cocoa butter secondary to radiation appt this am. Increased reps to 10.  Pt has excellent PROM and AROM today so we discussed how she can continue with PT sessions as wanted at this point.  She does enjoy coming.  We also discussed how we can do more when appts are after  radiation compared to before.   ?  ?Pt will benefit from skilled therapeutic intervention to improve on the following deficits: Decreased knowledge of precautions, impaired UE functional use, pain, decre

## 2022-01-25 ENCOUNTER — Other Ambulatory Visit: Payer: Self-pay

## 2022-01-25 ENCOUNTER — Ambulatory Visit
Admission: RE | Admit: 2022-01-25 | Discharge: 2022-01-25 | Disposition: A | Discharge status: Home / Self Care | Payer: No Typology Code available for payment source | Source: Ambulatory Visit | Attending: Radiation Oncology | Admitting: Radiation Oncology

## 2022-01-25 ENCOUNTER — Ambulatory Visit: Payer: No Typology Code available for payment source

## 2022-01-25 DIAGNOSIS — Z483 Aftercare following surgery for neoplasm: Secondary | ICD-10-CM | POA: Diagnosis not present

## 2022-01-25 DIAGNOSIS — M25611 Stiffness of right shoulder, not elsewhere classified: Secondary | ICD-10-CM

## 2022-01-25 DIAGNOSIS — Z170 Estrogen receptor positive status [ER+]: Secondary | ICD-10-CM

## 2022-01-25 DIAGNOSIS — C50412 Malignant neoplasm of upper-outer quadrant of left female breast: Secondary | ICD-10-CM | POA: Diagnosis not present

## 2022-01-25 DIAGNOSIS — M25612 Stiffness of left shoulder, not elsewhere classified: Secondary | ICD-10-CM

## 2022-01-25 DIAGNOSIS — R293 Abnormal posture: Secondary | ICD-10-CM

## 2022-01-25 LAB — RAD ONC ARIA SESSION SUMMARY
Course Elapsed Days: 5
Plan Fractions Treated to Date: 4
Plan Prescribed Dose Per Fraction: 1.8 Gy
Plan Total Fractions Prescribed: 28
Plan Total Prescribed Dose: 50.4 Gy
Reference Point Dosage Given to Date: 7.2 Gy
Reference Point Session Dosage Given: 1.8 Gy
Session Number: 4

## 2022-01-25 MED ORDER — RADIAPLEXRX EX GEL: Freq: Once | CUTANEOUS | Status: AC

## 2022-01-25 MED ORDER — ALRA NON-METALLIC DEODORANT (RAD-ONC)
1.0000 "application " | Freq: Once | TOPICAL | Status: AC
  Administered 2022-01-25: 1 via TOPICAL

## 2022-01-25 NOTE — Progress Notes (Signed)
Pt here for patient teaching.    Pt given Radiation and You booklet, skin care instructions, Alra deodorant, and Radiaplex gel.    Reviewed areas of pertinence such as fatigue, hair loss in treatment field, skin changes, breast tenderness, and breast swelling .   Pt able to give teach back of to pat skin, use unscented/gentle soap, and drink plenty of water,apply Radiaplex bid, avoid applying anything to skin within 4 hours of treatment, avoid wearing an under wire bra, and to use an electric razor if they must shave.   Pt demonstrated understanding and verbalizes understanding of information given and will contact nursing with any questions or concerns.    [email or web address]/treatmentinformation/whattoexpect/index         

## 2022-01-25 NOTE — Therapy (Signed)
?OUTPATIENT PHYSICAL THERAPY TREATMENT NOTE ? ? ?Patient Name: Ebony Lamb ?MRN: [id number] ?DOB:May 02, 1976, 46 y.o., female ?Today's Date: 01/25/2022 ? ?PCP: Antony Contras, MD ?REFERRING PROVIDER: Coralie Keens, MD ? ?END OF SESSION:  ? PT End of Session - 01/25/22 0908   ? ? Visit Number 9   ? Number of Visits 10   ? Date for PT Re-Evaluation 01/28/22   ? PT Start Time [phone]   ? PT Stop Time 0955   ? PT Time Calculation (min) 50 min   ? Activity Tolerance Patient tolerated treatment well   ? Behavior During Therapy [facility] for tasks assessed/performed   ? ?  ?  ? ?  ? ? ? ? ? ?Past Medical History:  ?Diagnosis Date  ? Abnormal Pap smear 09/13/2000  ? Allergy 02/2020  ? Seasonal  ? Breast cancer ([place])   ? Depression   ? history of depression- denies currently  ? Hypertension   ? Preterm labor   ? ?Past Surgical History:  ?Procedure Laterality Date  ? BREAST BIOPSY Right 07/27/2019  ? BREAST RECONSTRUCTION WITH PLACEMENT OF TISSUE EXPANDER AND ALLODERM Bilateral 12/07/2021  ? Procedure: BREAST RECONSTRUCTION WITH PLACEMENT OF TISSUE EXPANDER AND ALLODERM;  Surgeon: Irene Limbo, MD;  Location: [place];  Service: Plastics;  Laterality: Bilateral;  ? CESAREAN SECTION  11/14/2011  ? Procedure: CESAREAN SECTION;  Surgeon: Eli Hose, MD;  Location: [facility];  Service: Gynecology;  Laterality: N/A;  Primary cesarean section with delivery of baby boy at [phone]. Apgars 9/9.  ? CESAREAN SECTION N/A 03/22/2014  ? Procedure: Repeat CESAREAN SECTION;  Surgeon: Alwyn Pea, MD;  Location: [facility];  Service: Obstetrics;  Laterality: N/A;  ? COSMETIC SURGERY  11/22/2017  ? Abdominoplasty  ? FOOT SURGERY    ? FRACTURE SURGERY  08/1999  ? Repair of 5th metatarsal in left foot  ? HERNIA REPAIR  11/22/2017  ? NIPPLE SPARING MASTECTOMY Left 12/07/2021  ? Procedure: LEFT NIPPLE SPARING MASTECTOMY;  Surgeon: Rolm Bookbinder, MD;  Location: [place];  Service: General;  Laterality: Left;  ? NIPPLE  SPARING MASTECTOMY WITH SENTINEL LYMPH NODE BIOPSY Right 12/07/2021  ? Procedure: RIGHT NIPPLE SPARING MASTECTOMY WITH AXILLARY SENTINEL LYMPH NODE BIOPSY;  Surgeon: Rolm Bookbinder, MD;  Location: [place];  Service: General;  Laterality: Right;  ? PORTACATH PLACEMENT N/A 06/24/2021  ? Procedure: INSERTION PORT-A-CATH;  Surgeon: Coralie Keens, MD;  Location: [place];  Service: General;  Laterality: N/A;  ? TUBAL LIGATION  11/22/2017  ? Tubes were completely removed  ? WISDOM TOOTH EXTRACTION    ? ?Patient Active Problem List  ? Diagnosis Date Noted  ? Breast cancer, right ([place]) 12/07/2021  ? Port-A-Cath in place 08/10/2021  ? Genetic testing 06/17/2021  ? Family history of breast cancer 06/10/2021  ? Family history of ovarian cancer 06/10/2021  ? Family history of BRCA gene mutation 06/10/2021  ? Malignant neoplasm of upper-outer quadrant of right breast in female, estrogen receptor positive ([place]) 06/08/2021  ? 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 Cesarean delivery delivered 03/22/2014  ? Pregnancy induced hypertension--with 1st pregnancy 12/27/2011  ? History of irregular menstrual bleeding 11/12/2011  ? History of eating disorder 11/12/2011  ? History of cardiac arrhythmia - PAC's - cardiac work-up normal 11/12/2011  ? ? ?REFERRING DIAG: Right breast Cancer ? ?THERAPY DIAG:  ?Aftercare following surgery for neoplasm ? ?Malignant neoplasm of upper-outer quadrant of right breast  in female, estrogen receptor positive ([place]) ? ?Abnormal posture ? ?Stiffness of right shoulder, not elsewhere classified ? ?Stiffness of left shoulder, not elsewhere classified ? 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 She underwent neoadjuvant chemotherapy and then had bilateral mastectomies with a right sentinel node biopsy (4 negative nodes) on 12/07/2021. It is functionally triple negative  with a Ki67 of 70%.  ?  ? ?PRECAUTIONS: Recent Surgery, right UE Lymphedema risk ? ? ?SUBJECTIVE:  ?My knee pain has gotten better and I can walk more. Right knee is still a little painful. I can reach more now. I can reach now with both arms and dress easier. I am going to get a sleeve and gauntlet and Inez Catalina gave me all the measurements so I can order it. ? ? ?PAIN:  ?Are you having pain? No constant pain, intermittent shooting pain in right breast ? ?OBJECTIVE: (objective measures completed at initial evaluation unless otherwise dated) ? ? ?OBSERVATIONS: ?Breast incisions appear to be well healed. Her left nipple has some necrotic black tissue present. This is being addressed by Dr. Iran Planas and she sees Dr. Donne Hazel next week. No swelling present and no redness.  ?  ?POSTURE:  ?Forward head and rounded shoulders posture ?  ?                                                                                       ? 06/10/22 12/31/21 01/14/22   ? Right Shoulder Extension 57 Degrees 54             63   ?Right Shoulder Flexion 166 Degrees  125           150   ?Right Shoulder ABduction 168 Degrees  141            160   ?Right Shoulder Internal Rotation 75 Degrees  88    ?Right Shoulder External Rotation 90 Degrees  62             100   ?Left Shoulder Extension 60 Degrees  47              55   ?Left Shoulder Flexion 155 Degrees  142              157   ?Left Shoulder ABduction 175 Degrees  157              170   ?Left Shoulder Internal Rotation 73 Degrees  76    ?Left Shoulder External Rotation 85 Degrees  70                95   ?  ?     ?Right Upper Extremity Lymphedema 06/10/2022 12/31/2021  ?  10 cm Proximal to Olecranon Process 24 cm  22.8  ?  Olecranon Process 23.4 cm  22.4  ?  10 cm Proximal to Ulnar Styloid Process 19 cm  17.8  ?  Just Proximal to Ulnar Styloid Process 14.8 cm  14.5  ?  Across Hand at [employer] 17.1 cm  17.5  ?  At [place] of 2nd  Digit 5.9 cm  5.9  ?         ?  Left Upper Extremity Lymphedema    ?   10 cm Proximal to Olecranon Process 23.7 cm  22.3  ?  Olecranon Process 22.8 cm  22.2  ?  10 cm Proximal to Ulnar Styloid Process 18.3 cm  17.7  ?  Just Proximal to Ulnar Styloid Process 14.2 cm  13.9  ?  Across Hand at [employer] 18.2 cm  17.8  ?  At [facility] of 2nd Digit 5.7 cm  5.7  ?  ?  ?             ?Surgery type/Date: 12/07/2021 ?Number of lymph nodes removed: 4 ?Current/past treatment (chemo, radiation, hormone therapy): neoadjuvant chemotherapy ?Other symptoms:  ?Heaviness/tightness Yes ?Pain Yes ?Pitting edema No ?Infections No ?Decreased scar mobility Yes ?Stemmer sign No ?  ?  ?PATIENT EDUCATION:  ?Education details:supine scapular series except sword ?Person educated: Patient ?Education method: Explanation, Demonstration, and Handouts ?Education comprehension: verbalized understanding and returned demonstration ? ? TREATMENT TODAY ? ?01/25/2022 ?Pulleys x 2 min flexion and abduction ?Ball rolls on wall flexion x 10, ?Standing jobes flexion, scaption, abd x 10 with back against wall ?Theraband exs: standing scapular retraction, shoulder extension x 10, ER x 10 all with yellow band ? ? ?01/22/22 ?Pulleys x 3 min flexion, abduction ?Ball rolls flexion x 10, bilateral abduction x 5 ?Held STW secondary to radiation following rx ?PROM bilateral shoulders in supine flex, scaption, abduction, ER ?THERAPEUTIC EXERCISE: supine scapular series; flexion, horizontal abduction, and bilateral ER x 10 ea with yellow band ? ?01/21/2022 ?Pulleys x 2 min flexion, scaption, abduction ?Ball rolls flexion x 10, bilateral abduction x 5 ?Held STW secondary to radiation following rx ?PROM bilateral shoulders in supine flex, scaption, abduction, ER ?THERAPEUTIC EXERCISE: supine scapular series; flexion, horizontal abduction, and bilateral ER x 5 ea with yellow band ?Updated HEP and discussed household chores. Pt completed quick dash ? ?01/14/2022 ?ThERAPEUTIC EXERCISES; ?Pulleys x 2 min flex and abd, 1 min for scaption ?Ball rolls  for flexion x 10, ?MANUAL ?soft tissue mobilization to bilateral pectorals, Lats and UT in supine.to decrease tissue tension. ?PROM bilateral shoulder flexion, scaption, abduction, ER ? ?  ?HOME EXERCI

## 2022-01-26 ENCOUNTER — Other Ambulatory Visit: Payer: Self-pay

## 2022-01-26 ENCOUNTER — Ambulatory Visit
Admission: RE | Admit: 2022-01-26 | Discharge: 2022-01-26 | Disposition: A | Discharge status: Home / Self Care | Payer: No Typology Code available for payment source | Source: Ambulatory Visit | Attending: Radiation Oncology | Admitting: Radiation Oncology

## 2022-01-26 DIAGNOSIS — C50412 Malignant neoplasm of upper-outer quadrant of left female breast: Secondary | ICD-10-CM | POA: Diagnosis not present

## 2022-01-26 LAB — RAD ONC ARIA SESSION SUMMARY
Course Elapsed Days: 6
Plan Fractions Treated to Date: 5
Plan Prescribed Dose Per Fraction: 1.8 Gy
Plan Total Fractions Prescribed: 28
Plan Total Prescribed Dose: 50.4 Gy
Reference Point Dosage Given to Date: 9 Gy
Reference Point Session Dosage Given: 1.8 Gy
Session Number: 5

## 2022-01-27 ENCOUNTER — Ambulatory Visit
Admission: RE | Admit: 2022-01-27 | Discharge: 2022-01-27 | Disposition: A | Discharge status: Home / Self Care | Payer: No Typology Code available for payment source | Source: Ambulatory Visit | Attending: Radiation Oncology | Admitting: Radiation Oncology

## 2022-01-27 ENCOUNTER — Other Ambulatory Visit: Payer: Self-pay

## 2022-01-27 DIAGNOSIS — C50412 Malignant neoplasm of upper-outer quadrant of left female breast: Secondary | ICD-10-CM | POA: Diagnosis not present

## 2022-01-27 LAB — RAD ONC ARIA SESSION SUMMARY
Course Elapsed Days: 7
Plan Fractions Treated to Date: 6
Plan Prescribed Dose Per Fraction: 1.8 Gy
Plan Total Fractions Prescribed: 28
Plan Total Prescribed Dose: 50.4 Gy
Reference Point Dosage Given to Date: 10.8 Gy
Reference Point Session Dosage Given: 1.8 Gy
Session Number: 6

## 2022-01-28 ENCOUNTER — Other Ambulatory Visit: Payer: Self-pay

## 2022-01-28 ENCOUNTER — Ambulatory Visit: Payer: No Typology Code available for payment source

## 2022-01-28 ENCOUNTER — Ambulatory Visit
Admission: RE | Admit: 2022-01-28 | Discharge: 2022-01-28 | Disposition: A | Discharge status: Home / Self Care | Payer: No Typology Code available for payment source | Source: Ambulatory Visit | Attending: Radiation Oncology | Admitting: Radiation Oncology

## 2022-01-28 DIAGNOSIS — M25611 Stiffness of right shoulder, not elsewhere classified: Secondary | ICD-10-CM

## 2022-01-28 DIAGNOSIS — R293 Abnormal posture: Secondary | ICD-10-CM

## 2022-01-28 DIAGNOSIS — Z483 Aftercare following surgery for neoplasm: Secondary | ICD-10-CM | POA: Diagnosis not present

## 2022-01-28 DIAGNOSIS — Z170 Estrogen receptor positive status [ER+]: Secondary | ICD-10-CM

## 2022-01-28 DIAGNOSIS — C50412 Malignant neoplasm of upper-outer quadrant of left female breast: Secondary | ICD-10-CM | POA: Diagnosis not present

## 2022-01-28 DIAGNOSIS — M25612 Stiffness of left shoulder, not elsewhere classified: Secondary | ICD-10-CM

## 2022-01-28 LAB — RAD ONC ARIA SESSION SUMMARY
Course Elapsed Days: 8
Plan Fractions Treated to Date: 7
Plan Prescribed Dose Per Fraction: 1.8 Gy
Plan Total Fractions Prescribed: 28
Plan Total Prescribed Dose: 50.4 Gy
Reference Point Dosage Given to Date: 12.6 Gy
Reference Point Session Dosage Given: 1.8 Gy
Session Number: 7

## 2022-01-28 NOTE — Therapy (Addendum)
OUTPATIENT PHYSICAL THERAPY TREATMENT NOTE   Patient Name: Ebony Lamb MRN: [id number] DOB:1976/05/11, 46 y.o., female Today's Date: 01/28/2022  PCP: Antony Contras, MD REFERRING PROVIDER: Coralie Keens, MD  END OF SESSION:   PT End of Session - 01/28/22 0813     Visit Number 10    Number of Visits 14    Date for PT Re-Evaluation 02/25/22    PT Start Time 2353   pt late   PT Stop Time 0854    PT Time Calculation (min) 40 min    Activity Tolerance Patient tolerated treatment well    Behavior During Therapy [facility] for tasks assessed/performed                Past Medical History:  Diagnosis Date   Abnormal Pap smear 09/13/2000   Allergy 02/2020   Seasonal   Breast cancer ([place])    Depression    history of depression- denies currently   Hypertension    Preterm labor    Past Surgical History:  Procedure Laterality Date   BREAST BIOPSY Right 07/27/2019   BREAST RECONSTRUCTION WITH PLACEMENT OF TISSUE EXPANDER AND ALLODERM Bilateral 12/07/2021   Procedure: BREAST RECONSTRUCTION WITH PLACEMENT OF TISSUE EXPANDER AND ALLODERM;  Surgeon: Irene Limbo, MD;  Location: [place];  Service: Plastics;  Laterality: Bilateral;   CESAREAN SECTION  11/14/2011   Procedure: CESAREAN SECTION;  Surgeon: Eli Hose, MD;  Location: [facility];  Service: Gynecology;  Laterality: N/A;  Primary cesarean section with delivery of baby boy at [phone]. Apgars 9/9.   CESAREAN SECTION N/A 03/22/2014   Procedure: Repeat CESAREAN SECTION;  Surgeon: Alwyn Pea, MD;  Location: [facility];  Service: Obstetrics;  Laterality: N/A;   COSMETIC SURGERY  11/22/2017   Abdominoplasty   FOOT SURGERY     FRACTURE SURGERY  08/1999   Repair of 5th metatarsal in left foot   HERNIA REPAIR  11/22/2017   NIPPLE SPARING MASTECTOMY Left 12/07/2021   Procedure: LEFT NIPPLE SPARING MASTECTOMY;  Surgeon: Rolm Bookbinder, MD;  Location: [place];  Service: General;  Laterality: Left;    NIPPLE SPARING MASTECTOMY WITH SENTINEL LYMPH NODE BIOPSY Right 12/07/2021   Procedure: RIGHT NIPPLE SPARING MASTECTOMY WITH AXILLARY SENTINEL LYMPH NODE BIOPSY;  Surgeon: Rolm Bookbinder, MD;  Location: [place];  Service: General;  Laterality: Right;   PORTACATH PLACEMENT N/A 06/24/2021   Procedure: INSERTION PORT-A-CATH;  Surgeon: Coralie Keens, MD;  Location: [place];  Service: General;  Laterality: N/A;   TUBAL LIGATION  11/22/2017   Tubes were completely removed   WISDOM TOOTH EXTRACTION     Patient Active Problem List   Diagnosis Date Noted   Breast cancer, right ([place]) 12/07/2021   Port-A-Cath in place 08/10/2021   Genetic testing 06/17/2021   Family history of breast cancer 06/10/2021   Family history of ovarian cancer 06/10/2021   Family history of BRCA gene mutation 06/10/2021   Malignant neoplasm of upper-outer quadrant of right breast in female, estrogen receptor positive ([place]) 06/08/2021   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/25/2014   Cesarean delivery delivered 03/22/2014   Pregnancy induced hypertension--with 1st pregnancy 12/27/2011   History of irregular menstrual bleeding 11/12/2011   History of eating disorder 11/12/2011   History of cardiac arrhythmia - PAC's - cardiac work-up normal 11/12/2011    REFERRING DIAG: Right breast Cancer  THERAPY DIAG:  Aftercare following surgery for neoplasm  Malignant neoplasm of upper-outer quadrant  of right breast in female, estrogen receptor positive ([place])  Abnormal posture  Stiffness of right shoulder, not elsewhere classified  Stiffness of left shoulder, not elsewhere classified  PERTINENT HISTORY: Patient was diagnosed on 06/01/2021 with right grade III invasive ductal carcinoma breast cancer. She underwent neoadjuvant chemotherapy and then had bilateral mastectomies with a right sentinel node biopsy (4 negative nodes) on 12/07/2021. It is functionally  triple negative with a Ki67 of 70%.     PRECAUTIONS: Recent Surgery, right UE Lymphedema risk   SUBJECTIVE: I had a little pain under my armpit on the way here, but its gone now. It may have been radiation related. I was thinking I would like to do 1x per week   PAIN:  Are you having pain? No constant pain, intermittent shooting pain in right breast  OBJECTIVE: (objective measures completed at initial evaluation unless otherwise dated)   OBSERVATIONS: Breast incisions appear to be well healed. Her left nipple has some necrotic black tissue present. This is being addressed by Dr. Iran Planas and she sees Dr. Donne Hazel next week. No swelling present and no redness.    POSTURE:  Forward head and rounded shoulders posture                                                                                           06/10/22 12/31/21 01/14/22 01/28/2022   Right Shoulder Extension 57 Degrees 54             63 68  Right Shoulder Flexion 166 Degrees  125           150 162  Right Shoulder ABduction 168 Degrees  141            160 170  Right Shoulder Internal Rotation 75 Degrees  88    Right Shoulder External Rotation 90 Degrees  62             100 101  Left Shoulder Extension 60 Degrees  47              55 67  Left Shoulder Flexion 155 Degrees  142              157 162  Left Shoulder ABduction 175 Degrees  157              170 177  Left Shoulder Internal Rotation 73 Degrees  76    Left Shoulder External Rotation 85 Degrees  70                95 97         Right Upper Extremity Lymphedema 06/10/2022 12/31/2021    10 cm Proximal to Olecranon Process 24 cm  22.8    Olecranon Process 23.4 cm  22.4    10 cm Proximal to Ulnar Styloid Process 19 cm  17.8    Just Proximal to Ulnar Styloid Process 14.8 cm  14.5    Across Hand at [employer] 17.1 cm  17.5    At [place] of 2nd Digit 5.9 cm  5.9             Left Upper  Extremity Lymphedema      10 cm Proximal to Olecranon Process 23.7 cm  22.3    Olecranon  Process 22.8 cm  22.2    10 cm Proximal to Ulnar Styloid Process 18.3 cm  17.7    Just Proximal to Ulnar Styloid Process 14.2 cm  13.9    Across Hand at [employer] 18.2 cm  17.8    At [place] of 2nd Digit 5.7 cm  5.7                   Surgery type/Date: 12/07/2021 Number of lymph nodes removed: 4 Current/past treatment (chemo, radiation, hormone therapy): neoadjuvant chemotherapy Other symptoms:  Heaviness/tightness Yes Pain Yes Pitting edema No Infections No Decreased scar mobility Yes Stemmer sign No     PATIENT EDUCATION:  Education details:supine scapular series except sword Person educated: Patient Education method: Consulting civil engineer, Demonstration, and Handouts Education comprehension: verbalized understanding and returned demonstration   TREATMENT TODAY 01/28/2022 Pulleys x 2 min flexion and abduction and scaption 1 min Supine wand flexion and scaption x 3 with 10 sec hold Wall slides for abduction x 5 ea  Standing scapular retraction, shoulder extension, bilateral ER with yellow band x 10 ea. Lengthy discussion about precautions for decreasing risk lymphedema and about skin care. Pt will order compression sleeve this weekend   01/25/2022 Pulleys x 2 min flexion and abduction Ball rolls on wall flexion x 10, Standing jobes flexion, scaption, abd x 10 with back against wall Theraband exs: standing scapular retraction, shoulder extension x 10, ER x 10 all with yellow band   01/22/22 Pulleys x 3 min flexion, abduction Ball rolls flexion x 10, bilateral abduction x 5 Held STW secondary to radiation following rx PROM bilateral shoulders in supine flex, scaption, abduction, ER THERAPEUTIC EXERCISE: supine scapular series; flexion, horizontal abduction, and bilateral ER x 10 ea with yellow band  01/21/2022 Pulleys x 2 min flexion, scaption, abduction Ball rolls flexion x 10, bilateral abduction x 5 Held STW secondary to radiation following rx PROM bilateral shoulders in  supine flex, scaption, abduction, ER THERAPEUTIC EXERCISE: supine scapular series; flexion, horizontal abduction, and bilateral ER x 5 ea with yellow band Updated HEP and discussed household chores. Pt completed quick dash  01/14/2022 ThERAPEUTIC EXERCISES; Pulleys x 2 min flex and abd, 1 min for scaption Ball rolls for flexion x 10, MANUAL soft tissue mobilization to bilateral pectorals, Lats and UT in supine.to decrease tissue tension. PROM bilateral shoulder flexion, scaption, abduction, ER    HOME EXERCISE PROGRAM: Reviewed previously given post op HEP. Educated in standing Scapular retraction, extension and ER with yellow band, jobes flex, scaption and Abduction.      ASSESSMENT:   CLINICAL IMPRESSION: Pt warmed up with pulleys and ROM exercises and AROM was assessed bilaterally for Recert.  Pt is making excellent progress but has not fully achieved goals for functional improvement at home as noted in quick dash and discussion with pt about return to activities. Pt will benefit from skilled PT 1x per week to progress through ABC strength class and to build pt confidence for DC.   Pt will benefit from skilled therapeutic intervention to improve on the following deficits: Decreased knowledge of precautions, impaired UE functional use, pain, decreased ROM, postural dysfunction.    PT treatment/interventions: ADL/Self care home management, Therapeutic exercises, Therapeutic activity, Neuromuscular re-education, Balance training, Gait training, Patient/Family education, Joint mobilization, Manual lymph drainage, scar mobilization, and Manual therapy  GOALS: Goals reviewed with patient? Yes   LONG TERM GOALS:  (STG=LTG)   GOALS Name Target Date Goal status  1 Pt will demonstrate she has regained full shoulder ROM and function post operatively compared to baselines.  Baseline: 02/25/2022 Ongoing Has not resumed carrying things, exercise  2 Patient will demonstrate she has >/=  155 degrees of flexion and abduction bilaterally for increased ease reaching overhead. 01/28/2022 MET  3 Patient will verbalize good understanding of lymphedema risk reduction practices. 01/28/2022 MET  4 Patient will improve her DASH score to be </= 20 for improved overall upper extremity function. 01/25/2022  25% 01/25/22 IN PROGRESS  5 Pt will be progressed through the ABC strength class 02/25/2022  New        PLAN: PT FREQUENCY/DURATION: 1 x/week for 4 weeks   PLAN FOR NEXT SESSION:  ABC strength,Compression sleeve,Cont. STM  if not having radiation esp to right side prn,stretching for bilateral UEs, review supine scapular series and add sword       Claris Pong, PT 01/28/2022, 8:59 AM

## 2022-01-29 ENCOUNTER — Other Ambulatory Visit: Payer: Self-pay

## 2022-01-29 ENCOUNTER — Ambulatory Visit
Admission: RE | Admit: 2022-01-29 | Discharge: 2022-01-29 | Disposition: A | Discharge status: Home / Self Care | Payer: No Typology Code available for payment source | Source: Ambulatory Visit | Attending: Radiation Oncology | Admitting: Radiation Oncology

## 2022-01-29 DIAGNOSIS — C50412 Malignant neoplasm of upper-outer quadrant of left female breast: Secondary | ICD-10-CM | POA: Diagnosis not present

## 2022-01-29 LAB — RAD ONC ARIA SESSION SUMMARY
Course Elapsed Days: 9
Plan Fractions Treated to Date: 8
Plan Prescribed Dose Per Fraction: 1.8 Gy
Plan Total Fractions Prescribed: 28
Plan Total Prescribed Dose: 50.4 Gy
Reference Point Dosage Given to Date: 14.4 Gy
Reference Point Session Dosage Given: 1.8 Gy
Session Number: 8

## 2022-02-01 ENCOUNTER — Other Ambulatory Visit: Payer: Self-pay

## 2022-02-01 ENCOUNTER — Ambulatory Visit
Admission: RE | Admit: 2022-02-01 | Discharge: 2022-02-01 | Disposition: A | Discharge status: Home / Self Care | Payer: No Typology Code available for payment source | Source: Ambulatory Visit | Attending: Radiation Oncology | Admitting: Radiation Oncology

## 2022-02-01 ENCOUNTER — Ambulatory Visit: Payer: No Typology Code available for payment source

## 2022-02-01 DIAGNOSIS — Z170 Estrogen receptor positive status [ER+]: Secondary | ICD-10-CM

## 2022-02-01 DIAGNOSIS — R293 Abnormal posture: Secondary | ICD-10-CM

## 2022-02-01 DIAGNOSIS — M25612 Stiffness of left shoulder, not elsewhere classified: Secondary | ICD-10-CM

## 2022-02-01 DIAGNOSIS — Z483 Aftercare following surgery for neoplasm: Secondary | ICD-10-CM

## 2022-02-01 DIAGNOSIS — C50412 Malignant neoplasm of upper-outer quadrant of left female breast: Secondary | ICD-10-CM | POA: Diagnosis not present

## 2022-02-01 LAB — RAD ONC ARIA SESSION SUMMARY
Course Elapsed Days: 12
Plan Fractions Treated to Date: 9
Plan Prescribed Dose Per Fraction: 1.8 Gy
Plan Total Fractions Prescribed: 28
Plan Total Prescribed Dose: 50.4 Gy
Reference Point Dosage Given to Date: 16.2 Gy
Reference Point Session Dosage Given: 1.8 Gy
Session Number: 9

## 2022-02-01 NOTE — Therapy (Signed)
OUTPATIENT PHYSICAL THERAPY TREATMENT NOTE   Patient Name: Ebony Lamb MRN: [id number] DOB:Aug 10, 1976, 46 y.o., female Today's Date: 02/01/2022  PCP: Antony Contras, MD REFERRING PROVIDER: Coralie Keens, MD  END OF SESSION:   PT End of Session - 02/01/22 1305     Visit Number 11    Number of Visits 14    Date for PT Re-Evaluation 02/25/22    PT Start Time 9678    PT Stop Time 1400    PT Time Calculation (min) 57 min    Activity Tolerance Patient tolerated treatment well    Behavior During Therapy [facility] for tasks assessed/performed                Past Medical History:  Diagnosis Date   Abnormal Pap smear 09/13/2000   Allergy 02/2020   Seasonal   Breast cancer ([place])    Depression    history of depression- denies currently   Hypertension    Preterm labor    Past Surgical History:  Procedure Laterality Date   BREAST BIOPSY Right 07/27/2019   BREAST RECONSTRUCTION WITH PLACEMENT OF TISSUE EXPANDER AND ALLODERM Bilateral 12/07/2021   Procedure: BREAST RECONSTRUCTION WITH PLACEMENT OF TISSUE EXPANDER AND ALLODERM;  Surgeon: Irene Limbo, MD;  Location: [place];  Service: Plastics;  Laterality: Bilateral;   CESAREAN SECTION  11/14/2011   Procedure: CESAREAN SECTION;  Surgeon: Eli Hose, MD;  Location: [facility];  Service: Gynecology;  Laterality: N/A;  Primary cesarean section with delivery of baby boy at [phone]. Apgars 9/9.   CESAREAN SECTION N/A 03/22/2014   Procedure: Repeat CESAREAN SECTION;  Surgeon: Alwyn Pea, MD;  Location: [facility];  Service: Obstetrics;  Laterality: N/A;   COSMETIC SURGERY  11/22/2017   Abdominoplasty   FOOT SURGERY     FRACTURE SURGERY  08/1999   Repair of 5th metatarsal in left foot   HERNIA REPAIR  11/22/2017   NIPPLE SPARING MASTECTOMY Left 12/07/2021   Procedure: LEFT NIPPLE SPARING MASTECTOMY;  Surgeon: Rolm Bookbinder, MD;  Location: [place];  Service: General;  Laterality: Left;    NIPPLE SPARING MASTECTOMY WITH SENTINEL LYMPH NODE BIOPSY Right 12/07/2021   Procedure: RIGHT NIPPLE SPARING MASTECTOMY WITH AXILLARY SENTINEL LYMPH NODE BIOPSY;  Surgeon: Rolm Bookbinder, MD;  Location: [place];  Service: General;  Laterality: Right;   PORTACATH PLACEMENT N/A 06/24/2021   Procedure: INSERTION PORT-A-CATH;  Surgeon: Coralie Keens, MD;  Location: [place];  Service: General;  Laterality: N/A;   TUBAL LIGATION  11/22/2017   Tubes were completely removed   WISDOM TOOTH EXTRACTION     Patient Active Problem List   Diagnosis Date Noted   Breast cancer, right ([place]) 12/07/2021   Port-A-Cath in place 08/10/2021   Genetic testing 06/17/2021   Family history of breast cancer 06/10/2021   Family history of ovarian cancer 06/10/2021   Family history of BRCA gene mutation 06/10/2021   Malignant neoplasm of upper-outer quadrant of right breast in female, estrogen receptor positive ([place]) 06/08/2021   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/25/2014   Cesarean delivery delivered 03/22/2014   Pregnancy induced hypertension--with 1st pregnancy 12/27/2011   History of irregular menstrual bleeding 11/12/2011   History of eating disorder 11/12/2011   History of cardiac arrhythmia - PAC's - cardiac work-up normal 11/12/2011    REFERRING DIAG: Right breast Cancer  THERAPY DIAG:  Aftercare following surgery for neoplasm  Malignant neoplasm of upper-outer quadrant of right breast  in female, estrogen receptor positive ([place])  Abnormal posture  Stiffness of left shoulder, not elsewhere classified  PERTINENT HISTORY: Patient was diagnosed on 06/01/2021 with right grade III invasive ductal carcinoma breast cancer. She underwent neoadjuvant chemotherapy and then had bilateral mastectomies with a right sentinel node biopsy (4 negative nodes) on 12/07/2021. 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     PRECAUTIONS: Recent  Surgery, right UE Lymphedema risk   SUBJECTIVE: Feeling good overall but had a really busy weekend.   PAIN:  Are you having pain? No, intermittent shooting pain in bilateral breast  OBJECTIVE: (objective measures completed at initial evaluation unless otherwise dated)   OBSERVATIONS: Breast incisions appear to be well healed. Her left nipple has some necrotic black tissue present. This is being addressed by Dr. Iran Planas and she sees Dr. Donne Hazel next week. No swelling present and no redness.    POSTURE:  Forward head and rounded shoulders posture                                                                                           06/10/22 12/31/21 01/14/22 01/28/2022   Right Shoulder Extension 57 Degrees 54             63 68  Right Shoulder Flexion 166 Degrees  125           150 162  Right Shoulder ABduction 168 Degrees  141            160 170  Right Shoulder Internal Rotation 75 Degrees  88    Right Shoulder External Rotation 90 Degrees  62             100 101  Left Shoulder Extension 60 Degrees  47              55 67  Left Shoulder Flexion 155 Degrees  142              157 162  Left Shoulder ABduction 175 Degrees  157              170 177  Left Shoulder Internal Rotation 73 Degrees  76    Left Shoulder External Rotation 85 Degrees  70                95 97         Right Upper Extremity Lymphedema 06/10/2022 12/31/2021    10 cm Proximal to Olecranon Process 24 cm  22.8    Olecranon Process 23.4 cm  22.4    10 cm Proximal to Ulnar Styloid Process 19 cm  17.8    Just Proximal to Ulnar Styloid Process 14.8 cm  14.5    Across Hand at [employer] 17.1 cm  17.5    At [place] of 2nd Digit 5.9 cm  5.9             Left Upper Extremity Lymphedema      10 cm Proximal to Olecranon Process 23.7 cm  22.3    Olecranon Process 22.8 cm  22.2    10 cm Proximal to Ulnar Styloid Process 18.3  cm  17.7    Just Proximal to Ulnar Styloid Process 14.2 cm  13.9    Across Hand at [employer]  18.2 cm  17.8    At [place] of 2nd Digit 5.7 cm  5.7                   Surgery type/Date: 12/07/2021 Number of lymph nodes removed: 4 Current/past treatment (chemo, radiation, hormone therapy): neoadjuvant chemotherapy Other symptoms:  Heaviness/tightness Yes Pain Yes Pitting edema No Infections No Decreased scar mobility Yes Stemmer sign No     PATIENT EDUCATION:  Education details:ABC strength handout Person educated: Patient Education method: Explanation, Demonstration, and Handouts Education comprehension: verbalized understanding and returned demonstration   TREATMENT TODAY 02/01/2022 Pulleys x 2 min flexion and abduction and scaption 1 min Ball rolls x 10 flexion, abd  x 5 Bilaterally ABC strength handout: warm up through end of booklet. Used 1# wts for shoulder elevation, and chest press and dead lifts, 2 # wts for biceps curls, heel raises, triceps kickbacks and 10 reps of each one exercise. Educated pt in proper breathing techniques. Pt given copy of handout and educated to perform 2x/week with 10 reps 1-2# wts   01/28/2022 Pulleys x 2 min flexion and abduction and scaption 1 min Supine wand flexion and scaption x 3 with 10 sec hold Wall slides for abduction x 5 ea  Standing scapular retraction, shoulder extension, bilateral ER with yellow band x 10 ea. Lengthy discussion about precautions for decreasing risk lymphedema and about skin care. Pt will order compression sleeve this weekend   01/25/2022 Pulleys x 2 min flexion and abduction Ball rolls on wall flexion x 10, Standing jobes flexion, scaption, abd x 10 with back against wall Theraband exs: standing scapular retraction, shoulder extension x 10, ER x 10 all with yellow band   01/22/22 Pulleys x 3 min flexion, abduction Ball rolls flexion x 10, bilateral abduction x 5 Held STW secondary to radiation following rx PROM bilateral shoulders in supine flex, scaption, abduction, ER THERAPEUTIC EXERCISE: supine  scapular series; flexion, horizontal abduction, and bilateral ER x 10 ea with yellow band     HOME EXERCISE PROGRAM: Reviewed previously given post op HEP. Educated in standing Scapular retraction, extension and ER with yellow band, jobes flex, scaption and Abduction.      ASSESSMENT:   CLINICAL IMPRESSION: Pt warmed up with pulleys and ball rolls and was progressed though ABC strength handout using 1 and 2# wts.  Pt required only occasional VC's for technique and did very well overall.  Srms noted to be slightly trembly with kick backs and quadruped stabilization. Pt forgot to order compression sleeve and will do so today.    Pt will benefit from skilled therapeutic intervention to improve on the following deficits: Decreased knowledge of precautions, impaired UE functional use, pain, decreased ROM, postural dysfunction.    PT treatment/interventions: ADL/Self care home management, Therapeutic exercises, Therapeutic activity, Neuromuscular re-education, Balance training, Gait training, Patient/Family education, Joint mobilization, Manual lymph drainage, scar mobilization, and Manual therapy         GOALS: Goals reviewed with patient? Yes   LONG TERM GOALS:  (STG=LTG)   GOALS Name Target Date Goal status  1 Pt will demonstrate she has regained full shoulder ROM and function post operatively compared to baselines.  Baseline: 02/25/2022 Ongoing Has not resumed carrying things, exercise  2 Patient will demonstrate she has >/= 155 degrees of flexion and abduction bilaterally for increased ease  reaching overhead. 01/28/2022 MET  3 Patient will verbalize good understanding of lymphedema risk reduction practices. 01/28/2022 MET  4 Patient will improve her DASH score to be </= 20 for improved overall upper extremity function. 01/25/2022  25% 01/25/22 IN PROGRESS  5 Pt will be progressed through the ABC strength class 02/25/2022  New        PLAN: PT FREQUENCY/DURATION: 1 x/week for 4  weeks   PLAN FOR NEXT SESSION:  ABC strength,Compression sleeve,Cont. STM  if not having radiation esp to right side prn,stretching for bilateral UEs, review supine scapular series and add sword       Claris Pong, PT 02/01/2022, 2:09 PM

## 2022-02-02 ENCOUNTER — Other Ambulatory Visit: Payer: Self-pay

## 2022-02-02 ENCOUNTER — Ambulatory Visit
Admission: RE | Admit: 2022-02-02 | Discharge: 2022-02-02 | Disposition: A | Discharge status: Home / Self Care | Payer: No Typology Code available for payment source | Source: Ambulatory Visit | Attending: Radiation Oncology | Admitting: Radiation Oncology

## 2022-02-02 DIAGNOSIS — C50412 Malignant neoplasm of upper-outer quadrant of left female breast: Secondary | ICD-10-CM | POA: Diagnosis not present

## 2022-02-02 LAB — RAD ONC ARIA SESSION SUMMARY
Course Elapsed Days: 13
Plan Fractions Treated to Date: 10
Plan Prescribed Dose Per Fraction: 1.8 Gy
Plan Total Fractions Prescribed: 28
Plan Total Prescribed Dose: 50.4 Gy
Reference Point Dosage Given to Date: 18 Gy
Reference Point Session Dosage Given: 1.8 Gy
Session Number: 10

## 2022-02-03 ENCOUNTER — Other Ambulatory Visit: Payer: Self-pay

## 2022-02-03 ENCOUNTER — Ambulatory Visit
Admission: RE | Admit: 2022-02-03 | Discharge: 2022-02-03 | Disposition: A | Discharge status: Home / Self Care | Payer: No Typology Code available for payment source | Source: Ambulatory Visit | Attending: Radiation Oncology | Admitting: Radiation Oncology

## 2022-02-03 DIAGNOSIS — C50412 Malignant neoplasm of upper-outer quadrant of left female breast: Secondary | ICD-10-CM | POA: Diagnosis not present

## 2022-02-03 LAB — RAD ONC ARIA SESSION SUMMARY
Course Elapsed Days: 14
Plan Fractions Treated to Date: 11
Plan Prescribed Dose Per Fraction: 1.8 Gy
Plan Total Fractions Prescribed: 28
Plan Total Prescribed Dose: 50.4 Gy
Reference Point Dosage Given to Date: 19.8 Gy
Reference Point Session Dosage Given: 1.8 Gy
Session Number: 11

## 2022-02-04 ENCOUNTER — Ambulatory Visit
Admission: RE | Admit: 2022-02-04 | Discharge: 2022-02-04 | Disposition: A | Discharge status: Home / Self Care | Payer: No Typology Code available for payment source | Source: Ambulatory Visit | Attending: Radiation Oncology | Admitting: Radiation Oncology

## 2022-02-04 ENCOUNTER — Other Ambulatory Visit: Payer: Self-pay

## 2022-02-04 DIAGNOSIS — C50412 Malignant neoplasm of upper-outer quadrant of left female breast: Secondary | ICD-10-CM | POA: Diagnosis not present

## 2022-02-04 LAB — RAD ONC ARIA SESSION SUMMARY
Course Elapsed Days: 15
Plan Fractions Treated to Date: 12
Plan Prescribed Dose Per Fraction: 1.8 Gy
Plan Total Fractions Prescribed: 28
Plan Total Prescribed Dose: 50.4 Gy
Reference Point Dosage Given to Date: 21.6 Gy
Reference Point Session Dosage Given: 1.8 Gy
Session Number: 12

## 2022-02-05 ENCOUNTER — Other Ambulatory Visit: Payer: Self-pay

## 2022-02-05 ENCOUNTER — Ambulatory Visit
Admission: RE | Admit: 2022-02-05 | Discharge: 2022-02-05 | Disposition: A | Discharge status: Home / Self Care | Payer: No Typology Code available for payment source | Source: Ambulatory Visit | Attending: Radiation Oncology | Admitting: Radiation Oncology

## 2022-02-05 DIAGNOSIS — C50412 Malignant neoplasm of upper-outer quadrant of left female breast: Secondary | ICD-10-CM | POA: Diagnosis not present

## 2022-02-05 LAB — RAD ONC ARIA SESSION SUMMARY
Course Elapsed Days: 16
Plan Fractions Treated to Date: 13
Plan Prescribed Dose Per Fraction: 1.8 Gy
Plan Total Fractions Prescribed: 28
Plan Total Prescribed Dose: 50.4 Gy
Reference Point Dosage Given to Date: 23.4 Gy
Reference Point Session Dosage Given: 1.8 Gy
Session Number: 13

## 2022-02-09 ENCOUNTER — Ambulatory Visit
Admission: RE | Admit: 2022-02-09 | Discharge: 2022-02-09 | Disposition: A | Discharge status: Home / Self Care | Payer: No Typology Code available for payment source | Source: Ambulatory Visit | Attending: Radiation Oncology | Admitting: Radiation Oncology

## 2022-02-09 ENCOUNTER — Ambulatory Visit: Payer: No Typology Code available for payment source | Attending: Radiation Oncology

## 2022-02-09 ENCOUNTER — Other Ambulatory Visit: Payer: Self-pay

## 2022-02-09 DIAGNOSIS — C50412 Malignant neoplasm of upper-outer quadrant of left female breast: Secondary | ICD-10-CM | POA: Diagnosis not present

## 2022-02-09 DIAGNOSIS — Z510 Encounter for antineoplastic radiation therapy: Secondary | ICD-10-CM | POA: Insufficient documentation

## 2022-02-09 DIAGNOSIS — Z170 Estrogen receptor positive status [ER+]: Secondary | ICD-10-CM | POA: Insufficient documentation

## 2022-02-09 LAB — RAD ONC ARIA SESSION SUMMARY
Course Elapsed Days: 20
Plan Fractions Treated to Date: 14
Plan Prescribed Dose Per Fraction: 1.8 Gy
Plan Total Fractions Prescribed: 28
Plan Total Prescribed Dose: 50.4 Gy
Reference Point Dosage Given to Date: 25.2 Gy
Reference Point Session Dosage Given: 1.8 Gy
Session Number: 14

## 2022-02-10 ENCOUNTER — Other Ambulatory Visit: Payer: Self-pay

## 2022-02-10 ENCOUNTER — Ambulatory Visit
Admission: RE | Admit: 2022-02-10 | Discharge: 2022-02-10 | Disposition: A | Discharge status: Home / Self Care | Payer: No Typology Code available for payment source | Source: Ambulatory Visit | Attending: Radiation Oncology | Admitting: Radiation Oncology

## 2022-02-10 DIAGNOSIS — C50412 Malignant neoplasm of upper-outer quadrant of left female breast: Secondary | ICD-10-CM | POA: Diagnosis not present

## 2022-02-10 LAB — RAD ONC ARIA SESSION SUMMARY
Course Elapsed Days: 21
Plan Fractions Treated to Date: 15
Plan Prescribed Dose Per Fraction: 1.8 Gy
Plan Total Fractions Prescribed: 28
Plan Total Prescribed Dose: 50.4 Gy
Reference Point Dosage Given to Date: 27 Gy
Reference Point Session Dosage Given: 1.8 Gy
Session Number: 15

## 2022-02-11 ENCOUNTER — Other Ambulatory Visit: Payer: Self-pay

## 2022-02-11 ENCOUNTER — Ambulatory Visit: Payer: No Typology Code available for payment source | Attending: Surgery

## 2022-02-11 ENCOUNTER — Ambulatory Visit
Admission: RE | Admit: 2022-02-11 | Discharge: 2022-02-11 | Disposition: A | Discharge status: Home / Self Care | Payer: No Typology Code available for payment source | Source: Ambulatory Visit | Attending: Radiation Oncology | Admitting: Radiation Oncology

## 2022-02-11 DIAGNOSIS — Z483 Aftercare following surgery for neoplasm: Secondary | ICD-10-CM | POA: Diagnosis present

## 2022-02-11 DIAGNOSIS — M25612 Stiffness of left shoulder, not elsewhere classified: Secondary | ICD-10-CM | POA: Diagnosis present

## 2022-02-11 DIAGNOSIS — C50412 Malignant neoplasm of upper-outer quadrant of left female breast: Secondary | ICD-10-CM | POA: Diagnosis present

## 2022-02-11 DIAGNOSIS — M25611 Stiffness of right shoulder, not elsewhere classified: Secondary | ICD-10-CM | POA: Insufficient documentation

## 2022-02-11 DIAGNOSIS — Z170 Estrogen receptor positive status [ER+]: Secondary | ICD-10-CM | POA: Diagnosis present

## 2022-02-11 DIAGNOSIS — Z510 Encounter for antineoplastic radiation therapy: Secondary | ICD-10-CM | POA: Diagnosis not present

## 2022-02-11 DIAGNOSIS — R293 Abnormal posture: Secondary | ICD-10-CM | POA: Diagnosis present

## 2022-02-11 DIAGNOSIS — C50411 Malignant neoplasm of upper-outer quadrant of right female breast: Secondary | ICD-10-CM | POA: Diagnosis present

## 2022-02-11 LAB — RAD ONC ARIA SESSION SUMMARY
Course Elapsed Days: 22
Plan Fractions Treated to Date: 16
Plan Prescribed Dose Per Fraction: 1.8 Gy
Plan Total Fractions Prescribed: 28
Plan Total Prescribed Dose: 50.4 Gy
Reference Point Dosage Given to Date: 28.8 Gy
Reference Point Session Dosage Given: 1.8 Gy
Session Number: 16

## 2022-02-11 NOTE — Therapy (Signed)
OUTPATIENT PHYSICAL THERAPY TREATMENT NOTE   Patient Name: Ebony Lamb MRN: [id number] DOB:June 06, 1976, 46 y.o., female Today's Date: 02/11/2022  PCP: Antony Contras, MD REFERRING PROVIDER: Coralie Keens, MD  END OF SESSION:   PT End of Session - 02/11/22 0806     Visit Number 12    Number of Visits 14    Date for PT Re-Evaluation 02/25/22    PT Start Time 0807    PT Stop Time 0901    PT Time Calculation (min) 54 min    Activity Tolerance Patient tolerated treatment well    Behavior During Therapy [facility] for tasks assessed/performed                Past Medical History:  Diagnosis Date   Abnormal Pap smear 09/13/2000   Allergy 02/2020   Seasonal   Breast cancer ([place])    Depression    history of depression- denies currently   Hypertension    Preterm labor    Past Surgical History:  Procedure Laterality Date   BREAST BIOPSY Right 07/27/2019   BREAST RECONSTRUCTION WITH PLACEMENT OF TISSUE EXPANDER AND ALLODERM Bilateral 12/07/2021   Procedure: BREAST RECONSTRUCTION WITH PLACEMENT OF TISSUE EXPANDER AND ALLODERM;  Surgeon: Irene Limbo, MD;  Location: [place];  Service: Plastics;  Laterality: Bilateral;   CESAREAN SECTION  11/14/2011   Procedure: CESAREAN SECTION;  Surgeon: Eli Hose, MD;  Location: [facility] [facility];  Service: Gynecology;  Laterality: N/A;  Primary cesarean section with delivery of baby boy at [phone]. Apgars 9/9.   CESAREAN SECTION N/A 03/22/2014   Procedure: Repeat CESAREAN SECTION;  Surgeon: Alwyn Pea, MD;  Location: [place] [facility];  Service: Obstetrics;  Laterality: N/A;   COSMETIC SURGERY  11/22/2017   Abdominoplasty   FOOT SURGERY     FRACTURE SURGERY  08/1999   Repair of 5th metatarsal in left foot   HERNIA REPAIR  11/22/2017   NIPPLE SPARING MASTECTOMY Left 12/07/2021   Procedure: LEFT NIPPLE SPARING MASTECTOMY;  Surgeon: Rolm Bookbinder, MD;  Location: [place];  Service: General;  Laterality: Left;   NIPPLE  SPARING MASTECTOMY WITH SENTINEL LYMPH NODE BIOPSY Right 12/07/2021   Procedure: RIGHT NIPPLE SPARING MASTECTOMY WITH AXILLARY SENTINEL LYMPH NODE BIOPSY;  Surgeon: Rolm Bookbinder, MD;  Location: [place];  Service: General;  Laterality: Right;   PORTACATH PLACEMENT N/A 06/24/2021   Procedure: INSERTION PORT-A-CATH;  Surgeon: Coralie Keens, MD;  Location: [place];  Service: General;  Laterality: N/A;   TUBAL LIGATION  11/22/2017   Tubes were completely removed   WISDOM TOOTH EXTRACTION     Patient Active Problem List   Diagnosis Date Noted   Breast cancer, right ([place]) 12/07/2021   Port-A-Cath in place 08/10/2021   Genetic testing 06/17/2021   Family history of breast cancer 06/10/2021   Family history of ovarian cancer 06/10/2021   Family history of BRCA gene mutation 06/10/2021   Malignant neoplasm of upper-outer quadrant of right breast in female, estrogen receptor positive ([place]) 06/08/2021   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/25/2014   Cesarean delivery delivered 03/22/2014   Pregnancy induced hypertension--with 1st pregnancy 12/27/2011   History of irregular menstrual bleeding 11/12/2011   History of eating disorder 11/12/2011   History of cardiac arrhythmia - PAC's - cardiac work-up normal 11/12/2011    REFERRING DIAG: Right breast Cancer  THERAPY DIAG:  Aftercare following surgery for neoplasm  Malignant neoplasm of upper-outer quadrant of right breast  in female, estrogen receptor positive ([place])  Abnormal posture  Stiffness of left shoulder, not elsewhere classified  Stiffness of right shoulder, not elsewhere classified  PERTINENT HISTORY: Patient was diagnosed on 06/01/2021 with right grade III invasive ductal carcinoma breast cancer. She underwent neoadjuvant chemotherapy and then had bilateral mastectomies with a right sentinel node biopsy (4 negative nodes) on 12/07/2021. It is functionally triple negative  with a Ki67 of 70%.     PRECAUTIONS: Recent Surgery, right UE Lymphedema risk   SUBJECTIVE: Things are going well. Occasional tightness in the chest. Radiation is going well and I completed number 16 today. She ordered her sleeve and it arrived, but I forgot to bring it. I did the ABC handout 1 time through 1 time and I had some back pain but I don't think it was the exercises.   PAIN:  Are you having pain? No, intermittent shooting pain in bilateral breast  OBJECTIVE: (objective measures completed at initial evaluation unless otherwise dated)   OBSERVATIONS: Breast incisions appear to be well healed. Her left nipple has some necrotic black tissue present. This is being addressed by Dr. Iran Planas and she sees Dr. Donne Hazel next week. No swelling present and no redness.    POSTURE:  Forward head and rounded shoulders posture                                                                                           06/10/22 12/31/21 01/14/22 01/28/2022   Right Shoulder Extension 57 Degrees 54             63 68  Right Shoulder Flexion 166 Degrees  125           150 162  Right Shoulder ABduction 168 Degrees  141            160 170  Right Shoulder Internal Rotation 75 Degrees  88    Right Shoulder External Rotation 90 Degrees  62             100 101  Left Shoulder Extension 60 Degrees  47              55 67  Left Shoulder Flexion 155 Degrees  142              157 162  Left Shoulder ABduction 175 Degrees  157              170 177  Left Shoulder Internal Rotation 73 Degrees  76    Left Shoulder External Rotation 85 Degrees  70                95 97         Right Upper Extremity Lymphedema 06/10/2022 12/31/2021    10 cm Proximal to Olecranon Process 24 cm  22.8    Olecranon Process 23.4 cm  22.4    10 cm Proximal to Ulnar Styloid Process 19 cm  17.8    Just Proximal to Ulnar Styloid Process 14.8 cm  14.5    Across Hand at [employer] 17.1 cm  17.5    At [place] of 2nd Digit  5.9 cm  5.9              Left Upper Extremity Lymphedema      10 cm Proximal to Olecranon Process 23.7 cm  22.3    Olecranon Process 22.8 cm  22.2    10 cm Proximal to Ulnar Styloid Process 18.3 cm  17.7    Just Proximal to Ulnar Styloid Process 14.2 cm  13.9    Across Hand at [employer] 18.2 cm  17.8    At [place] of 2nd Digit 5.7 cm  5.7                   Surgery type/Date: 12/07/2021 Number of lymph nodes removed: 4 Current/past treatment (chemo, radiation, hormone therapy): neoadjuvant chemotherapy Other symptoms:  Heaviness/tightness Yes Pain Yes Pitting edema No Infections No Decreased scar mobility Yes Stemmer sign No     PATIENT EDUCATION:  Education details:ABC strength handout Person educated: Patient Education method: Explanation, Demonstration, and Handouts Education comprehension: verbalized understanding and returned demonstration   TREATMENT TODAY 02/11/2022 Nu Step seat 6 UE 8, lev 5 for warm up x 4 min Pt went through [facility] handout warm up  and flexibility through front raises all x 10 with 2# wts for exercises with wts.and ending with 30 sec stretches Discussed which exercises to do;ie stretches daily, Band 1x fper week, ABC 2 times per week.  02/01/2022 Pulleys x 2 min flexion and abduction and scaption 1 min Ball rolls x 10 flexion, abd  x 5 Bilaterally ABC strength handout: warm up through end of booklet. Used 1# wts for shoulder elevation, and chest press and dead lifts, 2 # wts for biceps curls, heel raises, triceps kickbacks and 10 reps of each one exercise. Educated pt in proper breathing techniques. Pt given copy of handout and educated to perform 2x/week with 10 reps 1-2# wts   01/28/2022 Pulleys x 2 min flexion and abduction and scaption 1 min Supine wand flexion and scaption x 3 with 10 sec hold Wall slides for abduction x 5 ea  Standing scapular retraction, shoulder extension, bilateral ER with yellow band x 10 ea. Lengthy discussion about precautions for  decreasing risk lymphedema and about skin care. Pt will order compression sleeve this weekend   01/25/2022 Pulleys x 2 min flexion and abduction Ball rolls on wall flexion x 10, Standing jobes flexion, scaption, abd x 10 with back against wall Theraband exs: standing scapular retraction, shoulder extension x 10, ER x 10 all with yellow band   01/22/22 Pulleys x 3 min flexion, abduction Ball rolls flexion x 10, bilateral abduction x 5 Held STW secondary to radiation following rx PROM bilateral shoulders in supine flex, scaption, abduction, ER THERAPEUTIC EXERCISE: supine scapular series; flexion, horizontal abduction, and bilateral ER x 10 ea with yellow band     HOME EXERCISE PROGRAM: Reviewed previously given post op HEP. Educated in standing Scapular retraction, extension and ER with yellow band, jobes flex, scaption and Abduction.      ASSESSMENT:   CLINICAL IMPRESSION: Pt did exceptionally well with all exercises and used excellent form with all. No complaint of LBP. She purchased and received her sleeve and will bring it next time.   Pt will benefit from skilled therapeutic intervention to improve on the following deficits: Decreased knowledge of precautions, impaired UE functional use, pain, decreased ROM, postural dysfunction.    of pain with exercises PT treatment/interventions: ADL/Self care home management, Therapeutic exercises, Therapeutic activity, Neuromuscular re-education, Balance  training, Gait training, Patient/Family education, Joint mobilization, Manual lymph drainage, scar mobilization, and Manual therapy         GOALS: Goals reviewed with patient? Yes   LONG TERM GOALS:  (STG=LTG)   GOALS Name Target Date Goal status  1 Pt will demonstrate she has regained full shoulder ROM and function post operatively compared to baselines.  Baseline: 02/25/2022 Ongoing Has not resumed carrying things, exercise  2 Patient will demonstrate she has >/= 155 degrees of  flexion and abduction bilaterally for increased ease reaching overhead. 01/28/2022 MET  3 Patient will verbalize good understanding of lymphedema risk reduction practices. 01/28/2022 MET  4 Patient will improve her DASH score to be </= 20 for improved overall upper extremity function. 01/25/2022  25% 01/25/22 IN PROGRESS  5 Pt will be progressed through the ABC strength class 02/25/2022  New        PLAN: PT FREQUENCY/DURATION: 1 x/week for 4 weeks   PLAN FOR NEXT SESSION:  ABC strength,Compression sleeve,Cont. STM  if not having radiation esp to right side prn,stretching for bilateral UEs, review supine scapular series and add sword       Claris Pong, PT 02/11/2022, 9:02 AM

## 2022-02-12 ENCOUNTER — Other Ambulatory Visit: Payer: Self-pay

## 2022-02-12 ENCOUNTER — Ambulatory Visit
Admission: RE | Admit: 2022-02-12 | Discharge: 2022-02-12 | Disposition: A | Discharge status: Home / Self Care | Payer: No Typology Code available for payment source | Source: Ambulatory Visit | Attending: Radiation Oncology | Admitting: Radiation Oncology

## 2022-02-12 DIAGNOSIS — C50412 Malignant neoplasm of upper-outer quadrant of left female breast: Secondary | ICD-10-CM | POA: Diagnosis not present

## 2022-02-12 LAB — RAD ONC ARIA SESSION SUMMARY
Course Elapsed Days: 23
Plan Fractions Treated to Date: 17
Plan Prescribed Dose Per Fraction: 1.8 Gy
Plan Total Fractions Prescribed: 28
Plan Total Prescribed Dose: 50.4 Gy
Reference Point Dosage Given to Date: 30.6 Gy
Reference Point Session Dosage Given: 1.8 Gy
Session Number: 17

## 2022-02-15 ENCOUNTER — Ambulatory Visit: Payer: No Typology Code available for payment source

## 2022-02-15 ENCOUNTER — Ambulatory Visit
Admission: RE | Admit: 2022-02-15 | Discharge: 2022-02-15 | Disposition: A | Discharge status: Home / Self Care | Payer: No Typology Code available for payment source | Source: Ambulatory Visit | Attending: Radiation Oncology | Admitting: Radiation Oncology

## 2022-02-15 ENCOUNTER — Other Ambulatory Visit: Payer: Self-pay

## 2022-02-15 DIAGNOSIS — C50412 Malignant neoplasm of upper-outer quadrant of left female breast: Secondary | ICD-10-CM | POA: Diagnosis not present

## 2022-02-15 LAB — RAD ONC ARIA SESSION SUMMARY
Course Elapsed Days: 26
Plan Fractions Treated to Date: 18
Plan Prescribed Dose Per Fraction: 1.8 Gy
Plan Total Fractions Prescribed: 28
Plan Total Prescribed Dose: 50.4 Gy
Reference Point Dosage Given to Date: 32.4 Gy
Reference Point Session Dosage Given: 1.8 Gy
Session Number: 18

## 2022-02-16 ENCOUNTER — Ambulatory Visit: Payer: No Typology Code available for payment source

## 2022-02-16 ENCOUNTER — Other Ambulatory Visit: Payer: Self-pay

## 2022-02-16 ENCOUNTER — Encounter: Payer: Self-pay | Admitting: Hematology and Oncology

## 2022-02-16 ENCOUNTER — Ambulatory Visit
Admission: RE | Admit: 2022-02-16 | Discharge: 2022-02-16 | Disposition: A | Discharge status: Home / Self Care | Payer: No Typology Code available for payment source | Source: Ambulatory Visit | Attending: Radiation Oncology | Admitting: Radiation Oncology

## 2022-02-16 DIAGNOSIS — C50412 Malignant neoplasm of upper-outer quadrant of left female breast: Secondary | ICD-10-CM | POA: Diagnosis not present

## 2022-02-16 LAB — RAD ONC ARIA SESSION SUMMARY
Course Elapsed Days: 27
Plan Fractions Treated to Date: 19
Plan Prescribed Dose Per Fraction: 1.8 Gy
Plan Total Fractions Prescribed: 28
Plan Total Prescribed Dose: 50.4 Gy
Reference Point Dosage Given to Date: 34.2 Gy
Reference Point Session Dosage Given: 1.8 Gy
Session Number: 19

## 2022-02-16 MED ORDER — TAMOXIFEN CITRATE 20 MG PO TABS: 20.0000 mg | ORAL_TABLET | Freq: Every day | ORAL | 3 refills | Status: DC

## 2022-02-17 ENCOUNTER — Other Ambulatory Visit: Payer: Self-pay

## 2022-02-17 ENCOUNTER — Ambulatory Visit
Admission: RE | Admit: 2022-02-17 | Discharge: 2022-02-17 | Disposition: A | Discharge status: Home / Self Care | Payer: No Typology Code available for payment source | Source: Ambulatory Visit | Attending: Radiation Oncology | Admitting: Radiation Oncology

## 2022-02-17 DIAGNOSIS — C50412 Malignant neoplasm of upper-outer quadrant of left female breast: Secondary | ICD-10-CM | POA: Diagnosis not present

## 2022-02-17 LAB — RAD ONC ARIA SESSION SUMMARY
Course Elapsed Days: 28
Plan Fractions Treated to Date: 20
Plan Prescribed Dose Per Fraction: 1.8 Gy
Plan Total Fractions Prescribed: 28
Plan Total Prescribed Dose: 50.4 Gy
Reference Point Dosage Given to Date: 36 Gy
Reference Point Session Dosage Given: 1.8 Gy
Session Number: 20

## 2022-02-18 ENCOUNTER — Ambulatory Visit
Admission: RE | Admit: 2022-02-18 | Discharge: 2022-02-18 | Disposition: A | Discharge status: Home / Self Care | Payer: No Typology Code available for payment source | Source: Ambulatory Visit | Attending: Radiation Oncology | Admitting: Radiation Oncology

## 2022-02-18 ENCOUNTER — Other Ambulatory Visit: Payer: Self-pay

## 2022-02-18 ENCOUNTER — Ambulatory Visit: Payer: No Typology Code available for payment source

## 2022-02-18 DIAGNOSIS — Z483 Aftercare following surgery for neoplasm: Secondary | ICD-10-CM

## 2022-02-18 DIAGNOSIS — M25612 Stiffness of left shoulder, not elsewhere classified: Secondary | ICD-10-CM

## 2022-02-18 DIAGNOSIS — R293 Abnormal posture: Secondary | ICD-10-CM

## 2022-02-18 DIAGNOSIS — C50412 Malignant neoplasm of upper-outer quadrant of left female breast: Secondary | ICD-10-CM | POA: Diagnosis not present

## 2022-02-18 DIAGNOSIS — M25611 Stiffness of right shoulder, not elsewhere classified: Secondary | ICD-10-CM

## 2022-02-18 DIAGNOSIS — C50411 Malignant neoplasm of upper-outer quadrant of right female breast: Secondary | ICD-10-CM

## 2022-02-18 LAB — RAD ONC ARIA SESSION SUMMARY
Course Elapsed Days: 29
Plan Fractions Treated to Date: 21
Plan Prescribed Dose Per Fraction: 1.8 Gy
Plan Total Fractions Prescribed: 28
Plan Total Prescribed Dose: 50.4 Gy
Reference Point Dosage Given to Date: 37.8 Gy
Reference Point Session Dosage Given: 1.8 Gy
Session Number: 21

## 2022-02-18 NOTE — Therapy (Addendum)
OUTPATIENT PHYSICAL THERAPY TREATMENT NOTE   Patient Name: Ebony Lamb MRN: [id number] DOB:03-Mar-1976, 46 y.o., female Today's Date: 02/18/2022  PCP: Antony Contras, MD REFERRING PROVIDER: Coralie Keens MD END OF SESSION:   PT End of Session - 02/18/22 1100     Visit Number 13    Number of Visits 14    Date for PT Re-Evaluation 02/25/22    PT Start Time 1005    PT Stop Time 1059    PT Time Calculation (min) 54 min    Activity Tolerance Patient tolerated treatment well    Behavior During Therapy [facility] for tasks assessed/performed                Past Medical History:  Diagnosis Date   Abnormal Pap smear 09/13/2000   Allergy 02/2020   Seasonal   Breast cancer ([place])    Depression    history of depression- denies currently   Hypertension    Preterm labor    Past Surgical History:  Procedure Laterality Date   BREAST BIOPSY Right 07/27/2019   BREAST RECONSTRUCTION WITH PLACEMENT OF TISSUE EXPANDER AND ALLODERM Bilateral 12/07/2021   Procedure: BREAST RECONSTRUCTION WITH PLACEMENT OF TISSUE EXPANDER AND ALLODERM;  Surgeon: Irene Limbo, MD;  Location: [place];  Service: Plastics;  Laterality: Bilateral;   CESAREAN SECTION  11/14/2011   Procedure: CESAREAN SECTION;  Surgeon: Eli Hose, MD;  Location: [facility];  Service: Gynecology;  Laterality: N/A;  Primary cesarean section with delivery of baby boy at [phone]. Apgars 9/9.   CESAREAN SECTION N/A 03/22/2014   Procedure: Repeat CESAREAN SECTION;  Surgeon: Alwyn Pea, MD;  Location: [facility];  Service: Obstetrics;  Laterality: N/A;   COSMETIC SURGERY  11/22/2017   Abdominoplasty   FOOT SURGERY     FRACTURE SURGERY  08/1999   Repair of 5th metatarsal in left foot   HERNIA REPAIR  11/22/2017   NIPPLE SPARING MASTECTOMY Left 12/07/2021   Procedure: LEFT NIPPLE SPARING MASTECTOMY;  Surgeon: Rolm Bookbinder, MD;  Location: [place];  Service: General;  Laterality: Left;   NIPPLE  SPARING MASTECTOMY WITH SENTINEL LYMPH NODE BIOPSY Right 12/07/2021   Procedure: RIGHT NIPPLE SPARING MASTECTOMY WITH AXILLARY SENTINEL LYMPH NODE BIOPSY;  Surgeon: Rolm Bookbinder, MD;  Location: [place];  Service: General;  Laterality: Right;   PORTACATH PLACEMENT N/A 06/24/2021   Procedure: INSERTION PORT-A-CATH;  Surgeon: Coralie Keens, MD;  Location: [place];  Service: General;  Laterality: N/A;   TUBAL LIGATION  11/22/2017   Tubes were completely removed   WISDOM TOOTH EXTRACTION     Patient Active Problem List   Diagnosis Date Noted   Breast cancer, right ([place]) 12/07/2021   Port-A-Cath in place 08/10/2021   Genetic testing 06/17/2021   Family history of breast cancer 06/10/2021   Family history of ovarian cancer 06/10/2021   Family history of BRCA gene mutation 06/10/2021   Malignant neoplasm of upper-outer quadrant of right breast in female, estrogen receptor positive ([place]) 06/08/2021   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/25/2014   Cesarean delivery delivered 03/22/2014   Pregnancy induced hypertension--with 1st pregnancy 12/27/2011   History of irregular menstrual bleeding 11/12/2011   History of eating disorder 11/12/2011   History of cardiac arrhythmia - PAC's - cardiac work-up normal 11/12/2011    REFERRING DIAG: Right breast Cancer  THERAPY DIAG:  Aftercare following surgery for neoplasm  Malignant neoplasm of upper-outer quadrant of right breast in  female, estrogen receptor positive ([place])  Abnormal posture  Stiffness of left shoulder, not elsewhere classified  Stiffness of right shoulder, not elsewhere classified  PERTINENT HISTORY: Patient was diagnosed on 06/01/2021 with right grade III invasive ductal carcinoma breast cancer. She underwent neoadjuvant chemotherapy and then had bilateral mastectomies with a right sentinel node biopsy (4 negative nodes) on 12/07/2021. It is functionally triple negative  with a Ki67 of 70%.     PRECAUTIONS: Recent Surgery, right UE Lymphedema risk   SUBJECTIVE: Radiation is going well but they haven't been on time as much this week. I have 8 left. Skin is starting to get more red.  PAIN:  Are you having pain? No, intermittent shooting pain in bilateral breast  OBJECTIVE: (objective measures completed at initial evaluation unless otherwise dated)   OBSERVATIONS: Breast incisions appear to be well healed. Her left nipple has some necrotic black tissue present. This is being addressed by Dr. Iran Planas and she sees Dr. Donne Hazel next week. No swelling present and no redness.    POSTURE:  Forward head and rounded shoulders posture                                                                                           06/10/22 12/31/21 01/14/22 01/28/2022   Right Shoulder Extension 57 Degrees 54             63 68  Right Shoulder Flexion 166 Degrees  125           150 162  Right Shoulder ABduction 168 Degrees  141            160 170  Right Shoulder Internal Rotation 75 Degrees  88    Right Shoulder External Rotation 90 Degrees  62             100 101  Left Shoulder Extension 60 Degrees  47              55 67  Left Shoulder Flexion 155 Degrees  142              157 162  Left Shoulder ABduction 175 Degrees  157              170 177  Left Shoulder Internal Rotation 73 Degrees  76    Left Shoulder External Rotation 85 Degrees  70                95 97         Right Upper Extremity Lymphedema 06/10/2022 12/31/2021    10 cm Proximal to Olecranon Process 24 cm  22.8    Olecranon Process 23.4 cm  22.4    10 cm Proximal to Ulnar Styloid Process 19 cm  17.8    Just Proximal to Ulnar Styloid Process 14.8 cm  14.5    Across Hand at [employer] 17.1 cm  17.5    At [place] of 2nd Digit 5.9 cm  5.9             Left Upper Extremity Lymphedema      10 cm Proximal to Olecranon Process 23.7 cm  22.3    Olecranon Process 22.8 cm  22.2    10 cm Proximal to Ulnar Styloid  Process 18.3 cm  17.7    Just Proximal to Ulnar Styloid Process 14.2 cm  13.9    Across Hand at [employer] 18.2 cm  17.8    At [place] of 2nd Digit 5.7 cm  5.7                   Surgery type/Date: 12/07/2021 Number of lymph nodes removed: 4 Current/past treatment (chemo, radiation, hormone therapy): neoadjuvant chemotherapy Other symptoms:  Heaviness/tightness Yes Pain Yes Pitting edema No Infections No Decreased scar mobility Yes Stemmer sign No     PATIENT EDUCATION:  Education details:ABC strength handout Person educated: Patient Education method: Explanation, Demonstration, and Handouts Education comprehension: verbalized understanding and returned demonstration   TREATMENT TODAY 02/18/2022 Pt practiced donning/doffing her harmony sleeve and gauntlet independently. It fits well and was comfortable. Discussed precautions and when to wear sleeve. NuStep seat 8, UE 8 x 4 min,  Flexibility per ABC handout ,elevation to the end using 2 # wts. Discussed proper way to progress resistance and repetitions. Answered pt questions   02/11/2022 Nu Step seat 6 UE 8, lev 5 for warm up x 4 min Pt went through [facility] handout warm up  and flexibility through front raises all x 10 with 2# wts for exercises with wts.and ending with 30 sec stretches Discussed which exercises to do;ie stretches daily, Band 1x per week, ABC 2 times per week.  02/01/2022 Pulleys x 2 min flexion and abduction and scaption 1 min Ball rolls x 10 flexion, abd  x 5 Bilaterally ABC strength handout: warm up through end of booklet. Used 1# wts for shoulder elevation, and chest press and dead lifts, 2 # wts for biceps curls, heel raises, triceps kickbacks and 10 reps of each one exercise. Educated pt in proper breathing techniques. Pt given copy of handout and educated to perform 2x/week with 10 reps 1-2# wts   01/28/2022 Pulleys x 2 min flexion and abduction and scaption 1 min Supine wand flexion and scaption x 3 with 10  sec hold Wall slides for abduction x 5 ea  Standing scapular retraction, shoulder extension, bilateral ER with yellow band x 10 ea. Lengthy discussion about precautions for decreasing risk lymphedema and about skin care. Pt will order compression sleeve this weekend       HOME EXERCISE PROGRAM: Reviewed previously given post op HEP. Educated in standing Scapular retraction, extension and ER with yellow band, jobes flex, scaption and Abduction.      ASSESSMENT:   CLINICAL IMPRESSION: Pts sleeve fit well and pt was able to don independently. She wore throughout exercises and was comfortable. She feels she is ready for discharge. She has achieved all goals except return to full function. She has not yet resumed her [place] class   Pt will benefit from skilled therapeutic intervention to improve on the following deficits: Decreased knowledge of precautions, impaired UE functional use, pain, decreased ROM, postural dysfunction.    of pain with exercises PT treatment/interventions: ADL/Self care home management, Therapeutic exercises, Therapeutic activity, Neuromuscular re-education, Balance training, Gait training, Patient/Family education, Joint mobilization, Manual lymph drainage, scar mobilization, and Manual therapy         GOALS: Goals reviewed with patient? Yes   LONG TERM GOALS:  (STG=LTG)   GOALS Name Target Date Goal status  1 Pt will demonstrate she has regained full shoulder ROM and  function post operatively compared to baselines.  Baseline: 02/18/2022 Partially MET Has not resumed exercise Barre class and some heavier things at home.  2 Patient will demonstrate she has >/= 155 degrees of flexion and abduction bilaterally for increased ease reaching overhead. 01/28/2022 MET 02/18/2022  3 Patient will verbalize good understanding of lymphedema risk reduction practices. 01/28/2022 02/18/2022 MET  4 Patient will improve her DASH score to be </= 20 for improved overall upper extremity  function. 01/25/2022  25% 01/25/22 MET 02/18/2022  5 Pt will be progressed through the ABC strength class 02/18/2022  MET 02/18/2022        PLAN: PT FREQUENCY/DURATION: 1 x/week for 4 weeks   PLAN FOR NEXT SESSION:  Pt is discharged to continue with Her HEP. She will contact me with questions or concerns    PHYSICAL THERAPY DISCHARGE SUMMARY  Visits from Start of Care: 13  Current functional level related to goals / functional outcomes: Achieved all goals except partially achieved goal # 1 since she has not yet returned to [facility] Class   Remaining deficits: None;pt is going through radiation   Education / Equipment: Compression sleeve/gauntlet/ Theraband   Patient agrees to discharge. Patient goals were met. Patient is being discharged due to meeting the stated rehab goals.   Claris Pong, PT 02/18/2022, 12:11 PM

## 2022-02-19 ENCOUNTER — Ambulatory Visit
Admission: RE | Admit: 2022-02-19 | Discharge: 2022-02-19 | Disposition: A | Discharge status: Home / Self Care | Payer: No Typology Code available for payment source | Source: Ambulatory Visit | Attending: Radiation Oncology | Admitting: Radiation Oncology

## 2022-02-19 ENCOUNTER — Other Ambulatory Visit: Payer: Self-pay

## 2022-02-19 DIAGNOSIS — C50412 Malignant neoplasm of upper-outer quadrant of left female breast: Secondary | ICD-10-CM | POA: Diagnosis not present

## 2022-02-19 LAB — RAD ONC ARIA SESSION SUMMARY
Course Elapsed Days: 30
Plan Fractions Treated to Date: 22
Plan Prescribed Dose Per Fraction: 1.8 Gy
Plan Total Fractions Prescribed: 28
Plan Total Prescribed Dose: 50.4 Gy
Reference Point Dosage Given to Date: 39.6 Gy
Reference Point Session Dosage Given: 1.8 Gy
Session Number: 22

## 2022-02-22 ENCOUNTER — Other Ambulatory Visit: Payer: Self-pay

## 2022-02-22 ENCOUNTER — Ambulatory Visit
Admission: RE | Admit: 2022-02-22 | Discharge: 2022-02-22 | Disposition: A | Discharge status: Home / Self Care | Payer: No Typology Code available for payment source | Source: Ambulatory Visit | Attending: Radiation Oncology | Admitting: Radiation Oncology

## 2022-02-22 DIAGNOSIS — C50412 Malignant neoplasm of upper-outer quadrant of left female breast: Secondary | ICD-10-CM | POA: Diagnosis not present

## 2022-02-22 LAB — RAD ONC ARIA SESSION SUMMARY
Course Elapsed Days: 33
Plan Fractions Treated to Date: 23
Plan Prescribed Dose Per Fraction: 1.8 Gy
Plan Total Fractions Prescribed: 28
Plan Total Prescribed Dose: 50.4 Gy
Reference Point Dosage Given to Date: 41.4 Gy
Reference Point Session Dosage Given: 1.8 Gy
Session Number: 23

## 2022-02-23 ENCOUNTER — Other Ambulatory Visit: Payer: Self-pay

## 2022-02-23 ENCOUNTER — Ambulatory Visit
Admission: RE | Admit: 2022-02-23 | Discharge: 2022-02-23 | Disposition: A | Discharge status: Home / Self Care | Payer: No Typology Code available for payment source | Source: Ambulatory Visit | Attending: Radiation Oncology | Admitting: Radiation Oncology

## 2022-02-23 DIAGNOSIS — C50412 Malignant neoplasm of upper-outer quadrant of left female breast: Secondary | ICD-10-CM | POA: Diagnosis not present

## 2022-02-23 LAB — RAD ONC ARIA SESSION SUMMARY
Course Elapsed Days: 34
Plan Fractions Treated to Date: 24
Plan Prescribed Dose Per Fraction: 1.8 Gy
Plan Total Fractions Prescribed: 28
Plan Total Prescribed Dose: 50.4 Gy
Reference Point Dosage Given to Date: 43.2 Gy
Reference Point Session Dosage Given: 1.8 Gy
Session Number: 24

## 2022-02-24 ENCOUNTER — Other Ambulatory Visit: Payer: Self-pay

## 2022-02-24 ENCOUNTER — Ambulatory Visit
Admission: RE | Admit: 2022-02-24 | Discharge: 2022-02-24 | Disposition: A | Discharge status: Home / Self Care | Payer: No Typology Code available for payment source | Source: Ambulatory Visit | Attending: Radiation Oncology | Admitting: Radiation Oncology

## 2022-02-24 ENCOUNTER — Encounter: Payer: Self-pay | Admitting: Physical Therapy

## 2022-02-24 DIAGNOSIS — C50412 Malignant neoplasm of upper-outer quadrant of left female breast: Secondary | ICD-10-CM | POA: Diagnosis not present

## 2022-02-24 LAB — RAD ONC ARIA SESSION SUMMARY
Course Elapsed Days: 35
Plan Fractions Treated to Date: 25
Plan Prescribed Dose Per Fraction: 1.8 Gy
Plan Total Fractions Prescribed: 28
Plan Total Prescribed Dose: 50.4 Gy
Reference Point Dosage Given to Date: 45 Gy
Reference Point Session Dosage Given: 1.8 Gy
Session Number: 25

## 2022-02-25 ENCOUNTER — Other Ambulatory Visit: Payer: Self-pay

## 2022-02-25 ENCOUNTER — Ambulatory Visit: Payer: No Typology Code available for payment source

## 2022-02-25 ENCOUNTER — Ambulatory Visit
Admission: RE | Admit: 2022-02-25 | Discharge: 2022-02-25 | Disposition: A | Discharge status: Home / Self Care | Payer: No Typology Code available for payment source | Source: Ambulatory Visit | Attending: Radiation Oncology | Admitting: Radiation Oncology

## 2022-02-25 DIAGNOSIS — C50412 Malignant neoplasm of upper-outer quadrant of left female breast: Secondary | ICD-10-CM | POA: Diagnosis not present

## 2022-02-25 DIAGNOSIS — Z483 Aftercare following surgery for neoplasm: Secondary | ICD-10-CM

## 2022-02-25 LAB — RAD ONC ARIA SESSION SUMMARY
Course Elapsed Days: 36
Plan Fractions Treated to Date: 26
Plan Prescribed Dose Per Fraction: 1.8 Gy
Plan Total Fractions Prescribed: 28
Plan Total Prescribed Dose: 50.4 Gy
Reference Point Dosage Given to Date: 46.8 Gy
Reference Point Session Dosage Given: 1.8 Gy
Session Number: 26

## 2022-02-25 NOTE — Therapy (Signed)
OUTPATIENT PHYSICAL THERAPY SOZO SCREENING NOTE   Patient Name: Ebony Lamb MRN: [id number] DOB:[birth year], 46 y.o., female Today's Date: 02/25/2022  PCP: Antony Contras, MD REFERRING PROVIDER: Coralie Keens, MD   PT End of Session - 02/25/22 1221     Visit Number 13   # unchanged due to screen only   PT Start Time 1208    PT Stop Time 1212    PT Time Calculation (min) 4 min    Activity Tolerance Patient tolerated treatment well    Behavior During Therapy [facility] for tasks assessed/performed             Past Medical History:  Diagnosis Date   Abnormal Pap smear 09/13/2000   Allergy 02/2020   Seasonal   Breast cancer ([place])    Depression    history of depression- denies currently   Hypertension    Preterm labor    Past Surgical History:  Procedure Laterality Date   BREAST BIOPSY Right 07/27/2019   BREAST RECONSTRUCTION WITH PLACEMENT OF TISSUE EXPANDER AND ALLODERM Bilateral 12/07/2021   Procedure: BREAST RECONSTRUCTION WITH PLACEMENT OF TISSUE EXPANDER AND ALLODERM;  Surgeon: Irene Limbo, MD;  Location: [place];  Service: Plastics;  Laterality: Bilateral;   CESAREAN SECTION  11/14/2011   Procedure: CESAREAN SECTION;  Surgeon: Eli Hose, MD;  Location: [facility];  Service: Gynecology;  Laterality: N/A;  Primary cesarean section with delivery of baby boy at [phone]. Apgars 9/9.   CESAREAN SECTION N/A 03/22/2014   Procedure: Repeat CESAREAN SECTION;  Surgeon: Alwyn Pea, MD;  Location: [facility];  Service: Obstetrics;  Laterality: N/A;   COSMETIC SURGERY  11/22/2017   Abdominoplasty   FOOT SURGERY     FRACTURE SURGERY  08/1999   Repair of 5th metatarsal in left foot   HERNIA REPAIR  11/22/2017   NIPPLE SPARING MASTECTOMY Left 12/07/2021   Procedure: LEFT NIPPLE SPARING MASTECTOMY;  Surgeon: Rolm Bookbinder, MD;  Location: [place];  Service: General;  Laterality: Left;   NIPPLE SPARING MASTECTOMY WITH SENTINEL LYMPH NODE  BIOPSY Right 12/07/2021   Procedure: RIGHT NIPPLE SPARING MASTECTOMY WITH AXILLARY SENTINEL LYMPH NODE BIOPSY;  Surgeon: Rolm Bookbinder, MD;  Location: [place];  Service: General;  Laterality: Right;   PORTACATH PLACEMENT N/A 06/24/2021   Procedure: INSERTION PORT-A-CATH;  Surgeon: Coralie Keens, MD;  Location: [place];  Service: General;  Laterality: N/A;   TUBAL LIGATION  11/22/2017   Tubes were completely removed   WISDOM TOOTH EXTRACTION     Patient Active Problem List   Diagnosis Date Noted   Breast cancer, right ([place]) 12/07/2021   Port-A-Cath in place 08/10/2021   Genetic testing 06/17/2021   Family history of breast cancer 06/10/2021   Family history of ovarian cancer 06/10/2021   Family history of BRCA gene mutation 06/10/2021   Malignant neoplasm of upper-outer quadrant of right breast in female, estrogen receptor positive ([place]) 06/08/2021   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 She underwent  neoadjuvant chemotherapy and then had bilateral mastectomies with a right sentinel node biopsy (4 negative nodes) on 12/07/2021. 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   PRECAUTIONS: right UE Lymphedema risk, None  SUBJECTIVE: Pt returns for her 3 month L-Dex screen .  PAIN:  Are you having pain? No  SOZO SCREENING: Patient was assessed today using the SOZO machine to determine the lymphedema  index score. This was compared to her baseline score. It was determined that she is within the recommended range when compared to her baseline and no further action is needed at this time. She will continue SOZO screenings. These are done every 3 months for 2 years post operatively followed by every 6 months for 2 years, and then annually.    [name], PTA 02/25/2022, 12:21 PM

## 2022-02-26 ENCOUNTER — Ambulatory Visit
Admission: RE | Admit: 2022-02-26 | Discharge: 2022-02-26 | Disposition: A | Discharge status: Home / Self Care | Payer: No Typology Code available for payment source | Source: Ambulatory Visit | Attending: Radiation Oncology | Admitting: Radiation Oncology

## 2022-02-26 ENCOUNTER — Other Ambulatory Visit: Payer: Self-pay

## 2022-02-26 DIAGNOSIS — C50412 Malignant neoplasm of upper-outer quadrant of left female breast: Secondary | ICD-10-CM | POA: Diagnosis not present

## 2022-02-26 LAB — RAD ONC ARIA SESSION SUMMARY
Course Elapsed Days: 37
Plan Fractions Treated to Date: 27
Plan Prescribed Dose Per Fraction: 1.8 Gy
Plan Total Fractions Prescribed: 28
Plan Total Prescribed Dose: 50.4 Gy
Reference Point Dosage Given to Date: 48.6 Gy
Reference Point Session Dosage Given: 1.8 Gy
Session Number: 27

## 2022-03-01 ENCOUNTER — Encounter: Payer: Self-pay | Admitting: Radiation Oncology

## 2022-03-01 ENCOUNTER — Ambulatory Visit
Admission: RE | Admit: 2022-03-01 | Discharge: 2022-03-01 | Disposition: A | Discharge status: Home / Self Care | Payer: No Typology Code available for payment source | Source: Ambulatory Visit | Attending: Radiation Oncology | Admitting: Radiation Oncology

## 2022-03-01 ENCOUNTER — Other Ambulatory Visit: Payer: Self-pay

## 2022-03-01 ENCOUNTER — Ambulatory Visit: Payer: Self-pay | Admitting: Physical Therapy

## 2022-03-01 DIAGNOSIS — C50412 Malignant neoplasm of upper-outer quadrant of left female breast: Secondary | ICD-10-CM | POA: Diagnosis not present

## 2022-03-01 LAB — RAD ONC ARIA SESSION SUMMARY
Course Elapsed Days: 40
Plan Fractions Treated to Date: 28
Plan Prescribed Dose Per Fraction: 1.8 Gy
Plan Total Fractions Prescribed: 28
Plan Total Prescribed Dose: 50.4 Gy
Reference Point Dosage Given to Date: 50.4 Gy
Reference Point Session Dosage Given: 1.8 Gy
Session Number: 28

## 2022-03-04 ENCOUNTER — Other Ambulatory Visit: Payer: Self-pay | Admitting: Nurse Practitioner

## 2022-03-19 ENCOUNTER — Encounter: Payer: Self-pay | Admitting: Hematology and Oncology

## 2022-03-19 NOTE — Progress Notes (Signed)
                                                                                                                                                             Patient Name: Ebony Lamb MRN: [id number] DOB: December 06, 1975 Referring Physician: Nicholas Lose (Profile Not Attached) Date of Service: 03/01/2022 [facility], [place]                                                        End Of Treatment Note  Diagnoses: C50.211-Malignant neoplasm of upper-inner quadrant of right female breast C50.411-Malignant neoplasm of upper-outer quadrant of right female breast  Cancer Staging:  Cancer Staging  Malignant neoplasm of upper-outer quadrant of right breast in female, estrogen receptor positive ([place]) Staging form: Breast, AJCC 8th Edition - Clinical stage from 06/10/2021: Stage IIA (cT2, cN0, cM0, G3, ER+, PR+, HER2-) - Signed by Chauncey Cruel, MD on 06/10/2021 Stage prefix: Initial diagnosis Histologic grading system: 3 grade system Laterality: Right Staged by: Pathologist and managing physician Stage used in treatment planning: Yes National guidelines used in treatment planning: Yes Type of national guideline used in treatment planning: NCCN  Intent: Curative  Radiation Treatment Dates: 01/20/2022 through 03/01/2022 Site Technique Total Dose (Gy) Dose per Fx (Gy) Completed Fx Beam Energies  Chest Wall, Right: CW_R 3D 50.4/50.4 1.8 28/28 6XFFF   Narrative: The patient tolerated radiation therapy relatively well.   Plan: The patient will follow-up with radiation oncology in 26mo -----------------------------------  SEppie Gibson MD

## 2022-03-23 ENCOUNTER — Inpatient Hospital Stay: Payer: No Typology Code available for payment source | Attending: Oncology | Admitting: Adult Health

## 2022-03-23 ENCOUNTER — Other Ambulatory Visit: Payer: Self-pay

## 2022-03-23 VITALS — BP 117/80 | HR 85 | Temp 98.2°F | Resp 16 | Ht 66.0 in | Wt 118.8 lb

## 2022-03-23 DIAGNOSIS — C50411 Malignant neoplasm of upper-outer quadrant of right female breast: Secondary | ICD-10-CM | POA: Insufficient documentation

## 2022-03-23 DIAGNOSIS — M549 Dorsalgia, unspecified: Secondary | ICD-10-CM | POA: Insufficient documentation

## 2022-03-23 DIAGNOSIS — Z170 Estrogen receptor positive status [ER+]: Secondary | ICD-10-CM

## 2022-03-23 DIAGNOSIS — Z9012 Acquired absence of left breast and nipple: Secondary | ICD-10-CM | POA: Insufficient documentation

## 2022-03-23 DIAGNOSIS — Z79810 Long term (current) use of selective estrogen receptor modulators (SERMs): Secondary | ICD-10-CM | POA: Insufficient documentation

## 2022-03-23 DIAGNOSIS — Z171 Estrogen receptor negative status [ER-]: Secondary | ICD-10-CM | POA: Insufficient documentation

## 2022-03-23 DIAGNOSIS — Z8261 Family history of arthritis: Secondary | ICD-10-CM | POA: Diagnosis not present

## 2022-03-23 DIAGNOSIS — I10 Essential (primary) hypertension: Secondary | ICD-10-CM | POA: Diagnosis not present

## 2022-03-23 NOTE — Progress Notes (Signed)
SURVIVORSHIP VISIT:  BRIEF ONCOLOGIC HISTORY:  Oncology History  Malignant neoplasm of upper-outer quadrant of right breast in female, estrogen receptor positive ([place])  06/04/2021 Initial Diagnosis   T2 N0 IDC, grade 3, ER 15% weak to moderate staining, PR 15% weak to moderate staining (functionally triple negative with HER2 not amplified), with a Ki-67 of 70% (breast MRI 3.1 cm mass)   06/10/2021 Cancer Staging   Staging form: Breast, AJCC 8th Edition - Clinical stage from 06/10/2021: Stage IIA (cT2, cN0, cM0, G3, ER+, PR+, HER2-) - Signed by Chauncey Cruel, MD on 06/10/2021 Stage prefix: Initial diagnosis Histologic grading system: 3 grade system Laterality: Right Staged by: Pathologist and managing physician Stage used in treatment planning: Yes National guidelines used in treatment planning: Yes Type of national guideline used in treatment planning: NCCN   06/16/2021 Genetic Testing   [facility] CancerNext-Expanded was negative. No pathogenic variants were identified. 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 Report date is 06/19/2021.  The CancerNext-Expanded gene panel offered by [facility] and includes sequencing, rearrangement, and RNA analysis for the following 77 genes: AIP, ALK, APC, ATM, AXIN2, BAP1, BARD1, BLM, BMPR1A, BRCA1, BRCA2, BRIP1, CDC73, CDH1, CDK4, CDKN1B, CDKN2A, CHEK2, CTNNA1, DICER1, FANCC, FH, FLCN, GALNT12, KIF1B, LZTR1, MAX, MEN1, MET, MLH1, MSH2, MSH3, MSH6, MUTYH, NBN, NF1, NF2, NTHL1, PALB2, PHOX2B, PMS2, POT1, PRKAR1A, PTCH1, PTEN, RAD51C, RAD51D, RB1, RECQL, RET, SDHA, SDHAF2, SDHB, SDHC, SDHD, SMAD4, SMARCA4, SMARCB1, SMARCE1, STK11, SUFU, TMEM127, TP53, TSC1, TSC2, VHL and XRCC2 (sequencing and deletion/duplication); EGFR, EGLN1, HOXB13, KIT, MITF, PDGFRA, POLD1, and POLE (sequencing only); EPCAM and GREM1 (deletion/duplication only).    06/25/2021 -  Neo-Adjuvant Chemotherapy   neoadjuvant chemotherapy consisting of  doxorubicin and cyclophosphamide in dose dense fashion x4 started 0/13/2022, completed 08/10/2021, to be followed by weekly carboplatin and paclitaxel x12 starting 08/24/2021   12/07/2021 Surgery   Left mastectomy: Benign Right mastectomy: Negative for residual cancer, 0/4 lymph nodes negative pathologic complete response,   01/11/2022 -  Anti-estrogen oral therapy   Tamoxifen x 10 years   01/20/2022 - 03/01/2022 Radiation Therapy   Site Technique Total Dose (Gy) Dose per Fx (Gy) Completed Fx Beam Energies  Chest Wall, Right: CW_R 3D 50.4/50.4 1.8 28/28 6XFFF       INTERVAL HISTORY:  Ms. Klump to review her survivorship care plan detailing her treatment course for breast cancer, as well as monitoring long-term side effects of that treatment, education regarding health maintenance, screening, and overall wellness and health promotion.     Overall, Ms. Langner reports feeling quite well.  She is taking tamoxifen daily with good tolerance.  She denies any significant issues.  She has some lower back pain that began in 01/2022 dull ache.  Worse with activity.  When resting doesn't hurt as much.  No precipitating event.  Started feeling like it was in her SI joint.  moved up from si joint to lower back.  Signatera testing was neg in 02/2022.  Physical therapy for arm.  Nail changes in right great toe and she wants to know what to do about this.    REVIEW OF SYSTEMS:  Review of Systems  Constitutional:  Negative for appetite change, chills, fatigue, fever and unexpected weight change.  HENT:   Negative for hearing loss, lump/mass and trouble swallowing.   Eyes:  Negative for eye problems and icterus.  Respiratory:  Negative for chest tightness, cough and shortness of breath.   Cardiovascular:  Negative for chest pain,  leg swelling and palpitations.  Gastrointestinal:  Negative for abdominal distention, abdominal pain, constipation, diarrhea, nausea and vomiting.  Endocrine: Negative for hot flashes.   Genitourinary:  Negative for difficulty urinating.   Musculoskeletal:  Positive for back pain. Negative for arthralgias.  Skin:  Negative for itching and rash.  Neurological:  Negative for dizziness, extremity weakness, headaches and numbness.  Hematological:  Negative for adenopathy. Does not bruise/bleed easily.  Psychiatric/Behavioral:  Negative for depression. The patient is not nervous/anxious.   Breast: Denies any new nodularity, masses, tenderness, nipple changes, or nipple discharge.      ONCOLOGY TREATMENT TEAM:  1. Surgeon:  Dr. Donne Hazel at [facility] 2. Medical Oncologist: Dr. Lindi Adie  3. Radiation Oncologist: Dr. Isidore Moos    PAST MEDICAL/SURGICAL HISTORY:  Past Medical History:  Diagnosis Date  . Abnormal Pap smear 09/13/2000  . Allergy 02/2020   Seasonal  . Breast cancer ([place])   . Depression    history of depression- denies currently  . Hypertension   . Preterm labor    Past Surgical History:  Procedure Laterality Date  . BREAST BIOPSY Right 07/27/2019  . BREAST RECONSTRUCTION WITH PLACEMENT OF TISSUE EXPANDER AND ALLODERM Bilateral 12/07/2021   Procedure: BREAST RECONSTRUCTION WITH PLACEMENT OF TISSUE EXPANDER AND ALLODERM;  Surgeon: Irene Limbo, MD;  Location: [place];  Service: Plastics;  Laterality: Bilateral;  . CESAREAN SECTION  11/14/2011   Procedure: CESAREAN SECTION;  Surgeon: Eli Hose, MD;  Location: [facility];  Service: Gynecology;  Laterality: N/A;  Primary cesarean section with delivery of baby boy at [phone]. Apgars 9/9.  Marland Kitchen CESAREAN SECTION N/A 03/22/2014   Procedure: Repeat CESAREAN SECTION;  Surgeon: Alwyn Pea, MD;  Location: [facility];  Service: Obstetrics;  Laterality: N/A;  . COSMETIC SURGERY  11/22/2017   Abdominoplasty  . FOOT SURGERY    . FRACTURE SURGERY  08/1999   Repair of 5th metatarsal in left foot  . HERNIA REPAIR  11/22/2017  . NIPPLE SPARING MASTECTOMY Left 12/07/2021   Procedure: LEFT  NIPPLE SPARING MASTECTOMY;  Surgeon: Rolm Bookbinder, MD;  Location: [place];  Service: General;  Laterality: Left;  . NIPPLE SPARING MASTECTOMY WITH SENTINEL LYMPH NODE BIOPSY Right 12/07/2021   Procedure: RIGHT NIPPLE SPARING MASTECTOMY WITH AXILLARY SENTINEL LYMPH NODE BIOPSY;  Surgeon: Rolm Bookbinder, MD;  Location: [place];  Service: General;  Laterality: Right;  . PORTACATH PLACEMENT N/A 06/24/2021   Procedure: INSERTION PORT-A-CATH;  Surgeon: Coralie Keens, MD;  Location: [place];  Service: General;  Laterality: N/A;  . TUBAL LIGATION  11/22/2017   Tubes were completely removed  . WISDOM TOOTH EXTRACTION       ALLERGIES:  Allergies  Allergen Reactions  . Cefepime Rash     CURRENT MEDICATIONS:  Outpatient Encounter Medications as of 03/23/2022  Medication Sig  . loratadine (CLARITIN) 10 MG tablet Take 10 mg by mouth as needed for allergies.  . tamoxifen (NOLVADEX) 20 MG tablet Take 1 tablet (20 mg total) by mouth daily.  . [DISCONTINUED] ferrous sulfate (FERROUSUL) 325 (65 FE) MG tablet Take 1 tablet (325 mg total) by mouth 2 (two) times daily.  . [DISCONTINUED] loratadine (CLARITIN) 10 MG tablet Take 1 tablet (10 mg total) by mouth daily. Take day post chemo and then daily for 10 days with each treatment (Patient not taking: Reported on 03/23/2022)  . [DISCONTINUED] influenza vac split quadrivalent PF (FLUARIX) 0.5 ML injection   . [DISCONTINUED] influenza vac split  quadrivalent PF (FLUARIX) injection 0.5 mL    No facility-administered encounter medications on file as of 03/23/2022.     ONCOLOGIC FAMILY HISTORY:  Family History  Problem Relation Age of Onset  . Breast cancer Mother 48  . Osteopenia Mother   . Arthritis Mother   . Other Mother        BRCA2 gene mutation  . Ulcerative colitis Mother   . Hypertension Father   . Kidney disease Father   . Depression Father   . Hyperlipidemia Father   .  Melanoma Father 56  . Hypertension Brother   . Hyperlipidemia Brother   . Breast cancer Maternal Grandmother        dx. late 30s/40s  . Heart disease Maternal Grandfather   . Other Maternal Grandfather        BRCA2 gene mutation  . Ovarian cancer Paternal Grandmother        dx. 83s  . Stroke Paternal Grandfather   . Birth defects Son   . Anesthesia problems Neg Hx   . Diabetes Neg Hx       SOCIAL HISTORY:  Social History   Socioeconomic History  . Marital status: Married    Spouse name: Not on file  . Number of children: Not on file  . Years of education: Not on file  . Highest education level: Not on file  Occupational History  . Not on file  Tobacco Use  . Smoking status: Never  . Smokeless tobacco: Never  Vaping Use  . Vaping Use: Never used  Substance and Sexual Activity  . Alcohol use: Not Currently    Alcohol/week: 7.0 standard drinks of alcohol    Types: 7 Standard drinks or equivalent per week    Comment: I stopped drinking alcohol after finding the lump in my brea  . Drug use: No  . Sexual activity: Yes    Birth control/protection: Surgical  Other Topics Concern  . Not on file  Social History Narrative  . Not on file   Social Determinants of Health   Financial Resource Strain: Low Risk  (06/10/2021)   Overall Financial Resource Strain (CARDIA)   . Difficulty of Paying Living Expenses: Not very hard  Food Insecurity: No Food Insecurity (06/10/2021)   Hunger Vital Sign   . Worried About Charity fundraiser in the Last Year: Never true   . Ran Out of Food in the Last Year: Never true  Transportation Needs: No Transportation Needs (06/10/2021)   PRAPARE - Transportation   . Lack of Transportation (Medical): No   . Lack of Transportation (Non-Medical): No  Physical Activity: Not on file  Stress: Not on file  Social Connections: Not on file  Intimate Partner Violence: Not on file     OBSERVATIONS/OBJECTIVE:  BP 117/80 (BP Location: Left Arm, Patient  Position: Sitting)   Pulse 85   Temp 98.2 F (36.8 C) (Temporal)   Resp 16   Ht '5\' 6"'  (1.676 m)   Wt 118 lb 12.8 oz (53.9 kg)   SpO2 100%   BMI 19.17 kg/m  GENERAL: Patient is a well appearing female in no acute distress HEENT:  Sclerae anicteric.  Oropharynx clear and moist. No ulcerations or evidence of oropharyngeal candidiasis. Neck is supple.  NODES:  No cervical, supraclavicular, or axillary lymphadenopathy palpated.  BREAST EXAM:  Deferred. LUNGS:  Clear to auscultation bilaterally.  No wheezes or rhonchi. HEART:  Regular rate and rhythm. No murmur appreciated. ABDOMEN:  Soft, nontender.  Positive,  normoactive bowel sounds. No organomegaly palpated. MSK:  No focal spinal tenderness to palpation. Full range of motion bilaterally in the upper extremities. EXTREMITIES:  No peripheral edema.   SKIN:  Clear with no obvious rashes or skin changes. No nail dyscrasia. NEURO:  Nonfocal. Well oriented.  Appropriate affect.   LABORATORY DATA:  None for this visit.  DIAGNOSTIC IMAGING:  None for this visit.      ASSESSMENT AND PLAN:  Ms.. Alpert is a pleasant 46 y.o. female with Stage IIA right breast invasive ductal carcinoma, ER+/PR+/HER2-, diagnosed in 05/2021, treated with neoadjuvant chemotherapy, bilateral mastectomy, adjuvant radiation therapy, and anti-estrogen therapy with Tamoxifen beginning in 01/2022.  She presents to the Survivorship Clinic for our initial meeting and routine follow-up post-completion of treatment for breast cancer.    1. Stage IIA right/left breast cancer:  Ms. Carver is continuing to recover from definitive treatment for breast cancer. She will follow-up with her medical oncologist, Dr. Lindi Adie in 6 months with history and physical exam per surveillance protocol.  She will continue her anti-estrogen therapy with Tamoxifen.  If she has not started her menstrual cycle by December, she will call me and I will order labs before her appointment with Dr. Lindi Adie  including [facility], [place], and sensitive estradiol.  She is undergoing Signatera testing and her most recent results from 02/2022 were negative.   Today, a comprehensive survivorship care plan and treatment summary was reviewed with the patient today detailing her breast cancer diagnosis, treatment course, potential late/long-term effects of treatment, appropriate follow-up care with recommendations for the future, and patient education resources.  A copy of this summary, along with a letter will be sent to the patient's primary care provider via mail/fax/In Basket message after today's visit.    2. Back pain: With Signatera being negative, and the fact that her back pain has no high risk features it is unlikely this is recurrence, however, I do recommend xray since it has been persistent for several weeks.  I placed orders for xrays and she will undergo these at the end of July/early august once she returns from her trip.    #. Bone health:  Given Ms. Harju age/history of breast cancer and her current treatment regimen including anti-estrogen therapy with ***, she is at risk for bone demineralization.  Her last DEXA scan was ***, which showed ***.  In the meantime, she was encouraged to increase her consumption of foods rich in calcium, as well as increase her weight-bearing activities.  She was given education on specific activities to promote bone health.  #. Cancer screening:  Due to Ms. Spitler's history and her age, she should receive screening for skin cancers, colon cancer, and gynecologic cancers.  The information and recommendations are listed on the patient's comprehensive care plan/treatment summary and were reviewed in detail with the patient.    #. Health maintenance and wellness promotion: Ms. Juste was encouraged to consume 5-7 servings of fruits and vegetables per day. We reviewed the "Nutrition Rainbow" handout, as well as the handout "Take Control of Your Health and Reduce Your Cancer Risk" from the  [employer].  She was also encouraged to engage in moderate to vigorous exercise for 30 minutes per day most days of the week. We discussed the [employer] fitness program, which is designed for cancer survivors to help them become more physically fit after cancer treatments.  She was instructed to limit her alcohol consumption and continue to abstain from tobacco use/***was encouraged stop  smoking.     #. Support services/counseling: It is not uncommon for this period of the patient's cancer care trajectory to be one of many emotions and stressors.  We discussed how this can be increasingly difficult during the times of quarantine and social distancing due to the COVID-19 pandemic.   She was given information regarding our available services and encouraged to contact me with any questions or for help enrolling in any of our support group/programs.    Follow up instructions:    -Return to cancer center ***  -Mammogram due in *** -Follow up with surgery *** -She is welcome to return back to the Survivorship Clinic at any time; no additional follow-up needed at this time.  -Consider referral back to survivorship as a long-term survivor for continued surveillance  The patient was provided an opportunity to ask questions and all were answered. The patient agreed with the plan and demonstrated an understanding of the instructions.   Total encounter time:*** minutes*in face-to-face visit time, chart review, lab review, care coordination, order entry, and documentation of the encounter time.    Wilber Bihari, NP 03/23/22 10:49 AM Medical Oncology and Hematology [facility] [place], [place] [address] Tel. [phone]    Fax. [phone]  *Total Encounter Time as defined by the Centers for Medicare and Medicaid Services includes, in addition to the face-to-face time of a patient visit (documented in the note above) non-face-to-face time: obtaining and  reviewing outside history, ordering and reviewing medications, tests or procedures, care coordination (communications with other health care professionals or caregivers) and documentation in the medical record.

## 2022-03-24 ENCOUNTER — Telehealth: Payer: Self-pay | Admitting: Adult Health

## 2022-03-24 NOTE — Telephone Encounter (Signed)
Scheduled appointment per 7/11 los. Left voicemail.

## 2022-04-02 ENCOUNTER — Ambulatory Visit: Payer: Self-pay | Admitting: Radiation Oncology

## 2022-04-12 ENCOUNTER — Other Ambulatory Visit: Payer: Self-pay | Admitting: Podiatry

## 2022-04-12 ENCOUNTER — Ambulatory Visit (INDEPENDENT_AMBULATORY_CARE_PROVIDER_SITE_OTHER): Payer: No Typology Code available for payment source | Admitting: Podiatry

## 2022-04-12 DIAGNOSIS — B351 Tinea unguium: Secondary | ICD-10-CM

## 2022-04-12 DIAGNOSIS — L03031 Cellulitis of right toe: Secondary | ICD-10-CM | POA: Diagnosis not present

## 2022-04-12 MED ORDER — SULFAMETHOXAZOLE-TRIMETHOPRIM 800-160 MG PO TABS: 1.0000 | ORAL_TABLET | Freq: Two times a day (BID) | ORAL | 0 refills | Status: DC

## 2022-04-12 NOTE — Patient Instructions (Signed)

## 2022-04-12 NOTE — Progress Notes (Signed)
Subjective:   Patient ID: Ebony Lamb, female   DOB: 46 y.o.   MRN: [id number]   HPI Chief Complaint  Patient presents with   Nail Problem    right great toenail change Referring Provider: Wilber Bihari CORNETTO, ingrown toenail, started 2 mths ago, Had some drainage and pain bleeding, TX: use some cream , Has a history of nail fungus     46 year old female presents the above complaint.  She states that she feels had some pus come from the toenail and she gets discomfort in the toenail when it is bumped.  She is previously had fungal infections of the toenail.  She recently scheduled for vacation which was hiking and she was in water at times and her feet got wet.  She reviewed saw dermatologist as well as was told to soak in vinegar and recommend antibiotic ointment which she did do.   Review of Systems  All other systems reviewed and are negative.  Past Medical History:  Diagnosis Date   Abnormal Pap smear 09/13/2000   Allergy 02/2020   Seasonal   Breast cancer ([place])    Depression    history of depression- denies currently   Hypertension    Preterm labor     Past Surgical History:  Procedure Laterality Date   BREAST BIOPSY Right 07/27/2019   BREAST RECONSTRUCTION WITH PLACEMENT OF TISSUE EXPANDER AND ALLODERM Bilateral 12/07/2021   Procedure: BREAST RECONSTRUCTION WITH PLACEMENT OF TISSUE EXPANDER AND ALLODERM;  Surgeon: Irene Limbo, MD;  Location: [facility];  Service: Plastics;  Laterality: Bilateral;   CESAREAN SECTION  11/14/2011   Procedure: CESAREAN SECTION;  Surgeon: Eli Hose, MD;  Location: [facility];  Service: Gynecology;  Laterality: N/A;  Primary cesarean section with delivery of baby boy at [phone]. Apgars 9/9.   CESAREAN SECTION N/A 03/22/2014   Procedure: Repeat CESAREAN SECTION;  Surgeon: Alwyn Pea, MD;  Location: [facility];  Service: Obstetrics;  Laterality: N/A;   COSMETIC SURGERY  11/22/2017   Abdominoplasty   FOOT SURGERY      FRACTURE SURGERY  08/1999   Repair of 5th metatarsal in left foot   HERNIA REPAIR  11/22/2017   NIPPLE SPARING MASTECTOMY Left 12/07/2021   Procedure: LEFT NIPPLE SPARING MASTECTOMY;  Surgeon: Rolm Bookbinder, MD;  Location: [facility] [facility];  Service: General;  Laterality: Left;   NIPPLE SPARING MASTECTOMY WITH SENTINEL LYMPH NODE BIOPSY Right 12/07/2021   Procedure: RIGHT NIPPLE SPARING MASTECTOMY WITH AXILLARY SENTINEL LYMPH NODE BIOPSY;  Surgeon: Rolm Bookbinder, MD;  Location: [place];  Service: General;  Laterality: Right;   PORTACATH PLACEMENT N/A 06/24/2021   Procedure: INSERTION PORT-A-CATH;  Surgeon: Coralie Keens, MD;  Location: [facility];  Service: General;  Laterality: N/A;   TUBAL LIGATION  11/22/2017   Tubes were completely removed   WISDOM TOOTH EXTRACTION       Current Outpatient Medications:    sulfamethoxazole-trimethoprim (BACTRIM DS) 800-160 MG tablet, Take 1 tablet by mouth 2 (two) times daily., Disp: 14 tablet, Rfl: 0   loratadine (CLARITIN) 10 MG tablet, Take 10 mg by mouth as needed for allergies., Disp: , Rfl:    tamoxifen (NOLVADEX) 20 MG tablet, Take 1 tablet (20 mg total) by mouth daily., Disp: 90 tablet, Rfl: 3  Allergies  Allergen Reactions   Cefepime Rash           Objective:  Physical Exam  General: AAO x3, NAD  Dermatological: Right hallux toenail is  loose from the underlying nailbed distally.  The nail is hypertrophic and there is yellow, darkened discoloration of the nail.  No extensively hyperpigmentation in the surrounding skin.  Upon debridement of the nail there was purulence expressed from the nail border.  No ascending cellulitis.  Vascular: Dorsalis Pedis artery and Posterior Tibial artery pedal pulses are 2/4 bilateral with immedate capillary fill time. There is no pain with calf compression, swelling, warmth, erythema.   Neruologic: Grossly intact via light touch bilateral.    Musculoskeletal: Tenderness along the hallux toenail border.  No other area discomfort.  Muscular strength 5/5 in all groups tested bilateral.  Gait: Unassisted, Nonantalgic.       Assessment:   Ingrown toenail localized infection, abscess     Plan:  -Treatment options discussed including all alternatives, risks, and complications. -Etiology of symptoms were discussed -We discussed different treatment options with the nail fungus.  After discussion she wants to try to save the nail if possible.  I did debride the nail and sent this for culture, pathology and I sent this to [facility] labs. -During debridement there was purulence noted from the nail border.  Nail somewhat loose.  Able debride the loose toenail.  No further purulence today.  I took a wound culture of this.  Prescribed Bactrim.  Recommend Epsom salt soaks and antibiotic ointment dressing changes daily.  She has Silvadene at home that she can use.  If symptoms continue or worsen will need to have the entire nail removed.  Trula Slade DPM

## 2022-04-13 ENCOUNTER — Other Ambulatory Visit: Payer: Self-pay

## 2022-04-13 ENCOUNTER — Ambulatory Visit
Admission: RE | Admit: 2022-04-13 | Discharge: 2022-04-13 | Disposition: A | Discharge status: Home / Self Care | Payer: No Typology Code available for payment source | Source: Ambulatory Visit | Attending: Radiation Oncology | Admitting: Radiation Oncology

## 2022-04-13 VITALS — BP 108/77 | HR 68 | Temp 97.6°F | Resp 18 | Ht 66.0 in | Wt 122.0 lb

## 2022-04-13 DIAGNOSIS — Z923 Personal history of irradiation: Secondary | ICD-10-CM | POA: Insufficient documentation

## 2022-04-13 DIAGNOSIS — Z170 Estrogen receptor positive status [ER+]: Secondary | ICD-10-CM | POA: Diagnosis not present

## 2022-04-13 DIAGNOSIS — C50411 Malignant neoplasm of upper-outer quadrant of right female breast: Secondary | ICD-10-CM | POA: Diagnosis present

## 2022-04-13 NOTE — Progress Notes (Signed)
Ebony Lamb presents today for follow-up after completing radiation to her right breast on 03/01/2022  Pain: Reports occasional discomfort along the backside of her right upper arm (but states it's manageable)  Skin: Redness has dissipated, but discolroation around areola remains. States she didn't experience any blistering or peeling after finishing radiation ROM: Reports occasional, mild tightness across her chest, but states it's tolerable Lymphedema: Denies MedOnc F/U: Saw Lindsey Causey-NP on 03/23/22, and will see Dr. Lindi Adie in 09/2022 Other issues of note: Reports lower back pain has resolved since recent trip to [place] with her family   Pt reports Yes No Comments  Tamoxifen '[x]'$  '[]'$  Reports hotflashes, but was experiencing prior to starting medication. Has not resumed menstrual cycle  Letrozole '[]'$  '[x]'$    Anastrazole '[]'$  '[x]'$    Mammogram '[]'$  Date: TBD '[]'$ 

## 2022-04-14 ENCOUNTER — Telehealth: Payer: Self-pay

## 2022-04-14 ENCOUNTER — Encounter: Payer: Self-pay | Admitting: Radiation Oncology

## 2022-04-14 NOTE — Progress Notes (Signed)
  Radiation Oncology         [phone] ________________________________  Name: Ebony Lamb MRN: [id number]  Date: 04/13/2022  DOB: 03-01-1976  Follow-Up Visit Note  Outpatient  CC: Antony Contras, MD  Nicholas Lose, MD  Diagnosis and Prior Radiotherapy:    ICD-10-CM   1. Malignant neoplasm of upper-outer quadrant of right breast in female, estrogen receptor positive ([place])  C50.411    Z17.0       CHIEF COMPLAINT: Here for follow-up and surveillance of breast cancer  Narrative:  The patient returns today for routine follow-up.  She is doing well.  She had a nice trip to the [facility].  She had some back pain that preceded the trip but it has resolved since then.  She is still trying to decide if she wants to proceed with some spinal x-rays as ordered by medical oncology.  She is transitioning to coconut oil and vitamin E over her right chest for further healing.                              ALLERGIES:  is allergic to cefepime.  Meds: Current Outpatient Medications  Medication Sig Dispense Refill   loratadine (CLARITIN) 10 MG tablet Take 10 mg by mouth as needed for allergies.     sulfamethoxazole-trimethoprim (BACTRIM DS) 800-160 MG tablet Take 1 tablet by mouth 2 (two) times daily. 14 tablet 0   tamoxifen (NOLVADEX) 20 MG tablet Take 1 tablet (20 mg total) by mouth daily. 90 tablet 3   No current facility-administered medications for this encounter.    Physical Findings: The patient is in no acute distress. Patient is alert and oriented.  height is '5\' 6"'$  (1.676 m) and weight is 122 lb (55.3 kg). Her temperature is 97.6 F (36.4 C). Her blood pressure is 108/77 and her pulse is 68. Her respiration is 18 and oxygen saturation is 100%. .    Right chest wall has healed beautifully.  It is difficult to tell which side of her chest had exposure to radiation therapy.  Fantastic healing.  Lab Findings: Lab Results  Component Value Date   WBC 7.8 11/13/2021   HGB 11.5 (L)  11/13/2021   HCT 33.0 (L) 11/13/2021   MCV 97.9 11/13/2021   PLT 170 11/13/2021    Radiographic Findings: No results found.  Impression/Plan: She is doing well after receiving radiation therapy to her right chest wall.  She will continue to follow with plastic surgery and medical oncology.  She will apply coconut oil and vitamin D twice a day for 2 more months to her skin.  We talked about the pros and cons of proceeding with a spinal x-ray to rule out metastatic disease.  She is going to make a decision about this later but it is very reassuring that her back pain has for the most part resolved.  I will see her back on an as-needed basis.  She knows to call with any questions or issues that I can help with.  I wished her the very best and she expressed gratitude  for the care that she received here.  On date of service, in total, I spent 20 minutes on this encounter. Patient was seen in person.  Note was signed today after encounter.  Minutes pertaining to day of encounter only. _____________________________________   Eppie Gibson, MD

## 2022-04-14 NOTE — Telephone Encounter (Signed)
Pt called to inform Ebony Lamb she has tested positive for COVID. She saw Dr Isidore Moos 04/13/22 and was not symptomatic at that time. Pt is asking for Ebony Lamb to advise. Per MD, pt should call PCP as she is not under active tx at this time. She was advised an immune support with daily doses of Vitamin C 1000 mg, Zinc 50 mg, and Vitamin D3 5,000 units OTC. She verbalized understanding and knows to call with any further concerns.

## 2022-04-15 LAB — WOUND CULTURE
MICRO NUMBER:: 13715075
SPECIMEN QUALITY:: ADEQUATE

## 2022-04-15 LAB — HOUSE ACCOUNT TRACKING

## 2022-04-16 ENCOUNTER — Encounter: Payer: Self-pay | Admitting: Adult Health

## 2022-04-21 ENCOUNTER — Telehealth: Payer: Self-pay

## 2022-04-21 NOTE — Telephone Encounter (Signed)
Attempted to call pt on provided personal number to give her news that Signatera test was negative/not detected.  Unable to LVM d/t message saying to enter an extension; no extension provided.

## 2022-04-26 ENCOUNTER — Ambulatory Visit (INDEPENDENT_AMBULATORY_CARE_PROVIDER_SITE_OTHER): Payer: No Typology Code available for payment source | Admitting: Podiatry

## 2022-04-26 DIAGNOSIS — L600 Ingrowing nail: Secondary | ICD-10-CM | POA: Diagnosis not present

## 2022-04-26 NOTE — Patient Instructions (Signed)

## 2022-04-28 ENCOUNTER — Ambulatory Visit (HOSPITAL_COMMUNITY)
Admission: RE | Admit: 2022-04-28 | Discharge: 2022-04-28 | Disposition: A | Discharge status: Home / Self Care | Payer: No Typology Code available for payment source | Source: Ambulatory Visit | Attending: Adult Health | Admitting: Adult Health

## 2022-04-28 DIAGNOSIS — Z170 Estrogen receptor positive status [ER+]: Secondary | ICD-10-CM | POA: Insufficient documentation

## 2022-04-28 DIAGNOSIS — C50411 Malignant neoplasm of upper-outer quadrant of right female breast: Secondary | ICD-10-CM | POA: Insufficient documentation

## 2022-04-28 NOTE — Progress Notes (Signed)
Subjective: Chief Complaint  Patient presents with   Nail Problem    Pt came in for a follow-up for a left foot nail infection, pt is have a little pain, there is some drainage and bleeding, NO swelling or redness TX: bactrim    46 year old female presents the office today for follow-up evaluation of left hallux toenail infection.  Still in some mild discomfort.  No swelling or redness.  She has been supportive antibiotics.  She is soaking Epsom salts intermittently.  No fevers or chills.  No other concerns.  Objective: AAO x3, NAD DP/PT pulses palpable bilaterally, CRT less than 3 seconds Along the left hallux toenail is healing lateral nail borders loosening underlying nailbed distally.  There is some bruising present the nail.  There is no drainage or pus but there is localized edema on the nail border and lateral aspect.  No pain with calf compression, swelling, warmth, erythema  Assessment: Ingrown toenail, onychodystrophy left hallux  Plan: -All treatment options discussed with the patient including all alternatives, risks, complications.  -Given still some soreness and concern for residual infection discussed with her at least partial nail avulsion.  Discussed with her possible need for total nail avulsion as well.  -At this time, recommended partial nail removal without chemical matricectomy to the lateral due to infection.  Discussed that during the procedure if needed we will remove the entire toenail.  Risks and complications were discussed with the patient for which they understand and  verbally consent to the procedure. Under sterile conditions a total of 3 mL of a mixture of 2% lidocaine plain and 0.5% Marcaine plain was infiltrated in a hallux block fashion. Once anesthetized, the skin was prepped in sterile fashion. A tourniquet was then applied. Next the lateral border of the hallux nail border was sharply excised making sure to remove the entire offending nail border.  One third  of the lateral border of the remaining nail appear to be firmly adhered.  There is no drainage or pus noted today and the underlying nailbed intact.  Once the nail was removed, the area was debrided and the underlying skin was intact. The area was irrigated and hemostasis was obtained.  A dry sterile dressing was applied. After application of the dressing the tourniquet was removed and there is found to be an immediate capillary refill time to the digit. The patient tolerated the procedure well any complications. Post procedure instructions were discussed the patient for which he verbally understood. Follow-up in one week for nail check or sooner if any problems are to arise. Discussed signs/symptoms of worsening infection and directed to call the office immediately should any occur or go directly to the emergency room. In the meantime, encouraged to call the office with any questions, concerns, changes symptoms. -Patient encouraged to call the office with any questions, concerns, change in symptoms.   Trula Slade DPM

## 2022-05-06 ENCOUNTER — Other Ambulatory Visit: Payer: Self-pay | Admitting: *Deleted

## 2022-05-06 DIAGNOSIS — G8929 Other chronic pain: Secondary | ICD-10-CM

## 2022-05-06 DIAGNOSIS — Z170 Estrogen receptor positive status [ER+]: Secondary | ICD-10-CM

## 2022-05-10 ENCOUNTER — Ambulatory Visit (INDEPENDENT_AMBULATORY_CARE_PROVIDER_SITE_OTHER): Payer: No Typology Code available for payment source | Admitting: Podiatry

## 2022-05-10 ENCOUNTER — Ambulatory Visit (HOSPITAL_COMMUNITY)
Admission: RE | Admit: 2022-05-10 | Discharge: 2022-05-10 | Disposition: A | Discharge status: Home / Self Care | Payer: No Typology Code available for payment source | Source: Ambulatory Visit | Attending: Hematology and Oncology | Admitting: Hematology and Oncology

## 2022-05-10 DIAGNOSIS — C50411 Malignant neoplasm of upper-outer quadrant of right female breast: Secondary | ICD-10-CM | POA: Insufficient documentation

## 2022-05-10 DIAGNOSIS — M545 Low back pain, unspecified: Secondary | ICD-10-CM | POA: Diagnosis present

## 2022-05-10 DIAGNOSIS — Z170 Estrogen receptor positive status [ER+]: Secondary | ICD-10-CM | POA: Insufficient documentation

## 2022-05-10 DIAGNOSIS — G8929 Other chronic pain: Secondary | ICD-10-CM | POA: Insufficient documentation

## 2022-05-10 DIAGNOSIS — L600 Ingrowing nail: Secondary | ICD-10-CM

## 2022-05-10 NOTE — Patient Instructions (Signed)

## 2022-05-12 NOTE — Progress Notes (Signed)
Subjective: Chief Complaint  Patient presents with   Ingrown Toenail    Pt came for a right foot hallux ingrown nail check, pt is doing well       46 year old female presents the office today for follow-up evaluation of left hallux toenail infection.  She states that she is doing better since the procedure.  Still some mild tenderness but no drainage or pus.  Objective: AAO x3, NAD DP/PT pulses palpable bilaterally, CRT less than 3 seconds Status post partial nail avulsion.  The remaining toenail appears to be clear.  The small mount of granulation tissue present in the nail bed.  There is no edema, erythema.  There is no drainage or pus.  No fluctuance or crepitation.  No malodor. No pain with calf compression, swelling, warmth, erythema  Assessment: Ingrown toenail, onychodystrophy left hallux  Plan: -All treatment options discussed with the patient including all alternatives, risks, complications.  -Overall appears to be doing better.  The remaining toenail partially clear.  Will wash with soap and water daily, apply antibiotic ointment and a bandage of the day but leave the area open at nighttime.  I would continue this until the wound is healed.  Monitor for any signs or symptoms of infection or any reoccurrence.  Return in about 4 weeks (around 06/07/2022), or if symptoms worsen or fail to improve, for ingrown toenail.  Trula Slade DPM

## 2022-05-26 ENCOUNTER — Ambulatory Visit: Payer: No Typology Code available for payment source | Attending: Surgery

## 2022-05-26 ENCOUNTER — Encounter: Payer: Self-pay | Admitting: Adult Health

## 2022-05-26 VITALS — Wt 124.5 lb

## 2022-05-26 DIAGNOSIS — Z483 Aftercare following surgery for neoplasm: Secondary | ICD-10-CM | POA: Insufficient documentation

## 2022-05-26 NOTE — Therapy (Signed)
OUTPATIENT PHYSICAL THERAPY SOZO SCREENING NOTE   Patient Name: Ebony Lamb MRN: [id number] DOB:1976/04/16, 46 y.o., female Today's Date: 05/26/2022  PCP: Antony Contras, MD REFERRING PROVIDER: Coralie Keens, MD   PT End of Session - 05/26/22 1206     Visit Number 13   # unchanged due to screen only   PT Start Time 9532    PT Stop Time 1207    PT Time Calculation (min) 3 min    Activity Tolerance Patient tolerated treatment well    Behavior During Therapy [facility] for tasks assessed/performed             Past Medical History:  Diagnosis Date   Abnormal Pap smear 09/13/2000   Allergy 02/2020   Seasonal   Breast cancer ([place])    Depression    history of depression- denies currently   Hypertension    Preterm labor    Past Surgical History:  Procedure Laterality Date   BREAST BIOPSY Right 07/27/2019   BREAST RECONSTRUCTION WITH PLACEMENT OF TISSUE EXPANDER AND ALLODERM Bilateral 12/07/2021   Procedure: BREAST RECONSTRUCTION WITH PLACEMENT OF TISSUE EXPANDER AND ALLODERM;  Surgeon: Irene Limbo, MD;  Location: [place];  Service: Plastics;  Laterality: Bilateral;   CESAREAN SECTION  11/14/2011   Procedure: CESAREAN SECTION;  Surgeon: Eli Hose, MD;  Location: [facility];  Service: Gynecology;  Laterality: N/A;  Primary cesarean section with delivery of baby boy at [phone]. Apgars 9/9.   CESAREAN SECTION N/A 03/22/2014   Procedure: Repeat CESAREAN SECTION;  Surgeon: Alwyn Pea, MD;  Location: [facility];  Service: Obstetrics;  Laterality: N/A;   COSMETIC SURGERY  11/22/2017   Abdominoplasty   FOOT SURGERY     FRACTURE SURGERY  08/1999   Repair of 5th metatarsal in left foot   HERNIA REPAIR  11/22/2017   NIPPLE SPARING MASTECTOMY Left 12/07/2021   Procedure: LEFT NIPPLE SPARING MASTECTOMY;  Surgeon: Rolm Bookbinder, MD;  Location: [place];  Service: General;  Laterality: Left;   NIPPLE SPARING MASTECTOMY WITH SENTINEL LYMPH NODE  BIOPSY Right 12/07/2021   Procedure: RIGHT NIPPLE SPARING MASTECTOMY WITH AXILLARY SENTINEL LYMPH NODE BIOPSY;  Surgeon: Rolm Bookbinder, MD;  Location: [place];  Service: General;  Laterality: Right;   PORTACATH PLACEMENT N/A 06/24/2021   Procedure: INSERTION PORT-A-CATH;  Surgeon: Coralie Keens, MD;  Location: [place];  Service: General;  Laterality: N/A;   TUBAL LIGATION  11/22/2017   Tubes were completely removed   WISDOM TOOTH EXTRACTION     Patient Active Problem List   Diagnosis Date Noted   Breast cancer, right ([place]) 12/07/2021   Port-A-Cath in place 08/10/2021   Genetic testing 06/17/2021   Family history of breast cancer 06/10/2021   Family history of ovarian cancer 06/10/2021   Family history of BRCA gene mutation 06/10/2021   Malignant neoplasm of upper-outer quadrant of right breast in female, estrogen receptor positive ([place]) 06/08/2021   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 She underwent neoadjuvant  chemotherapy and then had bilateral mastectomies with a right sentinel node biopsy (4 negative nodes) on 12/07/2021. 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   PRECAUTIONS: right UE Lymphedema risk, None  SUBJECTIVE: Pt returns for her 3 month L-Dex screen .  PAIN:  Are you having pain? No  SOZO SCREENING: Patient was assessed today using the SOZO machine to determine the lymphedema  index score. This was compared to her baseline score. It was determined that she is within the recommended range when compared to her baseline and no further action is needed at this time. She will continue SOZO screenings. These are done every 3 months for 2 years post operatively followed by every 6 months for 2 years, and then annually.   L-DEX FLOWSHEETS - 05/26/22 1200       L-DEX LYMPHEDEMA SCREENING   Measurement Type Unilateral    L-DEX MEASUREMENT EXTREMITY Upper Extremity    POSITION  Standing    DOMINANT SIDE Right    At Risk Side Right    BASELINE SCORE (UNILATERAL) -0.7    L-DEX SCORE (UNILATERAL) -2    VALUE CHANGE (UNILAT) -1.3              [name], PTA 05/26/2022, 12:08 PM

## 2022-06-09 ENCOUNTER — Encounter: Payer: Self-pay | Admitting: Genetic Counselor

## 2022-06-10 ENCOUNTER — Ambulatory Visit (INDEPENDENT_AMBULATORY_CARE_PROVIDER_SITE_OTHER): Payer: No Typology Code available for payment source | Admitting: Podiatry

## 2022-06-10 DIAGNOSIS — L600 Ingrowing nail: Secondary | ICD-10-CM

## 2022-06-10 DIAGNOSIS — L603 Nail dystrophy: Secondary | ICD-10-CM

## 2022-06-10 NOTE — Progress Notes (Signed)
Subjective: Chief Complaint  Patient presents with   Ingrown Toenail    Pt came for a right foot hallux ingrown nail check, pt is doing well     46 year old female presents the office today for follow-up evaluation of left hallux toenail infection.  She states she is doing well.  She no longer has any pain with the nail she denies any swelling or redness or any drainage.  She has noticed a scab.  No redness.   Objective: AAO x3, NAD DP/PT pulses palpable bilaterally, CRT less than 3 seconds Status post partial nail avulsion.  Small scab is present slightly decreasing this today.  No open wound.  There is no edema, erythema, drainage or pus or signs of infection.  There is no pain on exam. No pain with calf compression, swelling, warmth, erythema  Assessment: Ingrown toenail, onychodystrophy left hallux- improving   Plan: -All treatment options discussed with the patient including all alternatives, risks, complications.  -She continues to improve.  No signs of infection or pain today.  Discussed washing with soap and water.  We will need to monitor the nails grows back in.  Discussed starting a biotin supplement to help.   RTC prn   Trula Slade DPM

## 2022-07-14 NOTE — H&P (Signed)
Subjective:     Patient ID: Ebony Lamb is a 46 y.o. female.   Follow-up     7 months post op bilateral mastectomies with immediate expander based reconstruction. Plan implant exchange later this month.   Presented with palpable right breast mass. Diagnostic MMG/US showed 3 cm mass in right breast 12 o'clock; possible 5 mm satellite lesion located 2.5 cm from dominant mass; abnormal right axillary LN. Biopsy breast mass showed IDC, ER 15% / PR 15%, both with weak-moderate staining intensity. HER2 equivocal by IHC, but negative by FISH. LN biopsy benign.   MRI demonstrated right breast mass 2.6 x 2.2 x 3.1 cm consistent with known malignancy. Suspected satellite nodule seen on [name] not well seen on MRI.  No abnormal appearing LN; biopsy concordant.    Staging scans negative. Completed neoadjuvant chemotherapy. Final MRI showed near complete resolution right UOQ mass and 1.1 cm area of enhancement surrounding the biopsy clip. No enhancement of 2 satellite lesions noted on [name] previously. No right axillary adenopathy.   Final pathology with complete pathologic response, 0/4 SLN. Plan tamoxifen for 5-10 years. Completed adjuvant radiation 6.19.23.    Mother, MGM with breast ca. Mother BRCA2 positive. Genetics with VUS PTCH1.   Prior B cup happy with this. Right mastectomy 168 g left 155 g   Patient has had prior abdominoplasty with Dr. Harlow Mares 2019.   Patient is an Forensic psychologist for [name]. Lives with spouse and 2 children.   Review of Systems      Objective:   Physical Exam Cardiovascular:     Rate and Rhythm: Normal rate and regular rhythm.     Heart sounds: Normal heart sounds.  Pulmonary:     Effort: Pulmonary effort is normal.     Breath sounds: Normal breath sounds.       Chest: Bilateral chest expanded  smaller height nipple left over right Rippling present mostly over left SN to nipple R 20.5 L 19.5 cm Midline to nipple R 9.5 L 9.5 cm Nipple to IMF R and L 6 cm to scar 7 cm  to fold    Assessment:     Right breast cancer overlapping sites ER+ Neoadjuvant chemotherapy S/p bilateral NSM right SLN bilateral prepectoral TE/ADM (Alloderm) reconstruction Adjuvant radiation    Plan:       Plan removal bilateral chest tissue expanders and placement silicone implants. Hold tamoxifen 7-10 d prior to surgery.   Reviewed increased risks reconstruction in setting RT including capsular contracture, wound healing problems which can present as exposure implant. Reviewed encouraging data with prepectoral position, use ADM with regards to radiation. However some type of autologous tissue reconstruction post radiation can decrease risks back to baseline. Counseled options of implant exchange alone, LD flap over implant, or return to Dr. Dorann Ou for purely autologous options. Reviewed donor site risks including weakness, seroma. Patient declines LD flap. Understands any complications from implant exchange may require removal and starting reconstruction process over in future with some type of flap.   Plan to move forward with implant exchange. I have previously counseled to defer any lipofilling so as not to disturb any potential flap donor sites Recommend HCG or capacity filled silicone implants as they may offer reduced risk visible rippling. Reviewed MRI or [name] surveillance for rupture with silicone implants. Reviewed risks BIA ALCL with textured devices, new reports SCC implant associated with breast implants. Reviewed shared risks rupture contracture need for additional surgery. Counseled implants are not permanent devices. Reviewed size in  part guided by width chest, cannot assure her cup size. She has elected for silicone, plan smooth round. Patient request highly cohesive gel implants.    Reviewed possible reduction right nipple to match left. Patient desires to proceed with this.   Additional risks including but not limited to bleeding hematoma seroma infection need for additional  surgeries asymmetry unacceptable cosmetic result damage to adjacent structures blood clots in legs or lungs.   Completed [facility] physician patient checklist.   Rx for tramadol and Bactrim given.   Natrelle 133S FV-11-T 300 ml tissue expanders placed bilateral  fill volume 250 ml saline  bilateral

## 2022-07-19 ENCOUNTER — Encounter (HOSPITAL_BASED_OUTPATIENT_CLINIC_OR_DEPARTMENT_OTHER): Payer: Self-pay | Admitting: Plastic Surgery

## 2022-07-19 ENCOUNTER — Other Ambulatory Visit: Payer: Self-pay

## 2022-07-26 MED ORDER — CHLORHEXIDINE GLUCONATE CLOTH 2 % EX PADS: 6.0000 | MEDICATED_PAD | Freq: Once | CUTANEOUS | Status: DC

## 2022-07-26 NOTE — Progress Notes (Signed)
      Enhanced Recovery after Surgery  Enhanced Recovery after Surgery is a protocol used to improve the stress on your body and your recovery after surgery.  Patient Instructions  The night before surgery:  No food after midnight. ONLY clear liquids after midnight  The day of surgery (if you do NOT have diabetes):  Drink ONE (1) Pre-Surgery Clear Ensure as directed.   This drink was given to you during your hospital  pre-op appointment visit. The pre-op nurse will instruct you on the time to drink the  Pre-Surgery Ensure depending on your surgery time. Finish the drink at the designated time by the pre-op nurse.  Nothing else to drink after completing the  Pre-Surgery Clear Ensure.  The day of surgery (if you have diabetes): Drink ONE (1) Gatorade 2 (G2) as directed. This drink was given to you during your hospital  pre-op appointment visit.  The pre-op nurse will instruct you on the time to drink the   Gatorade 2 (G2) depending on your surgery time. Color of the Gatorade may vary. Red is not allowed. Nothing else to drink after completing the  Gatorade 2 (G2).         If you have questions, please contact your surgeon's office.  Surgical soap given with written instructions

## 2022-07-27 ENCOUNTER — Encounter (HOSPITAL_BASED_OUTPATIENT_CLINIC_OR_DEPARTMENT_OTHER)
Admission: RE | Disposition: A | Discharge status: Home / Self Care | Payer: Self-pay | Source: Home / Self Care | Attending: Plastic Surgery

## 2022-07-27 ENCOUNTER — Ambulatory Visit (HOSPITAL_BASED_OUTPATIENT_CLINIC_OR_DEPARTMENT_OTHER): Payer: No Typology Code available for payment source | Admitting: Anesthesiology

## 2022-07-27 ENCOUNTER — Encounter (HOSPITAL_BASED_OUTPATIENT_CLINIC_OR_DEPARTMENT_OTHER): Payer: Self-pay | Admitting: Plastic Surgery

## 2022-07-27 ENCOUNTER — Other Ambulatory Visit: Payer: Self-pay

## 2022-07-27 ENCOUNTER — Ambulatory Visit (HOSPITAL_BASED_OUTPATIENT_CLINIC_OR_DEPARTMENT_OTHER)
Admission: RE | Admit: 2022-07-27 | Discharge: 2022-07-27 | Disposition: A | Discharge status: Home / Self Care | Payer: No Typology Code available for payment source | Attending: Plastic Surgery | Admitting: Plastic Surgery

## 2022-07-27 DIAGNOSIS — Z853 Personal history of malignant neoplasm of breast: Secondary | ICD-10-CM

## 2022-07-27 DIAGNOSIS — Z9013 Acquired absence of bilateral breasts and nipples: Secondary | ICD-10-CM | POA: Diagnosis not present

## 2022-07-27 DIAGNOSIS — Z421 Encounter for breast reconstruction following mastectomy: Secondary | ICD-10-CM | POA: Insufficient documentation

## 2022-07-27 DIAGNOSIS — Z01818 Encounter for other preprocedural examination: Secondary | ICD-10-CM

## 2022-07-27 DIAGNOSIS — Z45812 Encounter for adjustment or removal of left breast implant: Secondary | ICD-10-CM

## 2022-07-27 DIAGNOSIS — Z923 Personal history of irradiation: Secondary | ICD-10-CM | POA: Insufficient documentation

## 2022-07-27 DIAGNOSIS — I10 Essential (primary) hypertension: Secondary | ICD-10-CM | POA: Insufficient documentation

## 2022-07-27 DIAGNOSIS — Z803 Family history of malignant neoplasm of breast: Secondary | ICD-10-CM | POA: Insufficient documentation

## 2022-07-27 DIAGNOSIS — L905 Scar conditions and fibrosis of skin: Secondary | ICD-10-CM | POA: Diagnosis not present

## 2022-07-27 DIAGNOSIS — Z45811 Encounter for adjustment or removal of right breast implant: Secondary | ICD-10-CM

## 2022-07-27 HISTORY — PX: REMOVAL OF BILATERAL TISSUE EXPANDERS WITH PLACEMENT OF BILATERAL BREAST IMPLANTS: SHX6431

## 2022-07-27 HISTORY — PX: BREAST RECONSTRUCTION: SHX9

## 2022-07-27 LAB — POCT PREGNANCY, URINE: Preg Test, Ur: NEGATIVE

## 2022-07-27 SURGERY — REMOVAL, TISSUE EXPANDER, BREAST, BILATERAL, WITH BILATERAL IMPLANT IMPLANT INSERTION
Anesthesia: General | Site: Chest | Laterality: Right

## 2022-07-27 MED ORDER — EPHEDRINE 5 MG/ML INJ
INTRAVENOUS | Status: AC
  Filled 2022-07-27: qty 5

## 2022-07-27 MED ORDER — MEPERIDINE HCL 25 MG/ML IJ SOLN: 6.2500 mg | INTRAMUSCULAR | Status: DC | PRN

## 2022-07-27 MED ORDER — PROPOFOL 10 MG/ML IV BOLUS
INTRAVENOUS | Status: DC | PRN
  Administered 2022-07-27: 200 mg via INTRAVENOUS

## 2022-07-27 MED ORDER — ACETAMINOPHEN 500 MG PO TABS
1000.0000 mg | ORAL_TABLET | ORAL | Status: AC
  Administered 2022-07-27: 1000 mg via ORAL

## 2022-07-27 MED ORDER — HYDROMORPHONE HCL 1 MG/ML IJ SOLN
0.2500 mg | INTRAMUSCULAR | Status: DC | PRN
  Administered 2022-07-27: 0.25 mg via INTRAVENOUS

## 2022-07-27 MED ORDER — PROPOFOL 500 MG/50ML IV EMUL
INTRAVENOUS | Status: DC | PRN
  Administered 2022-07-27: 20 ug/kg/min via INTRAVENOUS

## 2022-07-27 MED ORDER — OXYCODONE HCL 5 MG PO TABS
ORAL_TABLET | ORAL | Status: AC
  Filled 2022-07-27: qty 1

## 2022-07-27 MED ORDER — LIDOCAINE 2% (20 MG/ML) 5 ML SYRINGE
INTRAMUSCULAR | Status: AC
  Filled 2022-07-27: qty 5

## 2022-07-27 MED ORDER — FENTANYL CITRATE (PF) 100 MCG/2ML IJ SOLN
INTRAMUSCULAR | Status: DC | PRN
  Administered 2022-07-27: 25 ug via INTRAVENOUS
  Administered 2022-07-27: 50 ug via INTRAVENOUS
  Administered 2022-07-27: 25 ug via INTRAVENOUS

## 2022-07-27 MED ORDER — DEXAMETHASONE SODIUM PHOSPHATE 4 MG/ML IJ SOLN
INTRAMUSCULAR | Status: DC | PRN
  Administered 2022-07-27: 5 mg via INTRAVENOUS

## 2022-07-27 MED ORDER — BUPIVACAINE HCL (PF) 0.5 % IJ SOLN
INTRAMUSCULAR | Status: AC
  Filled 2022-07-27: qty 30

## 2022-07-27 MED ORDER — SUCCINYLCHOLINE CHLORIDE 200 MG/10ML IV SOSY
PREFILLED_SYRINGE | INTRAVENOUS | Status: AC
  Filled 2022-07-27: qty 10

## 2022-07-27 MED ORDER — OXYCODONE HCL 5 MG PO TABS
5.0000 mg | ORAL_TABLET | Freq: Once | ORAL | Status: AC | PRN
  Administered 2022-07-27: 5 mg via ORAL

## 2022-07-27 MED ORDER — AMISULPRIDE (ANTIEMETIC) 5 MG/2ML IV SOLN: 10.0000 mg | Freq: Once | INTRAVENOUS | Status: DC | PRN

## 2022-07-27 MED ORDER — PROMETHAZINE HCL 25 MG/ML IJ SOLN: 6.2500 mg | INTRAMUSCULAR | Status: DC | PRN

## 2022-07-27 MED ORDER — ONDANSETRON HCL 4 MG/2ML IJ SOLN
INTRAMUSCULAR | Status: DC | PRN
  Administered 2022-07-27: 4 mg via INTRAVENOUS

## 2022-07-27 MED ORDER — ONDANSETRON HCL 4 MG/2ML IJ SOLN
INTRAMUSCULAR | Status: AC
  Filled 2022-07-27: qty 2

## 2022-07-27 MED ORDER — LACTATED RINGERS IV SOLN: INTRAVENOUS | Status: DC

## 2022-07-27 MED ORDER — DEXAMETHASONE SODIUM PHOSPHATE 10 MG/ML IJ SOLN
INTRAMUSCULAR | Status: AC
  Filled 2022-07-27: qty 1

## 2022-07-27 MED ORDER — ACETAMINOPHEN 500 MG PO TABS
ORAL_TABLET | ORAL | Status: AC
  Filled 2022-07-27: qty 2

## 2022-07-27 MED ORDER — FENTANYL CITRATE (PF) 100 MCG/2ML IJ SOLN
INTRAMUSCULAR | Status: AC
  Filled 2022-07-27: qty 2

## 2022-07-27 MED ORDER — BUPIVACAINE HCL (PF) 0.5 % IJ SOLN
INTRAMUSCULAR | Status: DC | PRN
  Administered 2022-07-27: 30 mL

## 2022-07-27 MED ORDER — SODIUM CHLORIDE 0.9 % IV SOLN
INTRAVENOUS | Status: DC | PRN
  Administered 2022-07-27: 500 mL

## 2022-07-27 MED ORDER — MIDAZOLAM HCL 2 MG/2ML IJ SOLN
INTRAMUSCULAR | Status: AC
  Filled 2022-07-27: qty 2

## 2022-07-27 MED ORDER — PHENYLEPHRINE 80 MCG/ML (10ML) SYRINGE FOR IV PUSH (FOR BLOOD PRESSURE SUPPORT)
PREFILLED_SYRINGE | INTRAVENOUS | Status: AC
  Filled 2022-07-27: qty 10

## 2022-07-27 MED ORDER — GABAPENTIN 300 MG PO CAPS
ORAL_CAPSULE | ORAL | Status: AC
  Filled 2022-07-27: qty 1

## 2022-07-27 MED ORDER — HYDROMORPHONE HCL 1 MG/ML IJ SOLN
INTRAMUSCULAR | Status: AC
  Filled 2022-07-27: qty 0.5

## 2022-07-27 MED ORDER — GABAPENTIN 300 MG PO CAPS
300.0000 mg | ORAL_CAPSULE | ORAL | Status: AC
  Administered 2022-07-27: 300 mg via ORAL

## 2022-07-27 MED ORDER — VANCOMYCIN HCL IN DEXTROSE 1-5 GM/200ML-% IV SOLN
1000.0000 mg | INTRAVENOUS | Status: AC
  Administered 2022-07-27: 1000 mg via INTRAVENOUS

## 2022-07-27 MED ORDER — ATROPINE SULFATE 0.4 MG/ML IV SOLN
INTRAVENOUS | Status: AC
  Filled 2022-07-27: qty 1

## 2022-07-27 MED ORDER — SODIUM CHLORIDE 0.9 % IV SOLN
INTRAVENOUS | Status: AC
  Filled 2022-07-27: qty 10

## 2022-07-27 MED ORDER — OXYCODONE HCL 5 MG/5ML PO SOLN: 5.0000 mg | Freq: Once | ORAL | Status: AC | PRN

## 2022-07-27 MED ORDER — LIDOCAINE HCL (CARDIAC) PF 100 MG/5ML IV SOSY
PREFILLED_SYRINGE | INTRAVENOUS | Status: DC | PRN
  Administered 2022-07-27: 60 mg via INTRAVENOUS

## 2022-07-27 MED ORDER — MIDAZOLAM HCL 5 MG/5ML IJ SOLN
INTRAMUSCULAR | Status: DC | PRN
  Administered 2022-07-27: 2 mg via INTRAVENOUS

## 2022-07-27 MED ORDER — CELECOXIB 200 MG PO CAPS
200.0000 mg | ORAL_CAPSULE | ORAL | Status: AC
  Administered 2022-07-27: 200 mg via ORAL

## 2022-07-27 MED ORDER — EPHEDRINE SULFATE (PRESSORS) 50 MG/ML IJ SOLN
INTRAMUSCULAR | Status: DC | PRN
  Administered 2022-07-27: 10 mg via INTRAVENOUS

## 2022-07-27 MED ORDER — CELECOXIB 200 MG PO CAPS
ORAL_CAPSULE | ORAL | Status: AC
  Filled 2022-07-27: qty 1

## 2022-07-27 SURGICAL SUPPLY — 93 items
ADH SKN CLS APL DERMABOND .7 (GAUZE/BANDAGES/DRESSINGS) ×2
APL PRP STRL LF DISP 70% ISPRP (MISCELLANEOUS) ×4
BAG DECANTER FOR FLEXI CONT (MISCELLANEOUS) ×2 IMPLANT
BALL CTTN LRG ABS STRL LF (GAUZE/BANDAGES/DRESSINGS)
BINDER BREAST 3XL (GAUZE/BANDAGES/DRESSINGS) IMPLANT
BINDER BREAST LRG (GAUZE/BANDAGES/DRESSINGS) IMPLANT
BINDER BREAST MEDIUM (GAUZE/BANDAGES/DRESSINGS) IMPLANT
BINDER BREAST XLRG (GAUZE/BANDAGES/DRESSINGS) IMPLANT
BINDER BREAST XXLRG (GAUZE/BANDAGES/DRESSINGS) IMPLANT
BLADE CLIPPER SURG (BLADE) IMPLANT
BLADE SURG 10 STRL SS (BLADE) ×4 IMPLANT
BLADE SURG 15 STRL LF DISP TIS (BLADE) ×2 IMPLANT
BLADE SURG 15 STRL SS (BLADE)
BNDG GAUZE DERMACEA FLUFF 4 (GAUZE/BANDAGES/DRESSINGS) ×4 IMPLANT
BNDG GZE DERMACEA 4 6PLY (GAUZE/BANDAGES/DRESSINGS) ×4
CANISTER SUCT 1200ML W/VALVE (MISCELLANEOUS) ×2 IMPLANT
CHLORAPREP W/TINT 26 (MISCELLANEOUS) ×4 IMPLANT
COTTONBALL LRG STERILE PKG (GAUZE/BANDAGES/DRESSINGS) IMPLANT
COVER BACK TABLE 60X90IN (DRAPES) ×2 IMPLANT
COVER MAYO STAND STRL (DRAPES) ×2 IMPLANT
DERMABOND ADVANCED .7 DNX12 (GAUZE/BANDAGES/DRESSINGS) ×4 IMPLANT
DRAIN CHANNEL 15F RND FF W/TCR (WOUND CARE) IMPLANT
DRAPE LAPAROSCOPIC ABDOMINAL (DRAPES) IMPLANT
DRAPE TOP ARMCOVERS (MISCELLANEOUS) ×2 IMPLANT
DRAPE U-SHAPE 76X120 STRL (DRAPES) ×2 IMPLANT
DRAPE UTILITY XL STRL (DRAPES) ×2 IMPLANT
ELECT BLADE 4.0 EZ CLEAN MEGAD (MISCELLANEOUS)
ELECT COATED BLADE 2.86 ST (ELECTRODE) ×2 IMPLANT
ELECT NDL BLADE 2-5/6 (NEEDLE) ×2 IMPLANT
ELECT NEEDLE BLADE 2-5/6 (NEEDLE) IMPLANT
ELECT REM PT RETURN 9FT ADLT (ELECTROSURGICAL) ×2
ELECTRODE BLDE 4.0 EZ CLN MEGD (MISCELLANEOUS) IMPLANT
ELECTRODE REM PT RTRN 9FT ADLT (ELECTROSURGICAL) ×2 IMPLANT
EVACUATOR SILICONE 100CC (DRAIN) IMPLANT
GAUZE PAD ABD 8X10 STRL (GAUZE/BANDAGES/DRESSINGS) ×4 IMPLANT
GAUZE XEROFORM 1X8 LF (GAUZE/BANDAGES/DRESSINGS) IMPLANT
GAUZE XEROFORM 5X9 LF (GAUZE/BANDAGES/DRESSINGS) IMPLANT
GLOVE BIO SURGEON STRL SZ 6 (GLOVE) ×4 IMPLANT
GOWN STRL REUS W/ TWL LRG LVL3 (GOWN DISPOSABLE) ×4 IMPLANT
GOWN STRL REUS W/TWL LRG LVL3 (GOWN DISPOSABLE) ×4
IMPL BREAST FULL SHL 335 (Breast) IMPLANT
IMPL BREAST P4.6XFULL RND 325 (Breast) IMPLANT
IMPL BRST FULL SHL 335CC (Breast) ×2 IMPLANT
IMPL BRST P4.6XFULL RND 325CC (Breast) ×2 IMPLANT
IMPLANT BREAST GEL 325CC (Breast) ×2 IMPLANT
IMPLANT BREAST GEL 335CC (Breast) ×2 IMPLANT
KIT FILL ASEPTIC TRANSFER (MISCELLANEOUS) IMPLANT
MARKER SKIN DUAL TIP RULER LAB (MISCELLANEOUS) IMPLANT
NDL FILTER BLUNT 18X1 1/2 (NEEDLE) IMPLANT
NDL HYPO 25X1 1.5 SAFETY (NEEDLE) IMPLANT
NDL HYPO 27GX1-1/4 (NEEDLE) IMPLANT
NEEDLE FILTER BLUNT 18X1 1/2 (NEEDLE) IMPLANT
NEEDLE HYPO 25X1 1.5 SAFETY (NEEDLE) ×2 IMPLANT
NEEDLE HYPO 27GX1-1/4 (NEEDLE) IMPLANT
PACK BASIN DAY SURGERY FS (CUSTOM PROCEDURE TRAY) ×2 IMPLANT
PENCIL SMOKE EVACUATOR (MISCELLANEOUS) ×2 IMPLANT
PIN SAFETY STERILE (MISCELLANEOUS) IMPLANT
SHEET MEDIUM DRAPE 40X70 STRL (DRAPES) ×2 IMPLANT
SIZER BREAST 295CC (SIZER) ×2
SIZER BREAST 325CC (SIZER) ×2
SIZER BREAST 335CC (SIZER) ×2
SIZER BRST 335CC (SIZER) IMPLANT
SIZER BRST P4.5X 295CC (SIZER) IMPLANT
SIZER BRST STRL LF S 325CC (SIZER) IMPLANT
SLEEVE SCD COMPRESS KNEE MED (STOCKING) ×2 IMPLANT
SPIKE FLUID TRANSFER (MISCELLANEOUS) IMPLANT
SPONGE GAUZE 2X2 8PLY STRL LF (GAUZE/BANDAGES/DRESSINGS) IMPLANT
SPONGE T-LAP 18X18 ~~LOC~~+RFID (SPONGE) ×2 IMPLANT
STAPLER VISISTAT 35W (STAPLE) ×2 IMPLANT
STRIP CLOSURE SKIN 1/2X4 (GAUZE/BANDAGES/DRESSINGS) IMPLANT
STRIP CLOSURE SKIN 1/4X4 (GAUZE/BANDAGES/DRESSINGS) IMPLANT
SUT CHROMIC 4 0 PS 2 18 (SUTURE) IMPLANT
SUT ETHILON 2 0 FS 18 (SUTURE) IMPLANT
SUT MNCRL AB 4-0 PS2 18 (SUTURE) IMPLANT
SUT PDS AB 2-0 CT2 27 (SUTURE) IMPLANT
SUT PLAIN 5 0 P 3 18 (SUTURE) IMPLANT
SUT PROLENE 5 0 P 3 (SUTURE) IMPLANT
SUT PROLENE 5 0 PS 2 (SUTURE) IMPLANT
SUT PROLENE 6 0 P 1 18 (SUTURE) IMPLANT
SUT VIC AB 3-0 PS1 18 (SUTURE)
SUT VIC AB 3-0 PS1 18XBRD (SUTURE) IMPLANT
SUT VIC AB 3-0 SH 27 (SUTURE) ×2
SUT VIC AB 3-0 SH 27X BRD (SUTURE) IMPLANT
SUT VICRYL 4-0 PS2 18IN ABS (SUTURE) IMPLANT
SYR 20ML LL LF (SYRINGE) IMPLANT
SYR BULB EAR ULCER 3OZ GRN STR (SYRINGE) IMPLANT
SYR BULB IRRIG 60ML STRL (SYRINGE) ×4 IMPLANT
SYR CONTROL 10ML LL (SYRINGE) IMPLANT
TOWEL GREEN STERILE FF (TOWEL DISPOSABLE) ×4 IMPLANT
TRAY DSU PREP LF (CUSTOM PROCEDURE TRAY) IMPLANT
TUBE CONNECTING 20X1/4 (TUBING) ×2 IMPLANT
UNDERPAD 30X36 HEAVY ABSORB (UNDERPADS AND DIAPERS) ×4 IMPLANT
YANKAUER SUCT BULB TIP NO VENT (SUCTIONS) ×2 IMPLANT

## 2022-07-27 NOTE — Op Note (Signed)
Operative Note   DATE OF OPERATION: 11.14.23  LOCATION: [facility]-outpatient  SURGICAL DIVISION: Plastic Surgery  PREOPERATIVE DIAGNOSES:  1. History breast cancer 2. Acquired absence breasts 3. History therapeutic radiation  POSTOPERATIVE DIAGNOSES:  same  PROCEDURE:  1. Removal bilateral chest tissue expanders and placement silicone implants 2. Right nipple reduction  SURGEON: Irene Limbo MD MBA  ASSISTANT: none  ANESTHESIA:  General.   EBL: 25 ml  COMPLICATIONS: None immediate.   INDICATIONS FOR PROCEDURE:  The patient, Ebony Lamb, is a 46 y.o. female born on [birth year], is here for staged breast reconstruction following bilateral nipple sparing mastectomies and prepectoral tissue expander reconstruction.   FINDINGS: Complete incorporation ADM noted bilateral. Natrelle Smooth Round Cohesive gel Full projection implants placed bilateral RIGHT 325 ml REF SCF-325 SN [id number] LEFT 335 ml REF SCF-335 SN [id number]  DESCRIPTION OF PROCEDURE:  he patient's operative site was marked with the patient in the preoperative area. The patient was taken to the operating room. SCDs were placed and IV antibiotics were given. The patient's operative site was prepped and draped in a sterile fashion. A time out was performed and all information was confirmed to be correct. Incision made in bilateral inframammary fold mastectomy scars and carried through superficial fascia and ADM. Expanders removed. Over left chest medial superior capsulotomies performed. Additional radial scoring completed over medial inner capsule. Over right chest superior and medial capsulotomies performed. Sizer placed in each cavity. Patient assessed for symmetry. Full projection 325 ml implant selected for right chest placement 335 ml over left chest.  Each cavity irrigated with saline solution containing Ancef gentamicin and Betadine. Hemostasis ensured. Local anesthetic infiltrated in each chest. Implant placed in  left chest cavity. Implant orientation ensured. Closure completed with 3-0 vicryl to approximated capsule and superficial fascia, 4-0 vicryl in dermis, and 4-0 monocryl subcuticular skin closure. Over right chest, implant placed. Implant orientation ensured. Closure completed with 3-0 vicryl to approximated capsule and superficial fascia, 4-0 vicryl in dermis, and 4-0 monocryl subcuticular skin closure. Sharp wedge excision right nipple completed. Simple running 4-0 chromic suture completed. Dermabond applied to incisions followed by dry dressing, breast binder.   The patient was allowed to wake from anesthesia, extubated and taken to the recovery room in satisfactory condition.   SPECIMENS: none  DRAINS: none  Irene Limbo, MD [facility]  Office/ physician access line after hours [phone]

## 2022-07-27 NOTE — Anesthesia Preprocedure Evaluation (Signed)
Anesthesia Evaluation  Patient identified by MRN, date of birth, ID band Patient awake    Reviewed: Allergy & Precautions, NPO status , Patient's Chart, lab work & pertinent test results  History of Anesthesia Complications Negative for: history of anesthetic complications  Airway Mallampati: I  TM Distance: >3 FB Neck ROM: Full    Dental  (+) Teeth Intact, Dental Advisory Given   Pulmonary neg pulmonary ROS   Pulmonary exam normal        Cardiovascular hypertension, Pt. on medications Normal cardiovascular exam   Normal echo 06/22/21   Neuro/Psych    Depression    negative neurological ROS     GI/Hepatic negative GI ROS, Neg liver ROS,,,  Endo/Other  negative endocrine ROS    Renal/GU negative Renal ROS  negative genitourinary   Musculoskeletal negative musculoskeletal ROS (+)    Abdominal   Peds  Hematology negative hematology ROS (+)   Anesthesia Other Findings  Breast cancer  Reproductive/Obstetrics                             Anesthesia Physical Anesthesia Plan  ASA: 2  Anesthesia Plan: General   Post-op Pain Management: Tylenol PO (pre-op)*, Celebrex PO (pre-op)* and Gabapentin PO (pre-op)*   Induction: Intravenous  PONV Risk Score and Plan: 3 and Ondansetron, Dexamethasone, Treatment may vary due to age or medical condition and Midazolam  Airway Management Planned: LMA  Additional Equipment: None  Intra-op Plan:   Post-operative Plan: Extubation in OR  Informed Consent: I have reviewed the patients History and Physical, chart, labs and discussed the procedure including the risks, benefits and alternatives for the proposed anesthesia with the patient or authorized representative who has indicated his/her understanding and acceptance.     Dental advisory given  Plan Discussed with:   Anesthesia Plan Comments:         Anesthesia Quick Evaluation

## 2022-07-27 NOTE — Interval H&P Note (Signed)
History and Physical Interval Note:  07/27/2022 8:05 AM  Ebony Lamb  has presented today for surgery, with the diagnosis of history breast cancer, acquired absence breasts, history therapeutic radiation.  The various methods of treatment have been discussed with the patient and family. After consideration of risks, benefits and other options for treatment, the patient has consented to  Procedure(s): REMOVAL OF BILATERAL TISSUE EXPANDERS WITH PLACEMENT OF BILATERAL BREAST IMPLANTS (Bilateral) POSSIBLE RIGHT NIPPLE REDUCTION (Right) as a surgical intervention.  The patient's history has been reviewed, patient examined, no change in status, stable for surgery.  I have reviewed the patient's chart and labs.  Questions were answered to the patient's satisfaction.     Arnoldo Hooker Cheronda Erck

## 2022-07-27 NOTE — Discharge Instructions (Addendum)
  Post Anesthesia Home Care Instructions  Activity: Get plenty of rest for the remainder of the day. A responsible individual must stay with you for 24 hours following the procedure.  For the next 24 hours, DO NOT: -Drive a car -Paediatric nurse -Drink alcoholic beverages -Take any medication unless instructed by your physician -Make any legal decisions or sign important papers.  Meals: Start with liquid foods such as gelatin or soup. Progress to regular foods as tolerated. Avoid greasy, spicy, heavy foods. If nausea and/or vomiting occur, drink only clear liquids until the nausea and/or vomiting subsides. Call your physician if vomiting continues.  Special Instructions/Symptoms: Your throat may feel dry or sore from the anesthesia or the breathing tube placed in your throat during surgery. If this causes discomfort, gargle with warm salt water. The discomfort should disappear within 24 hours.  If you had a scopolamine patch placed behind your ear for the management of post- operative nausea and/or vomiting:  1. The medication in the patch is effective for 72 hours, after which it should be removed.  Wrap patch in a tissue and discard in the trash. Wash hands thoroughly with soap and water. 2. You may remove the patch earlier than 72 hours if you experience unpleasant side effects which may include dry mouth, dizziness or visual disturbances. 3. Avoid touching the patch. Wash your hands with soap and water after contact with the patch.     Tylenol can be taken after 1:45pm Ibuprofen can be taken after 3:45pm

## 2022-07-27 NOTE — Transfer of Care (Signed)
Immediate Anesthesia Transfer of Care Note  Patient: Ebony Lamb  Procedure(s) Performed: REMOVAL OF BILATERAL TISSUE EXPANDERS WITH PLACEMENT OF BILATERAL BREAST IMPLANTS (Bilateral: Chest) RIGHT NIPPLE REDUCTION (Right: Chest)  Patient Location: PACU  Anesthesia Type:General  Level of Consciousness: sedated  Airway & Oxygen Therapy: Patient Spontanous Breathing and Patient connected to face mask oxygen  Post-op Assessment: Report given to RN and Post -op Vital signs reviewed and stable  Post vital signs: Reviewed and stable  Last Vitals:  Vitals Value Taken Time  BP    Temp    Pulse 69 07/27/22 1012  Resp    SpO2 100 % 07/27/22 1012  Vitals shown include unvalidated device data.  Last Pain:  Vitals:   07/27/22 0738  TempSrc: Oral  PainSc: 0-No pain      Patients Stated Pain Goal: 3 (79/39/[id number])  Complications: No notable events documented.

## 2022-07-27 NOTE — Anesthesia Procedure Notes (Signed)
Procedure Name: LMA Insertion Date/Time: 07/27/2022 8:39 AM  Performed by: Willa Frater, CRNAPre-anesthesia Checklist: Patient identified, Emergency Drugs available, Suction available and Patient being monitored Patient Re-evaluated:Patient Re-evaluated prior to induction Oxygen Delivery Method: Circle system utilized Preoxygenation: Pre-oxygenation with 100% oxygen Induction Type: IV induction Ventilation: Mask ventilation without difficulty LMA: LMA inserted LMA Size: 3.0 Number of attempts: 1 Airway Equipment and Method: Bite block Placement Confirmation: positive ETCO2 Tube secured with: Tape Dental Injury: Teeth and Oropharynx as per pre-operative assessment

## 2022-07-27 NOTE — Anesthesia Postprocedure Evaluation (Signed)
Anesthesia Post Note  Patient: Ebony Lamb  Procedure(s) Performed: REMOVAL OF BILATERAL TISSUE EXPANDERS WITH PLACEMENT OF BILATERAL BREAST IMPLANTS (Bilateral: Chest) RIGHT NIPPLE REDUCTION (Right: Chest)     Patient location during evaluation: PACU Anesthesia Type: General Level of consciousness: awake and alert Pain management: pain level controlled Vital Signs Assessment: post-procedure vital signs reviewed and stable Respiratory status: spontaneous breathing, nonlabored ventilation and respiratory function stable Cardiovascular status: blood pressure returned to baseline and stable Postop Assessment: no apparent nausea or vomiting Anesthetic complications: no   No notable events documented.  Last Vitals:  Vitals:   07/27/22 1045 07/27/22 1101  BP: 120/80 126/85  Pulse: 69 68  Resp: 13 16  Temp:  36.6 C  SpO2: 98% 100%    Last Pain:  Vitals:   07/27/22 1055  TempSrc:   PainSc: 2                  Lynda Rainwater

## 2022-07-28 ENCOUNTER — Encounter (HOSPITAL_BASED_OUTPATIENT_CLINIC_OR_DEPARTMENT_OTHER): Payer: Self-pay | Admitting: Plastic Surgery

## 2022-07-28 LAB — SURGICAL PATHOLOGY

## 2022-08-10 ENCOUNTER — Telehealth: Payer: Self-pay | Admitting: *Deleted

## 2022-08-10 NOTE — Telephone Encounter (Signed)
Received call from pt with complaint of redness and skin irritation over right breast.  Pt states redness and irritation has been present for over 5 months and would like to have a breast exam for further evaluation and tx.  Appt scheduled, pt educated and verbalized understanding.

## 2022-08-11 ENCOUNTER — Telehealth: Payer: Self-pay

## 2022-08-11 NOTE — Telephone Encounter (Signed)
Called pt per MD to advise [employer] testing was negative/not detected. Pt verbalized understanding of results and knows [employer] will be in touch to schedule 3 mo repeat lab.   

## 2022-08-12 ENCOUNTER — Encounter: Payer: Self-pay | Admitting: Hematology and Oncology

## 2022-08-13 ENCOUNTER — Inpatient Hospital Stay: Payer: No Typology Code available for payment source | Attending: Adult Health | Admitting: Adult Health

## 2022-08-13 ENCOUNTER — Encounter: Payer: Self-pay | Admitting: Adult Health

## 2022-08-13 VITALS — BP 125/82 | HR 69 | Temp 97.3°F | Resp 18 | Ht 66.0 in | Wt 125.3 lb

## 2022-08-13 DIAGNOSIS — L539 Erythematous condition, unspecified: Secondary | ICD-10-CM | POA: Diagnosis not present

## 2022-08-13 DIAGNOSIS — Z8041 Family history of malignant neoplasm of ovary: Secondary | ICD-10-CM | POA: Insufficient documentation

## 2022-08-13 DIAGNOSIS — Z803 Family history of malignant neoplasm of breast: Secondary | ICD-10-CM | POA: Diagnosis not present

## 2022-08-13 DIAGNOSIS — C50411 Malignant neoplasm of upper-outer quadrant of right female breast: Secondary | ICD-10-CM | POA: Diagnosis present

## 2022-08-13 DIAGNOSIS — Z170 Estrogen receptor positive status [ER+]: Secondary | ICD-10-CM | POA: Insufficient documentation

## 2022-08-13 NOTE — Progress Notes (Signed)
[place] [facility] Follow up:    Ebony Contras, MD [place] [address]   DIAGNOSIS:  Cancer Staging  Malignant neoplasm of upper-outer quadrant of right breast in female, estrogen receptor positive ([place]) Staging form: Breast, AJCC 8th Edition - Clinical stage from 06/10/2021: Stage IIA (cT2, cN0, cM0, G3, ER+, PR+, HER2-) - Signed by Chauncey Cruel, MD on 06/10/2021 Stage prefix: Initial diagnosis Histologic grading system: 3 grade system Laterality: Right Staged by: Pathologist and managing physician Stage used in treatment planning: Yes National guidelines used in treatment planning: Yes Type of national guideline used in treatment planning: NCCN   SUMMARY OF ONCOLOGIC HISTORY: Oncology History  Malignant neoplasm of upper-outer quadrant of right breast in female, estrogen receptor positive ([place])  06/04/2021 Initial Diagnosis   T2 N0 IDC, grade 3, ER 15% weak to moderate staining, PR 15% weak to moderate staining (functionally triple negative with HER2 not amplified), with a Ki-67 of 70% (breast MRI 3.1 cm mass)   06/10/2021 Cancer Staging   Staging form: Breast, AJCC 8th Edition - Clinical stage from 06/10/2021: Stage IIA (cT2, cN0, cM0, G3, ER+, PR+, HER2-) - Signed by Chauncey Cruel, MD on 06/10/2021 Stage prefix: Initial diagnosis Histologic grading system: 3 grade system Laterality: Right Staged by: Pathologist and managing physician Stage used in treatment planning: Yes National guidelines used in treatment planning: Yes Type of national guideline used in treatment planning: NCCN   06/16/2021 Genetic Testing   [facility] CancerNext-Expanded was negative. No pathogenic variants were identified. 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 Report date is 06/19/2021.  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 Report date is May 2023.  The CancerNext-Expanded gene panel offered by  [facility] and includes sequencing, rearrangement, and RNA analysis for the following 77 genes: AIP, ALK, APC, ATM, AXIN2, BAP1, BARD1, BLM, BMPR1A, BRCA1, BRCA2, BRIP1, CDC73, CDH1, CDK4, CDKN1B, CDKN2A, CHEK2, CTNNA1, DICER1, FANCC, FH, FLCN, GALNT12, KIF1B, LZTR1, MAX, MEN1, MET, MLH1, MSH2, MSH3, MSH6, MUTYH, NBN, NF1, NF2, NTHL1, PALB2, PHOX2B, PMS2, POT1, PRKAR1A, PTCH1, PTEN, RAD51C, RAD51D, RB1, RECQL, RET, SDHA, SDHAF2, SDHB, SDHC, SDHD, SMAD4, SMARCA4, SMARCB1, SMARCE1, STK11, SUFU, TMEM127, TP53, TSC1, TSC2, VHL and XRCC2 (sequencing and deletion/duplication); EGFR, EGLN1, HOXB13, KIT, MITF, PDGFRA, POLD1, and POLE (sequencing only); EPCAM and GREM1 (deletion/duplication only).    06/25/2021 -  Neo-Adjuvant Chemotherapy   neoadjuvant chemotherapy consisting of doxorubicin and cyclophosphamide in dose dense fashion x4 started 0/13/2022, completed 08/10/2021, to be followed by weekly carboplatin and paclitaxel x12 starting 08/24/2021   12/07/2021 Surgery   Left mastectomy: Benign Right mastectomy: Negative for residual cancer, 0/4 lymph nodes negative pathologic complete response,   01/11/2022 -  Anti-estrogen oral therapy   Tamoxifen x 10 years   01/20/2022 - 03/01/2022 Radiation Therapy   Site Technique Total Dose (Gy) Dose per Fx (Gy) Completed Fx Beam Energies  Chest Wall, Right: CW_R 3D 50.4/50.4 1.8 28/28 6XFFF       CURRENT THERAPY:Tamoxifen  INTERVAL HISTORY: Ebony Lamb 46 y.o. female returns for f/u of breast redness.  She is taking tamoxifen and tolerated it well.  She is doing well.    She has fh of ovarian cancer and saw Dr. Polly Cobia a gyn oncologist at [facility].  She underwent lab testing that demonstrated post menopause with estradiol less than 5.  Her breast redness has been occurring on the right side and has been present after the erythema  from her radiation therapy. Her radiation ended in June, and this started end of June to early July.  The breast has a  bruised appearance as well.  She thought this may improve with the implant placement.  She has noted increased soreness and itching since her most recent implant surgery that occurred on 11/14.    She saw Dr. Iran Planas on 11/22 for post surgery f/u and she asked her about it so Ebony Lamb wanted to come and f/u about it.     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  MEDICAL HISTORY: Past Medical History:  Diagnosis Date   Abnormal Pap smear 09/13/2000   Allergy 02/2020   Seasonal   Breast cancer ([place])    Depression    history of depression- denies currently   Hypertension    Port-A-Cath in place 08/10/2021   Preterm labor     SURGICAL HISTORY: Past Surgical History:  Procedure Laterality Date   BREAST BIOPSY Right 07/27/2019   BREAST RECONSTRUCTION Right 07/27/2022   Procedure: RIGHT NIPPLE REDUCTION;  Surgeon: Irene Limbo, MD;  Location: [place];  Service: Plastics;  Laterality: Right;   BREAST RECONSTRUCTION WITH PLACEMENT OF TISSUE EXPANDER AND ALLODERM Bilateral 12/07/2021   Procedure: BREAST RECONSTRUCTION WITH PLACEMENT OF TISSUE EXPANDER AND ALLODERM;  Surgeon: Irene Limbo, MD;  Location: [place];  Service: Plastics;  Laterality: Bilateral;   CESAREAN SECTION  11/14/2011    Procedure: CESAREAN SECTION;  Surgeon: Eli Hose, MD;  Location: [facility];  Service: Gynecology;  Laterality: N/A;  Primary cesarean section with delivery of baby boy at [phone]. Apgars 9/9.   CESAREAN SECTION N/A 03/22/2014   Procedure: Repeat CESAREAN SECTION;  Surgeon: Alwyn Pea, MD;  Location: [facility];  Service: Obstetrics;  Laterality: N/A;   COSMETIC SURGERY  11/22/2017   Abdominoplasty   FOOT SURGERY     FRACTURE SURGERY  08/1999   Repair of 5th metatarsal in left foot   HERNIA REPAIR  11/22/2017   NIPPLE SPARING MASTECTOMY Left 12/07/2021   Procedure: LEFT NIPPLE SPARING MASTECTOMY;  Surgeon: Rolm Bookbinder, MD;  Location: [place];  Service: General;  Laterality: Left;   NIPPLE SPARING MASTECTOMY WITH SENTINEL LYMPH NODE BIOPSY Right 12/07/2021   Procedure: RIGHT NIPPLE SPARING MASTECTOMY WITH AXILLARY SENTINEL LYMPH NODE BIOPSY;  Surgeon: Rolm Bookbinder, MD;  Location: [place];  Service: General;  Laterality: Right;   PORTACATH PLACEMENT N/A 06/24/2021   Procedure: INSERTION PORT-A-CATH;  Surgeon: Coralie Keens, MD;  Location: [place];  Service: General;  Laterality: N/A;   REMOVAL OF BILATERAL TISSUE EXPANDERS WITH PLACEMENT OF BILATERAL BREAST IMPLANTS Bilateral 07/27/2022   Procedure: REMOVAL OF BILATERAL TISSUE EXPANDERS WITH PLACEMENT OF BILATERAL BREAST IMPLANTS;  Surgeon: Irene Limbo, MD;  Location: [place];  Service: Plastics;  Laterality: Bilateral;  TUBAL LIGATION  11/22/2017   Tubes were completely removed   WISDOM TOOTH EXTRACTION      SOCIAL HISTORY: Social History   Socioeconomic History   Marital status: Married    Spouse name: Not on file   Number of children: Not on file   Years of education: Not on file   Highest education level: Not on file  Occupational History   Not on file  Tobacco Use   Smoking status: Never   Smokeless tobacco: Never  Vaping Use    Vaping Use: Never used  Substance and Sexual Activity   Alcohol use: Not Currently    Alcohol/week: 7.0 standard drinks of alcohol    Types: 7 Standard drinks or equivalent per week    Comment: I stopped drinking alcohol after finding the lump in my brea   Drug use: No   Sexual activity: Yes    Birth control/protection: Surgical  Other Topics Concern   Not on file  Social History Narrative   Not on file   Social Determinants of Health   Financial Resource Strain: Low Risk  (06/10/2021)   Overall Financial Resource Strain (CARDIA)    Difficulty of Paying Living Expenses: Not very hard  Food Insecurity: No Food Insecurity (06/10/2021)   Hunger Vital Sign    Worried About Running Out of Food in the Last Year: Never true    Ran Out of Food in the Last Year: Never true  Transportation Needs: No Transportation Needs (06/10/2021)   PRAPARE - Hydrologist (Medical): No    Lack of Transportation (Non-Medical): No  Physical Activity: Not on file  Stress: Not on file  Social Connections: Not on file  Intimate Partner Violence: Not on file    FAMILY HISTORY: Family History  Problem Relation Age of Onset   Breast cancer Mother 58   Osteopenia Mother    Arthritis Mother    Other Mother        BRCA2 gene mutation   Ulcerative colitis Mother    Hypertension Father    Kidney disease Father    Depression Father    Hyperlipidemia Father    Melanoma Father 31   Hypertension Brother    Hyperlipidemia Brother    Breast cancer Maternal Grandmother        dx. late 30s/40s   Heart disease Maternal Grandfather    Other Maternal Grandfather        BRCA2 gene mutation   Ovarian cancer Paternal Grandmother        dx. 62s   Stroke Paternal Grandfather    Birth defects Son    Anesthesia problems Neg Hx    Diabetes Neg Hx     Review of Systems  Constitutional:  Negative for appetite change, chills, fatigue, fever and unexpected weight change.  HENT:   Negative  for hearing loss, lump/mass and trouble swallowing.   Eyes:  Negative for eye problems and icterus.  Respiratory:  Negative for chest tightness, cough and shortness of breath.   Cardiovascular:  Negative for chest pain, leg swelling and palpitations.  Gastrointestinal:  Negative for abdominal distention, abdominal pain, constipation, diarrhea, nausea and vomiting.  Endocrine: Negative for hot flashes.  Genitourinary:  Negative for difficulty urinating.   Musculoskeletal:  Negative for arthralgias.  Skin:  Negative for itching and rash.  Neurological:  Negative for dizziness, extremity weakness, headaches and numbness.  Hematological:  Negative for adenopathy. Does not bruise/bleed easily.  Psychiatric/Behavioral:  Negative for depression.  The patient is not nervous/anxious.       PHYSICAL EXAMINATION  ECOG PERFORMANCE STATUS: 1 - Symptomatic but completely ambulatory  Vitals:   08/13/22 0922  BP: 125/82  Pulse: 69  Resp: 18  Temp: (!) 97.3 F (36.3 C)  SpO2: 100%    Physical Exam Constitutional:      General: She is not in acute distress.    Appearance: Normal appearance. She is not toxic-appearing.  HENT:     Head: Normocephalic and atraumatic.  Eyes:     General: No scleral icterus. Cardiovascular:     Rate and Rhythm: Normal rate and regular rhythm.     Pulses: Normal pulses.     Heart sounds: Normal heart sounds.  Pulmonary:     Effort: Pulmonary effort is normal.     Breath sounds: Normal breath sounds.  Abdominal:     General: Abdomen is flat. Bowel sounds are normal. There is no distension.     Palpations: Abdomen is soft.     Tenderness: There is no abdominal tenderness.  Musculoskeletal:        General: No swelling.     Cervical back: Neck supple.  Lymphadenopathy:     Cervical: No cervical adenopathy.  Skin:    General: Skin is warm and dry.     Findings: No rash.  Neurological:     General: No focal deficit present.     Mental Status: She is alert.   Psychiatric:        Mood and Affect: Mood normal.        Behavior: Behavior normal.     LABORATORY DATA:  None for this visit  ASSESSMENT and THERAPY PLAN:   Malignant neoplasm of upper-outer quadrant of right breast in female, estrogen receptor positive ([place]) Ebony Lamb is a 46 year old woman with history of stage IIa estrogen positive breast cancer diagnosed in September 2022 status post neoadjuvant chemotherapy followed by bilateral mastectomies, adjuvant radiation, and antiestrogen therapy with tamoxifen.  She has undergone reconstruction with her most recent surgery being on November 14.  We discussed her right breast erythema and has this does not appear to be a breast cancer recurrence as it has been present since a few weeks after completing her radiation.  She also has recent negative Signatera test results.  I think the erythema is likely residual from her previous radiation along with recent surgeries.  I recommended that she continue to monitor it and if it is still present when she sees Dr. Aldona Bar in follow-up she can talk to her about possible skin biopsy if needed.  Ebony Lamb has follow-up in January 2024 with Dr. Lindi Adie and we will see her back at that time and she will send [name] a message if she has any concerns between now and then.    All questions were answered. The patient knows to call the clinic with any problems, questions or concerns. We can certainly see the patient much sooner if necessary.  Total encounter time:30 minutes*in face-to-face visit time, chart review, lab review, care coordination, order entry, and documentation of the encounter time.    Wilber Bihari, NP 08/13/22 3:24 PM Medical Oncology and Hematology [facility] [place], [place] [address] Tel. [phone]    Fax. [phone]  *Total Encounter Time as defined by the Centers for Medicare and Medicaid Services includes, in addition to the face-to-face time of a patient  visit (documented in the note above) non-face-to-face time: obtaining and reviewing outside history, ordering  and reviewing medications, tests or procedures, care coordination (communications with other health care professionals or caregivers) and documentation in the medical record.

## 2022-08-13 NOTE — Assessment & Plan Note (Signed)
Ebony Lamb is a 46 year old woman with history of stage IIa estrogen positive breast cancer diagnosed in September 2022 status post neoadjuvant chemotherapy followed by bilateral mastectomies, adjuvant radiation, and antiestrogen therapy with tamoxifen.  She has undergone reconstruction with her most recent surgery being on November 14.  We discussed her right breast erythema and has this does not appear to be a breast cancer recurrence as it has been present since a few weeks after completing her radiation.  She also has recent negative Signatera test results.  I think the erythema is likely residual from her previous radiation along with recent surgeries.  I recommended that she continue to monitor it and if it is still present when she sees Dr. Aldona Bar in follow-up she can talk to her about possible skin biopsy if needed.  Ebony Lamb has follow-up in January 2024 with Dr. Lindi Adie and we will see her back at that time and she will send [name] a message if she has any concerns between now and then.

## 2022-08-30 ENCOUNTER — Ambulatory Visit: Payer: No Typology Code available for payment source | Attending: Surgery

## 2022-08-30 VITALS — Wt 127.5 lb

## 2022-08-30 DIAGNOSIS — Z483 Aftercare following surgery for neoplasm: Secondary | ICD-10-CM | POA: Insufficient documentation

## 2022-08-30 NOTE — Therapy (Signed)
OUTPATIENT PHYSICAL THERAPY SOZO SCREENING NOTE   Patient Name: Ebony Lamb MRN: [id number] DOB:Jun 17, 1976, 46 y.o., female Today's Date: 08/30/2022  PCP: Antony Contras, MD REFERRING PROVIDER: Coralie Keens, MD   PT End of Session - 08/30/22 1636     Visit Number 13   # unchanged due to screen only   PT Start Time 0037    PT Stop Time 1640    PT Time Calculation (min) 6 min    Activity Tolerance Patient tolerated treatment well    Behavior During Therapy [facility] for tasks assessed/performed             Past Medical History:  Diagnosis Date   Abnormal Pap smear 09/13/2000   Allergy 02/2020   Seasonal   Breast cancer ([place])    Depression    history of depression- denies currently   Hypertension    Port-A-Cath in place 08/10/2021   Preterm labor    Past Surgical History:  Procedure Laterality Date   BREAST BIOPSY Right 07/27/2019   BREAST RECONSTRUCTION Right 07/27/2022   Procedure: RIGHT NIPPLE REDUCTION;  Surgeon: Irene Limbo, MD;  Location: [place];  Service: Plastics;  Laterality: Right;   BREAST RECONSTRUCTION WITH PLACEMENT OF TISSUE EXPANDER AND ALLODERM Bilateral 12/07/2021   Procedure: BREAST RECONSTRUCTION WITH PLACEMENT OF TISSUE EXPANDER AND ALLODERM;  Surgeon: Irene Limbo, MD;  Location: [place];  Service: Plastics;  Laterality: Bilateral;   CESAREAN SECTION  11/14/2011   Procedure: CESAREAN SECTION;  Surgeon: Eli Hose, MD;  Location: [place] [facility];  Service: Gynecology;  Laterality: N/A;  Primary cesarean section with delivery of baby boy at [phone]. Apgars 9/9.   CESAREAN SECTION N/A 03/22/2014   Procedure: Repeat CESAREAN SECTION;  Surgeon: Alwyn Pea, MD;  Location: [place] [facility];  Service: Obstetrics;  Laterality: N/A;   COSMETIC SURGERY  11/22/2017   Abdominoplasty   FOOT SURGERY     FRACTURE SURGERY  08/1999   Repair of 5th metatarsal in left foot   HERNIA REPAIR  11/22/2017   NIPPLE SPARING MASTECTOMY  Left 12/07/2021   Procedure: LEFT NIPPLE SPARING MASTECTOMY;  Surgeon: Rolm Bookbinder, MD;  Location: [place];  Service: General;  Laterality: Left;   NIPPLE SPARING MASTECTOMY WITH SENTINEL LYMPH NODE BIOPSY Right 12/07/2021   Procedure: RIGHT NIPPLE SPARING MASTECTOMY WITH AXILLARY SENTINEL LYMPH NODE BIOPSY;  Surgeon: Rolm Bookbinder, MD;  Location: [place];  Service: General;  Laterality: Right;   PORTACATH PLACEMENT N/A 06/24/2021   Procedure: INSERTION PORT-A-CATH;  Surgeon: Coralie Keens, MD;  Location: [place];  Service: General;  Laterality: N/A;   REMOVAL OF BILATERAL TISSUE EXPANDERS WITH PLACEMENT OF BILATERAL BREAST IMPLANTS Bilateral 07/27/2022   Procedure: REMOVAL OF BILATERAL TISSUE EXPANDERS WITH PLACEMENT OF BILATERAL BREAST IMPLANTS;  Surgeon: Irene Limbo, MD;  Location: [place];  Service: Plastics;  Laterality: Bilateral;   TUBAL LIGATION  11/22/2017   Tubes were completely removed   WISDOM TOOTH EXTRACTION     Patient Active Problem List   Diagnosis Date Noted   Genetic testing 06/17/2021   Family history of breast cancer 06/10/2021   Family history of ovarian cancer 06/10/2021   Family history of BRCA gene mutation 06/10/2021   Malignant neoplasm of upper-outer quadrant of right breast in female, estrogen receptor positive ([place]) 06/08/2021   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 She underwent neoadjuvant chemotherapy and then had bilateral mastectomies with a right sentinel node biopsy (4 negative nodes) on 12/07/2021. 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   PRECAUTIONS: right UE Lymphedema risk, None  SUBJECTIVE: Pt returns for her 3 month L-Dex screen . "My chest has been feeling tight since my implant exchange in Nov."   PAIN:  Are you having pain? No  SOZO SCREENING: Patient was assessed today using the SOZO machine to determine the lymphedema index score. This was compared to her baseline score. It was determined that she is within the recommended range when compared to her baseline and no further action is needed at this time. She will continue SOZO screenings. These are done every 3 months for 2 years post operatively followed by every 6 months for 2 years, and then annually.  Educated pt to resume HEP that she has been nervous about since implant exchange. She also knows she can reach out to her doctor for physical therapy if her tightness doesn't resolve with HEP stretching.   L-DEX FLOWSHEETS - 08/30/22 1600       L-DEX LYMPHEDEMA SCREENING   Measurement Type Unilateral    L-DEX MEASUREMENT EXTREMITY Upper Extremity    POSITION  Standing    DOMINANT SIDE Right    At Risk Side Right    BASELINE SCORE (UNILATERAL) -0.7    L-DEX SCORE (UNILATERAL) -1.6    VALUE CHANGE (UNILAT) -0.9              [name], PTA 08/30/2022, 4:42 PM

## 2022-09-15 NOTE — Progress Notes (Signed)
Patient Care Team: Antony Contras, MD as PCP - General (Family Medicine) Eppie Gibson, MD as Attending Physician (Radiation Oncology) Sanjuana Kava, MD (Inactive) as Referring Physician (Obstetrics and Gynecology) Nicholas Lose, MD as Consulting Physician (Hematology and Oncology) Rolm Bookbinder, MD as Consulting Physician (General Surgery) Irene Limbo, MD as Consulting Physician (Plastic Surgery)  DIAGNOSIS:  Encounter Diagnosis  Name Primary?   Malignant neoplasm of upper-outer quadrant of right breast in female, estrogen receptor positive ([place]) Yes    SUMMARY OF ONCOLOGIC HISTORY: Oncology History  Malignant neoplasm of upper-outer quadrant of right breast in female, estrogen receptor positive ([place])  06/04/2021 Initial Diagnosis   T2 N0 IDC, grade 3, ER 15% weak to moderate staining, PR 15% weak to moderate staining (functionally triple negative with HER2 not amplified), with a Ki-67 of 70% (breast MRI 3.1 cm mass)   06/10/2021 Cancer Staging   Staging form: Breast, AJCC 8th Edition - Clinical stage from 06/10/2021: Stage IIA (cT2, cN0, cM0, G3, ER+, PR+, HER2-) - Signed by Chauncey Cruel, MD on 06/10/2021 Stage prefix: Initial diagnosis Histologic grading system: 3 grade system Laterality: Right Staged by: Pathologist and managing physician Stage used in treatment planning: Yes National guidelines used in treatment planning: Yes Type of national guideline used in treatment planning: NCCN   06/16/2021 Genetic Testing   Ambry CancerNext-Expanded was negative. No pathogenic variants were identified. 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 Report date is 06/19/2021.  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 Report date is May 2023.  The CancerNext-Expanded gene panel offered by [facility] and includes sequencing, rearrangement, and RNA analysis for the following 77 genes: AIP, ALK, APC, ATM, AXIN2, BAP1, BARD1,  BLM, BMPR1A, BRCA1, BRCA2, BRIP1, CDC73, CDH1, CDK4, CDKN1B, CDKN2A, CHEK2, CTNNA1, DICER1, FANCC, FH, FLCN, GALNT12, KIF1B, LZTR1, MAX, MEN1, MET, MLH1, MSH2, MSH3, MSH6, MUTYH, NBN, NF1, NF2, NTHL1, PALB2, PHOX2B, PMS2, POT1, PRKAR1A, PTCH1, PTEN, RAD51C, RAD51D, RB1, RECQL, RET, SDHA, SDHAF2, SDHB, SDHC, SDHD, SMAD4, SMARCA4, SMARCB1, SMARCE1, STK11, SUFU, TMEM127, TP53, TSC1, TSC2, VHL and XRCC2 (sequencing and deletion/duplication); EGFR, EGLN1, HOXB13, KIT, MITF, PDGFRA, POLD1, and POLE (sequencing only); EPCAM and GREM1 (deletion/duplication only).    06/25/2021 -  Neo-Adjuvant Chemotherapy   neoadjuvant chemotherapy consisting of doxorubicin and cyclophosphamide in dose dense fashion x4 started 0/13/2022, completed 08/10/2021, to be followed by weekly carboplatin and paclitaxel x12 starting 08/24/2021   12/07/2021 Surgery   Left mastectomy: Benign Right mastectomy: Negative for residual cancer, 0/4 lymph nodes negative pathologic complete response,   01/11/2022 -  Anti-estrogen oral therapy   Tamoxifen x 10 years   01/20/2022 - 03/01/2022 Radiation Therapy   Site Technique Total Dose (Gy) Dose per Fx (Gy) Completed Fx Beam Energies  Chest Wall, Right: CW_R 3D 50.4/50.4 1.8 28/28 6XFFF       CHIEF COMPLIANT: Follow up on Tamoxifen  INTERVAL HISTORY: Ebony Lamb is a 47 y.o with the above mention follow-up to on tamoxifen. She presents to the clinic for a follow-up.     ALLERGIES:  is allergic to cefepime.  MEDICATIONS:  Current Outpatient Medications  Medication Sig Dispense Refill   loratadine (CLARITIN) 10 MG tablet Take 10 mg by mouth as needed for allergies.     tamoxifen (NOLVADEX) 20 MG tablet Take 1 tablet (20 mg total) by mouth daily. 90 tablet 3   Vitamin D, Ergocalciferol, (DRISDOL) 1.25 MG (50000 UNIT) CAPS capsule Take 50,000 Units by mouth every 7 (seven) days.  No current facility-administered medications for this visit.    PHYSICAL EXAMINATION: ECOG  PERFORMANCE STATUS: 1 - Symptomatic but completely ambulatory  Vitals:   09/23/22 0942  BP: (!) 117/98  Pulse: 77  Temp: 98.1 F (36.7 C)  SpO2: 100%   Filed Weights   09/23/22 0942  Weight: 129 lb (58.5 kg)    BREAST: No palpable masses or nodules in either right or left breasts. No palpable axillary supraclavicular or infraclavicular adenopathy no breast tenderness or nipple discharge. (exam performed in the presence of a chaperone)  LABORATORY DATA:  I have reviewed the data as listed    Latest Ref Rng & Units 11/13/2021    8:15 AM 11/06/2021    9:10 AM 10/30/2021    9:37 AM  CMP  Glucose 70 - 99 mg/dL 105  104  102   BUN 6 - 20 mg/dL _0 Creatinine 0.44 - 1.00 mg/dL 0.59  0.58  0.57   Sodium 135 - 145 mmol/L 140  140  139   Potassium 3.5 - 5.1 mmol/L 3.9  3.6  3.8   Chloride 98 - 111 mmol/L 108  108  107   CO2 22 - 32 mmol/L _1 Calcium 8.9 - 10.3 mg/dL 9.5  9.4  9.2   Total Protein 6.5 - 8.1 g/dL 6.6  6.5  6.7   Total Bilirubin 0.3 - 1.2 mg/dL 0.4  0.4  0.4   Alkaline Phos 38 - 126 U/L 81  71  65   AST 15 - 41 U/L _2 ALT 0 - 44 U/L 32  31  27     Lab Results  Component Value Date   WBC 7.8 11/13/2021   HGB 11.5 (L) 11/13/2021   HCT 33.0 (L) 11/13/2021   MCV 97.9 11/13/2021   PLT 170 11/13/2021   NEUTROABS 6.3 11/13/2021    ASSESSMENT & PLAN:  Malignant neoplasm of upper-outer quadrant of right breast in female, estrogen receptor positive ([facility]) 06/04/2021:T2 N0 IDC, grade 3, ER 15% weak to moderate staining, PR 15% weak to moderate staining (functionally triple negative with HER2 not amplified), with a Ki-67 of 70% (breast MRI 3.1 cm mass)   Treatment Plan: 1. Neo adj chemo with AC X 4 followed by Taxol and Carboplatin weekly X 12 2. bilateral mastectomies with reconstruction 12/07/2021: Pathologic complete response in the right breast 0/4 lymph nodes negative 3.  Adjuvant radiation completed 03/01/2022 4. Adj Anti estrogen therapy  with tamoxifen for 10 yrs (started 10 mg daily on 01/11/2022: If she undergoes hysterectomy and BSO we can switch her to letrozole) ------------------------------------------------------------------------------------------------------------------- Tamoxifen toxicities: Tolerating extremely well without any problems or concerns.  Recent lab work showed that she was in menopause.  She is discussing whether or not to switch to anastrozole therapy. We will obtain a bone density test for further evaluation.  Lung nodules: Will also obtain CT of the chest for further evaluation.  Breast cancer surveillance: Breast exam: Benign No role of imaging since she had bilateral mastectomies.  Telephone visit after the CT chest to discuss results.    No orders of the defined types were placed in this encounter.  The patient has a good understanding of the overall plan. she agrees with it. she will call with any problems that may develop before the next visit here. Total time spent: 30 mins including face to face time and time spent for planning,  charting and co-ordination of care   Harriette Ohara, MD 09/23/22    I Gardiner Coins am acting as a Education administrator for [employer]  I have reviewed the above documentation for accuracy and completeness, and I agree with the above.

## 2022-09-23 ENCOUNTER — Inpatient Hospital Stay: Payer: No Typology Code available for payment source | Attending: Adult Health | Admitting: Hematology and Oncology

## 2022-09-23 VITALS — BP 117/98 | HR 77 | Temp 98.1°F | Wt 129.0 lb

## 2022-09-23 DIAGNOSIS — C50411 Malignant neoplasm of upper-outer quadrant of right female breast: Secondary | ICD-10-CM | POA: Diagnosis not present

## 2022-09-23 DIAGNOSIS — Z780 Asymptomatic menopausal state: Secondary | ICD-10-CM | POA: Diagnosis not present

## 2022-09-23 DIAGNOSIS — Z170 Estrogen receptor positive status [ER+]: Secondary | ICD-10-CM | POA: Insufficient documentation

## 2022-09-23 DIAGNOSIS — R918 Other nonspecific abnormal finding of lung field: Secondary | ICD-10-CM | POA: Diagnosis not present

## 2022-09-23 DIAGNOSIS — Z9013 Acquired absence of bilateral breasts and nipples: Secondary | ICD-10-CM | POA: Diagnosis not present

## 2022-09-23 DIAGNOSIS — Z79810 Long term (current) use of selective estrogen receptor modulators (SERMs): Secondary | ICD-10-CM | POA: Insufficient documentation

## 2022-09-23 NOTE — Assessment & Plan Note (Signed)
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 Neo adj chemo with AC X 4 followed by Taxol and Carboplatin weekly X 12 2. bilateral mastectomies with reconstruction 12/07/2021: Pathologic complete response in the right breast 0/4 lymph nodes negative 3.  Adjuvant radiation completed 03/01/2022 4. Adj Anti estrogen therapy with tamoxifen for 10 yrs (started 10 mg daily on 01/11/2022: If she undergoes hysterectomy and BSO we can switch her to letrozole) ------------------------------------------------------------------------------------------------------------------- Tamoxifen toxicities:  Breast cancer surveillance: Breast exam: Benign No role of imaging since she had bilateral mastectomies.  Return to clinic in 1 year for follow-up

## 2022-09-28 ENCOUNTER — Telehealth: Payer: Self-pay | Admitting: Hematology and Oncology

## 2022-09-28 NOTE — Telephone Encounter (Signed)
Scheduled appointmetn per 1/11 los. Unable to leave a voicemail due to mailbox being full.

## 2022-09-30 ENCOUNTER — Ambulatory Visit (HOSPITAL_COMMUNITY)
Admission: RE | Admit: 2022-09-30 | Discharge: 2022-09-30 | Disposition: A | Discharge status: Home / Self Care | Payer: No Typology Code available for payment source | Source: Ambulatory Visit | Attending: Hematology and Oncology | Admitting: Hematology and Oncology

## 2022-09-30 DIAGNOSIS — R918 Other nonspecific abnormal finding of lung field: Secondary | ICD-10-CM | POA: Diagnosis present

## 2022-09-30 DIAGNOSIS — Z170 Estrogen receptor positive status [ER+]: Secondary | ICD-10-CM | POA: Insufficient documentation

## 2022-09-30 DIAGNOSIS — C50411 Malignant neoplasm of upper-outer quadrant of right female breast: Secondary | ICD-10-CM | POA: Diagnosis not present

## 2022-09-30 MED ORDER — IOHEXOL 300 MG/ML  SOLN
75.0000 mL | Freq: Once | INTRAMUSCULAR | Status: AC | PRN
  Administered 2022-09-30: 75 mL via INTRAVENOUS

## 2022-09-30 MED ORDER — SODIUM CHLORIDE (PF) 0.9 % IJ SOLN
INTRAMUSCULAR | Status: AC
  Filled 2022-09-30: qty 50

## 2022-10-05 ENCOUNTER — Inpatient Hospital Stay: Payer: No Typology Code available for payment source | Admitting: Hematology and Oncology

## 2022-10-05 ENCOUNTER — Other Ambulatory Visit: Payer: Self-pay | Admitting: *Deleted

## 2022-10-05 ENCOUNTER — Ambulatory Visit (HOSPITAL_BASED_OUTPATIENT_CLINIC_OR_DEPARTMENT_OTHER)
Admission: RE | Admit: 2022-10-05 | Discharge: 2022-10-05 | Disposition: A | Discharge status: Home / Self Care | Payer: No Typology Code available for payment source | Source: Ambulatory Visit | Attending: Hematology and Oncology | Admitting: Hematology and Oncology

## 2022-10-05 DIAGNOSIS — Z780 Asymptomatic menopausal state: Secondary | ICD-10-CM | POA: Diagnosis not present

## 2022-10-05 DIAGNOSIS — C50411 Malignant neoplasm of upper-outer quadrant of right female breast: Secondary | ICD-10-CM | POA: Diagnosis not present

## 2022-10-05 DIAGNOSIS — Z170 Estrogen receptor positive status [ER+]: Secondary | ICD-10-CM | POA: Diagnosis not present

## 2022-10-05 MED ORDER — ANASTROZOLE 1 MG PO TABS: 1.0000 mg | ORAL_TABLET | Freq: Every day | ORAL | 3 refills | Status: DC

## 2022-10-05 NOTE — Progress Notes (Signed)
HEMATOLOGY-ONCOLOGY TELEPHONE VISIT PROGRESS NOTE  I connected with our patient on 10/05/22 at  9:15 AM EST by telephone and verified that I am speaking with the correct person using two identifiers.  I discussed the limitations, risks, security and privacy concerns of performing an evaluation and management service by telephone and the availability of in person appointments.  I also discussed with the patient that there may be a patient responsible charge related to this service. The patient expressed understanding and agreed to proceed.   History of Present Illness: Ebony Lamb is a 47 y.o with the above mention follow-up to on tamoxifen. She presents to the clinic for a telephone follow-up to review scans.  Oncology History  Malignant neoplasm of upper-outer quadrant of right breast in female, estrogen receptor positive ([place])  06/04/2021 Initial Diagnosis   T2 N0 IDC, grade 3, ER 15% weak to moderate staining, PR 15% weak to moderate staining (functionally triple negative with HER2 not amplified), with a Ki-67 of 70% (breast MRI 3.1 cm mass)   06/10/2021 Cancer Staging   Staging form: Breast, AJCC 8th Edition - Clinical stage from 06/10/2021: Stage IIA (cT2, cN0, cM0, G3, ER+, PR+, HER2-) - Signed by Chauncey Cruel, MD on 06/10/2021 Stage prefix: Initial diagnosis Histologic grading system: 3 grade system Laterality: Right Staged by: Pathologist and managing physician Stage used in treatment planning: Yes National guidelines used in treatment planning: Yes Type of national guideline used in treatment planning: NCCN   06/16/2021 Genetic Testing   Ambry CancerNext-Expanded was negative. No pathogenic variants were identified. 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 Report date is 06/19/2021.  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 Report date is May 2023.  The CancerNext-Expanded gene panel offered by [facility] and includes  sequencing, rearrangement, and RNA analysis for the following 77 genes: AIP, ALK, APC, ATM, AXIN2, BAP1, BARD1, BLM, BMPR1A, BRCA1, BRCA2, BRIP1, CDC73, CDH1, CDK4, CDKN1B, CDKN2A, CHEK2, CTNNA1, DICER1, FANCC, FH, FLCN, GALNT12, KIF1B, LZTR1, MAX, MEN1, MET, MLH1, MSH2, MSH3, MSH6, MUTYH, NBN, NF1, NF2, NTHL1, PALB2, PHOX2B, PMS2, POT1, PRKAR1A, PTCH1, PTEN, RAD51C, RAD51D, RB1, RECQL, RET, SDHA, SDHAF2, SDHB, SDHC, SDHD, SMAD4, SMARCA4, SMARCB1, SMARCE1, STK11, SUFU, TMEM127, TP53, TSC1, TSC2, VHL and XRCC2 (sequencing and deletion/duplication); EGFR, EGLN1, HOXB13, KIT, MITF, PDGFRA, POLD1, and POLE (sequencing only); EPCAM and GREM1 (deletion/duplication only).    06/25/2021 -  Neo-Adjuvant Chemotherapy   neoadjuvant chemotherapy consisting of doxorubicin and cyclophosphamide in dose dense fashion x4 started 0/13/2022, completed 08/10/2021, to be followed by weekly carboplatin and paclitaxel x12 starting 08/24/2021   12/07/2021 Surgery   Left mastectomy: Benign Right mastectomy: Negative for residual cancer, 0/4 lymph nodes negative pathologic complete response,   01/11/2022 -  Anti-estrogen oral therapy   Tamoxifen x 10 years   01/20/2022 - 03/01/2022 Radiation Therapy   Site Technique Total Dose (Gy) Dose per Fx (Gy) Completed Fx Beam Energies  Chest Wall, Right: CW_R 3D 50.4/50.4 1.8 28/28 6XFFF       REVIEW OF SYSTEMS:   Constitutional: Denies fevers, chills or abnormal weight loss All other systems were reviewed with the patient and are negative. Observations/Objective:     Assessment Plan:  Malignant neoplasm of upper-outer quadrant of right breast in female, estrogen receptor positive ([place]) 06/04/2021:T2 N0 IDC, grade 3, ER 15% weak to moderate staining, PR 15% weak to moderate staining (functionally triple negative with HER2 not amplified), with a Ki-67 of 70% (breast MRI 3.1 cm mass)  Treatment Plan: 1. Neo adj chemo with AC X 4 followed by Taxol and Carboplatin weekly X 12 2.  bilateral mastectomies with reconstruction 12/07/2021: Pathologic complete response in the right breast 0/4 lymph nodes negative 3.  Adjuvant radiation completed 03/01/2022 4. Adj Anti estrogen therapy with tamoxifen for 10 yrs (started 10 mg daily on 01/11/2022:  Switched to Anastrozole 10/05/22: Patient was in menopause by lab work ------------------------------------------------------------------------------------------------------------------- Anastrozole counseling: We discussed the risks and benefits of anti-estrogen therapy with aromatase inhibitors. These include but not limited to insomnia, hot flashes, mood changes, vaginal dryness, bone density loss, and weight gain. We strongly believe that the benefits far outweigh the risks. Patient understands these risks and consented to starting treatment. Planned treatment duration is 7 years.   Lung nodules: CT chest 09/30/2022: No evidence of metastatic disease in the chest.  Previously described right middle lobe nodules are stable and considered benign.  Postradiation changes right lung   Breast cancer surveillance: Breast exam: Benign No role of imaging since she had bilateral mastectomies. She has a bone density coming up later today. Telephone visit in 3 months to assess tolerance to anastrozole therapy.  If she does not tolerate it we can go back to tamoxifen.   I discussed the assessment and treatment plan with the patient. The patient was provided an opportunity to ask questions and all were answered. The patient agreed with the plan and demonstrated an understanding of the instructions. The patient was advised to call back or seek an in-person evaluation if the symptoms worsen or if the condition fails to improve as anticipated.   I provided 12 minutes of non-face-to-face time during this encounter.  This includes time for charting and coordination of care   Harriette Ohara, MD  I Gardiner Coins am acting as a scribe for Dr.Polly Barner  I have reviewed the above documentation for accuracy and completeness, and I agree with the above.

## 2022-10-05 NOTE — Assessment & Plan Note (Signed)
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 Neo adj chemo with AC X 4 followed by Taxol and Carboplatin weekly X 12 2. bilateral mastectomies with reconstruction 12/07/2021: Pathologic complete response in the right breast 0/4 lymph nodes negative 3.  Adjuvant radiation completed 03/01/2022 4. Adj Anti estrogen therapy with tamoxifen for 10 yrs (started 10 mg daily on 01/11/2022: If she undergoes hysterectomy and BSO we can switch her to letrozole) ------------------------------------------------------------------------------------------------------------------- Tamoxifen toxicities: Tolerating extremely well without any problems or concerns.   Recent lab work showed that she was in menopause.  She is discussing whether or not to switch to anastrozole therapy. We will obtain a bone density test for further evaluation.   Lung nodules: CT chest 09/30/2022: No evidence of metastatic disease in the chest.  Previously described right middle lobe nodules are stable and considered benign.  Postradiation changes right lung   Breast cancer surveillance: Breast exam: Benign No role of imaging since she had bilateral mastectomies.   Return to clinic in 1 year for follow-up

## 2022-10-05 NOTE — Progress Notes (Signed)
Per MD request, Signatera renewal order placed.

## 2022-10-06 ENCOUNTER — Encounter: Payer: Self-pay | Admitting: Hematology and Oncology

## 2022-10-06 ENCOUNTER — Telehealth: Payer: Self-pay | Admitting: Hematology and Oncology

## 2022-10-06 NOTE — Telephone Encounter (Signed)
Scheduled appointment per 1/23 los. Patient is aware.

## 2022-10-06 NOTE — Telephone Encounter (Signed)
I reviewed the density test result with the patient by phone.  Her T-score was -2.9 which is quite severe osteoporosis. Therefore I recommended that she remain on tamoxifen at this time. She will discuss with Mendel Ryder about bisphosphonate therapy when she meets with her. She might benefit from Zometa every 6 months x 4 doses both of the breast cancer prevention measures as well as to improve her bone density and after that she could go on any other bisphosphonate therapy.

## 2022-10-08 ENCOUNTER — Telehealth: Payer: Self-pay

## 2022-10-08 NOTE — Telephone Encounter (Signed)
Called Pt regarding telephone appt 1/29. NP stated she will not be able to call Pt until 0820. Pt verbalized understanding is agreeable to 0820 phone call.

## 2022-10-11 ENCOUNTER — Inpatient Hospital Stay (HOSPITAL_BASED_OUTPATIENT_CLINIC_OR_DEPARTMENT_OTHER): Payer: No Typology Code available for payment source | Admitting: Adult Health

## 2022-10-11 DIAGNOSIS — M810 Age-related osteoporosis without current pathological fracture: Secondary | ICD-10-CM | POA: Insufficient documentation

## 2022-10-11 DIAGNOSIS — M816 Localized osteoporosis [Lequesne]: Secondary | ICD-10-CM

## 2022-10-11 DIAGNOSIS — C50411 Malignant neoplasm of upper-outer quadrant of right female breast: Secondary | ICD-10-CM

## 2022-10-11 DIAGNOSIS — E559 Vitamin D deficiency, unspecified: Secondary | ICD-10-CM

## 2022-10-11 DIAGNOSIS — Z170 Estrogen receptor positive status [ER+]: Secondary | ICD-10-CM | POA: Diagnosis not present

## 2022-10-11 NOTE — Assessment & Plan Note (Signed)
Ebony Lamb is a 47 year old woman with history of stage IIa ER/PR positive breast cancer diagnosed in September 2022 status post neoadjuvant chemotherapy, bilateral mastectomies, adjuvant radiation, and antiestrogen with tamoxifen which began in May 2023.  She has opted to continue on tamoxifen based on her bone density results which demonstrated osteoporosis.  She did not start the anastrozole and does not plan to do so.  I have canceled her April 2024 appointment with Dr. Lindi Adie instead she will come in person for labs and follow-up in July 2024.

## 2022-10-11 NOTE — Assessment & Plan Note (Signed)
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  We discussed bone density in detail today.  The things that will help her bone density include tamoxifen daily, calcium 1200 mg a day, and vitamin D3 2000 IU daily.  She has a history of vitamin D deficiency however she has incorporated a weekly prescription vitamin D supplement into her regimen and has completed this successfully.  At this point she should be good to change to daily supplementation.  We also discussed a magnesium supplement of 400 mg daily as well as weightbearing exercises.  I reviewed with Ebony Lamb that we will repeat bone density testing in January 2026.  We will monitor her calcium and vitamin D levels to ensure optimal supplementation to maintain her bone density.  She will return in 6 months for labs and a in person visit with Dr. Lindi Adie.

## 2022-10-11 NOTE — Progress Notes (Signed)
[facility] Follow up:    Ebony Contras, MD [address] [place] [address]   DIAGNOSIS:  Cancer Staging  Malignant neoplasm of upper-outer quadrant of right breast in female, estrogen receptor positive ([place]) Staging form: Breast, AJCC 8th Edition - Clinical stage from 06/10/2021: Stage IIA (cT2, cN0, cM0, G3, ER+, PR+, HER2-) - Signed by Chauncey Cruel, MD on 06/10/2021 Stage prefix: Initial diagnosis Histologic grading system: 3 grade system Laterality: Right Staged by: Pathologist and managing physician Stage used in treatment planning: Yes National guidelines used in treatment planning: Yes Type of national guideline used in treatment planning: NCCN  I connected with Ebony Lamb on 10/11/22 at  8:00 AM EST by telephone and verified that I am speaking with the correct person using two identifiers.  I discussed the limitations, risks, security and privacy concerns of performing an evaluation and management service by telephone and the availability of in person appointments.  I also discussed with the patient that there may be a patient responsible charge related to this service. The patient expressed understanding and agreed to proceed.  Patient location: Provider location: [facility] office  SUMMARY OF ONCOLOGIC HISTORY: Oncology History  Malignant neoplasm of upper-outer quadrant of right breast in female, estrogen receptor positive ([place])  06/04/2021 Initial Diagnosis   T2 N0 IDC, grade 3, ER 15% weak to moderate staining, PR 15% weak to moderate staining (functionally triple negative with HER2 not amplified), with a Ki-67 of 70% (breast MRI 3.1 cm mass)   06/10/2021 Cancer Staging   Staging form: Breast, AJCC 8th Edition - Clinical stage from 06/10/2021: Stage IIA (cT2, cN0, cM0, G3, ER+, PR+, HER2-) - Signed by Chauncey Cruel, MD on 06/10/2021 Stage prefix: Initial diagnosis Histologic grading system: 3 grade system Laterality: Right Staged  by: Pathologist and managing physician Stage used in treatment planning: Yes National guidelines used in treatment planning: Yes Type of national guideline used in treatment planning: NCCN   06/16/2021 Genetic Testing   [facility] CancerNext-Expanded was negative. No pathogenic variants were identified. 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 Report date is 06/19/2021.  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 Report date is May 2023.  The CancerNext-Expanded gene panel offered by [facility] and includes sequencing, rearrangement, and RNA analysis for the following 77 genes: AIP, ALK, APC, ATM, AXIN2, BAP1, BARD1, BLM, BMPR1A, BRCA1, BRCA2, BRIP1, CDC73, CDH1, CDK4, CDKN1B, CDKN2A, CHEK2, CTNNA1, DICER1, FANCC, FH, FLCN, GALNT12, KIF1B, LZTR1, MAX, MEN1, MET, MLH1, MSH2, MSH3, MSH6, MUTYH, NBN, NF1, NF2, NTHL1, PALB2, PHOX2B, PMS2, POT1, PRKAR1A, PTCH1, PTEN, RAD51C, RAD51D, RB1, RECQL, RET, SDHA, SDHAF2, SDHB, SDHC, SDHD, SMAD4, SMARCA4, SMARCB1, SMARCE1, STK11, SUFU, TMEM127, TP53, TSC1, TSC2, VHL and XRCC2 (sequencing and deletion/duplication); EGFR, EGLN1, HOXB13, KIT, MITF, PDGFRA, POLD1, and POLE (sequencing only); EPCAM and GREM1 (deletion/duplication only).    06/25/2021 -  Neo-Adjuvant Chemotherapy   neoadjuvant chemotherapy consisting of doxorubicin and cyclophosphamide in dose dense fashion x4 started 0/13/2022, completed 08/10/2021, to be followed by weekly carboplatin and paclitaxel x12 starting 08/24/2021   12/07/2021 Surgery   Left mastectomy: Benign Right mastectomy: Negative for residual cancer, 0/4 lymph nodes negative pathologic complete response,   01/11/2022 -  Anti-estrogen oral therapy   Tamoxifen x 10 years   01/20/2022 - 03/01/2022 Radiation Therapy   Site Technique Total Dose (Gy) Dose per Fx (Gy) Completed Fx Beam Energies  Chest Wall, Right: CW_R 3D 50.4/50.4 1.8 28/28 6XFFF  CURRENT THERAPY:  Tamoxifen  INTERVAL HISTORY: Ebony Lamb 47 y.o. female returns for f/u to discuss her recent bone density testing results.  These were completed on October 05, 2022 and demonstrated osteoporosis with a T-score of -2.9 in the AP spine along with osteopenia with a T-score of -1.9 in the right femur and 1.8 in the total femur.  The day of her bone density testing she was changed to Anastrozole.  She received her results and was instructed to stay on Tamoxifen daily.  She is tolerating Tamoxifen daily and has mild hot flashes that were present prior to starting the Tamoxifen.    She is running two to three times a week, walks her children to school and takes barre classes.     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  MEDICAL HISTORY: Past Medical History:  Diagnosis Date   Abnormal Pap smear 09/13/2000   Allergy 02/2020   Seasonal   Breast cancer ([place])    Depression    history of depression- denies currently   Hypertension    Port-A-Cath in place 08/10/2021   Preterm labor     SURGICAL HISTORY: Past Surgical History:  Procedure Laterality Date   BREAST BIOPSY Right 07/27/2019   BREAST RECONSTRUCTION Right 07/27/2022   Procedure: RIGHT NIPPLE REDUCTION;  Surgeon: Irene Limbo, MD;   Location: [facility];  Service: Plastics;  Laterality: Right;   BREAST RECONSTRUCTION WITH PLACEMENT OF TISSUE EXPANDER AND ALLODERM Bilateral 12/07/2021   Procedure: BREAST RECONSTRUCTION WITH PLACEMENT OF TISSUE EXPANDER AND ALLODERM;  Surgeon: Irene Limbo, MD;  Location: [facility];  Service: Plastics;  Laterality: Bilateral;   CESAREAN SECTION  11/14/2011   Procedure: CESAREAN SECTION;  Surgeon: Eli Hose, MD;  Location: [facility];  Service: Gynecology;  Laterality: N/A;  Primary cesarean section with delivery of baby boy at [phone]. Apgars 9/9.   CESAREAN SECTION N/A 03/22/2014   Procedure: Repeat CESAREAN SECTION;  Surgeon: Alwyn Pea, MD;  Location: [facility];  Service: Obstetrics;  Laterality: N/A;   COSMETIC SURGERY  11/22/2017   Abdominoplasty   FOOT SURGERY     FRACTURE SURGERY  08/1999   Repair of 5th metatarsal in left foot   HERNIA REPAIR  11/22/2017   NIPPLE SPARING MASTECTOMY Left 12/07/2021   Procedure: LEFT NIPPLE SPARING MASTECTOMY;  Surgeon: Rolm Bookbinder, MD;  Location: [facility];  Service: General;  Laterality: Left;   NIPPLE SPARING MASTECTOMY WITH SENTINEL LYMPH NODE BIOPSY Right 12/07/2021   Procedure: RIGHT NIPPLE SPARING MASTECTOMY WITH AXILLARY SENTINEL LYMPH NODE BIOPSY;  Surgeon: Rolm Bookbinder, MD;  Location: [facility];  Service: General;  Laterality: Right;   PORTACATH PLACEMENT N/A 06/24/2021   Procedure: INSERTION PORT-A-CATH;  Surgeon: Coralie Keens, MD;  Location: [facility];  Service: General;  Laterality:  N/A;   REMOVAL OF BILATERAL TISSUE EXPANDERS WITH PLACEMENT OF BILATERAL BREAST IMPLANTS Bilateral 07/27/2022   Procedure: REMOVAL OF BILATERAL TISSUE EXPANDERS WITH PLACEMENT OF BILATERAL BREAST IMPLANTS;  Surgeon: Irene Limbo, MD;  Location: [facility];  Service: Plastics;  Laterality: Bilateral;   TUBAL LIGATION  11/22/2017   Tubes were  completely removed   WISDOM TOOTH EXTRACTION      SOCIAL HISTORY: Social History   Socioeconomic History   Marital status: Married    Spouse name: Not on file   Number of children: Not on file   Years of education: Not on file   Highest education level: Not on file  Occupational History   Not on file  Tobacco Use   Smoking status: Never   Smokeless tobacco: Never  Vaping Use   Vaping Use: Never used  Substance and Sexual Activity   Alcohol use: Not Currently    Alcohol/week: 7.0 standard drinks of alcohol    Types: 7 Standard drinks or equivalent per week    Comment: I stopped drinking alcohol after finding the lump in my brea   Drug use: No   Sexual activity: Yes    Birth control/protection: Surgical  Other Topics Concern   Not on file  Social History Narrative   Not on file   Social Determinants of Health   Financial Resource Strain: Low Risk  (06/10/2021)   Overall Financial Resource Strain (CARDIA)    Difficulty of Paying Living Expenses: Not very hard  Food Insecurity: No Food Insecurity (06/10/2021)   Hunger Vital Sign    Worried About Running Out of Food in the Last Year: Never true    Ran Out of Food in the Last Year: Never true  Transportation Needs: No Transportation Needs (06/10/2021)   PRAPARE - Hydrologist (Medical): No    Lack of Transportation (Non-Medical): No  Physical Activity: Not on file  Stress: Not on file  Social Connections: Not on file  Intimate Partner Violence: Not on file    FAMILY HISTORY: Family History  Problem Relation Age of Onset   Breast cancer Mother 38   Osteopenia Mother    Arthritis Mother    Other Mother        BRCA2 gene mutation   Ulcerative colitis Mother    Hypertension Father    Kidney disease Father    Depression Father    Hyperlipidemia Father    Melanoma Father 46   Hypertension Brother    Hyperlipidemia Brother    Breast cancer Maternal Grandmother        dx. late 30s/40s    Heart disease Maternal Grandfather    Other Maternal Grandfather        BRCA2 gene mutation   Ovarian cancer Paternal Grandmother        dx. 49s   Stroke Paternal Grandfather    Birth defects Son    Anesthesia problems Neg Hx    Diabetes Neg Hx     Review of Systems  Constitutional:  Negative for appetite change, chills, fatigue, fever and unexpected weight change.  HENT:   Negative for hearing loss, lump/mass and trouble swallowing.   Eyes:  Negative for eye problems and icterus.  Respiratory:  Negative for chest tightness, cough and shortness of breath.   Cardiovascular:  Negative for chest pain, leg swelling and palpitations.  Gastrointestinal:  Negative for abdominal distention, abdominal pain, constipation, diarrhea, nausea and vomiting.  Endocrine: Negative for hot flashes.  Genitourinary:  Negative for difficulty urinating.   Musculoskeletal:  Negative for arthralgias.  Skin:  Negative for itching and rash.  Neurological:  Negative for dizziness, extremity weakness, headaches and numbness.  Hematological:  Negative for adenopathy. Does not bruise/bleed easily.  Psychiatric/Behavioral:  Negative for depression. The patient is not nervous/anxious.       PHYSICAL EXAMINATION Patient sounds well.    LABORATORY DATA:  CBC    Component Value Date/Time   WBC 7.8 11/13/2021 0815   WBC 9.9 09/12/2021 0028   RBC 3.37 (L) 11/13/2021 0815   HGB 11.5 (L) 11/13/2021 0815   HCT 33.0 (L) 11/13/2021 0815   PLT 170 11/13/2021 0815   MCV 97.9 11/13/2021 0815   MCH 34.1 (H) 11/13/2021 0815   MCHC 34.8 11/13/2021 0815   RDW 13.1 11/13/2021 0815   LYMPHSABS 1.1 11/13/2021 0815   MONOABS 0.4 11/13/2021 0815   EOSABS 0.1 11/13/2021 0815   BASOSABS 0.0 11/13/2021 0815    CMP     Component Value Date/Time   NA 140 11/13/2021 0815   K 3.9 11/13/2021 0815   CL 108 11/13/2021 0815   CO2 28 11/13/2021 0815   GLUCOSE 105 (H) 11/13/2021 0815   BUN 10 11/13/2021 0815   CREATININE  0.59 11/13/2021 0815   CALCIUM 9.5 11/13/2021 0815   PROT 6.6 11/13/2021 0815   ALBUMIN 4.2 11/13/2021 0815   AST 27 11/13/2021 0815   ALT 32 11/13/2021 0815   ALKPHOS 81 11/13/2021 0815   BILITOT 0.4 11/13/2021 0815   GFRNONAA >60 11/13/2021 0815   GFRAA >90 11/26/2011 2008       ASSESSMENT and THERAPY PLAN:   Malignant neoplasm of upper-outer quadrant of right breast in female, estrogen receptor positive ([place]) Ebony Lamb is a 47 year old woman with history of stage IIa ER/PR positive breast cancer diagnosed in September 2022 status post neoadjuvant chemotherapy, bilateral mastectomies, adjuvant radiation, and antiestrogen with tamoxifen which began in May 2023.  She has opted to continue on tamoxifen based on her bone density results which demonstrated osteoporosis.  She did not start the anastrozole and does not plan to do so.  I have canceled her April 2024 appointment with Dr. Lindi Adie instead she will come in person for labs and follow-up in July 2024.  Osteoporosis Ebony Lamb's bone density testing demonstrates osteoporosis in the AP spine.  We discussed bone density in detail today.  The things that will help her bone density include tamoxifen daily, calcium 1200 mg a day, and vitamin D3 2000 IU daily.  She has a history of vitamin D deficiency however she has incorporated a weekly prescription vitamin D supplement into her regimen and has completed this successfully.  At this point she should be good to change to daily supplementation.  We also discussed a magnesium supplement of 400 mg daily as well as weightbearing exercises.  I reviewed with Ebony Lamb that we will repeat bone density testing in January 2026.  We will monitor her calcium and vitamin D levels to ensure optimal supplementation to maintain her bone density.  She will return in 6 months for labs and a in person visit with Dr. Lindi Adie.   all questions were answered. The patient knows to call the clinic with any problems, questions  or concerns. We can certainly see the patient much sooner if necessary.  Follow up instructions:    -Return to cancer center 03/2023 for lab (CBC, CMET, Vitamin D level) and f/u with Dr. Lindi Adie  The patient was provided an  opportunity to ask questions and all were answered. The patient agreed with the plan and demonstrated an understanding of the instructions.   The patient was advised to call back or seek an in-person evaluation if the symptoms worsen or if the condition fails to improve as anticipated.   I provided 25 minutes of non face-to-face telephone visit time during this encounter, and > 50% was spent counseling as documented under my assessment & plan.   Wilber Bihari, NP 10/11/22 9:05 AM Medical Oncology and Hematology [facility] [place], [place] [address] Tel. [phone]    Fax. [phone]

## 2022-10-12 ENCOUNTER — Telehealth: Payer: Self-pay | Admitting: Hematology and Oncology

## 2022-10-12 NOTE — Telephone Encounter (Signed)
Scheduled appointment per 1/29 los. Patient is aware.

## 2022-10-14 ENCOUNTER — Encounter: Payer: Self-pay | Admitting: Genetic Counselor

## 2022-10-26 LAB — SIGNATERA ONLY (NATERA MANAGED)
SIGNATERA MTM READOUT: 0 MTM/ml
SIGNATERA TEST RESULT: NEGATIVE

## 2022-10-27 NOTE — Telephone Encounter (Signed)
Called pt per MD to advise [employer] testing was negative/not detected. Pt verbalized understanding of results and knows [employer] will be in touch to schedule 3 mo repeat lab.   

## 2022-11-25 ENCOUNTER — Ambulatory Visit: Payer: No Typology Code available for payment source | Admitting: Dietician

## 2022-11-25 NOTE — Progress Notes (Signed)
Patient participated in the Nutrition and Cancer webinar. AICR guidelines were reviewed.  [facility] nutrition resources for survivors were encouraged (Nutrition 101, cooking classes, individual MNT with outpatient RDs in weight management and DM management).  Contact information provided.  April Manson, RDN, LDN Registered Dietitian, [place] Part Time Remote (Usual office hours: Tuesday-Thursday) Mobile: [phone] Remote Office: [phone]

## 2022-12-28 ENCOUNTER — Telehealth: Payer: No Typology Code available for payment source | Admitting: Hematology and Oncology

## 2022-12-31 ENCOUNTER — Encounter: Payer: Self-pay | Admitting: Adult Health

## 2023-01-03 ENCOUNTER — Ambulatory Visit: Payer: No Typology Code available for payment source | Attending: Surgery

## 2023-01-03 VITALS — Wt 129.5 lb

## 2023-01-03 DIAGNOSIS — Z483 Aftercare following surgery for neoplasm: Secondary | ICD-10-CM | POA: Insufficient documentation

## 2023-01-03 NOTE — Therapy (Signed)
OUTPATIENT PHYSICAL THERAPY SOZO SCREENING NOTE   Patient Name: Ebony Lamb MRN: [id number] DOB:July 13, 1976, 47 y.o., female Today's Date: 01/03/2023  PCP: Tally Joe, MD REFERRING PROVIDER: Abigail Miyamoto, MD   PT End of Session - 01/03/23 0915     Visit Number 13   # unchanged due to screen only   PT Start Time 0913    PT Stop Time 0918    PT Time Calculation (min) 5 min    Activity Tolerance Patient tolerated treatment well    Behavior During Therapy [facility] for tasks assessed/performed             Past Medical History:  Diagnosis Date   Abnormal Pap smear 09/13/2000   Allergy 02/2020   Seasonal   Breast cancer ([facility])    Depression    history of depression- denies currently   Hypertension    Port-A-Cath in place 08/10/2021   Preterm labor    Past Surgical History:  Procedure Laterality Date   BREAST BIOPSY Right 07/27/2019   BREAST RECONSTRUCTION Right 07/27/2022   Procedure: RIGHT NIPPLE REDUCTION;  Surgeon: Glenna Fellows, MD;  Location: [facility];  Service: Plastics;  Laterality: Right;   BREAST RECONSTRUCTION WITH PLACEMENT OF TISSUE EXPANDER AND ALLODERM Bilateral 12/07/2021   Procedure: BREAST RECONSTRUCTION WITH PLACEMENT OF TISSUE EXPANDER AND ALLODERM;  Surgeon: Glenna Fellows, MD;  Location: [facility];  Service: Plastics;  Laterality: Bilateral;   CESAREAN SECTION  11/14/2011   Procedure: CESAREAN SECTION;  Surgeon: Janine Limbo, MD;  Location: [facility];  Service: Gynecology;  Laterality: N/A;  Primary cesarean section with delivery of baby boy at [phone]. Apgars 9/9.   CESAREAN SECTION N/A 03/22/2014   Procedure: Repeat CESAREAN SECTION;  Surgeon: Esmeralda Arthur, MD;  Location: [facility];  Service: Obstetrics;  Laterality: N/A;   COSMETIC SURGERY  11/22/2017   Abdominoplasty   FOOT SURGERY     FRACTURE SURGERY  08/1999   Repair of 5th metatarsal in left foot   HERNIA REPAIR  11/22/2017   NIPPLE SPARING MASTECTOMY Left  12/07/2021   Procedure: LEFT NIPPLE SPARING MASTECTOMY;  Surgeon: Emelia Loron, MD;  Location: [facility];  Service: General;  Laterality: Left;   NIPPLE SPARING MASTECTOMY WITH SENTINEL LYMPH NODE BIOPSY Right 12/07/2021   Procedure: RIGHT NIPPLE SPARING MASTECTOMY WITH AXILLARY SENTINEL LYMPH NODE BIOPSY;  Surgeon: Emelia Loron, MD;  Location: [facility];  Service: General;  Laterality: Right;   PORTACATH PLACEMENT N/A 06/24/2021   Procedure: INSERTION PORT-A-CATH;  Surgeon: Abigail Miyamoto, MD;  Location: [facility];  Service: General;  Laterality: N/A;   REMOVAL OF BILATERAL TISSUE EXPANDERS WITH PLACEMENT OF BILATERAL BREAST IMPLANTS Bilateral 07/27/2022   Procedure: REMOVAL OF BILATERAL TISSUE EXPANDERS WITH PLACEMENT OF BILATERAL BREAST IMPLANTS;  Surgeon: Glenna Fellows, MD;  Location: [facility];  Service: Plastics;  Laterality: Bilateral;   TUBAL LIGATION  11/22/2017   Tubes were completely removed   WISDOM TOOTH EXTRACTION     Patient Active Problem List   Diagnosis Date Noted   Osteoporosis 10/11/2022   Vitamin D deficiency 10/11/2022   Genetic testing 06/17/2021   Family history of breast cancer 06/10/2021   Family history of ovarian cancer 06/10/2021   Family history of BRCA gene mutation 06/10/2021   Malignant neoplasm of upper-outer quadrant of right breast in female, estrogen receptor positive 06/08/2021   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 She underwent neoadjuvant chemotherapy and then had bilateral mastectomies with a right sentinel node biopsy (4 negative nodes) on 12/07/2021. 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   PRECAUTIONS: right UE Lymphedema risk, None  SUBJECTIVE: Pt returns for her 3 month L-Dex screen .   PAIN:  Are you having pain? No  SOZO SCREENING: Patient was assessed today using the SOZO machine to determine the lymphedema index score. This was compared to her baseline score. It was determined that she is within the recommended range when compared to her baseline and no further action is needed at this time. She will continue SOZO screenings. These are done every 3 months for 2 years post operatively followed by every 6 months for 2 years, and then annually.  Educated pt to resume HEP that she has been nervous about since implant exchange. She also knows she can reach out to her doctor for physical therapy if her tightness doesn't resolve with HEP stretching.   L-DEX FLOWSHEETS - 01/03/23 0900       L-DEX LYMPHEDEMA SCREENING   Measurement Type Unilateral    L-DEX MEASUREMENT EXTREMITY Upper Extremity    POSITION  Standing    DOMINANT SIDE Right    At Risk Side Right    BASELINE SCORE (UNILATERAL) -0.7    L-DEX SCORE (UNILATERAL) -6.5    VALUE CHANGE (UNILAT) -5.8              Hermenia Bers, PTA 01/03/2023, 9:18 AM

## 2023-02-02 ENCOUNTER — Telehealth: Payer: Self-pay

## 2023-02-02 NOTE — Telephone Encounter (Signed)
Attempted to call pt regarding signatera results lvm that results was negative.

## 2023-02-03 LAB — SIGNATERA: SIGNATERA TEST RESULT: NEGATIVE

## 2023-02-08 ENCOUNTER — Inpatient Hospital Stay: Payer: No Typology Code available for payment source | Attending: Adult Health | Admitting: Adult Health

## 2023-02-08 ENCOUNTER — Telehealth: Payer: Self-pay

## 2023-02-08 VITALS — BP 129/88 | HR 76 | Temp 98.1°F | Wt 129.3 lb

## 2023-02-08 DIAGNOSIS — E559 Vitamin D deficiency, unspecified: Secondary | ICD-10-CM | POA: Diagnosis not present

## 2023-02-08 DIAGNOSIS — Z1501 Genetic susceptibility to malignant neoplasm of breast: Secondary | ICD-10-CM | POA: Insufficient documentation

## 2023-02-08 DIAGNOSIS — M810 Age-related osteoporosis without current pathological fracture: Secondary | ICD-10-CM | POA: Insufficient documentation

## 2023-02-08 DIAGNOSIS — Z803 Family history of malignant neoplasm of breast: Secondary | ICD-10-CM | POA: Diagnosis not present

## 2023-02-08 DIAGNOSIS — Z8041 Family history of malignant neoplasm of ovary: Secondary | ICD-10-CM | POA: Insufficient documentation

## 2023-02-08 DIAGNOSIS — Z170 Estrogen receptor positive status [ER+]: Secondary | ICD-10-CM | POA: Diagnosis not present

## 2023-02-08 DIAGNOSIS — C50411 Malignant neoplasm of upper-outer quadrant of right female breast: Secondary | ICD-10-CM | POA: Diagnosis present

## 2023-02-08 NOTE — Progress Notes (Signed)
Pt was seen in Lillard Anes, NP's office today.  [name], NP placed an order for US of the axilla.  I contacted GI and a scheduler told me that I needed a MM order as well so I reached out to Lucia Gaskins at [email or web address] to see if she can get this scheduled.

## 2023-02-08 NOTE — Telephone Encounter (Signed)
Pt called requesting provider appt, Pt states she has new lumps under right axilla and would like them examined. [facility] appt with NP made for same day. Pt verbalized understanding.

## 2023-02-08 NOTE — Assessment & Plan Note (Signed)
Ebony Lamb is a 47 year old woman with history of stage IIa IDC ER/PR + s/p neoadjuvant chemotherapy, bilateral mastectomies, adjuvant tamoxifen and adjuvant radiation.    Axillary nodule: Since there is an area of focal tenderness I placed orders for right axillary ultrasound to further and evaluate.  She will let us know if this resolves it if she wishes to cancel her appointment.  She will also let us know if it worsens or if she develops any symptoms such as swelling erythema warmth fever or chills.  Montessa has labs and follow-up with Dr. Pamelia Hoit in July 2024 and she will keep that appointment.

## 2023-02-08 NOTE — Progress Notes (Signed)
[facility] Cancer Follow up:    Ebony Joe, MD [address] [place] [address]   DIAGNOSIS:  Cancer Staging  Malignant neoplasm of upper-outer quadrant of right breast in female, estrogen receptor positive (HCC) Staging form: Breast, AJCC 8th Edition - Clinical stage from 06/10/2021: Stage IIA (cT2, cN0, cM0, G3, ER+, PR+, HER2-) - Signed by Lowella Dell, MD on 06/10/2021 Stage prefix: Initial diagnosis Histologic grading system: 3 grade system Laterality: Right Staged by: Pathologist and managing physician Stage used in treatment planning: Yes National guidelines used in treatment planning: Yes Type of national guideline used in treatment planning: NCCN   SUMMARY OF ONCOLOGIC HISTORY: Oncology History  Malignant neoplasm of upper-outer quadrant of right breast in female, estrogen receptor positive (HCC)  06/04/2021 Initial Diagnosis   T2 N0 IDC, grade 3, ER 15% weak to moderate staining, PR 15% weak to moderate staining (functionally triple negative with HER2 not amplified), with a Ki-67 of 70% (breast MRI 3.1 cm mass)   06/10/2021 Cancer Staging   Staging form: Breast, AJCC 8th Edition - Clinical stage from 06/10/2021: Stage IIA (cT2, cN0, cM0, G3, ER+, PR+, HER2-) - Signed by Lowella Dell, MD on 06/10/2021 Stage prefix: Initial diagnosis Histologic grading system: 3 grade system Laterality: Right Staged by: Pathologist and managing physician Stage used in treatment planning: Yes National guidelines used in treatment planning: Yes Type of national guideline used in treatment planning: NCCN   06/16/2021 Genetic Testing   [facility] CancerNext-Expanded was negative. No pathogenic variants were identified. 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 Report date is 06/19/2021.  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 Report date is May 2023.  The CancerNext-Expanded gene panel offered by  [facility] and includes sequencing, rearrangement, and RNA analysis for the following 77 genes: AIP, ALK, APC, ATM, AXIN2, BAP1, BARD1, BLM, BMPR1A, BRCA1, BRCA2, BRIP1, CDC73, CDH1, CDK4, CDKN1B, CDKN2A, CHEK2, CTNNA1, DICER1, FANCC, FH, FLCN, GALNT12, KIF1B, LZTR1, MAX, MEN1, MET, MLH1, MSH2, MSH3, MSH6, MUTYH, NBN, NF1, NF2, NTHL1, PALB2, PHOX2B, PMS2, POT1, PRKAR1A, PTCH1, PTEN, RAD51C, RAD51D, RB1, RECQL, RET, SDHA, SDHAF2, SDHB, SDHC, SDHD, SMAD4, SMARCA4, SMARCB1, SMARCE1, STK11, SUFU, TMEM127, TP53, TSC1, TSC2, VHL and XRCC2 (sequencing and deletion/duplication); EGFR, EGLN1, HOXB13, KIT, MITF, PDGFRA, POLD1, and POLE (sequencing only); EPCAM and GREM1 (deletion/duplication only).    06/25/2021 -  Neo-Adjuvant Chemotherapy   neoadjuvant chemotherapy consisting of doxorubicin and cyclophosphamide in dose dense fashion x4 started 0/13/2022, completed 08/10/2021, to be followed by weekly carboplatin and paclitaxel x12 starting 08/24/2021   12/07/2021 Surgery   Left mastectomy: Benign Right mastectomy: Negative for residual cancer, 0/4 lymph nodes negative pathologic complete response,   01/11/2022 -  Anti-estrogen oral therapy   Tamoxifen x 10 years   01/20/2022 - 03/01/2022 Radiation Therapy   Site Technique Total Dose (Gy) Dose per Fx (Gy) Completed Fx Beam Energies  Chest Wall, Right: CW_R 3D 50.4/50.4 1.8 28/28 6XFFF       CURRENT THERAPY: tamoxifen  INTERVAL HISTORY: Ebony Lamb 47 y.o. female returns for f/u of a new area of concern in her right axilla.  She notes that after traveling this past week she has developed a slight area of nodularity at the left lateral axillary node biopsy scar line, that is slightly tender as well.  She is s/p bilateral mastectomies.   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  MEDICAL HISTORY: Past Medical History:  Diagnosis Date   Abnormal Pap smear 09/13/2000   Allergy 02/2020   Seasonal   Breast cancer (HCC)    Depression    history of depression- denies currently   Hypertension    Port-A-Cath in place 08/10/2021   Preterm labor     SURGICAL HISTORY: Past Surgical History:  Procedure Laterality Date   BREAST BIOPSY Right 07/27/2019   BREAST RECONSTRUCTION Right 07/27/2022   Procedure: RIGHT NIPPLE REDUCTION;  Surgeon: Glenna Fellows, MD;  Location: [facility];  Service: Plastics;  Laterality: Right;   BREAST RECONSTRUCTION WITH PLACEMENT OF TISSUE EXPANDER AND ALLODERM Bilateral 12/07/2021   Procedure: BREAST RECONSTRUCTION WITH PLACEMENT OF TISSUE EXPANDER AND ALLODERM;  Surgeon: Glenna Fellows, MD;  Location: [facility];  Service: Plastics;  Laterality: Bilateral;   CESAREAN SECTION  11/14/2011   Procedure: CESAREAN SECTION;  Surgeon: Janine Limbo, MD;  Location: [facility];  Service: Gynecology;  Laterality: N/A;  Primary cesarean section with delivery of baby boy at [phone]. Apgars 9/9.   CESAREAN SECTION N/A 03/22/2014   Procedure: Repeat CESAREAN SECTION;  Surgeon: Esmeralda Arthur, MD;  Location: [facility];  Service: Obstetrics;  Laterality: N/A;   COSMETIC SURGERY  11/22/2017   Abdominoplasty   FOOT SURGERY     FRACTURE SURGERY  08/1999   Repair of 5th metatarsal  in left foot   HERNIA REPAIR  11/22/2017   NIPPLE SPARING MASTECTOMY Left 12/07/2021   Procedure: LEFT NIPPLE SPARING MASTECTOMY;  Surgeon: Emelia Loron, MD;  Location: [facility];  Service: General;  Laterality: Left;   NIPPLE SPARING MASTECTOMY WITH SENTINEL LYMPH NODE BIOPSY Right 12/07/2021   Procedure: RIGHT NIPPLE SPARING MASTECTOMY WITH AXILLARY SENTINEL LYMPH NODE BIOPSY;  Surgeon: Emelia Loron, MD;  Location: [facility];  Service: General;  Laterality: Right;   PORTACATH PLACEMENT N/A 06/24/2021   Procedure: INSERTION PORT-A-CATH;  Surgeon: Abigail Miyamoto, MD;  Location: [facility];  Service: General;  Laterality: N/A;   REMOVAL OF BILATERAL TISSUE EXPANDERS WITH PLACEMENT OF BILATERAL BREAST IMPLANTS Bilateral 07/27/2022   Procedure: REMOVAL OF BILATERAL TISSUE EXPANDERS WITH PLACEMENT OF BILATERAL BREAST IMPLANTS;  Surgeon: Glenna Fellows, MD;  Location: [facility];  Service: Plastics;  Laterality: Bilateral;   TUBAL LIGATION  11/22/2017   Tubes were completely removed   WISDOM TOOTH EXTRACTION      SOCIAL HISTORY: Social History   Socioeconomic History   Marital status: Married    Spouse name: Not on file   Number of children: Not on file   Years of education: Not on file   Highest education level: Not on file  Occupational History   Not on file  Tobacco Use   Smoking status: Never   Smokeless tobacco: Never  Vaping Use   Vaping Use:  Never used  Substance and Sexual Activity   Alcohol use: Not Currently    Alcohol/week: 7.0 standard drinks of alcohol    Types: 7 Standard drinks or equivalent per week    Comment: I stopped drinking alcohol after finding the lump in my brea   Drug use: No   Sexual activity: Yes    Birth control/protection: Surgical  Other Topics Concern   Not on file  Social History Narrative   Not on file   Social Determinants of Health   Financial Resource Strain: Low  Risk  (06/10/2021)   Overall Financial Resource Strain (CARDIA)    Difficulty of Paying Living Expenses: Not very hard  Food Insecurity: No Food Insecurity (06/10/2021)   Hunger Vital Sign    Worried About Running Out of Food in the Last Year: Never true    Ran Out of Food in the Last Year: Never true  Transportation Needs: No Transportation Needs (06/10/2021)   PRAPARE - Administrator, Civil Service (Medical): No    Lack of Transportation (Non-Medical): No  Physical Activity: Not on file  Stress: Not on file  Social Connections: Not on file  Intimate Partner Violence: Not on file    FAMILY HISTORY: Family History  Problem Relation Age of Onset   Breast cancer Mother 47   Osteopenia Mother    Arthritis Mother    Other Mother        BRCA2 gene mutation   Ulcerative colitis Mother    Hypertension Father    Kidney disease Father    Depression Father    Hyperlipidemia Father    Melanoma Father 24   Hypertension Brother    Hyperlipidemia Brother    Breast cancer Maternal Grandmother        dx. late 30s/40s   Heart disease Maternal Grandfather    Other Maternal Grandfather        BRCA2 gene mutation   Ovarian cancer Paternal Grandmother        dx. 40s   Stroke Paternal Grandfather    Birth defects Son    Anesthesia problems Neg Hx    Diabetes Neg Hx     Review of Systems  Constitutional:  Negative for appetite change, chills, fatigue, fever and unexpected weight change.  HENT:   Negative for hearing loss, lump/mass and trouble swallowing.   Eyes:  Negative for eye problems and icterus.  Respiratory:  Negative for chest tightness, cough and shortness of breath.   Cardiovascular:  Negative for chest pain, leg swelling and palpitations.  Gastrointestinal:  Negative for abdominal distention, abdominal pain, constipation, diarrhea, nausea and vomiting.  Endocrine: Negative for hot flashes.  Genitourinary:  Negative for difficulty urinating.   Musculoskeletal:   Negative for arthralgias.  Skin:  Negative for itching and rash.  Neurological:  Negative for dizziness, extremity weakness, headaches and numbness.  Hematological:  Negative for adenopathy. Does not bruise/bleed easily.  Psychiatric/Behavioral:  Negative for depression. The patient is not nervous/anxious.       PHYSICAL EXAMINATION    Vitals:   02/08/23 1456  BP: 129/88  Pulse: 76  Temp: 98.1 F (36.7 C)  SpO2: 100%    Physical Exam Constitutional:      General: She is not in acute distress.    Appearance: Normal appearance.  HENT:     Head: Normocephalic and atraumatic.  Eyes:     General: No scleral icterus. Chest:     Comments: Right axilla with small nodule, mobile, slight  TTP Skin:    General: Skin is warm and dry.     Findings: No rash.  Neurological:     Mental Status: She is alert.  Psychiatric:        Mood and Affect: Mood normal.        Behavior: Behavior normal.     LABORATORY DATA: None today    ASSESSMENT and THERAPY PLAN:   Malignant neoplasm of upper-outer quadrant of right breast in female, estrogen receptor positive (HCC) Ebony Lamb is a 47 year old woman with history of stage IIa IDC ER/PR + s/p neoadjuvant chemotherapy, bilateral mastectomies, adjuvant tamoxifen and adjuvant radiation.    Axillary nodule: Since there is an area of focal tenderness I placed orders for right axillary ultrasound to further and evaluate.  She will let us know if this resolves it if she wishes to cancel her appointment.  She will also let us know if it worsens or if she develops any symptoms such as swelling erythema warmth fever or chills.  Karne has labs and follow-up with Dr. Pamelia Hoit in July 2024 and she will keep that appointment.     All questions were answered. The patient knows to call the clinic with any problems, questions or concerns. We can certainly see the patient much sooner if necessary.  Total encounter time:20 minutes*in face-to-face visit time,  chart review, lab review, care coordination, order entry, and documentation of the encounter time.  Lillard Anes, NP 02/08/23 3:41 PM Medical Oncology and Hematology [facility] [address] [place], [place] [address] Tel. [phone]    Fax. [phone]  *Total Encounter Time as defined by the Centers for Medicare and Medicaid Services includes, in addition to the face-to-face time of a patient visit (documented in the note above) non-face-to-face time: obtaining and reviewing outside history, ordering and reviewing medications, tests or procedures, care coordination (communications with other health care professionals or caregivers) and documentation in the medical record.

## 2023-02-23 ENCOUNTER — Ambulatory Visit
Admission: RE | Admit: 2023-02-23 | Discharge: 2023-02-23 | Disposition: A | Discharge status: Home / Self Care | Payer: No Typology Code available for payment source | Source: Ambulatory Visit | Attending: Adult Health | Admitting: Adult Health

## 2023-02-23 DIAGNOSIS — Z170 Estrogen receptor positive status [ER+]: Secondary | ICD-10-CM

## 2023-03-04 ENCOUNTER — Encounter (HOSPITAL_BASED_OUTPATIENT_CLINIC_OR_DEPARTMENT_OTHER): Payer: Self-pay

## 2023-03-04 ENCOUNTER — Emergency Department (HOSPITAL_BASED_OUTPATIENT_CLINIC_OR_DEPARTMENT_OTHER)
Admission: EM | Admit: 2023-03-04 | Discharge: 2023-03-04 | Disposition: A | Discharge status: Home / Self Care | Payer: No Typology Code available for payment source | Attending: Emergency Medicine | Admitting: Emergency Medicine

## 2023-03-04 ENCOUNTER — Other Ambulatory Visit: Payer: Self-pay

## 2023-03-04 ENCOUNTER — Emergency Department (HOSPITAL_BASED_OUTPATIENT_CLINIC_OR_DEPARTMENT_OTHER): Payer: No Typology Code available for payment source | Admitting: Radiology

## 2023-03-04 DIAGNOSIS — R0602 Shortness of breath: Secondary | ICD-10-CM | POA: Diagnosis present

## 2023-03-04 DIAGNOSIS — Z79899 Other long term (current) drug therapy: Secondary | ICD-10-CM | POA: Diagnosis not present

## 2023-03-04 DIAGNOSIS — I10 Essential (primary) hypertension: Secondary | ICD-10-CM | POA: Diagnosis not present

## 2023-03-04 DIAGNOSIS — R002 Palpitations: Secondary | ICD-10-CM | POA: Diagnosis not present

## 2023-03-04 DIAGNOSIS — Z853 Personal history of malignant neoplasm of breast: Secondary | ICD-10-CM | POA: Insufficient documentation

## 2023-03-04 LAB — CBC WITH DIFFERENTIAL/PLATELET
Abs Immature Granulocytes: 0.01 10*3/uL (ref 0.00–0.07)
Basophils Absolute: 0 10*3/uL (ref 0.0–0.1)
Basophils Relative: 1 %
Eosinophils Absolute: 0.1 10*3/uL (ref 0.0–0.5)
Eosinophils Relative: 2 %
HCT: 38.6 % (ref 36.0–46.0)
Hemoglobin: 13.3 g/dL (ref 12.0–15.0)
Immature Granulocytes: 0 %
Lymphocytes Relative: 33 %
Lymphs Abs: 2.3 10*3/uL (ref 0.7–4.0)
MCH: 32.1 pg (ref 26.0–34.0)
MCHC: 34.5 g/dL (ref 30.0–36.0)
MCV: 93.2 fL (ref 80.0–100.0)
Monocytes Absolute: 0.3 10*3/uL (ref 0.1–1.0)
Monocytes Relative: 4 %
Neutro Abs: 4.3 10*3/uL (ref 1.7–7.7)
Neutrophils Relative %: 60 %
Platelets: 209 10*3/uL (ref 150–400)
RBC: 4.14 MIL/uL (ref 3.87–5.11)
RDW: 12 % (ref 11.5–15.5)
WBC: 7 10*3/uL (ref 4.0–10.5)
nRBC: 0 % (ref 0.0–0.2)

## 2023-03-04 LAB — BASIC METABOLIC PANEL
Anion gap: 10 (ref 5–15)
BUN: 12 mg/dL (ref 6–20)
CO2: 24 mmol/L (ref 22–32)
Calcium: 9 mg/dL (ref 8.9–10.3)
Chloride: 105 mmol/L (ref 98–111)
Creatinine, Ser: 0.83 mg/dL (ref 0.44–1.00)
GFR, Estimated: >60 mL/min (ref >60)
Glucose, Bld: 88 mg/dL (ref 70–99)
Potassium: 3.5 mmol/L (ref 3.5–5.1)
Sodium: 139 mmol/L (ref 135–145)

## 2023-03-04 LAB — D-DIMER, QUANTITATIVE: D-Dimer, Quant: <0.27 ug/mL-FEU (ref 0.00–0.50)

## 2023-03-04 NOTE — ED Notes (Signed)
Reviewed AVS/discharge instruction with patient. Time allotted for and all questions answered. Patient is agreeable for d/c and escorted to ed exit by staff.  

## 2023-03-04 NOTE — Discharge Instructions (Signed)
You have been seen today for your complaint of shortness of breath. Your lab work  was reassuring and showed no abnormalities. Your imaging was reassuring and showed no abnormalities. Follow up with: your PCP within the next week for reevaluation of your symptoms. Please seek immediate medical care if you develop any of the following symptoms: Your shortness of breath gets worse. You have shortness of breath when you are resting. You feel light-headed or you faint. You have a cough that is not controlled with medicines. You cough up blood. You have pain with breathing. You have pain in your chest, arms, shoulders, or abdomen. You have a fever. At this time there does not appear to be the presence of an emergent medical condition, however there is always the potential for conditions to change. Please read and follow the below instructions.  Do not take your medicine if  develop an itchy rash, swelling in your mouth or lips, or difficulty breathing; call 911 and seek immediate emergency medical attention if this occurs.  You may review your lab tests and imaging results in their entirety on your MyChart account.  Please discuss all results of fully with your primary care provider and other specialist at your follow-up visit.  Note: Portions of this text may have been transcribed using voice recognition software. Every effort was made to ensure accuracy; however, inadvertent computerized transcription errors may still be present.

## 2023-03-04 NOTE — ED Provider Notes (Signed)
[place] EMERGENCY DEPARTMENT AT [facility] Provider Note   CSN: [id number] Arrival date & time: 03/04/23  1327     History  Chief Complaint  Patient presents with   Shortness of Breath   Palpitations    Ebony Lamb is a 47 y.o. female.  With history of breast cancer currently on tamoxifen, anxiety, depression, hypertension who presents to the ED for evaluation of shortness of breath.  She noticed this approximately 2 weeks ago.  States it started as exertional shortness of breath.  States she now feels slightly short of breath even at rest.  She was evaluated by her PCP at the time of symptom onset and had a negative D-dimer at that time.  She denies any cough.  No fevers or chills.  No night sweats.  Denies history of DVT or PE.  No unilateral leg swelling.  No long distance travel or recent surgeries.  She did have some chest pain a week ago but has not had any since that time.   Shortness of Breath Palpitations Associated symptoms: shortness of breath        Home Medications Prior to Admission medications   Medication Sig Start Date End Date Taking? Authorizing Provider  acetaminophen (TYLENOL) 500 MG tablet Take by mouth. 07/02/21   [provider]  calcium carbonate (OS-CAL) 600 MG TABS tablet Take by mouth.    [provider]  cholecalciferol (VITAMIN D3) 25 MCG (1000 UNIT) tablet Take 1,000 Units by mouth daily.    [provider]  loratadine (CLARITIN) 10 MG tablet Take 10 mg by mouth as needed for allergies.    [provider]  magnesium (MAGTAB) 84 MG ( ) TBCR SR tablet Take by mouth.    [provider]  tamoxifen (NOLVADEX) 20 MG tablet Take 20 mg by mouth daily.    [provider]  ferrous sulfate (FERROUSUL) 325 (65 FE) MG tablet Take 1 tablet (325 mg total) by mouth 2 (two) times daily. 11/17/11 11/26/11  Kizzie Fantasia, NP      Allergies    Cefepime    Review of Systems   Review of Systems   Respiratory:  Positive for shortness of breath.   Cardiovascular:  Positive for palpitations.  All other systems reviewed and are negative.   Physical Exam Updated Vital Signs BP 118/76   Pulse 75   Temp 98.3 F (36.8 C)   Resp 15   Ht 5\' 6"  (1.676 m)   Wt 59 kg   SpO2 100%   BMI 20.98 kg/m  Physical Exam Vitals and nursing note reviewed.  Constitutional:      General: She is not in acute distress.    Appearance: She is well-developed. She is not ill-appearing, toxic-appearing or diaphoretic.     Comments: Resting comfortably in bed  HENT:     Head: Normocephalic and atraumatic.  Eyes:     Conjunctiva/sclera: Conjunctivae normal.  Neck:     Vascular: No JVD.  Cardiovascular:     Rate and Rhythm: Normal rate and regular rhythm.     Heart sounds: No murmur heard. Pulmonary:     Effort: Pulmonary effort is normal. No respiratory distress.     Breath sounds: No decreased breath sounds, wheezing, rhonchi or rales.  Abdominal:     Palpations: Abdomen is soft.     Tenderness: There is no abdominal tenderness.  Musculoskeletal:        General: No swelling.     Cervical back: Neck  supple.     Right lower leg: No edema.     Left lower leg: No edema.  Skin:    General: Skin is warm and dry.     Capillary Refill: Capillary refill takes less than 2 seconds.  Neurological:     Mental Status: She is alert.  Psychiatric:        Mood and Affect: Mood normal.     ED Results / Procedures / Treatments   Labs (all labs ordered are listed, but only abnormal results are displayed) Labs Reviewed  BASIC METABOLIC PANEL  CBC WITH DIFFERENTIAL/PLATELET  D-DIMER, QUANTITATIVE    EKG None  Radiology DG Chest 2 View  Result Date: 03/04/2023 CLINICAL DATA:  sob EXAM: CHEST - 2 VIEW COMPARISON:  None Available. FINDINGS: No pleural effusion. No pneumothorax. No focal airspace opacity. No radiographically apparent displaced rib fractures. Normal cardiac and mediastinal contours.  Visualized upper abdomen is unremarkable. Vertebral body heights are maintained. Surgical clips in the right axilla IMPRESSION: No focal airspace opacity. Electronically Signed   By: Lorenza Cambridge M.D.   On: 03/04/2023 15:04    Procedures Procedures    Medications Ordered in ED Medications - No data to display  ED Course/ Medical Decision Making/ A&P                             Medical Decision Making Amount and/or Complexity of Data Reviewed Labs: ordered. Radiology: ordered.  This patient presents to the ED for concern of shortness of breath, this involves an extensive number of treatment options, and is a complaint that carries with it a high risk of complications and morbidity. The emergent differential diagnosis for shortness of breath includes, but is not limited to, Pulmonary edema, bronchoconstriction, Pneumonia, Pulmonary embolism, Pneumotherax/ Hemothorax, Dysrythmia, ACS.    Co morbidities that complicate the patient evaluation  breast cancer currently on tamoxifen, anxiety, depression, hypertension  My initial workup includes labs, imaging, EKG  Additional history obtained from: Nursing notes from this visit.  I ordered, reviewed and interpreted labs which include: BMP, CBC, D-dimer. normal CBC.  Negative D-dimer.  Normal metabolic panel  I ordered imaging studies including chest x-ray I independently visualized and interpreted imaging which showed  I agree with the radiologist interpretation  Cardiac Monitoring:  The patient was maintained on a cardiac monitor.  I personally viewed and interpreted the cardiac monitored which showed an underlying rhythm of: NSR  Afebrile, hemodynamically stable.  47 year old female presenting to the ED for evaluation of 2 weeks of shortness of breath.  No cough.  No fevers or chills.  No chest pain.  She is nontachypneic and nonhypoxic.  No adventitious breath sounds.  Chest x-ray without abnormalities.  D-dimer negative.  Unclear  etiology of her subjective symptoms.  Low concern for acute emergent cardiopulmonary abnormalities as the cause of her symptoms.  She was encouraged to follow-up with her primary care provider regarding ongoing symptoms.  She was given return precautions.  Stable at discharge.  At this time there does not appear to be any evidence of an acute emergency medical condition and the patient appears stable for discharge with appropriate outpatient follow up. Diagnosis was discussed with patient who verbalizes understanding of care plan and is agreeable to discharge. I have discussed return precautions with patient who verbalizes understanding. Patient encouraged to follow-up with their PCP within 1 week. All questions answered.  Note: Portions of this report may have been  transcribed using voice recognition software. Every effort was made to ensure accuracy; however, inadvertent computerized transcription errors may still be present.        Final Clinical Impression(s) / ED Diagnoses Final diagnoses:  Shortness of breath    Rx / DC Orders ED Discharge Orders     None         Michelle Piper, Cordelia Poche 03/04/23 1545    Terrilee Files, MD 03/05/23 1026

## 2023-03-04 NOTE — ED Triage Notes (Signed)
Patient arrives with complaints of worsening shortness of breath x2 weeks. She was evaluated by another doctor and had negative d-dimer (ruled out for blood clots). Patient is still concerned that she may have a blood clot/PE due to a medication she takes.

## 2023-03-05 ENCOUNTER — Other Ambulatory Visit: Payer: Self-pay | Admitting: Hematology and Oncology

## 2023-04-04 ENCOUNTER — Ambulatory Visit: Payer: No Typology Code available for payment source | Attending: Surgery | Admitting: Rehabilitation

## 2023-04-04 DIAGNOSIS — Z170 Estrogen receptor positive status [ER+]: Secondary | ICD-10-CM | POA: Insufficient documentation

## 2023-04-04 DIAGNOSIS — Z483 Aftercare following surgery for neoplasm: Secondary | ICD-10-CM | POA: Insufficient documentation

## 2023-04-04 DIAGNOSIS — C50411 Malignant neoplasm of upper-outer quadrant of right female breast: Secondary | ICD-10-CM | POA: Insufficient documentation

## 2023-04-04 NOTE — Therapy (Signed)
OUTPATIENT PHYSICAL THERAPY SOZO SCREENING NOTE   Patient Name: Ebony Lamb MRN: [id number] DOB:01/15/1976, 47 y.o., female Today's Date: 04/04/2023  PCP: Tally Joe, MD REFERRING PROVIDER: Abigail Miyamoto, MD   PT End of Session - 04/04/23 [phone]     Visit Number 13   screen only   PT Start Time 0905    PT Stop Time 0909    PT Time Calculation (min) 4 min    Activity Tolerance Patient tolerated treatment well    Behavior During Therapy [facility] for tasks assessed/performed             Past Medical History:  Diagnosis Date   Abnormal Pap smear 09/13/2000   Allergy 02/2020   Seasonal   Breast cancer (HCC)    Depression    history of depression- denies currently   Hypertension    Port-A-Cath in place 08/10/2021   Preterm labor    Past Surgical History:  Procedure Laterality Date   BREAST BIOPSY Right 07/27/2019   BREAST RECONSTRUCTION Right 07/27/2022   Procedure: RIGHT NIPPLE REDUCTION;  Surgeon: Glenna Fellows, MD;  Location: [facility];  Service: Plastics;  Laterality: Right;   BREAST RECONSTRUCTION WITH PLACEMENT OF TISSUE EXPANDER AND ALLODERM Bilateral 12/07/2021   Procedure: BREAST RECONSTRUCTION WITH PLACEMENT OF TISSUE EXPANDER AND ALLODERM;  Surgeon: Glenna Fellows, MD;  Location: [facility];  Service: Plastics;  Laterality: Bilateral;   CESAREAN SECTION  11/14/2011   Procedure: CESAREAN SECTION;  Surgeon: Janine Limbo, MD;  Location: [facility];  Service: Gynecology;  Laterality: N/A;  Primary cesarean section with delivery of baby boy at [phone]. Apgars 9/9.   CESAREAN SECTION N/A 03/22/2014   Procedure: Repeat CESAREAN SECTION;  Surgeon: Esmeralda Arthur, MD;  Location: [facility];  Service: Obstetrics;  Laterality: N/A;   COSMETIC SURGERY  11/22/2017   Abdominoplasty   FOOT SURGERY     FRACTURE SURGERY  08/1999   Repair of 5th metatarsal in left foot   HERNIA REPAIR  11/22/2017   NIPPLE SPARING MASTECTOMY Left 12/07/2021    Procedure: LEFT NIPPLE SPARING MASTECTOMY;  Surgeon: Emelia Loron, MD;  Location: [facility];  Service: General;  Laterality: Left;   NIPPLE SPARING MASTECTOMY WITH SENTINEL LYMPH NODE BIOPSY Right 12/07/2021   Procedure: RIGHT NIPPLE SPARING MASTECTOMY WITH AXILLARY SENTINEL LYMPH NODE BIOPSY;  Surgeon: Emelia Loron, MD;  Location: [facility];  Service: General;  Laterality: Right;   PORTACATH PLACEMENT N/A 06/24/2021   Procedure: INSERTION PORT-A-CATH;  Surgeon: Abigail Miyamoto, MD;  Location: [facility];  Service: General;  Laterality: N/A;   REMOVAL OF BILATERAL TISSUE EXPANDERS WITH PLACEMENT OF BILATERAL BREAST IMPLANTS Bilateral 07/27/2022   Procedure: REMOVAL OF BILATERAL TISSUE EXPANDERS WITH PLACEMENT OF BILATERAL BREAST IMPLANTS;  Surgeon: Glenna Fellows, MD;  Location: [facility];  Service: Plastics;  Laterality: Bilateral;   TUBAL LIGATION  11/22/2017   Tubes were completely removed   WISDOM TOOTH EXTRACTION     Patient Active Problem List   Diagnosis Date Noted   Osteoporosis 10/11/2022   Vitamin D deficiency 10/11/2022   Genetic testing 06/17/2021   Family history of breast cancer 06/10/2021   Family history of ovarian cancer 06/10/2021   Family history of BRCA gene mutation 06/10/2021   Malignant neoplasm of upper-outer quadrant of right breast in female, estrogen receptor positive (HCC) 06/08/2021   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/25/2014   Cesarean delivery delivered 03/22/2014  Pregnancy induced hypertension--with 1st pregnancy 12/27/2011   History of irregular menstrual bleeding 11/12/2011   History of eating disorder 11/12/2011   History of cardiac arrhythmia - PAC's - cardiac work-up normal 11/12/2011    REFERRING DIAG: right breast cancer at risk for lymphedema  THERAPY DIAG: Aftercare following surgery for neoplasm  Malignant neoplasm of upper-outer quadrant  of right breast in female, estrogen receptor positive (HCC)  PERTINENT HISTORY: Patient was diagnosed on 06/01/2021 with right grade III invasive ductal carcinoma breast cancer. She underwent neoadjuvant chemotherapy and then had bilateral mastectomies with a right sentinel node biopsy (4 negative nodes) on 12/07/2021. 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   PRECAUTIONS: right UE Lymphedema risk, None  SUBJECTIVE: Pt returns for her 3 month L-Dex screen .   PAIN:  Are you having pain? No  SOZO SCREENING: Patient was assessed today using the SOZO machine to determine the lymphedema index score. This was compared to her baseline score. It was determined that she is within the recommended range when compared to her baseline and no further action is needed at this time. She will continue SOZO screenings. These are done every 3 months for 2 years post operatively followed by every 6 months for 2 years, and then annually.  Educated pt to resume HEP that she has been nervous about since implant exchange. She also knows she can reach out to her doctor for physical therapy if her tightness doesn't resolve with HEP stretching.   L-DEX FLOWSHEETS - 04/04/23 0900       L-DEX LYMPHEDEMA SCREENING   Measurement Type Unilateral    L-DEX MEASUREMENT EXTREMITY Upper Extremity    POSITION  Standing    DOMINANT SIDE Right    At Risk Side Right    BASELINE SCORE (UNILATERAL) -0.7    L-DEX SCORE (UNILATERAL) -5.1    VALUE CHANGE (UNILAT) -4.4              Stayce Delancy R, PT 04/04/2023, 9:09 AM

## 2023-04-10 NOTE — Progress Notes (Signed)
Patient Care Team: Tally Joe, MD as PCP - General (Family Medicine) Lonie Peak, MD as Attending Physician (Radiation Oncology) Essie Hart, MD (Inactive) as Referring Physician (Obstetrics and Gynecology) Serena Croissant, MD as Consulting Physician (Hematology and Oncology) Emelia Loron, MD as Consulting Physician (General Surgery) Glenna Fellows, MD as Consulting Physician (Plastic Surgery)  DIAGNOSIS: No diagnosis found.  SUMMARY OF ONCOLOGIC HISTORY: Oncology History  Malignant neoplasm of upper-outer quadrant of right breast in female, estrogen receptor positive (HCC)  06/04/2021 Initial Diagnosis   T2 N0 IDC, grade 3, ER 15% weak to moderate staining, PR 15% weak to moderate staining (functionally triple negative with HER2 not amplified), with a Ki-67 of 70% (breast MRI 3.1 cm mass)   06/10/2021 Cancer Staging   Staging form: Breast, AJCC 8th Edition - Clinical stage from 06/10/2021: Stage IIA (cT2, cN0, cM0, G3, ER+, PR+, HER2-) - Signed by Lowella Dell, MD on 06/10/2021 Stage prefix: Initial diagnosis Histologic grading system: 3 grade system Laterality: Right Staged by: Pathologist and managing physician Stage used in treatment planning: Yes National guidelines used in treatment planning: Yes Type of national guideline used in treatment planning: NCCN   06/16/2021 Genetic Testing   [facility] CancerNext-Expanded was negative. No pathogenic variants were identified. 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 Report date is 06/19/2021.  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 Report date is May 2023.  The CancerNext-Expanded gene panel offered by [facility] and includes sequencing, rearrangement, and RNA analysis for the following 77 genes: AIP, ALK, APC, ATM, AXIN2, BAP1, BARD1, BLM, BMPR1A, BRCA1, BRCA2, BRIP1, CDC73, CDH1, CDK4, CDKN1B, CDKN2A, CHEK2, CTNNA1, DICER1, FANCC, FH, FLCN, GALNT12, KIF1B, LZTR1,  MAX, MEN1, MET, MLH1, MSH2, MSH3, MSH6, MUTYH, NBN, NF1, NF2, NTHL1, PALB2, PHOX2B, PMS2, POT1, PRKAR1A, PTCH1, PTEN, RAD51C, RAD51D, RB1, RECQL, RET, SDHA, SDHAF2, SDHB, SDHC, SDHD, SMAD4, SMARCA4, SMARCB1, SMARCE1, STK11, SUFU, TMEM127, TP53, TSC1, TSC2, VHL and XRCC2 (sequencing and deletion/duplication); EGFR, EGLN1, HOXB13, KIT, MITF, PDGFRA, POLD1, and POLE (sequencing only); EPCAM and GREM1 (deletion/duplication only).    06/25/2021 -  Neo-Adjuvant Chemotherapy   neoadjuvant chemotherapy consisting of doxorubicin and cyclophosphamide in dose dense fashion x4 started 0/13/2022, completed 08/10/2021, to be followed by weekly carboplatin and paclitaxel x12 starting 08/24/2021   12/07/2021 Surgery   Left mastectomy: Benign Right mastectomy: Negative for residual cancer, 0/4 lymph nodes negative pathologic complete response,   01/11/2022 -  Anti-estrogen oral therapy   Tamoxifen x 10 years   01/20/2022 - 03/01/2022 Radiation Therapy   Site Technique Total Dose (Gy) Dose per Fx (Gy) Completed Fx Beam Energies  Chest Wall, Right: CW_R 3D 50.4/50.4 1.8 28/28 6XFFF       CHIEF COMPLIANT: Follow-up Tamoxifen  INTERVAL HISTORY: Ebony Lamb is a 47 y.o with the above mention follow-up to on tamoxifen. She presents to the clinic for a follow-up.   ALLERGIES:  is allergic to cefepime.  MEDICATIONS:  Current Outpatient Medications  Medication Sig Dispense Refill   acetaminophen (TYLENOL) 500 MG tablet Take by mouth.     calcium carbonate (OS-CAL) 600 MG TABS tablet Take by mouth.     cholecalciferol (VITAMIN D3) 25 MCG (1000 UNIT) tablet Take 1,000 Units by mouth daily.     loratadine (CLARITIN) 10 MG tablet Take 10 mg by mouth as needed for allergies.     magnesium (MAGTAB) 84 MG ( ) TBCR SR tablet Take by mouth.     tamoxifen (NOLVADEX) 20 MG tablet TAKE  1 TABLET(20 MG) BY MOUTH DAILY 90 tablet 3   No current facility-administered medications for this visit.    PHYSICAL  EXAMINATION: ECOG PERFORMANCE STATUS: {CHL ONC ECOG PS:[phone]}  There were no vitals filed for this visit. There were no vitals filed for this visit.  BREAST:*** No palpable masses or nodules in either right or left breasts. No palpable axillary supraclavicular or infraclavicular adenopathy no breast tenderness or nipple discharge. (exam performed in the presence of a chaperone)  LABORATORY DATA:  I have reviewed the data as listed    Latest Ref Rng & Units 03/04/2023    2:17 PM 11/13/2021    8:15 AM 11/06/2021    9:10 AM  CMP  Glucose 70 - 99 mg/dL 88  169  678   BUN 6 - 20 mg/dL 12  10  8    Creatinine 0.44 - 1.00 mg/dL 9.38  1.01  7.51   Sodium 135 - 145 mmol/L 139  140  140   Potassium 3.5 - 5.1 mmol/L 3.5  3.9  3.6   Chloride 98 - 111 mmol/L 105  108  108   CO2 22 - 32 mmol/L 24  28  27    Calcium 8.9 - 10.3 mg/dL 9.0  9.5  9.4   Total Protein 6.5 - 8.1 g/dL  6.6  6.5   Total Bilirubin 0.3 - 1.2 mg/dL  0.4  0.4   Alkaline Phos 38 - 126 U/L  81  71   AST 15 - 41 U/L  27  30   ALT 0 - 44 U/L  32  31     Lab Results  Component Value Date   WBC 7.0 03/04/2023   HGB 13.3 03/04/2023   HCT 38.6 03/04/2023   MCV 93.2 03/04/2023   PLT 209 03/04/2023   NEUTROABS 4.3 03/04/2023    ASSESSMENT & PLAN:  No problem-specific Assessment & Plan notes found for this encounter.    No orders of the defined types were placed in this encounter.  The patient has a good understanding of the overall plan. she agrees with it. she will call with any problems that may develop before the next visit here. Total time spent: 30 mins including face to face time and time spent for planning, charting and co-ordination of care   Sherlyn Lick, CMA 04/10/23    I Janan Ridge am acting as a Neurosurgeon for [employer]  ***

## 2023-04-11 ENCOUNTER — Other Ambulatory Visit: Payer: Self-pay

## 2023-04-11 DIAGNOSIS — Z170 Estrogen receptor positive status [ER+]: Secondary | ICD-10-CM

## 2023-04-12 ENCOUNTER — Telehealth: Payer: Self-pay | Admitting: Radiology

## 2023-04-12 ENCOUNTER — Other Ambulatory Visit: Payer: Self-pay

## 2023-04-12 ENCOUNTER — Inpatient Hospital Stay (HOSPITAL_BASED_OUTPATIENT_CLINIC_OR_DEPARTMENT_OTHER): Payer: No Typology Code available for payment source | Admitting: Hematology and Oncology

## 2023-04-12 ENCOUNTER — Inpatient Hospital Stay: Payer: No Typology Code available for payment source | Attending: Adult Health

## 2023-04-12 VITALS — BP 117/76 | HR 63 | Temp 97.2°F | Resp 18 | Ht 66.0 in | Wt 131.5 lb

## 2023-04-12 DIAGNOSIS — R918 Other nonspecific abnormal finding of lung field: Secondary | ICD-10-CM | POA: Diagnosis not present

## 2023-04-12 DIAGNOSIS — R232 Flushing: Secondary | ICD-10-CM | POA: Diagnosis not present

## 2023-04-12 DIAGNOSIS — Z170 Estrogen receptor positive status [ER+]: Secondary | ICD-10-CM

## 2023-04-12 DIAGNOSIS — C50411 Malignant neoplasm of upper-outer quadrant of right female breast: Secondary | ICD-10-CM | POA: Insufficient documentation

## 2023-04-12 DIAGNOSIS — Z780 Asymptomatic menopausal state: Secondary | ICD-10-CM

## 2023-04-12 DIAGNOSIS — Z9221 Personal history of antineoplastic chemotherapy: Secondary | ICD-10-CM | POA: Diagnosis not present

## 2023-04-12 DIAGNOSIS — M810 Age-related osteoporosis without current pathological fracture: Secondary | ICD-10-CM | POA: Diagnosis not present

## 2023-04-12 DIAGNOSIS — Z79810 Long term (current) use of selective estrogen receptor modulators (SERMs): Secondary | ICD-10-CM | POA: Diagnosis not present

## 2023-04-12 DIAGNOSIS — Z9013 Acquired absence of bilateral breasts and nipples: Secondary | ICD-10-CM | POA: Diagnosis not present

## 2023-04-12 DIAGNOSIS — Z923 Personal history of irradiation: Secondary | ICD-10-CM | POA: Diagnosis not present

## 2023-04-12 LAB — CMP (CANCER CENTER ONLY)
ALT: 9 U/L (ref 0–44)
AST: 15 U/L (ref 15–41)
Albumin: 4 g/dL (ref 3.5–5.0)
Alkaline Phosphatase: 56 U/L (ref 38–126)
Anion gap: 5 (ref 5–15)
BUN: 18 mg/dL (ref 6–20)
CO2: 27 mmol/L (ref 22–32)
Calcium: 9.2 mg/dL (ref 8.9–10.3)
Chloride: 109 mmol/L (ref 98–111)
Creatinine: 0.8 mg/dL (ref 0.44–1.00)
GFR, Estimated: >60 mL/min (ref >60)
Glucose, Bld: 85 mg/dL (ref 70–99)
Potassium: 4.1 mmol/L (ref 3.5–5.1)
Sodium: 141 mmol/L (ref 135–145)
Total Bilirubin: 0.2 mg/dL — ABNORMAL LOW (ref 0.3–1.2)
Total Protein: 6.7 g/dL (ref 6.5–8.1)

## 2023-04-12 LAB — CBC WITH DIFFERENTIAL (CANCER CENTER ONLY)
Abs Immature Granulocytes: 0.01 10*3/uL (ref 0.00–0.07)
Basophils Absolute: 0 10*3/uL (ref 0.0–0.1)
Basophils Relative: 1 %
Eosinophils Absolute: 0.3 10*3/uL (ref 0.0–0.5)
Eosinophils Relative: 4 %
HCT: 42 % (ref 36.0–46.0)
Hemoglobin: 12.8 g/dL (ref 12.0–15.0)
Immature Granulocytes: 0 %
Lymphocytes Relative: 42 %
Lymphs Abs: 2.4 10*3/uL (ref 0.7–4.0)
MCH: 33.2 pg (ref 26.0–34.0)
MCHC: 30.5 g/dL (ref 30.0–36.0)
MCV: 109.1 fL — ABNORMAL HIGH (ref 80.0–100.0)
Monocytes Absolute: 0.4 10*3/uL (ref 0.1–1.0)
Monocytes Relative: 7 %
Neutro Abs: 2.6 10*3/uL (ref 1.7–7.7)
Neutrophils Relative %: 46 %
Platelet Count: 157 10*3/uL (ref 150–400)
RBC: 3.85 MIL/uL — ABNORMAL LOW (ref 3.87–5.11)
RDW: 12.7 % (ref 11.5–15.5)
WBC Count: 5.7 10*3/uL (ref 4.0–10.5)
nRBC: 0 % (ref 0.0–0.2)

## 2023-04-12 NOTE — Telephone Encounter (Signed)
OPTIMIZING PSYCHOSOCIAL INTERVENTION FOR BREAST CANCER-RELATED SEXUAL MORBIDITY: THE SEXUAL HEALTH AND INTIMACY EDUCATION (SHINE) TRIAL   04/12/23  INITIAL PHONE CALL: Confirmed I was speaking with Ebony Lamb. Introduced myself as Marketing executive for the above mentioned trial. Patient reached out about interest in the trial and this coordinator reviewed medical eligibility. Based on medical review, patient had Stage IIA breast cancer and completed treatment in 2023. This coordinator received verbal consent from patient to send screener questionnaire to her preferred e-mail address. Informed patient this is a short questionnaire that will determine full eligibility. Informed patient if she is eligible, the study will send an email to this coordinator and we can pursue the consent if she is interested in participating. This coordinator sent a informed consent document via e-mail for review. Thanked patient for her time and consideration of the above mentioned study.   Merri Brunette, RT(R)(T) Clinical Research Coordinator

## 2023-04-12 NOTE — Assessment & Plan Note (Signed)
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 Neo adj chemo with AC X 4 followed by Taxol and Carboplatin weekly X 12 2. bilateral mastectomies with reconstruction 12/07/2021: Pathologic complete response in the right breast 0/4 lymph nodes negative 3.  Adjuvant radiation completed 03/01/2022 4. Adj Anti estrogen therapy with tamoxifen for 10 yrs (started 10 mg daily on 01/11/2022:  Switched to Anastrozole 10/05/22: Patient was in menopause by lab work, switched back to tamoxifen ------------------------------------------------------------------------------------------------------------------- Tamoxifen toxicities:  Osteoporosis: Bone density 10/05/2022: T-score -2.9 recommended bisphosphonate therapy  Lung nodules: CT chest 09/30/2022: No evidence of metastatic disease in the chest.  Previously described right middle lobe nodules are stable and considered benign.  Postradiation changes right lung   Breast cancer surveillance: Breast exam 04/12/2023: Benign No role of imaging since she had bilateral mastectomies.  Return to clinic in 1 year for follow-up

## 2023-04-13 ENCOUNTER — Ambulatory Visit: Payer: No Typology Code available for payment source | Admitting: Cardiology

## 2023-04-15 ENCOUNTER — Telehealth: Payer: Self-pay | Admitting: Radiology

## 2023-04-15 NOTE — Telephone Encounter (Signed)
OPTIMIZING PSYCHOSOCIAL INTERVENTION FOR BREAST CANCER-RELATED SEXUAL MORBIDITY: THE SEXUAL HEALTH AND INTIMACY EDUCATION (SHINE) TRIAL   04/15/23  PHONE CALL: Confirmed I was speaking with Ebony Lamb. Informed patient reason for call is to follow-up on the above mentioned study. Patient confirmed she has received the e-mail, but has not had a chance to complete the survey yet. This coordinator expressed understanding. Thanked patient for her time and consideration of the above mentioned study.   Merri Brunette, RT(R)(T) Clinical Research Coordinator

## 2023-05-03 LAB — SIGNATERA: SIGNATERA MTM READOUT: 0 MTM/ml

## 2023-05-04 ENCOUNTER — Ambulatory Visit: Payer: No Typology Code available for payment source | Admitting: Cardiology

## 2023-05-11 ENCOUNTER — Other Ambulatory Visit: Payer: No Typology Code available for payment source

## 2023-05-11 ENCOUNTER — Encounter: Payer: Self-pay | Admitting: Cardiology

## 2023-05-11 ENCOUNTER — Ambulatory Visit: Payer: No Typology Code available for payment source | Admitting: Cardiology

## 2023-05-11 VITALS — BP 120/78 | HR 74 | Resp 16 | Ht 66.0 in | Wt 131.0 lb

## 2023-05-11 DIAGNOSIS — R002 Palpitations: Secondary | ICD-10-CM

## 2023-05-11 DIAGNOSIS — R072 Precordial pain: Secondary | ICD-10-CM | POA: Insufficient documentation

## 2023-05-11 NOTE — Progress Notes (Signed)
Patient referred by Tally Joe, MD for palpitations  Subjective:   Ebony Lamb, female    DOB: [birth year], 47 y.o.   MRN: [id number]   Chief Complaint  Patient presents with   Palpitations   New Patient (Initial Visit)    Referred by Dr. Wynelle Link     HPI  47 y.o. Caucasian female with h.o breast cancer s/p b/l mastectomy, chemo+radiation, now with palpitations  Patient is trying to increase her physical activity, exercises with running 15-20 min regularly. She has had episodes of palpitations in the morning, lasting for a few seconds, occasionally associated with chest pain. She denies any exertional chest pain or shortness of breath. She also denies any presyncope or syncope.   Past Medical History:  Diagnosis Date   Abnormal Pap smear 09/13/2000   Allergy 02/2020   Seasonal   Breast cancer (HCC)    Depression    history of depression- denies currently   Hypertension    Port-A-Cath in place 08/10/2021   Preterm labor      Past Surgical History:  Procedure Laterality Date   BREAST BIOPSY Right 07/27/2019   BREAST RECONSTRUCTION Right 07/27/2022   Procedure: RIGHT NIPPLE REDUCTION;  Surgeon: Glenna Fellows, MD;  Location: [facility];  Service: Plastics;  Laterality: Right;   BREAST RECONSTRUCTION WITH PLACEMENT OF TISSUE EXPANDER AND ALLODERM Bilateral 12/07/2021   Procedure: BREAST RECONSTRUCTION WITH PLACEMENT OF TISSUE EXPANDER AND ALLODERM;  Surgeon: Glenna Fellows, MD;  Location: [facility];  Service: Plastics;  Laterality: Bilateral;   CESAREAN SECTION  11/14/2011   Procedure: CESAREAN SECTION;  Surgeon: Janine Limbo, MD;  Location: [facility];  Service: Gynecology;  Laterality: N/A;  Primary cesarean section with delivery of baby boy at [phone]. Apgars 9/9.   CESAREAN SECTION N/A 03/22/2014   Procedure: Repeat CESAREAN SECTION;  Surgeon: Esmeralda Arthur, MD;  Location: [facility];  Service: Obstetrics;  Laterality: N/A;   COSMETIC  SURGERY  11/22/2017   Abdominoplasty   FOOT SURGERY     FRACTURE SURGERY  08/1999   Repair of 5th metatarsal in left foot   HERNIA REPAIR  11/22/2017   NIPPLE SPARING MASTECTOMY Left 12/07/2021   Procedure: LEFT NIPPLE SPARING MASTECTOMY;  Surgeon: Emelia Loron, MD;  Location: [facility];  Service: General;  Laterality: Left;   NIPPLE SPARING MASTECTOMY WITH SENTINEL LYMPH NODE BIOPSY Right 12/07/2021   Procedure: RIGHT NIPPLE SPARING MASTECTOMY WITH AXILLARY SENTINEL LYMPH NODE BIOPSY;  Surgeon: Emelia Loron, MD;  Location: [facility];  Service: General;  Laterality: Right;   PORTACATH PLACEMENT N/A 06/24/2021   Procedure: INSERTION PORT-A-CATH;  Surgeon: Abigail Miyamoto, MD;  Location: [facility];  Service: General;  Laterality: N/A;   REMOVAL OF BILATERAL TISSUE EXPANDERS WITH PLACEMENT OF BILATERAL BREAST IMPLANTS Bilateral 07/27/2022   Procedure: REMOVAL OF BILATERAL TISSUE EXPANDERS WITH PLACEMENT OF BILATERAL BREAST IMPLANTS;  Surgeon: Glenna Fellows, MD;  Location: [facility];  Service: Plastics;  Laterality: Bilateral;   TUBAL LIGATION  11/22/2017   Tubes were completely removed   WISDOM TOOTH EXTRACTION       Social History   Tobacco Use  Smoking Status Never  Smokeless Tobacco Never    Social History   Substance and Sexual Activity  Alcohol Use Not Currently   Alcohol/week: 7.0 standard drinks of alcohol   Types: 7 Standard drinks or equivalent per week   Comment: I stopped drinking alcohol after finding the lump  in my brea     Family History  Problem Relation Age of Onset   Breast cancer Mother 12   Osteopenia Mother    Arthritis Mother    Other Mother        BRCA2 gene mutation   Ulcerative colitis Mother    Hypertension Father    Kidney disease Father    Depression Father    Hyperlipidemia Father    Melanoma Father 67   Hypertension Brother    Hyperlipidemia Brother    Breast  cancer Maternal Grandmother        dx. late 30s/40s   Heart disease Maternal Grandfather    Other Maternal Grandfather        BRCA2 gene mutation   Ovarian cancer Paternal Grandmother        dx. 40s   Stroke Paternal Grandfather    Birth defects Son    Anesthesia problems Neg Hx    Diabetes Neg Hx       Current Outpatient Medications:    acetaminophen (TYLENOL) 500 MG tablet, Take by mouth., Disp: , Rfl:    calcium carbonate (OS-CAL) 600 MG TABS tablet, Take by mouth., Disp: , Rfl:    cholecalciferol (VITAMIN D3) 25 MCG (1000 UNIT) tablet, Take 1,000 Units by mouth daily., Disp: , Rfl:    loratadine (CLARITIN) 5 MG chewable tablet, , Disp: , Rfl:    magnesium (MAGTAB) 84 MG ( ) TBCR SR tablet, Take by mouth., Disp: , Rfl:    tamoxifen (NOLVADEX) 20 MG tablet, TAKE 1 TABLET(20 MG) BY MOUTH DAILY, Disp: 90 tablet, Rfl: 3   Cardiovascular and other pertinent studies:  Reviewed external labs and tests, independently interpreted  EKG 05/11/2023: Sinus rhythm 73 bpm  RSR(V1) -nondiagnostic Left atrial enlargement  CT chest w/contrast 09/30/2022: 1. No evidence of metastatic disease in the chest. Previously described tiny right middle lobe nodules are stable and considered benign. 2. Evolving postradiation change at the periphery of the anterior right mid lung.   Echocardiogram 06/22/2021: 1. Left ventricular ejection fraction, by estimation, is 55 to 60%. The  left ventricle has normal function. The left ventricle has no regional  wall motion abnormalities. Left ventricular diastolic parameters were  normal. The average left ventricular  global longitudinal strain is -23.0 %. The global longitudinal strain is  normal.   2. Right ventricular systolic function is normal. The right ventricular  size is normal.   3. The mitral valve is normal in structure. No evidence of mitral valve  regurgitation. No evidence of mitral stenosis.   4. The aortic valve is tricuspid. Aortic  valve regurgitation is not  visualized. No aortic stenosis is present.   5. The inferior vena cava is normal in size with greater than 50%  respiratory variability, suggesting right atrial pressure of 3 mmHg.   CTA chest 2019: 1. No acute pulmonary embolism. Presumed papillary muscular hypertrophy and motion artifact, less likely thrombus LEFT ventricle which could be excluded with echocardiogram. 2. Mild cardiomegaly.  No acute pulmonary process.   Recent labs: 04/12/2023: Glucose 85, BUN/Cr 18/0.8. EGFR >60. Na/K 141/4.1. Rest of the CMP normal H/H 12/42. MCV 109. Platelets 157  01/11/2023: Glucose 87, BUN/Cr 9/0.74. EGFR 101. Na/K 140/4.1. Rest of the CMP normal H/H 13/41. MCV 95. Platelets 206 HbA1C NA    Review of Systems  Cardiovascular:  Positive for chest pain and palpitations. Negative for dyspnea on exertion, leg swelling and syncope.         Vitals:  05/11/23 1322  BP: 120/78  Pulse: 74  Resp: 16  SpO2: 100%     Body mass index is 21.14 kg/m. Filed Weights   05/11/23 1322  Weight: 131 lb (59.4 kg)     Objective:   Physical Exam Vitals and nursing note reviewed.  Constitutional:      General: She is not in acute distress. Neck:     Vascular: No JVD.  Cardiovascular:     Rate and Rhythm: Normal rate and regular rhythm.     Heart sounds: Normal heart sounds. No murmur heard. Pulmonary:     Effort: Pulmonary effort is normal.     Breath sounds: Normal breath sounds. No wheezing or rales.  Musculoskeletal:     Right lower leg: No edema.     Left lower leg: No edema.          Visit diagnoses:   ICD-10-CM   1. Palpitations  R00.2 EKG 12-Lead    LONG TERM MONITOR (3-14 DAYS)    2. Precordial pain  R07.2 PCV ECHOCARDIOGRAM COMPLETE    PCV CARDIAC STRESS TEST       Orders Placed This Encounter  Procedures   LONG TERM MONITOR (3-14 DAYS)   PCV CARDIAC STRESS TEST   EKG 12-Lead   PCV ECHOCARDIOGRAM COMPLETE      Assessment &  Recommendations:   47 y.o. Caucasian female with h.o breast cancer s/p b/l mastectomy, chemo+radiation, now with palpitations  Chest pain, palpitations: Chest pain appears non anginal. Recommend echocardiogram, exercise treadmill stress test, 1 week cardiac telemetry (since patient has symptoms daily). Suspect symptoms are benign AC/PVC. If any other significant abnormalities found, I will see her follow up. Otherwise, I will see her as needed.   Thank you for referring the patient to [name]. Please feel free to contact with any questions.   Elder Negus, MD Pager: [phone] Office: [phone]

## 2023-05-17 ENCOUNTER — Telehealth: Payer: Self-pay

## 2023-05-17 NOTE — Telephone Encounter (Signed)
Attempted to call pt regarding signatera results lvm for pt to return call back to receive results. Results was negative.

## 2023-05-19 ENCOUNTER — Ambulatory Visit: Payer: No Typology Code available for payment source

## 2023-05-19 DIAGNOSIS — R072 Precordial pain: Secondary | ICD-10-CM

## 2023-05-20 ENCOUNTER — Ambulatory Visit: Payer: No Typology Code available for payment source

## 2023-05-20 DIAGNOSIS — R072 Precordial pain: Secondary | ICD-10-CM

## 2023-07-11 ENCOUNTER — Ambulatory Visit: Payer: No Typology Code available for payment source | Attending: Surgery

## 2023-07-11 VITALS — Wt 132.2 lb

## 2023-07-11 DIAGNOSIS — Z483 Aftercare following surgery for neoplasm: Secondary | ICD-10-CM | POA: Insufficient documentation

## 2023-07-11 NOTE — Therapy (Signed)
OUTPATIENT PHYSICAL THERAPY SOZO SCREENING NOTE   Patient Name: Ebony Lamb MRN: [id number] DOB:02-28-1976, 47 y.o., female Today's Date: 07/11/2023  PCP: Tally Joe, MD REFERRING PROVIDER: Abigail Miyamoto, MD   PT End of Session - 07/11/23 [phone]     Visit Number 13   # unchanged due to screen only   PT Start Time 0916    PT Stop Time 0920    PT Time Calculation (min) 4 min    Activity Tolerance Patient tolerated treatment well    Behavior During Therapy [facility] for tasks assessed/performed             Past Medical History:  Diagnosis Date   Abnormal Pap smear 09/13/2000   Allergy 02/2020   Seasonal   Breast cancer (HCC)    Depression    history of depression- denies currently   Hypertension    Port-A-Cath in place 08/10/2021   Preterm labor    Past Surgical History:  Procedure Laterality Date   BREAST BIOPSY Right 07/27/2019   BREAST RECONSTRUCTION Right 07/27/2022   Procedure: RIGHT NIPPLE REDUCTION;  Surgeon: Glenna Fellows, MD;  Location: [facility];  Service: Plastics;  Laterality: Right;   BREAST RECONSTRUCTION WITH PLACEMENT OF TISSUE EXPANDER AND ALLODERM Bilateral 12/07/2021   Procedure: BREAST RECONSTRUCTION WITH PLACEMENT OF TISSUE EXPANDER AND ALLODERM;  Surgeon: Glenna Fellows, MD;  Location: [facility];  Service: Plastics;  Laterality: Bilateral;   CESAREAN SECTION  11/14/2011   Procedure: CESAREAN SECTION;  Surgeon: Janine Limbo, MD;  Location: [facility];  Service: Gynecology;  Laterality: N/A;  Primary cesarean section with delivery of baby boy at [phone]. Apgars 9/9.   CESAREAN SECTION N/A 03/22/2014   Procedure: Repeat CESAREAN SECTION;  Surgeon: Esmeralda Arthur, MD;  Location: [facility];  Service: Obstetrics;  Laterality: N/A;   COSMETIC SURGERY  11/22/2017   Abdominoplasty   FOOT SURGERY     FRACTURE SURGERY  08/1999   Repair of 5th metatarsal in left foot   HERNIA REPAIR  11/22/2017   NIPPLE SPARING MASTECTOMY  Left 12/07/2021   Procedure: LEFT NIPPLE SPARING MASTECTOMY;  Surgeon: Emelia Loron, MD;  Location: [facility];  Service: General;  Laterality: Left;   NIPPLE SPARING MASTECTOMY WITH SENTINEL LYMPH NODE BIOPSY Right 12/07/2021   Procedure: RIGHT NIPPLE SPARING MASTECTOMY WITH AXILLARY SENTINEL LYMPH NODE BIOPSY;  Surgeon: Emelia Loron, MD;  Location: [facility];  Service: General;  Laterality: Right;   PORTACATH PLACEMENT N/A 06/24/2021   Procedure: INSERTION PORT-A-CATH;  Surgeon: Abigail Miyamoto, MD;  Location: [facility];  Service: General;  Laterality: N/A;   REMOVAL OF BILATERAL TISSUE EXPANDERS WITH PLACEMENT OF BILATERAL BREAST IMPLANTS Bilateral 07/27/2022   Procedure: REMOVAL OF BILATERAL TISSUE EXPANDERS WITH PLACEMENT OF BILATERAL BREAST IMPLANTS;  Surgeon: Glenna Fellows, MD;  Location: [facility];  Service: Plastics;  Laterality: Bilateral;   TUBAL LIGATION  11/22/2017   Tubes were completely removed   WISDOM TOOTH EXTRACTION     Patient Active Problem List   Diagnosis Date Noted   Palpitations 05/11/2023   Precordial pain 05/11/2023   Osteoporosis 10/11/2022   Vitamin D deficiency 10/11/2022   Genetic testing 06/17/2021   Family history of breast cancer 06/10/2021   Family history of ovarian cancer 06/10/2021   Family history of BRCA gene mutation 06/10/2021   Malignant neoplasm of upper-outer quadrant of right breast in female, estrogen receptor positive (HCC) 06/08/2021   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 She underwent neoadjuvant chemotherapy and then had bilateral mastectomies with a right sentinel node biopsy (4 negative nodes) on 12/07/2021. 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   PRECAUTIONS: right UE Lymphedema risk, None  SUBJECTIVE: Pt returns for her 3 month L-Dex screen .   PAIN:  Are you having pain? No  SOZO SCREENING: Patient was assessed today using the SOZO machine to determine the lymphedema index score. This was compared to her baseline score. It was determined that she is within the recommended range when compared to her baseline and no further action is needed at this time. She will continue SOZO screenings. These are done every 3 months for 2 years post operatively followed by every 6 months for 2 years, and then annually.  Educated pt to resume HEP that she has been nervous about since implant exchange. She also knows she can reach out to her doctor for physical therapy if her tightness doesn't resolve with HEP stretching.   L-DEX FLOWSHEETS - 07/11/23 0900       L-DEX LYMPHEDEMA SCREENING   Measurement Type Unilateral    L-DEX MEASUREMENT EXTREMITY Upper Extremity    POSITION  Standing    DOMINANT SIDE Right    At Risk Side Right    BASELINE SCORE (UNILATERAL) -0.7    L-DEX SCORE (UNILATERAL) -5.7    VALUE CHANGE (UNILAT) -5              Hermenia Bers, PTA 07/11/2023, 9:19 AM

## 2023-07-20 LAB — SIGNATERA
SIGNATERA MTM READOUT: 0 MTM/ml
SIGNATERA TEST RESULT: NEGATIVE

## 2023-07-23 ENCOUNTER — Encounter: Payer: Self-pay | Admitting: Hematology and Oncology

## 2023-08-30 ENCOUNTER — Other Ambulatory Visit: Payer: Self-pay | Admitting: *Deleted

## 2023-08-30 DIAGNOSIS — R051 Acute cough: Secondary | ICD-10-CM

## 2023-08-30 DIAGNOSIS — Z170 Estrogen receptor positive status [ER+]: Secondary | ICD-10-CM

## 2023-08-30 DIAGNOSIS — M545 Low back pain, unspecified: Secondary | ICD-10-CM

## 2023-08-30 NOTE — Progress Notes (Unsigned)
Symptom Management Consult Note [facility]    Patient Care Team: Tally Joe, MD as PCP - General (Family Medicine) Lonie Peak, MD as Attending Physician (Radiation Oncology) Essie Hart, MD (Inactive) as Referring Physician (Obstetrics and Gynecology) Serena Croissant, MD as Consulting Physician (Hematology and Oncology) Emelia Loron, MD as Consulting Physician (General Surgery) Glenna Fellows, MD as Consulting Physician (Plastic Surgery)    Name / MRN / DOB: Ebony Lamb  [id number]  01-23-1976   Date of visit: 08/31/2023   Chief Complaint/Reason for visit: back pain and cough   Current Therapy: Tamoxifen    ASSESSMENT & PLAN: Patient is a 47 y.o. female with oncologic history of malignant neoplasm of upper-outer quadrant of right breast in female, estrogen receptor positive followed by Dr. Pamelia Hoit.  I have viewed most recent oncology note and lab work.    #Malignant neoplasm of upper-outer quadrant of right breast in female, estrogen receptor positive  - Chart review shows Signatera testing was negative x 1 month ago. - Next appointment with oncologist is 09/26/23   #Back pain  -Not reproducible on exam. Normal ROM and neuro exam -Plain film of lumbar spine is unremarkable. Reviewed this with patient. - Continue Aleve and Tylenol for pain management - Gradual return to exercise, - Engaged in shared decision making with patient regarding further work up with MRI vs bone scan. She prefers to closely monitor symptoms and if no improvement she would be agreeable to a scan.  #Cough -Cough persisting for a week and a half, likely viral. No fever, occasional productive cough, no significant shortness of breath. Chest x-ray shows no acute infectious process. Cough may be contributing to back pain.  - Prescribe Tessalon Perles   Strict ED precautions discussed should symptoms worsen.   Heme/Onc History: Oncology History  Malignant neoplasm of  upper-outer quadrant of right breast in female, estrogen receptor positive (HCC)  06/04/2021 Initial Diagnosis   T2 N0 IDC, grade 3, ER 15% weak to moderate staining, PR 15% weak to moderate staining (functionally triple negative with HER2 not amplified), with a Ki-67 of 70% (breast MRI 3.1 cm mass)   06/10/2021 Cancer Staging   Staging form: Breast, AJCC 8th Edition - Clinical stage from 06/10/2021: Stage IIA (cT2, cN0, cM0, G3, ER+, PR+, HER2-) - Signed by Lowella Dell, MD on 06/10/2021 Stage prefix: Initial diagnosis Histologic grading system: 3 grade system Laterality: Right Staged by: Pathologist and managing physician Stage used in treatment planning: Yes National guidelines used in treatment planning: Yes Type of national guideline used in treatment planning: NCCN   06/16/2021 Genetic Testing   [facility] CancerNext-Expanded was negative. No pathogenic variants were identified. 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 Report date is 06/19/2021.  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 Report date is May 2023.  The CancerNext-Expanded gene panel offered by [facility] and includes sequencing, rearrangement, and RNA analysis for the following 77 genes: AIP, ALK, APC, ATM, AXIN2, BAP1, BARD1, BLM, BMPR1A, BRCA1, BRCA2, BRIP1, CDC73, CDH1, CDK4, CDKN1B, CDKN2A, CHEK2, CTNNA1, DICER1, FANCC, FH, FLCN, GALNT12, KIF1B, LZTR1, MAX, MEN1, MET, MLH1, MSH2, MSH3, MSH6, MUTYH, NBN, NF1, NF2, NTHL1, PALB2, PHOX2B, PMS2, POT1, PRKAR1A, PTCH1, PTEN, RAD51C, RAD51D, RB1, RECQL, RET, SDHA, SDHAF2, SDHB, SDHC, SDHD, SMAD4, SMARCA4, SMARCB1, SMARCE1, STK11, SUFU, TMEM127, TP53, TSC1, TSC2, VHL and XRCC2 (sequencing and deletion/duplication); EGFR, EGLN1, HOXB13, KIT, MITF, PDGFRA, POLD1, and POLE (sequencing only); EPCAM and GREM1 (deletion/duplication only).  06/25/2021 -  Neo-Adjuvant Chemotherapy   neoadjuvant chemotherapy consisting of doxorubicin  and cyclophosphamide in dose dense fashion x4 started 0/13/2022, completed 08/10/2021, to be followed by weekly carboplatin and paclitaxel x12 starting 08/24/2021   12/07/2021 Surgery   Left mastectomy: Benign Right mastectomy: Negative for residual cancer, 0/4 lymph nodes negative pathologic complete response,   01/11/2022 -  Anti-estrogen oral therapy   Tamoxifen x 10 years   01/20/2022 - 03/01/2022 Radiation Therapy   Site Technique Total Dose (Gy) Dose per Fx (Gy) Completed Fx Beam Energies  Chest Wall, Right: CW_R 3D 50.4/50.4 1.8 28/28 6XFFF         Interval history-: Discussed the use of AI scribe software for clinical note transcription with the patient, who gave verbal consent to proceed.   Ebony Lamb is a 47 y.o. female with oncologic history as above presenting to [facility] today with chief complaint of back pain and cough. Patient presents unaccompanied to visit today.  Patient reports low back pain that started four days ago. The pain was initially localized to the sacroiliac area and extended to the lower back. The patient reported a temporary improvement in symptoms after rest but experienced a sudden increase in pain following a coughing episode x 2 days ago which concerned her. The patient has a history of intermittent sacroiliac joint pain over several years but described the current episode as different and more severe. The patient's back pain was described as an ache, currently rating 3 out of 10 in severity. The pain was noted to increase with certain movements such as bending over, twisting, or leaning to the side. she denies any fever, chills, urinary symptoms.  The patient also reported a persistent cough for approximately a week and a half. The cough was described as productive at times, with a sensation of 'rattling around.'  Her son also has a cough and was evaluated at the pediatrician's office with a "negative work up."  The patient has been managing the pain with Aleve  and rest and reported a slight improvement in symptoms at the time of the consultation. The patient has a history of physical activity, including running and weight training, but had taken a break from these activities due to the cough.   The patient's mother had a history of compression fracture due to osteoporosis, which raised concerns about a similar occurrence. However, the patient reported no weakness in the legs and no pain on palpation of the back.   ROS  All other systems are reviewed and are negative for acute change except as noted in the HPI.    Allergies  Allergen Reactions   Gramineae Pollens Other (See Comments)    "Runny nose" per patient   Cefepime Rash     Past Medical History:  Diagnosis Date   Abnormal Pap smear 09/13/2000   Allergy 02/2020   Seasonal   Breast cancer (HCC)    Depression    history of depression- denies currently   Hypertension    Port-A-Cath in place 08/10/2021   Preterm labor      Past Surgical History:  Procedure Laterality Date   BREAST BIOPSY Right 07/27/2019   BREAST RECONSTRUCTION Right 07/27/2022   Procedure: RIGHT NIPPLE REDUCTION;  Surgeon: Glenna Fellows, MD;  Location: [facility];  Service: Plastics;  Laterality: Right;   BREAST RECONSTRUCTION WITH PLACEMENT OF TISSUE EXPANDER AND ALLODERM Bilateral 12/07/2021   Procedure: BREAST RECONSTRUCTION WITH PLACEMENT OF TISSUE EXPANDER AND ALLODERM;  Surgeon: Glenna Fellows, MD;  Location: [facility];  Service: Plastics;  Laterality: Bilateral;   CESAREAN SECTION  11/14/2011   Procedure: CESAREAN SECTION;  Surgeon: Janine Limbo, MD;  Location: [facility];  Service: Gynecology;  Laterality: N/A;  Primary cesarean section with delivery of baby boy at [phone]. Apgars 9/9.   CESAREAN SECTION N/A 03/22/2014   Procedure: Repeat CESAREAN SECTION;  Surgeon: Esmeralda Arthur, MD;  Location: [facility];  Service: Obstetrics;  Laterality: N/A;   COSMETIC SURGERY   11/22/2017   Abdominoplasty   FOOT SURGERY     FRACTURE SURGERY  08/1999   Repair of 5th metatarsal in left foot   HERNIA REPAIR  11/22/2017   NIPPLE SPARING MASTECTOMY Left 12/07/2021   Procedure: LEFT NIPPLE SPARING MASTECTOMY;  Surgeon: Emelia Loron, MD;  Location: [facility];  Service: General;  Laterality: Left;   NIPPLE SPARING MASTECTOMY WITH SENTINEL LYMPH NODE BIOPSY Right 12/07/2021   Procedure: RIGHT NIPPLE SPARING MASTECTOMY WITH AXILLARY SENTINEL LYMPH NODE BIOPSY;  Surgeon: Emelia Loron, MD;  Location: [facility];  Service: General;  Laterality: Right;   PORTACATH PLACEMENT N/A 06/24/2021   Procedure: INSERTION PORT-A-CATH;  Surgeon: Abigail Miyamoto, MD;  Location: [facility];  Service: General;  Laterality: N/A;   REMOVAL OF BILATERAL TISSUE EXPANDERS WITH PLACEMENT OF BILATERAL BREAST IMPLANTS Bilateral 07/27/2022   Procedure: REMOVAL OF BILATERAL TISSUE EXPANDERS WITH PLACEMENT OF BILATERAL BREAST IMPLANTS;  Surgeon: Glenna Fellows, MD;  [facility];  Service: Plastics;  Laterality: Bilateral;   TUBAL LIGATION  11/22/2017   Tubes were completely removed   WISDOM TOOTH EXTRACTION      Social History   Socioeconomic History   Marital status: Married    Spouse name: Not on file   Number of children: Not on file   Years of education: Not on file   Highest education level: Not on file  Occupational History   Not on file  Tobacco Use   Smoking status: Never   Smokeless tobacco: Never  Vaping Use   Vaping status: Never Used  Substance and Sexual Activity   Alcohol use: Not Currently    Alcohol/week: 7.0 standard drinks of alcohol    Types: 7 Standard drinks or equivalent per week    Comment: I stopped drinking alcohol after finding the lump in my brea   Drug use: No   Sexual activity: Yes    Birth control/protection: Surgical  Other Topics Concern   Not on file  Social History  Narrative   Not on file   Social Drivers of Health   Financial Resource Strain: Low Risk  (12/01/2022)   Received from [facility], [facility]   Overall Financial Resource Strain (CARDIA)    Difficulty of Paying Living Expenses: Not hard at all  Food Insecurity: No Food Insecurity (12/01/2022)   Received from [facility], [facility]   Hunger Vital Sign    Worried About Running Out of Food in the Last Year: Never true    Ran Out of Food in the Last Year: Never true  Transportation Needs: No Transportation Needs (12/01/2022)   Received from [facility], [facility]   PRAPARE - Transportation    Lack of Transportation (Medical): No    Lack of Transportation (Non-Medical): No  Physical Activity: Sufficiently Active (12/01/2022)   Received from [facility], [facility]   Exercise Vital Sign    Days of Exercise per Week: 5 days    Minutes of Exercise  per Session: 30 min  Stress: Stress Concern Present (12/01/2022)   Received from [facility], [facility] - Occupational Stress Questionnaire    Feeling of Stress : To some extent  Social Connections: Socially Integrated (12/01/2022)   Received from [facility], [facility]   Social Network    How would you rate your social network (family, work, friends)?: Good participation with social networks  Intimate Partner Violence: Not At Risk (12/01/2022)   Received from [facility], [facility]   HITS    Over the last 12 months how often did your partner physically hurt you?: Never    Over the last 12 months how often did your partner insult you or talk down to you?: Never    Over the last 12 months how often did your partner threaten you with physical harm?: Never    Over the last 12 months how often did your partner scream or curse at you?: Never    Family History  Problem Relation Age of Onset   Breast cancer Mother 41   Osteopenia Mother    Arthritis Mother    Other  Mother        BRCA2 gene mutation   Ulcerative colitis Mother    Hypertension Father    Kidney disease Father    Depression Father    Hyperlipidemia Father    Melanoma Father 68   Pulmonary fibrosis Father    Hypertension Brother    Hyperlipidemia Brother    Breast cancer Maternal Grandmother        dx. late 30s/40s   Heart disease Maternal Grandfather    Other Maternal Grandfather        BRCA2 gene mutation   Ovarian cancer Paternal Grandmother        dx. 40s   Stroke Paternal Grandfather    Birth defects Son    Anesthesia problems Neg Hx    Diabetes Neg Hx      Current Outpatient Medications:    benzonatate (TESSALON) 100 MG capsule, Take 1 capsule (100 mg total) by mouth 3 (three) times daily as needed for cough., Disp: 20 capsule, Rfl: 0   acetaminophen (TYLENOL) 500 MG tablet, Take by mouth., Disp: , Rfl:    calcium carbonate (OS-CAL) 600 MG TABS tablet, Take by mouth., Disp: , Rfl:    cholecalciferol (VITAMIN D3) 25 MCG (1000 UNIT) tablet, Take 1,000 Units by mouth daily., Disp: , Rfl:    loratadine (CLARITIN) 5 MG chewable tablet, , Disp: , Rfl:    magnesium (MAGTAB) 84 MG ( ) TBCR SR tablet, Take by mouth., Disp: , Rfl:    tamoxifen (NOLVADEX) 20 MG tablet, TAKE 1 TABLET(20 MG) BY MOUTH DAILY, Disp: 90 tablet, Rfl: 3  PHYSICAL EXAM: ECOG FS:1 - Symptomatic but completely ambulatory    Vitals:   08/31/23 0856  BP: (!) 121/90  Pulse: 62  Resp: 18  Temp: 98.2 F (36.8 C)  TempSrc: Oral  SpO2: 100%  Weight: 137 lb 9.6 oz (62.4 kg)   Physical Exam Vitals and nursing note reviewed.  Constitutional:      Appearance: She is not ill-appearing or toxic-appearing.  HENT:     Head: Normocephalic.  Eyes:     Conjunctiva/sclera: Conjunctivae normal.  Cardiovascular:     Rate and Rhythm: Normal rate and regular rhythm.     Pulses: Normal pulses.     Heart sounds: Normal heart sounds.  Pulmonary:     Effort: Pulmonary effort  is normal.     Breath sounds:  Normal breath sounds.  Abdominal:     General: There is no distension.  Musculoskeletal:     Cervical back: Normal range of motion. No tenderness.     Comments: Full range of motion of the T-spine and L-spine No tenderness to palpation of the spinous processes of the T-spine or L-spine No crepitus, deformity or step-offs No tenderness to palpation of the paraspinous muscles of the L-spine  Skin:    General: Skin is warm and dry.  Neurological:     Mental Status: She is alert.     Comments: Sensation grossly intact to light touch in the lower extremities bilaterally. No saddle anesthesias. Strength 5/5 with flexion and extension at the bilateral hips, knees, and ankles. No noted gait deficit.          LABORATORY DATA: I have reviewed the data as listed    Latest Ref Rng & Units 04/12/2023    9:57 AM 03/04/2023    2:17 PM 11/13/2021    8:15 AM  CBC  WBC 4.0 - 10.5 K/uL 5.7  7.0  7.8   Hemoglobin 12.0 - 15.0 g/dL 16.1  09.6  04.5   Hematocrit 36.0 - 46.0 % 42.0  38.6  33.0   Platelets 150 - 400 K/uL 157  209  170         Latest Ref Rng & Units 04/12/2023    9:57 AM 03/04/2023    2:17 PM 11/13/2021    8:15 AM  CMP  Glucose 70 - 99 mg/dL 85  88  409   BUN 6 - 20 mg/dL 18  12  10    Creatinine 0.44 - 1.00 mg/dL 8.11  9.14  7.82   Sodium 135 - 145 mmol/L 141  139  140   Potassium 3.5 - 5.1 mmol/L 4.1  3.5  3.9   Chloride 98 - 111 mmol/L 109  105  108   CO2 22 - 32 mmol/L 27  24  28    Calcium 8.9 - 10.3 mg/dL 9.2  9.0  9.5   Total Protein 6.5 - 8.1 g/dL 6.7   6.6   Total Bilirubin 0.3 - 1.2 mg/dL 0.2   0.4   Alkaline Phos 38 - 126 U/L 56   81   AST 15 - 41 U/L 15   27   ALT 0 - 44 U/L 9   32        RADIOGRAPHIC STUDIES (from last 24 hours if applicable) I have personally reviewed the radiological images as listed and agreed with the findings in the report. DG Lumbar Spine Complete Result Date: 08/31/2023 CLINICAL DATA:  Back pain History of breast cancer EXAM: LUMBAR  SPINE - COMPLETE 4+ VIEW COMPARISON:  04/28/2022 FINDINGS: There is no evidence of lumbar spine fracture. Alignment is normal. Intervertebral disc spaces are maintained. IMPRESSION: No acute abnormality of the lumbar spine. Electronically Signed   By: Acquanetta Belling M.D.   On: 08/31/2023 08:48   DG Chest 2 View Result Date: 08/31/2023 CLINICAL DATA:  Cough EXAM: CHEST - 2 VIEW COMPARISON:  03/04/2023 FINDINGS: Cardiomediastinal silhouette and pulmonary vasculature are within normal limits. Lungs are clear. IMPRESSION: No acute cardiopulmonary process. Electronically Signed   By: Acquanetta Belling M.D.   On: 08/31/2023 08:46        Visit Diagnosis: 1. Malignant neoplasm of upper-outer quadrant of right breast in female, estrogen receptor positive (HCC)   2. Acute bilateral low back pain  without sciatica   3. Acute cough      No orders of the defined types were placed in this encounter.   All questions were answered. The patient knows to call the clinic with any problems, questions or concerns. No barriers to learning was detected.  A total of more than 30 minutes were spent on this encounter with face-to-face time and non-face-to-face time, including preparing to see the patient, ordering tests and/or medications, counseling the patient and coordination of care as outlined above.    Thank you for allowing me to participate in the care of this patient.    Shanon Ace, PA-C Department of Hematology/Oncology [facility] at [facility] Phone: [phone]  [phone]    08/31/2023 10:07 AM

## 2023-08-30 NOTE — Progress Notes (Signed)
Received call from pt with complaint of severe lower back pain x several weeks.  Pt states she has also developed a cough over the last week which is causing an increase in back pain.  Per MD pt needing to be seen by [facility] for further evaluation.  Verbal orders received for chest xray and lumbar spine xray.  Orders placed STAT.  Pt educated about appt date and times and verbalized understanding.

## 2023-08-31 ENCOUNTER — Ambulatory Visit (HOSPITAL_COMMUNITY)
Admission: RE | Admit: 2023-08-31 | Discharge: 2023-08-31 | Disposition: A | Discharge status: Home / Self Care | Payer: No Typology Code available for payment source | Source: Ambulatory Visit | Attending: Physician Assistant | Admitting: Physician Assistant

## 2023-08-31 ENCOUNTER — Inpatient Hospital Stay: Payer: No Typology Code available for payment source | Attending: Physician Assistant | Admitting: Physician Assistant

## 2023-08-31 ENCOUNTER — Encounter: Payer: Self-pay | Admitting: Physician Assistant

## 2023-08-31 VITALS — BP 121/90 | HR 62 | Temp 98.2°F | Resp 18 | Wt 137.6 lb

## 2023-08-31 DIAGNOSIS — Z170 Estrogen receptor positive status [ER+]: Secondary | ICD-10-CM | POA: Diagnosis not present

## 2023-08-31 DIAGNOSIS — R051 Acute cough: Secondary | ICD-10-CM | POA: Diagnosis present

## 2023-08-31 DIAGNOSIS — M545 Low back pain, unspecified: Secondary | ICD-10-CM | POA: Diagnosis present

## 2023-08-31 DIAGNOSIS — C50411 Malignant neoplasm of upper-outer quadrant of right female breast: Secondary | ICD-10-CM | POA: Insufficient documentation

## 2023-08-31 DIAGNOSIS — Z9012 Acquired absence of left breast and nipple: Secondary | ICD-10-CM | POA: Diagnosis not present

## 2023-08-31 DIAGNOSIS — Z803 Family history of malignant neoplasm of breast: Secondary | ICD-10-CM | POA: Diagnosis not present

## 2023-08-31 DIAGNOSIS — R059 Cough, unspecified: Secondary | ICD-10-CM | POA: Insufficient documentation

## 2023-08-31 MED ORDER — BENZONATATE 100 MG PO CAPS: 100.0000 mg | ORAL_CAPSULE | Freq: Three times a day (TID) | ORAL | 0 refills | Status: DC | PRN

## 2023-09-20 ENCOUNTER — Ambulatory Visit (HOSPITAL_BASED_OUTPATIENT_CLINIC_OR_DEPARTMENT_OTHER)
Admission: RE | Admit: 2023-09-20 | Discharge: 2023-09-20 | Disposition: A | Discharge status: Home / Self Care | Payer: No Typology Code available for payment source | Source: Ambulatory Visit | Attending: Hematology and Oncology | Admitting: Hematology and Oncology

## 2023-09-20 DIAGNOSIS — C50411 Malignant neoplasm of upper-outer quadrant of right female breast: Secondary | ICD-10-CM | POA: Insufficient documentation

## 2023-09-20 DIAGNOSIS — Z170 Estrogen receptor positive status [ER+]: Secondary | ICD-10-CM | POA: Insufficient documentation

## 2023-09-20 DIAGNOSIS — Z780 Asymptomatic menopausal state: Secondary | ICD-10-CM | POA: Insufficient documentation

## 2023-09-26 ENCOUNTER — Inpatient Hospital Stay
Payer: No Typology Code available for payment source | Attending: Hematology and Oncology | Admitting: Hematology and Oncology

## 2023-09-26 DIAGNOSIS — C50411 Malignant neoplasm of upper-outer quadrant of right female breast: Secondary | ICD-10-CM

## 2023-09-26 DIAGNOSIS — M810 Age-related osteoporosis without current pathological fracture: Secondary | ICD-10-CM | POA: Diagnosis not present

## 2023-09-26 DIAGNOSIS — M545 Low back pain, unspecified: Secondary | ICD-10-CM

## 2023-09-26 DIAGNOSIS — Z170 Estrogen receptor positive status [ER+]: Secondary | ICD-10-CM

## 2023-09-26 DIAGNOSIS — G8929 Other chronic pain: Secondary | ICD-10-CM

## 2023-09-26 NOTE — Assessment & Plan Note (Signed)
 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 Neo adj chemo with AC X 4 followed by Taxol  and Carboplatin  weekly X 12 2. bilateral mastectomies with reconstruction 12/07/2021: Pathologic complete response in the right breast 0/4 lymph nodes negative 3.  Adjuvant radiation completed 03/01/2022 4. Adj Anti estrogen therapy with tamoxifen  for 10 yrs (started 10 mg daily on 01/11/2022:  Switched to Anastrozole  10/05/22: Patient was in menopause by lab work, switched back to tamoxifen  ------------------------------------------------------------------------------------------------------------------- Tamoxifen  toxicities: Mild to moderate hot flashes   Abdominal discomfort: Following with gastroenterology possibly constipation related Osteoporosis: Bone density 10/05/2022: T-score -2.9.  We are waiting to see if the tamoxifen  makes any improvement in the bone density.  We will recheck a bone density in January 2025.  Will make a decision on bisphosphonate therapy at that time   Lung nodules: CT chest 09/30/2022: No evidence of metastatic disease in the chest.  Previously described right middle lobe nodules are stable and considered benign.  Postradiation changes right lung   Back discomfort: Lumbar spine x-ray: 08/31/2023: Negative Bone density 09/20/2023: T-score -2.7: Osteoporosis: Recommend calcium vitamin D  and weightbearing exercises along with bisphosphonate therapy.   Breast cancer surveillance: Breast exam 04/12/2023: Benign No role of imaging since she had bilateral mastectomies. Signatera testing: Negative

## 2023-09-26 NOTE — Progress Notes (Signed)
 HEMATOLOGY-ONCOLOGY TELEPHONE VISIT PROGRESS NOTE  I connected with our patient on 09/26/23 at 10:15 AM EST by telephone and verified that I am speaking with the correct person using two identifiers.  I discussed the limitations, risks, security and privacy concerns of performing an evaluation and management service by telephone and the availability of in person appointments.  I also discussed with the patient that there may be a patient responsible charge related to this service. The patient expressed understanding and agreed to proceed.   History of Present Illness:    History of Present Illness   The patient, with a history of breast cancer and osteoporosis, presents with persistent back pain, particularly on the left side around the sacroiliac joint area. The pain has improved but is not completely resolved. The patient reports that the pain does not prevent her from engaging in daily activities but is still noticeable.  The patient's recent bone density test showed slight improvement in osteoporosis, with a score of -2.9, which is better than the previous year. The patient is currently on tamoxifen  for breast cancer treatment and has been experiencing side effects such as vaginal atrophy, hair loss, hot flashes, and sleep disturbances. The patient's cancer was only 15% estrogen receptor positive, and she is considering whether to continue with tamoxifen  treatment.  The patient also has concerns about a potential bone issue and is considering seeing a specialist for a second opinion. The patient's mother had experienced compression fractures in her back after stopping Prolia, a bone-strengthening medicine, and the patient is wary of starting a similar treatment. The patient has been taking calcium supplements and has noticed some improvement in her bone health.        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, PR 15% weak to moderate staining (functionally triple negative with HER2 not amplified), with a Ki-67 of 70% (breast MRI 3.1 cm mass)   06/10/2021 Cancer Staging   Staging form: Breast, AJCC 8th Edition - Clinical stage from 06/10/2021: Stage IIA (cT2, cN0, cM0, G3, ER+, PR+, HER2-) - Signed by Layla Sandria BROCKS, MD on 06/10/2021 Stage prefix: Initial diagnosis Histologic grading system: 3 grade system Laterality: Right Staged by: Pathologist and managing physician Stage used in treatment planning: Yes National guidelines used in treatment planning: Yes Type of national guideline used in treatment planning: NCCN   06/16/2021 Genetic Testing   [facility] CancerNext-Expanded was negative. No pathogenic variants were identified. 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 Report date is 06/19/2021.  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 Report date is May 2023.  The CancerNext-Expanded gene panel offered by [facility] and includes sequencing, rearrangement, and RNA analysis for the following 77 genes: AIP, ALK, APC, ATM, AXIN2, BAP1, BARD1, BLM, BMPR1A, BRCA1, BRCA2, BRIP1, CDC73, CDH1, CDK4, CDKN1B, CDKN2A, CHEK2, CTNNA1, DICER1, FANCC, FH, FLCN, GALNT12, KIF1B, LZTR1, MAX, MEN1, MET, MLH1, MSH2, MSH3, MSH6, MUTYH, NBN, NF1, NF2, NTHL1, PALB2, PHOX2B, PMS2, POT1, PRKAR1A, PTCH1, PTEN, RAD51C, RAD51D, RB1, RECQL, RET, SDHA, SDHAF2, SDHB, SDHC, SDHD, SMAD4, SMARCA4, SMARCB1, SMARCE1, STK11, SUFU, TMEM127, TP53, TSC1, TSC2, VHL and XRCC2 (sequencing and deletion/duplication); EGFR, EGLN1, HOXB13, KIT, MITF, PDGFRA, POLD1, and POLE (sequencing only); EPCAM and GREM1 (deletion/duplication only).    06/25/2021 -  Neo-Adjuvant Chemotherapy   neoadjuvant chemotherapy consisting of doxorubicin  and cyclophosphamide  in  dose dense fashion x4 started 0/13/2022, completed  08/10/2021, to be followed by weekly carboplatin  and paclitaxel  x12 starting 08/24/2021   12/07/2021 Surgery   Left mastectomy: Benign Right mastectomy: Negative for residual cancer, 0/4 lymph nodes negative pathologic complete response,   01/11/2022 -  Anti-estrogen oral therapy   Tamoxifen  x 10 years   01/20/2022 - 03/01/2022 Radiation Therapy   Site Technique Total Dose (Gy) Dose per Fx (Gy) Completed Fx Beam Energies  Chest Wall, Right: CW_R 3D 50.4/50.4 1.8 28/28 6XFFF       REVIEW OF SYSTEMS:   Constitutional: Denies fevers, chills or abnormal weight loss All other systems were reviewed with the patient and are negative. 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 Neo adj chemo with AC X 4 followed by Taxol  and Carboplatin  weekly X 12 2. bilateral mastectomies with reconstruction 12/07/2021: Pathologic complete response in the right breast 0/4 lymph nodes negative 3.  Adjuvant radiation completed 03/01/2022 4. Adj Anti estrogen therapy with tamoxifen  for 10 yrs (started 10 mg daily on 01/11/2022:  Switched to Anastrozole  10/05/22: Patient was in menopause by lab work, switched back to tamoxifen  ------------------------------------------------------------------------------------------------------------------- Tamoxifen  toxicities: Mild to moderate hot flashes      Lung nodules: CT chest 09/30/2022: No evidence of metastatic disease in the chest.  Previously described right middle lobe nodules are stable and considered benign.  Postradiation changes right lung   Back discomfort: Lumbar spine x-ray: 08/31/2023: Negative, SI jt pain, improved from before.  Bone  density 09/20/2023: T-score -2.7 (was -2.9): Osteoporosis: Recommend calcium vitamin D  and weightbearing exercises. Discussed bisphosphonate therapy.   Breast cancer surveillance: Breast exam 04/12/2023: Benign No role of imaging since she had bilateral mastectomies. Signatera testing: Negative   Back pain: Will refer her to orthopedics Question about bisphosphonate therapy: Will refer her to endocrinology   I discussed the assessment and treatment plan with the patient. The patient was provided an opportunity to ask questions and all were answered. The patient agreed with the plan and demonstrated an understanding of the instructions. The patient was advised to call back or seek an in-person evaluation if the symptoms worsen or if the condition fails to improve as anticipated.   I provided 20 minutes of non-face-to-face time during this encounter.  This includes time for charting and coordination of care   Naomi MARLA Chad, MD

## 2023-09-30 ENCOUNTER — Ambulatory Visit: Payer: Commercial Managed Care - PPO | Admitting: Orthopedic Surgery

## 2023-09-30 ENCOUNTER — Other Ambulatory Visit (INDEPENDENT_AMBULATORY_CARE_PROVIDER_SITE_OTHER): Payer: Commercial Managed Care - PPO

## 2023-09-30 VITALS — BP 122/76 | HR 76 | Ht 66.0 in | Wt 138.0 lb

## 2023-09-30 DIAGNOSIS — M545 Low back pain, unspecified: Secondary | ICD-10-CM | POA: Diagnosis not present

## 2023-10-01 NOTE — Progress Notes (Addendum)
Orthopedic Spine Surgery Office Note  Assessment: Patient is a 48 y.o. female with left-sided low back pain.  Not having pain today and no physical exam findings to suggest etiology of her pain.  Possible sources include the SI joint or an L5 radiculopathy since she does have some leg symptoms as well   Plan: -Explained that initially conservative treatment is tried as a significant number of patients may experience relief with these treatment modalities. Discussed that the conservative treatments include:  -activity modification  -physical therapy  -over the counter pain medications  -medrol dosepak  -lumbar steroid injections -Patient has tried Tylenol, Aleve -Recommended continue with over-the-counter medications if pain returns.  Would want her to come back in to perform another physical exam -Patient should return to office on an as-needed basis   Patient expressed understanding of the plan and all questions were answered to the patient's satisfaction.   ___________________________________________________________________________   History:  Patient is a 48 y.o. female who presents today for lumbar spine.  Patient has had on and off left lower back pain for a couple of years now.  She feels the pain in the region of the left buttock.  She also sometimes has pain along the lateral aspect of her left leg.  Currently, she is not having any of this pain.  There is no trauma or injury that preceded the onset of this pain.  She does not have any right-sided symptoms.  Pain can occur with activity and at rest.  She has not any pattern or activity that causes her pain to recur.   Weakness: denies Symptoms of imbalance: sometimes with her vertigo Paresthesias and numbness: denies Bowel or bladder incontinence: denies Saddle anesthesia: denies  Treatments tried: tylenol, aleve  Review of systems: Denies fevers and chills, night sweats, unexplained weight loss, pain that wakes them at  night. Has a history of breast cancer  Past medical history: Breast cancer Osteoporosis (not treated currently)  Vertigo  Allergies: cefepime  Past surgical history:  Bilateral mastectomy with reconstruction Port placement and subsequent removal Hernia repair  Abdominoplasty Fifth metatarsal fracture ORIF Cesarean section Salpingo-oophorectomy  Social history: Denies use of nicotine product (smoking, vaping, patches, smokeless) Alcohol use: Yes, approximately 2 drinks per week Denies recreational drug use   Physical Exam:  BMI of 22.3  General: no acute distress, appears stated age Neurologic: alert, answering questions appropriately, following commands Respiratory: unlabored breathing on room air, symmetric chest rise Psychiatric: appropriate affect, normal cadence to speech   MSK (spine):  -Strength exam      Left  Right EHL    5/5  5/5 TA    5/5  5/5 GSC    5/5  5/5 Knee extension  5/5  5/5 Hip flexion   5/5  5/5  -Sensory exam    Sensation intact to light touch in L3-S1 nerve distributions of bilateral lower extremities  -Achilles DTR: 2/4 on the left, 2/4 on the right -Patellar tendon DTR: 2/4 on the left, 2/4 on the right  -Straight leg raise: Negative bilaterally -Femoral nerve stretch test: Negative bilaterally -Clonus: no beats bilaterally  -Left hip exam: No pain through range of motion, negative Stinchfield, negative FABER -Right hip exam: No pain through range of motion, negative Stinchfield, negative FABER  Imaging: XRs of the lumbar spine from 09/30/2023 was independently reviewed and interpreted, showing no significant degenerative changes.  No fracture or dislocation seen.  No evidence of instability on flexion/extension views.   Patient name: Ebony Lamb  Ebony Lamb Patient MRN: [id number] Date of visit: 09/30/2023  I spent 20 minutes with the patient going over her history form and her exam.  I also covered some of the potential etiologies and  treatments that we could use for those various pathologies.

## 2023-10-13 ENCOUNTER — Encounter: Payer: Self-pay | Admitting: Podiatry

## 2023-10-13 ENCOUNTER — Ambulatory Visit (INDEPENDENT_AMBULATORY_CARE_PROVIDER_SITE_OTHER): Payer: No Typology Code available for payment source | Admitting: Podiatry

## 2023-10-13 DIAGNOSIS — B49 Unspecified mycosis: Secondary | ICD-10-CM

## 2023-10-13 DIAGNOSIS — B351 Tinea unguium: Secondary | ICD-10-CM | POA: Diagnosis not present

## 2023-10-13 NOTE — Patient Instructions (Signed)
I have ordered a medication for you that will come from [facility] [place] in [place]. They should be calling you to verify insurance and will mail the medication to you. If you live close by then you can go by their pharmacy to pick up the medication. Their phone number is [phone]. If you do not hear from them in the next few days, please give [name] a call at [phone].

## 2023-10-13 NOTE — Progress Notes (Signed)
Subjective:   Patient ID: Ebony Lamb, female   DOB: 48 y.o.   MRN: [id number]   HPI Chief Complaint  Patient presents with   Nail Problem    RM#12 Right big toe nail discoloration and crack.   48 year old female presents the office today with above concerns.  She is concerned of the right big toenails discolored and is also cracking.  She was on Lamisil in 2020 but she is concerned about doing any other oral medications.  No significant pain of the toenail this time.  No swelling redness or drainage.  No open lesions.  No other concerns.  States her left foot is doing well.   Review of Systems  All other systems reviewed and are negative.       Objective:  Physical Exam  General: AAO x3, NAD  Dermatological: Right hallux toenail is mildly hypertrophic, dystrophic with yellow, brown discoloration.  There is fungal debris present.  No edema, erythema or signs of infection.  No open lesions.  There is some darkening of the toenails along the distal portion of the first, second, third, fourth toe but upon debridement there is more dried blood present and there is no extension of hyperpigmentation of the surrounding skin.  It is very minimal.  Vascular: Dorsalis Pedis artery and Posterior Tibial artery pedal pulses are 2/4 bilateral with immedate capillary fill time.  There is no pain with calf compression, swelling, warmth, erythema.   Neruologic: Grossly intact via light touch bilateral.   Musculoskeletal: No significant exam.     Assessment:   47 year old female with onychomycosis     Plan:  -Treatment options discussed including all alternatives, risks, and complications -Etiology of symptoms were discussed -We discussed different treatment options including oral, topical as well as alternative treatments.  She would like to hold off on oral medication.  I did take a culture of the nail and sent this to [facility] for further evaluation.  Will start topical compound through,  apothecary which I have ordered for her. -Biotin supplement  Return in about 3 months (around 01/11/2024) for nail fungus.  Vivi Barrack DPM

## 2023-10-17 ENCOUNTER — Ambulatory Visit: Payer: No Typology Code available for payment source | Attending: Surgery

## 2023-10-17 ENCOUNTER — Other Ambulatory Visit: Payer: Self-pay | Admitting: Podiatry

## 2023-10-17 VITALS — Wt 134.5 lb

## 2023-10-17 DIAGNOSIS — Z483 Aftercare following surgery for neoplasm: Secondary | ICD-10-CM | POA: Insufficient documentation

## 2023-10-17 NOTE — Therapy (Signed)
OUTPATIENT PHYSICAL THERAPY SOZO SCREENING NOTE   Patient Name: Ebony Lamb MRN: [id number] DOB:Nov 02, 1975, 48 y.o., female Today's Date: 10/17/2023  PCP: Tally Joe, MD REFERRING PROVIDER: Abigail Miyamoto, MD   PT End of Session - 10/17/23 [phone]     Visit Number 13   # unchanged due to screen only   PT Start Time 0939    PT Stop Time 0944    PT Time Calculation (min) 5 min    Activity Tolerance Patient tolerated treatment well    Behavior During Therapy [facility] for tasks assessed/performed             Past Medical History:  Diagnosis Date   Abnormal Pap smear 09/13/2000   Allergy 02/2020   Seasonal   Breast cancer (HCC)    Depression    history of depression- denies currently   Hypertension    Port-A-Cath in place 08/10/2021   Preterm labor    Past Surgical History:  Procedure Laterality Date   BREAST BIOPSY Right 07/27/2019   BREAST RECONSTRUCTION Right 07/27/2022   Procedure: RIGHT NIPPLE REDUCTION;  Surgeon: Glenna Fellows, MD;  Location: [facility];  Service: Plastics;  Laterality: Right;   BREAST RECONSTRUCTION WITH PLACEMENT OF TISSUE EXPANDER AND ALLODERM Bilateral 12/07/2021   Procedure: BREAST RECONSTRUCTION WITH PLACEMENT OF TISSUE EXPANDER AND ALLODERM;  Surgeon: Glenna Fellows, MD;  Location: [facility];  Service: Plastics;  Laterality: Bilateral;   CESAREAN SECTION  11/14/2011   Procedure: CESAREAN SECTION;  Surgeon: Janine Limbo, MD;  Location: [facility];  Service: Gynecology;  Laterality: N/A;  Primary cesarean section with delivery of baby boy at [phone]. Apgars 9/9.   CESAREAN SECTION N/A 03/22/2014   Procedure: Repeat CESAREAN SECTION;  Surgeon: Esmeralda Arthur, MD;  Location: [facility];  Service: Obstetrics;  Laterality: N/A;   COSMETIC SURGERY  11/22/2017   Abdominoplasty   FOOT SURGERY     FRACTURE SURGERY  08/1999   Repair of 5th metatarsal in left foot   HERNIA REPAIR  11/22/2017   NIPPLE SPARING MASTECTOMY Left  12/07/2021   Procedure: LEFT NIPPLE SPARING MASTECTOMY;  Surgeon: Emelia Loron, MD;  Location: [facility];  Service: General;  Laterality: Left;   NIPPLE SPARING MASTECTOMY WITH SENTINEL LYMPH NODE BIOPSY Right 12/07/2021   Procedure: RIGHT NIPPLE SPARING MASTECTOMY WITH AXILLARY SENTINEL LYMPH NODE BIOPSY;  Surgeon: Emelia Loron, MD;  Location: [facility];  Service: General;  Laterality: Right;   PORTACATH PLACEMENT N/A 06/24/2021   Procedure: INSERTION PORT-A-CATH;  Surgeon: Abigail Miyamoto, MD;  Location: [facility];  Service: General;  Laterality: N/A;   REMOVAL OF BILATERAL TISSUE EXPANDERS WITH PLACEMENT OF BILATERAL BREAST IMPLANTS Bilateral 07/27/2022   Procedure: REMOVAL OF BILATERAL TISSUE EXPANDERS WITH PLACEMENT OF BILATERAL BREAST IMPLANTS;  Surgeon: Glenna Fellows, MD;  Location: [facility];  Service: Plastics;  Laterality: Bilateral;   TUBAL LIGATION  11/22/2017   Tubes were completely removed   WISDOM TOOTH EXTRACTION     Patient Active Problem List   Diagnosis Date Noted   Palpitations 05/11/2023   Precordial pain 05/11/2023   Osteoporosis 10/11/2022   Vitamin D deficiency 10/11/2022   Genetic testing 06/17/2021   Family history of breast cancer 06/10/2021   Family history of ovarian cancer 06/10/2021   Family history of BRCA gene mutation 06/10/2021   Malignant neoplasm of upper-outer quadrant of right breast in female, estrogen receptor positive (HCC) 06/08/2021   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 She underwent neoadjuvant chemotherapy and then had bilateral mastectomies with a right sentinel node biopsy (4 negative nodes) on 12/07/2021. 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   PRECAUTIONS: right UE Lymphedema risk, None  SUBJECTIVE: Pt returns for her 3 month L-Dex screen .   PAIN:  Are you having pain? No  SOZO SCREENING: Patient was assessed today using the SOZO machine to determine the lymphedema index score. This was compared to her baseline score. It was determined that she is within the recommended range when compared to her baseline and no further action is needed at this time. She will continue SOZO screenings. These are done every 3 months for 2 years post operatively followed by every 6 months for 2 years, and then annually.  Educated pt to resume HEP that she has been nervous about since implant exchange. She also knows she can reach out to her doctor for physical therapy if her tightness doesn't resolve with HEP stretching.   L-DEX FLOWSHEETS - 10/17/23 0900       L-DEX LYMPHEDEMA SCREENING   Measurement Type Unilateral    L-DEX MEASUREMENT EXTREMITY Upper Extremity    POSITION  Standing    DOMINANT SIDE Right    At Risk Side Right    BASELINE SCORE (UNILATERAL) -0.7    L-DEX SCORE (UNILATERAL) -5.8    VALUE CHANGE (UNILAT) -5.1            P: Pt to begin 6 month L-Dex screens next.   Hermenia Bers, PTA 10/17/2023, 9:43 AM

## 2023-10-18 ENCOUNTER — Encounter: Payer: Self-pay | Admitting: Podiatry

## 2023-11-22 ENCOUNTER — Encounter: Payer: Self-pay | Admitting: Adult Health

## 2023-11-28 ENCOUNTER — Encounter: Payer: Self-pay | Admitting: Adult Health

## 2023-11-28 ENCOUNTER — Inpatient Hospital Stay: Attending: Hematology and Oncology | Admitting: Adult Health

## 2023-11-28 VITALS — BP 113/74 | HR 64 | Temp 97.4°F | Resp 18 | Ht 66.0 in | Wt 136.0 lb

## 2023-11-28 DIAGNOSIS — C50411 Malignant neoplasm of upper-outer quadrant of right female breast: Secondary | ICD-10-CM | POA: Insufficient documentation

## 2023-11-28 DIAGNOSIS — Z803 Family history of malignant neoplasm of breast: Secondary | ICD-10-CM | POA: Insufficient documentation

## 2023-11-28 DIAGNOSIS — Z8041 Family history of malignant neoplasm of ovary: Secondary | ICD-10-CM | POA: Diagnosis not present

## 2023-11-28 DIAGNOSIS — Z79810 Long term (current) use of selective estrogen receptor modulators (SERMs): Secondary | ICD-10-CM | POA: Diagnosis not present

## 2023-11-28 DIAGNOSIS — M79601 Pain in right arm: Secondary | ICD-10-CM | POA: Insufficient documentation

## 2023-11-28 DIAGNOSIS — Z923 Personal history of irradiation: Secondary | ICD-10-CM | POA: Insufficient documentation

## 2023-11-28 DIAGNOSIS — Z9221 Personal history of antineoplastic chemotherapy: Secondary | ICD-10-CM | POA: Diagnosis not present

## 2023-11-28 DIAGNOSIS — Z9013 Acquired absence of bilateral breasts and nipples: Secondary | ICD-10-CM | POA: Insufficient documentation

## 2023-11-28 DIAGNOSIS — Z1732 Human epidermal growth factor receptor 2 negative status: Secondary | ICD-10-CM | POA: Diagnosis not present

## 2023-11-28 DIAGNOSIS — Z1721 Progesterone receptor positive status: Secondary | ICD-10-CM | POA: Diagnosis not present

## 2023-11-28 DIAGNOSIS — Z170 Estrogen receptor positive status [ER+]: Secondary | ICD-10-CM | POA: Insufficient documentation

## 2023-11-28 DIAGNOSIS — M810 Age-related osteoporosis without current pathological fracture: Secondary | ICD-10-CM | POA: Insufficient documentation

## 2023-11-29 ENCOUNTER — Encounter: Payer: Self-pay | Admitting: Adult Health

## 2023-11-29 NOTE — Therapy (Signed)
 OUTPATIENT PHYSICAL THERAPY  UPPER EXTREMITY ONCOLOGY EVALUATION  Patient Name: Ebony Lamb MRN: [id number] DOB:1976/04/17, 48 y.o., female Today's Date: 11/30/2023  END OF SESSION:  PT End of Session - 11/30/23 1012     Visit Number 1    Number of Visits 9    Date for PT Re-Evaluation 01/04/24    Authorization Type none needed    PT Start Time 0807    PT Stop Time 0858    PT Time Calculation (min) 51 min    Activity Tolerance Patient tolerated treatment well    Behavior During Therapy [facility] for tasks assessed/performed             Past Medical History:  Diagnosis Date   Abnormal Pap smear 09/13/2000   Allergy 02/2020   Seasonal   Breast cancer (HCC)    Depression    history of depression- denies currently   Hypertension    Port-A-Cath in place 08/10/2021   Preterm labor    Past Surgical History:  Procedure Laterality Date   BREAST BIOPSY Right 07/27/2019   BREAST RECONSTRUCTION Right 07/27/2022   Procedure: RIGHT NIPPLE REDUCTION;  Surgeon: Glenna Fellows, MD;  Location: [facility];  Service: Plastics;  Laterality: Right;   BREAST RECONSTRUCTION WITH PLACEMENT OF TISSUE EXPANDER AND ALLODERM Bilateral 12/07/2021   Procedure: BREAST RECONSTRUCTION WITH PLACEMENT OF TISSUE EXPANDER AND ALLODERM;  Surgeon: Glenna Fellows, MD;  Location: [facility];  Service: Plastics;  Laterality: Bilateral;   CESAREAN SECTION  11/14/2011   Procedure: CESAREAN SECTION;  Surgeon: Janine Limbo, MD;  Location: [facility];  Service: Gynecology;  Laterality: N/A;  Primary cesarean section with delivery of baby boy at [phone]. Apgars 9/9.   CESAREAN SECTION N/A 03/22/2014   Procedure: Repeat CESAREAN SECTION;  Surgeon: Esmeralda Arthur, MD;  Location: [facility];  Service: Obstetrics;  Laterality: N/A;   COSMETIC SURGERY  11/22/2017   Abdominoplasty   FOOT SURGERY     FRACTURE SURGERY  08/1999   Repair of 5th metatarsal in left foot   HERNIA REPAIR  11/22/2017    NIPPLE SPARING MASTECTOMY Left 12/07/2021   Procedure: LEFT NIPPLE SPARING MASTECTOMY;  Surgeon: Emelia Loron, MD;  Location: [facility];  Service: General;  Laterality: Left;   NIPPLE SPARING MASTECTOMY WITH SENTINEL LYMPH NODE BIOPSY Right 12/07/2021   Procedure: RIGHT NIPPLE SPARING MASTECTOMY WITH AXILLARY SENTINEL LYMPH NODE BIOPSY;  Surgeon: Emelia Loron, MD;  Location: [facility];  Service: General;  Laterality: Right;   PORTACATH PLACEMENT N/A 06/24/2021   Procedure: INSERTION PORT-A-CATH;  Surgeon: Abigail Miyamoto, MD;  Location: [facility];  Service: General;  Laterality: N/A;   REMOVAL OF BILATERAL TISSUE EXPANDERS WITH PLACEMENT OF BILATERAL BREAST IMPLANTS Bilateral 07/27/2022   Procedure: REMOVAL OF BILATERAL TISSUE EXPANDERS WITH PLACEMENT OF BILATERAL BREAST IMPLANTS;  Surgeon: Glenna Fellows, MD;  Location: [facility];  Service: Plastics;  Laterality: Bilateral;   TUBAL LIGATION  11/22/2017   Tubes were completely removed   WISDOM TOOTH EXTRACTION     Patient Active Problem List   Diagnosis Date Noted   Palpitations 05/11/2023   Precordial pain 05/11/2023   Osteoporosis 10/11/2022   Vitamin D deficiency 10/11/2022   Genetic testing 06/17/2021   Family history of breast cancer 06/10/2021   Family history of ovarian cancer 06/10/2021   Family history of BRCA gene mutation 06/10/2021   Malignant neoplasm of upper-outer quadrant of right breast in female, estrogen receptor  positive (HCC) 06/08/2021   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/25/2014   Cesarean delivery delivered 03/22/2014   Pregnancy induced hypertension--with 1st pregnancy 12/27/2011   History of irregular menstrual bleeding 11/12/2011   History of eating disorder 11/12/2011   History of cardiac arrhythmia - PAC's - cardiac work-up normal 11/12/2011    PCP: Tally Joe, MD  REFERRING PROVIDER: Lillard Anes,  NP  REFERRING DIAG: Diagnosis C50.411,Z17.0 (ICD-10-CM) - Malignant neoplasm of upper-outer quadrant of right breast in female, estrogen receptor positive (HCC)    THERAPY DIAG:  Aftercare following surgery for neoplasm  Malignant neoplasm of upper-outer quadrant of right breast in female, estrogen receptor positive (HCC)  Right shoulder pain, unspecified chronicity  At risk for lymphedema  ONSET DATE: 12/07/21  Rationale for Evaluation and Treatment: Rehabilitation  SUBJECTIVE:                                                                                                                                                                                           SUBJECTIVE STATEMENT: I noticed some swelling and pain in the Rt axilla and [name] thought maybe I had some swelling in the arm.    PERTINENT HISTORY:Patient was diagnosed on 06/01/2021 with right grade III invasive ductal carcinoma breast cancer. She underwent neoadjuvant chemotherapy and then had bilateral mastectomies with a right sentinel node biopsy (4 negative nodes) on 12/07/2021. Now has implant reconstruction. 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  Completed radiation on the Rt side over expander.    PAIN:  Are you having pain? 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   Relieving factors: If I think about my posture  , opening stretching   PRECAUTIONS: None  RED FLAGS: None   WEIGHT BEARING RESTRICTIONS: No  FALLS:  Has patient fallen in last 6 months? No  LIVING ENVIRONMENT: Lives with: lives with their family, lives with their spouse, lives with their son, and lives with their daughter  OCCUPATION: Lawyer  LEISURE: Barre   HAND DOMINANCE: right    PRIOR LEVEL OF FUNCTION: Independent  PATIENT GOALS: See what is going on with the pain   OBJECTIVE: Note: Objective measures were completed at Evaluation unless otherwise noted.  COGNITION: Overall cognitive status: Within functional limits for tasks assessed   PALPATION: Cording noted in axilla in supine D2 position and around 100deg of  abduction in mid axilla and along pectoralis border   OBSERVATIONS / OTHER ASSESSMENTS: no sig edema noted just cording   POSTURE: gets worse with stress and work which increases pain  UPPER EXTREMITY AROM/PROM:  A/PROM baseline RIGHT   eval   Shoulder extension  40  Shoulder flexion 166 153  Shoulder abduction 168 160  Shoulder internal rotation    Shoulder external rotation 90 90    (Blank rows = not tested)  CERVICAL AROM: All within normal limits:   UPPER EXTREMITY STRENGTH: Not tested  L-DEX LYMPHEDEMA SCREENING: L-DEX LYMPHEDEMA SCREENING Measurement Type: Unilateral L-DEX MEASUREMENT EXTREMITY: Upper Extremity POSITION : Standing DOMINANT SIDE: Right At Risk Side: Right BASELINE SCORE (UNILATERAL): -0.7 L-DEX SCORE (UNILATERAL): -5.2 VALUE CHANGE (UNILAT): -4.5                                                                                                                            TREATMENT DATE:  11/30/23 Eval performed Cording noted in supine so started MT: Supine STM, cross hands pressure, S pressure with arm propped overhead on pillow and at around 110deg of abduction with straight arm. Education about cording throughout.  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  She will wear her compression garment for now when exercising. Discussed POC.  Discussion and a demo of stretches to include supine Y AROM and Y doorway stretch with pt performing Discussion of LDEX trends   PATIENT EDUCATION:  Education details: per today's note Person educated: Patient Education method: Explanation, Demonstration,  Tactile cues, and Verbal cues Education comprehension: verbalized understanding and returned demonstration  HOME EXERCISE PROGRAM: Y stretch AROM and Y in doorway prolonged stretch, wear compression sleeve during exercise.   ASSESSMENT:  CLINICAL IMPRESSION: Patient is a 48 y.o. female who was seen today for physical therapy evaluation and treatment for her new Rt axillary pain and feeling of swelling on the Rt lateral trunk.  Cording is noted in supine and seems to be the cause for the new symptoms.  She reports no significant changes in activity but did report she increased weight in barre class a few months ago and is doing more pushups.  Discussed cording physiology and reasons and POC.  Pt will benefit from PT at this time to eliminate new cording related pain and feelings of edema. .    OBJECTIVE IMPAIRMENTS: decreased knowledge of condition, decreased ROM, and increased edema.   ACTIVITY LIMITATIONS: carrying and lifting  PARTICIPATION LIMITATIONS: none  PERSONAL FACTORS: 1-2 comorbidities: radiation, SLNB  are also affecting patient's functional outcome.   REHAB POTENTIAL: Excellent  CLINICAL DECISION MAKING: Stable/uncomplicated  EVALUATION COMPLEXITY: Low  GOALS: Goals reviewed with patient? Yes  SHORT TERM GOALS=LTGs: Target date: 12/28/23  Pt will return to baseline feelings of no pain in the Rt axilla  Baseline: 1/10 Goal status: INITIAL  2.  Pt will be ind with final HEP for continued stretching and postural strengthening  Baseline:  Goal  status: INITIAL  PLAN:  PT FREQUENCY: 1-2x/week  PT DURATION: 4 weeks  PLANNED INTERVENTIONS: 97110-Therapeutic exercises, 97530- Therapeutic activity, 97535- Self Care, [id number]- Manual therapy, Patient/Family education, Balance training, Dry Needling, Therapeutic exercises, Therapeutic activity, Neuromuscular re-education, Gait training, and Self Care  PLAN FOR NEXT SESSION: Rt cording release, assess scapula and strength  here as able,   Aleesa Sweigert, Julieanne Manson, PT 11/30/2023, 10:16 AM

## 2023-11-29 NOTE — Assessment & Plan Note (Signed)
   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 Neo adj chemo with AC X 4 followed by Taxol and Carboplatin weekly X 12 2. bilateral mastectomies with reconstruction 12/07/2021: Pathologic complete response in the right breast 0/4 lymph nodes negative 3.  Adjuvant radiation completed 03/01/2022 4. Adj Anti estrogen therapy with tamoxifen for 10 yrs (started 10 mg daily on 01/11/2022:  Switched to Anastrozole 10/05/22: Patient was in menopause by lab work, switched back to tamoxifen ------------------------------------------------------------------------------------------------------------------- Osteoporosis: Bone density 10/05/2022: T-score -2.9.  We are waiting to see if the tamoxifen makes any improvement in the bone density.  We will recheck a bone density in January 2025.  Will make a decision on bisphosphonate therapy at that time Lung nodules: CT chest 09/30/2022: No evidence of metastatic disease in the chest.  Previously described right middle lobe nodules are stable and considered benign.  Postradiation changes right lung  Back discomfort: Lumbar spine x-ray: 08/31/2023: Negative Bone density 09/20/2023: T-score -2.7: Osteoporosis: Recommend calcium vitamin D and weightbearing exercises along with bisphosphonate therapy.   Breast cancer surveillance: Breast exam 04/12/2023: Benign No role of imaging since she had bilateral mastectomies. Signatera testing: Negative   Post-mastectomy arm pain Persistent right arm pain post-mastectomy with minimal swelling. Possible lymphedema considered. SOZO assessment planned. - Arrange SOZO assessment for lymphedema evaluation. - Consider ultrasound if SOZO negative and symptoms persist. - Monitor symptoms until December 05, 2023, report if worsening or no improvement. - Consider sending  picture of discoloration via MyChart to Dr. Leta Baptist for her thoughts.  Tamoxifen-induced xerosis Xerosis likely due to tamoxifen affecting estrogen pathways. Skin care options discussed. - Recommend consideration of La Roche-Posay products, available at [employer].  RTC as scheduled for f/u with Dr. Pamelia Hoit.

## 2023-11-29 NOTE — Progress Notes (Signed)
 [facility] Cancer Follow up:    Ebony Joe, MD [address] [place] [address]   DIAGNOSIS:  Cancer Staging  Malignant neoplasm of upper-outer quadrant of right breast in female, estrogen receptor positive (HCC) Staging form: Breast, AJCC 8th Edition - Clinical stage from 06/10/2021: Stage IIA (cT2, cN0, cM0, G3, ER+, PR+, HER2-) - Signed by Lowella Dell, MD on 06/10/2021 Stage prefix: Initial diagnosis Histologic grading system: 3 grade system Laterality: Right Staged by: Pathologist and managing physician Stage used in treatment planning: Yes National guidelines used in treatment planning: Yes Type of national guideline used in treatment planning: NCCN   SUMMARY OF ONCOLOGIC HISTORY: Oncology History  Malignant neoplasm of upper-outer quadrant of right breast in female, estrogen receptor positive (HCC)  06/04/2021 Initial Diagnosis   T2 N0 IDC, grade 3, ER 15% weak to moderate staining, PR 15% weak to moderate staining (functionally triple negative with HER2 not amplified), with a Ki-67 of 70% (breast MRI 3.1 cm mass)   06/10/2021 Cancer Staging   Staging form: Breast, AJCC 8th Edition - Clinical stage from 06/10/2021: Stage IIA (cT2, cN0, cM0, G3, ER+, PR+, HER2-) - Signed by Lowella Dell, MD on 06/10/2021 Stage prefix: Initial diagnosis Histologic grading system: 3 grade system Laterality: Right Staged by: Pathologist and managing physician Stage used in treatment planning: Yes National guidelines used in treatment planning: Yes Type of national guideline used in treatment planning: NCCN   06/16/2021 Genetic Testing   [facility] CancerNext-Expanded was negative. No pathogenic variants were identified. 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 Report date is 06/19/2021.  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 Report date is May 2023.  The CancerNext-Expanded gene panel offered by  [facility] and includes sequencing, rearrangement, and RNA analysis for the following 77 genes: AIP, ALK, APC, ATM, AXIN2, BAP1, BARD1, BLM, BMPR1A, BRCA1, BRCA2, BRIP1, CDC73, CDH1, CDK4, CDKN1B, CDKN2A, CHEK2, CTNNA1, DICER1, FANCC, FH, FLCN, GALNT12, KIF1B, LZTR1, MAX, MEN1, MET, MLH1, MSH2, MSH3, MSH6, MUTYH, NBN, NF1, NF2, NTHL1, PALB2, PHOX2B, PMS2, POT1, PRKAR1A, PTCH1, PTEN, RAD51C, RAD51D, RB1, RECQL, RET, SDHA, SDHAF2, SDHB, SDHC, SDHD, SMAD4, SMARCA4, SMARCB1, SMARCE1, STK11, SUFU, TMEM127, TP53, TSC1, TSC2, VHL and XRCC2 (sequencing and deletion/duplication); EGFR, EGLN1, HOXB13, KIT, MITF, PDGFRA, POLD1, and POLE (sequencing only); EPCAM and GREM1 (deletion/duplication only).    06/25/2021 -  Neo-Adjuvant Chemotherapy   neoadjuvant chemotherapy consisting of doxorubicin and cyclophosphamide in dose dense fashion x4 started 0/13/2022, completed 08/10/2021, to be followed by weekly carboplatin and paclitaxel x12 starting 08/24/2021   12/07/2021 Surgery   Left mastectomy: Benign Right mastectomy: Negative for residual cancer, 0/4 lymph nodes negative pathologic complete response,   01/11/2022 -  Anti-estrogen oral therapy   Tamoxifen x 10 years   01/20/2022 - 03/01/2022 Radiation Therapy   Site Technique Total Dose (Gy) Dose per Fx (Gy) Completed Fx Beam Energies  Chest Wall, Right: CW_R 3D 50.4/50.4 1.8 28/28 6XFFF       CURRENT THERAPY: Tamoxifen  INTERVAL HISTORY:  Discussed the use of AI scribe software for clinical note transcription with the patient, who gave verbal consent to proceed.  Ebony Lamb 48 y.o. female  a patient with a history of breast cancer status post mastectomy, presents with right arm pain and minimal swelling that started about a week ago. She noticed a slight protrusion on the right side when she flexes her arm, which is not present on the left side.  She also reports dry skin, which she suspects might be a side effect of her Tamoxifen  medication.   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  MEDICAL HISTORY: Past Medical History:  Diagnosis Date   Abnormal Pap smear 09/13/2000   Allergy 02/2020   Seasonal   Breast cancer (HCC)    Depression    history of depression- denies currently   Hypertension    Port-A-Cath in place 08/10/2021   Preterm labor     SURGICAL HISTORY: Past Surgical History:  Procedure Laterality Date   BREAST BIOPSY Right 07/27/2019   BREAST RECONSTRUCTION Right 07/27/2022   Procedure: RIGHT NIPPLE REDUCTION;  Surgeon: Glenna Fellows, MD;  Location: [facility];  Service: Plastics;  Laterality: Right;   BREAST RECONSTRUCTION WITH PLACEMENT OF TISSUE EXPANDER AND ALLODERM Bilateral 12/07/2021   Procedure: BREAST RECONSTRUCTION WITH PLACEMENT OF TISSUE EXPANDER AND ALLODERM;  Surgeon: Glenna Fellows, MD;  Location: [facility];  Service: Plastics;  Laterality: Bilateral;   CESAREAN SECTION  11/14/2011   Procedure: CESAREAN SECTION;  Surgeon: Janine Limbo, MD;  Location: [facility];  Service: Gynecology;  Laterality: N/A;  Primary cesarean section with delivery of  baby boy at [phone]. Apgars 9/9.   CESAREAN SECTION N/A 03/22/2014   Procedure: Repeat CESAREAN SECTION;  Surgeon: Esmeralda Arthur, MD;  Location: [facility];  Service: Obstetrics;  Laterality: N/A;   COSMETIC SURGERY  11/22/2017   Abdominoplasty   FOOT SURGERY     FRACTURE SURGERY  08/1999   Repair of 5th metatarsal in left foot   HERNIA REPAIR  11/22/2017   NIPPLE SPARING MASTECTOMY Left 12/07/2021   Procedure: LEFT NIPPLE SPARING MASTECTOMY;  Surgeon: Emelia Loron, MD;  Location: [facility];  Service: General;  Laterality: Left;   NIPPLE SPARING MASTECTOMY WITH SENTINEL LYMPH NODE BIOPSY Right 12/07/2021   Procedure: RIGHT NIPPLE SPARING MASTECTOMY WITH AXILLARY SENTINEL LYMPH NODE BIOPSY;  Surgeon: Emelia Loron, MD;  Location: [facility];  Service: General;  Laterality: Right;   PORTACATH PLACEMENT N/A 06/24/2021   Procedure: INSERTION PORT-A-CATH;  Surgeon: Abigail Miyamoto, MD;  Location: [facility];  Service: General;  Laterality: N/A;   REMOVAL OF BILATERAL TISSUE EXPANDERS WITH PLACEMENT OF BILATERAL BREAST IMPLANTS Bilateral 07/27/2022   Procedure: REMOVAL OF BILATERAL TISSUE EXPANDERS WITH PLACEMENT OF BILATERAL BREAST IMPLANTS;  Surgeon: Glenna Fellows, MD;  Location: [facility];  Service: Plastics;  Laterality: Bilateral;   TUBAL LIGATION  11/22/2017   Tubes were completely removed   WISDOM TOOTH EXTRACTION      SOCIAL HISTORY: Social History   Socioeconomic History   Marital status: Married    Spouse name: Not on file   Number of  children: Not on file   Years of education: Not on file   Highest education level: Not on file  Occupational History   Not on file  Tobacco Use   Smoking status: Never   Smokeless tobacco: Never  Vaping Use   Vaping status: Never Used  Substance and Sexual Activity   Alcohol use: Not Currently    Alcohol/week: 7.0 standard drinks of alcohol    Types: 7 Standard drinks or  equivalent per week    Comment: I stopped drinking alcohol after finding the lump in my brea   Drug use: No   Sexual activity: Yes    Birth control/protection: Surgical  Other Topics Concern   Not on file  Social History Narrative   Not on file   Social Drivers of Health   Financial Resource Strain: Low Risk  (12/01/2022)   Received from [facility], [facility]   Overall Financial Resource Strain (CARDIA)    Difficulty of Paying Living Expenses: Not hard at all  Food Insecurity: No Food Insecurity (12/01/2022)   Received from [facility], [facility]   Hunger Vital Sign    Worried About Running Out of Food in the Last Year: Never true    Ran Out of Food in the Last Year: Never true  Transportation Needs: No Transportation Needs (12/01/2022)   Received from [facility], [facility]   PRAPARE - Transportation    Lack of Transportation (Medical): No    Lack of Transportation (Non-Medical): No  Physical Activity: Sufficiently Active (12/01/2022)   Received from [facility], [facility]   Exercise Vital Sign    Days of Exercise per Week: 5 days    Minutes of Exercise per Session: 30 min  Stress: Stress Concern Present (12/01/2022)   Received from [facility], [facility] - Occupational Stress Questionnaire    Feeling of Stress : To some extent  Social Connections: Socially Integrated (12/01/2022)   Received from [facility], [facility]   Social Network    How would you rate your social network (family, work, friends)?: Good participation with social networks  Intimate Partner Violence: Not At Risk (12/01/2022)   Received from [facility], [facility]   HITS    Over the last 12 months how often did your partner physically hurt you?: Never    Over the last 12 months how often did your partner insult you or talk down to you?: Never    Over the last 12 months how often did your partner threaten you with physical  harm?: Never    Over the last 12 months how often did your partner scream or curse at you?: Never    FAMILY HISTORY: Family History  Problem Relation Age of Onset   Breast cancer Mother 47   Osteopenia Mother    Arthritis Mother    Other Mother        BRCA2 gene mutation   Ulcerative colitis Mother    Hypertension Father    Kidney disease Father    Depression Father    Hyperlipidemia Father    Melanoma Father 63   Pulmonary fibrosis Father    Hypertension Brother    Hyperlipidemia Brother    Breast cancer Maternal Grandmother        dx. late 30s/40s   Heart disease Maternal Grandfather    Other Maternal Grandfather        BRCA2 gene mutation   Ovarian cancer Paternal Grandmother  dx. 37s   Stroke Paternal Grandfather    Birth defects Son    Anesthesia problems Neg Hx    Diabetes Neg Hx     Review of Systems  Constitutional:  Negative for appetite change, chills, fatigue, fever and unexpected weight change.  HENT:   Negative for hearing loss, lump/mass and trouble swallowing.   Eyes:  Negative for eye problems and icterus.  Respiratory:  Negative for chest tightness, cough and shortness of breath.   Cardiovascular:  Negative for chest pain, leg swelling and palpitations.  Gastrointestinal:  Negative for abdominal distention, abdominal pain, constipation, diarrhea, nausea and vomiting.  Endocrine: Negative for hot flashes.  Genitourinary:  Negative for difficulty urinating.   Musculoskeletal:  Negative for arthralgias.  Skin:  Negative for itching and rash.  Neurological:  Negative for dizziness, extremity weakness, headaches and numbness.  Hematological:  Negative for adenopathy. Does not bruise/bleed easily.  Psychiatric/Behavioral:  Negative for depression. The patient is not nervous/anxious.       PHYSICAL EXAMINATION    Vitals:   11/28/23 1153  BP: 113/74  Pulse: 64  Resp: 18  Temp: (!) 97.4 F (36.3 C)  SpO2: 100%    Physical  Exam Constitutional:      General: She is not in acute distress.    Appearance: Normal appearance. She is not toxic-appearing.  HENT:     Head: Normocephalic and atraumatic.     Mouth/Throat:     Mouth: Mucous membranes are moist.     Pharynx: Oropharynx is clear. No oropharyngeal exudate or posterior oropharyngeal erythema.  Eyes:     General: No scleral icterus. Cardiovascular:     Rate and Rhythm: Normal rate and regular rhythm.     Pulses: Normal pulses.     Heart sounds: Normal heart sounds.  Pulmonary:     Effort: Pulmonary effort is normal.     Breath sounds: Normal breath sounds.  Chest:     Comments: Bilateral breast exam benign, fullness noted right lateral chest wall underneath axilla, ecchymosis noted above right nipple, no nodularity noted Abdominal:     General: Abdomen is flat. Bowel sounds are normal. There is no distension.     Palpations: Abdomen is soft.     Tenderness: There is no abdominal tenderness.  Musculoskeletal:        General: Swelling (? Slight swelling in right arm) present.     Cervical back: Neck supple.  Lymphadenopathy:     Cervical: No cervical adenopathy.     Upper Body:     Right upper body: No supraclavicular or axillary adenopathy.     Left upper body: No supraclavicular or axillary adenopathy.  Skin:    General: Skin is warm and dry.     Findings: No rash.  Neurological:     General: No focal deficit present.     Mental Status: She is alert.  Psychiatric:        Mood and Affect: Mood normal.        Behavior: Behavior normal.      ASSESSMENT and THERAPY PLAN:   Malignant neoplasm of upper-outer quadrant of right breast in female, estrogen receptor positive (HCC)   06/04/2021:T2 N0 IDC, grade 3, ER 15% weak to moderate staining, PR 15% weak to moderate staining (functionally triple negative with HER2 not amplified), with a Ki-67 of 70% (breast MRI 3.1 cm mass)   Treatment Plan: 1. Neo adj chemo with AC X 4 followed by Taxol  and Carboplatin weekly X  12 2. bilateral mastectomies with reconstruction 12/07/2021: Pathologic complete response in the right breast 0/4 lymph nodes negative 3.  Adjuvant radiation completed 03/01/2022 4. Adj Anti estrogen therapy with tamoxifen for 10 yrs (started 10 mg daily on 01/11/2022:  Switched to Anastrozole 10/05/22: Patient was in menopause by lab work, switched back to tamoxifen ------------------------------------------------------------------------------------------------------------------- Osteoporosis: Bone density 10/05/2022: T-score -2.9.  We are waiting to see if the tamoxifen makes any improvement in the bone density.  We will recheck a bone density in January 2025.  Will make a decision on bisphosphonate therapy at that time Lung nodules: CT chest 09/30/2022: No evidence of metastatic disease in the chest.  Previously described right middle lobe nodules are stable and considered benign.  Postradiation changes right lung  Back discomfort: Lumbar spine x-ray: 08/31/2023: Negative Bone density 09/20/2023: T-score -2.7: Osteoporosis: Recommend calcium vitamin D and weightbearing exercises along with bisphosphonate therapy.   Breast cancer surveillance: Breast exam 04/12/2023: Benign No role of imaging since she had bilateral mastectomies. Signatera testing: Negative   Post-mastectomy arm pain Persistent right arm pain post-mastectomy with minimal swelling. Possible lymphedema considered. SOZO assessment planned. - Arrange SOZO assessment for lymphedema evaluation. - Consider ultrasound if SOZO negative and symptoms persist. - Monitor symptoms until December 05, 2023, report if worsening or no improvement. - Consider sending picture of discoloration via MyChart to Dr. Leta Baptist for her thoughts.  Tamoxifen-induced xerosis Xerosis likely due to tamoxifen affecting estrogen pathways. Skin care options discussed. - Recommend consideration of La Roche-Posay products, available at  [employer].  RTC as scheduled for f/u with Dr. Pamelia Hoit.    All questions were answered. The patient knows to call the clinic with any problems, questions or concerns. We can certainly see the patient much sooner if necessary.  Total encounter time:20 minutes*in face-to-face visit time, chart review, lab review, care coordination, order entry, and documentation of the encounter time.    Lillard Anes, NP 11/29/23 12:53 PM Medical Oncology and Hematology [facility] [address] [place], [place] [address] Tel. [phone]    Fax. [phone]  *Total Encounter Time as defined by the Centers for Medicare and Medicaid Services includes, in addition to the face-to-face time of a patient visit (documented in the note above) non-face-to-face time: obtaining and reviewing outside history, ordering and reviewing medications, tests or procedures, care coordination (communications with other health care professionals or caregivers) and documentation in the medical record.

## 2023-11-30 ENCOUNTER — Encounter: Payer: Self-pay | Admitting: Rehabilitation

## 2023-11-30 ENCOUNTER — Other Ambulatory Visit: Payer: Self-pay

## 2023-11-30 ENCOUNTER — Ambulatory Visit: Attending: Adult Health | Admitting: Rehabilitation

## 2023-11-30 DIAGNOSIS — Z9189 Other specified personal risk factors, not elsewhere classified: Secondary | ICD-10-CM | POA: Diagnosis not present

## 2023-11-30 DIAGNOSIS — M25511 Pain in right shoulder: Secondary | ICD-10-CM

## 2023-11-30 DIAGNOSIS — Z483 Aftercare following surgery for neoplasm: Secondary | ICD-10-CM | POA: Diagnosis present

## 2023-11-30 DIAGNOSIS — C50411 Malignant neoplasm of upper-outer quadrant of right female breast: Secondary | ICD-10-CM | POA: Diagnosis present

## 2023-11-30 DIAGNOSIS — Z170 Estrogen receptor positive status [ER+]: Secondary | ICD-10-CM | POA: Diagnosis present

## 2023-12-07 ENCOUNTER — Ambulatory Visit: Admitting: Rehabilitation

## 2023-12-07 ENCOUNTER — Encounter: Payer: Self-pay | Admitting: Rehabilitation

## 2023-12-07 DIAGNOSIS — Z483 Aftercare following surgery for neoplasm: Secondary | ICD-10-CM

## 2023-12-07 DIAGNOSIS — Z9189 Other specified personal risk factors, not elsewhere classified: Secondary | ICD-10-CM

## 2023-12-07 DIAGNOSIS — C50411 Malignant neoplasm of upper-outer quadrant of right female breast: Secondary | ICD-10-CM

## 2023-12-07 DIAGNOSIS — M25511 Pain in right shoulder: Secondary | ICD-10-CM

## 2023-12-07 NOTE — Therapy (Signed)
 OUTPATIENT PHYSICAL THERAPY  UPPER EXTREMITY ONCOLOGY  Patient Name: Ebony Lamb MRN: [id number] DOB:05/20/1976, 48 y.o., female Today's Date: 12/07/2023  END OF SESSION:  PT End of Session - 12/07/23 0809     Visit Number 2    Number of Visits 9    Date for PT Re-Evaluation 01/04/24    PT Start Time 0812    PT Stop Time 0856    PT Time Calculation (min) 44 min    Activity Tolerance Patient tolerated treatment well    Behavior During Therapy [facility] for tasks assessed/performed             Past Medical History:  Diagnosis Date   Abnormal Pap smear 09/13/2000   Allergy 02/2020   Seasonal   Breast cancer (HCC)    Depression    history of depression- denies currently   Hypertension    Port-A-Cath in place 08/10/2021   Preterm labor    Past Surgical History:  Procedure Laterality Date   BREAST BIOPSY Right 07/27/2019   BREAST RECONSTRUCTION Right 07/27/2022   Procedure: RIGHT NIPPLE REDUCTION;  Surgeon: Glenna Fellows, MD;  Location: [facility];  Service: Plastics;  Laterality: Right;   BREAST RECONSTRUCTION WITH PLACEMENT OF TISSUE EXPANDER AND ALLODERM Bilateral 12/07/2021   Procedure: BREAST RECONSTRUCTION WITH PLACEMENT OF TISSUE EXPANDER AND ALLODERM;  Surgeon: Glenna Fellows, MD;  Location: [facility];  Service: Plastics;  Laterality: Bilateral;   CESAREAN SECTION  11/14/2011   Procedure: CESAREAN SECTION;  Surgeon: Janine Limbo, MD;  Location: [facility];  Service: Gynecology;  Laterality: N/A;  Primary cesarean section with delivery of baby boy at [phone]. Apgars 9/9.   CESAREAN SECTION N/A 03/22/2014   Procedure: Repeat CESAREAN SECTION;  Surgeon: Esmeralda Arthur, MD;  Location: [facility];  Service: Obstetrics;  Laterality: N/A;   COSMETIC SURGERY  11/22/2017   Abdominoplasty   FOOT SURGERY     FRACTURE SURGERY  08/1999   Repair of 5th metatarsal in left foot   HERNIA REPAIR  11/22/2017   NIPPLE SPARING MASTECTOMY Left 12/07/2021    Procedure: LEFT NIPPLE SPARING MASTECTOMY;  Surgeon: Emelia Loron, MD;  Location: [facility];  Service: General;  Laterality: Left;   NIPPLE SPARING MASTECTOMY WITH SENTINEL LYMPH NODE BIOPSY Right 12/07/2021   Procedure: RIGHT NIPPLE SPARING MASTECTOMY WITH AXILLARY SENTINEL LYMPH NODE BIOPSY;  Surgeon: Emelia Loron, MD;  Location: [facility];  Service: General;  Laterality: Right;   PORTACATH PLACEMENT N/A 06/24/2021   Procedure: INSERTION PORT-A-CATH;  Surgeon: Abigail Miyamoto, MD;  Location: [facility];  Service: General;  Laterality: N/A;   REMOVAL OF BILATERAL TISSUE EXPANDERS WITH PLACEMENT OF BILATERAL BREAST IMPLANTS Bilateral 07/27/2022   Procedure: REMOVAL OF BILATERAL TISSUE EXPANDERS WITH PLACEMENT OF BILATERAL BREAST IMPLANTS;  Surgeon: Glenna Fellows, MD;  Location: [facility];  Service: Plastics;  Laterality: Bilateral;   TUBAL LIGATION  11/22/2017   Tubes were completely removed   WISDOM TOOTH EXTRACTION     Patient Active Problem List   Diagnosis Date Noted   Palpitations 05/11/2023   Precordial pain 05/11/2023   Osteoporosis 10/11/2022   Vitamin D deficiency 10/11/2022   Genetic testing 06/17/2021   Family history of breast cancer 06/10/2021   Family history of ovarian cancer 06/10/2021   Family history of BRCA gene mutation 06/10/2021   Malignant neoplasm of upper-outer quadrant of right breast in female, estrogen receptor positive (HCC) 06/08/2021   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) 03/25/2014   Cesarean delivery delivered 03/22/2014   Pregnancy induced hypertension--with 1st pregnancy 12/27/2011   History of irregular menstrual bleeding 11/12/2011   History of eating disorder 11/12/2011   History of cardiac arrhythmia - PAC's - cardiac work-up normal 11/12/2011    PCP: Tally Joe, MD  REFERRING PROVIDER: Lillard Anes, NP  REFERRING DIAG:  Diagnosis C50.411,Z17.0 (ICD-10-CM) - Malignant neoplasm of upper-outer quadrant of right breast in female, estrogen receptor positive (HCC)    THERAPY DIAG:  Aftercare following surgery for neoplasm  Malignant neoplasm of upper-outer quadrant of right breast in female, estrogen receptor positive (HCC)  Right shoulder pain, unspecified chronicity  At risk for lymphedema  ONSET DATE: 12/07/21  Rationale for Evaluation and Treatment: Rehabilitation  SUBJECTIVE:                                                                                                                                                                                           SUBJECTIVE STATEMENT:  It felt a bit better even after the first time.   EVAL: I noticed some swelling and pain in the Rt axilla and [name] thought maybe I had some swelling in the arm.    PERTINENT HISTORY:Patient was diagnosed on 06/01/2021 with right grade III invasive ductal carcinoma breast cancer. She underwent neoadjuvant chemotherapy and then had bilateral mastectomies with a right sentinel node biopsy (4 negative nodes) on 12/07/2021. Now has implant reconstruction. 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  Completed radiation on the Rt side over expander.    PAIN:  Are you having pain? 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   Relieving factors: If I think about my posture  , opening stretching   PRECAUTIONS: None  RED FLAGS: None   WEIGHT BEARING RESTRICTIONS: No  FALLS:  Has patient fallen in last 6 months? No  LIVING ENVIRONMENT: Lives with: lives with their family, lives with their spouse, lives with their son, and lives with their daughter  OCCUPATION:  Lawyer  LEISURE: Barre   HAND DOMINANCE: right   PRIOR LEVEL OF FUNCTION: Independent  PATIENT GOALS: See what is going on with the pain   OBJECTIVE: Note: Objective measures were completed at Evaluation unless otherwise noted.  COGNITION: Overall cognitive status: Within functional limits for tasks assessed   PALPATION: Cording noted in axilla in supine  D2 position and around 100deg of abduction in mid axilla and along pectoralis border   OBSERVATIONS / OTHER ASSESSMENTS: no sig edema noted just cording   POSTURE: gets worse with stress and work which increases pain  UPPER EXTREMITY AROM/PROM:  A/PROM baseline RIGHT   eval   Shoulder extension  40  Shoulder flexion 166 153  Shoulder abduction 168 160  Shoulder internal rotation    Shoulder external rotation 90 90    (Blank rows = not tested)  CERVICAL AROM: All within normal limits:   UPPER EXTREMITY STRENGTH: Not tested  L-DEX LYMPHEDEMA SCREENING:                                                                                                                              TREATMENT DATE:  12/07/23 Therapeutic Exercise: Showed tennis ball release on the wall Wall ball flexion 5" x 5 (abd easy) Chest wall stretch single arm 2x20" Doorway Y 2x20" Manual Therapy Supine STM / cording release, cross hands pressure, S pressure with arm propped overhead on pillow and at around 110deg of abduction with straight arm.  11/30/23 Eval performed Cording noted in supine so started MT: Supine STM, cross hands pressure, S pressure with arm propped overhead on pillow and at around 110deg of abduction with straight arm. Education about cording throughout.  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  She will wear her compression garment for now when exercising. Discussed POC.  Discussion and a demo of stretches to include supine Y AROM and Y doorway stretch with pt performing Discussion of LDEX trends   PATIENT  EDUCATION:  Education details: per today's note Person educated: Patient Education method: Explanation, Demonstration, Tactile cues, and Verbal cues Education comprehension: verbalized understanding and returned demonstration  HOME EXERCISE PROGRAM: Y stretch AROM and Y in doorway prolonged stretch, wear compression sleeve during exercise.   ASSESSMENT:  CLINICAL IMPRESSION: Continued POC with focus on Rt UE cording work.  3 small cords present .    OBJECTIVE IMPAIRMENTS: decreased knowledge of condition, decreased ROM, and increased edema.   ACTIVITY LIMITATIONS: carrying and lifting  PARTICIPATION LIMITATIONS: none  PERSONAL FACTORS: 1-2 comorbidities: radiation, SLNB  are also affecting patient's functional outcome.   REHAB POTENTIAL: Excellent  CLINICAL DECISION MAKING: Stable/uncomplicated  EVALUATION COMPLEXITY: Low  GOALS: Goals reviewed with patient? Yes  SHORT TERM GOALS=LTGs: Target date: 12/28/23  Pt will return to baseline feelings of no pain in the Rt axilla  Baseline: 1/10 Goal status: INITIAL  2.  Pt will be ind with final HEP for continued stretching and postural strengthening  Baseline:  Goal status: INITIAL  PLAN:  PT FREQUENCY: 1-2x/week  PT DURATION: 4 weeks  PLANNED INTERVENTIONS: 97110-Therapeutic exercises, 97530- Therapeutic activity, 97535- Self Care, 16109- Manual therapy, Patient/Family education, Balance training, Dry Needling, Therapeutic exercises, Therapeutic activity, Neuromuscular re-education, Gait training, and Self Care  PLAN FOR NEXT SESSION: Rt cording release, assess scapula and strength  here as able,   Idamae Lusher, PT 12/07/2023, 8:56 AM

## 2023-12-09 ENCOUNTER — Encounter: Payer: Self-pay | Admitting: Rehabilitation

## 2023-12-09 ENCOUNTER — Ambulatory Visit: Admitting: Rehabilitation

## 2023-12-09 DIAGNOSIS — Z9189 Other specified personal risk factors, not elsewhere classified: Secondary | ICD-10-CM

## 2023-12-09 DIAGNOSIS — Z483 Aftercare following surgery for neoplasm: Secondary | ICD-10-CM | POA: Diagnosis not present

## 2023-12-09 DIAGNOSIS — C50411 Malignant neoplasm of upper-outer quadrant of right female breast: Secondary | ICD-10-CM

## 2023-12-09 DIAGNOSIS — M25511 Pain in right shoulder: Secondary | ICD-10-CM

## 2023-12-09 NOTE — Therapy (Signed)
 OUTPATIENT PHYSICAL THERAPY  UPPER EXTREMITY ONCOLOGY  Patient Name: Ebony Lamb MRN: [id number] DOB:1976-07-06, 48 y.o., female Today's Date: 12/09/2023  END OF SESSION:  PT End of Session - 12/09/23 0949     Visit Number 3    Number of Visits 9    Date for PT Re-Evaluation 01/04/24    PT Start Time 0804    PT Stop Time 0851    PT Time Calculation (min) 47 min    Activity Tolerance Patient tolerated treatment well    Behavior During Therapy [facility] for tasks assessed/performed              Past Medical History:  Diagnosis Date   Abnormal Pap smear 09/13/2000   Allergy 02/2020   Seasonal   Breast cancer (HCC)    Depression    history of depression- denies currently   Hypertension    Port-A-Cath in place 08/10/2021   Preterm labor    Past Surgical History:  Procedure Laterality Date   BREAST BIOPSY Right 07/27/2019   BREAST RECONSTRUCTION Right 07/27/2022   Procedure: RIGHT NIPPLE REDUCTION;  Surgeon: Glenna Fellows, MD;  Location: [facility];  Service: Plastics;  Laterality: Right;   BREAST RECONSTRUCTION WITH PLACEMENT OF TISSUE EXPANDER AND ALLODERM Bilateral 12/07/2021   Procedure: BREAST RECONSTRUCTION WITH PLACEMENT OF TISSUE EXPANDER AND ALLODERM;  Surgeon: Glenna Fellows, MD;  Location: [facility];  Service: Plastics;  Laterality: Bilateral;   CESAREAN SECTION  11/14/2011   Procedure: CESAREAN SECTION;  Surgeon: Janine Limbo, MD;  Location: [facility];  Service: Gynecology;  Laterality: N/A;  Primary cesarean section with delivery of baby boy at [phone]. Apgars 9/9.   CESAREAN SECTION N/A 03/22/2014   Procedure: Repeat CESAREAN SECTION;  Surgeon: Esmeralda Arthur, MD;  Location: [facility];  Service: Obstetrics;  Laterality: N/A;   COSMETIC SURGERY  11/22/2017   Abdominoplasty   FOOT SURGERY     FRACTURE SURGERY  08/1999   Repair of 5th metatarsal in left foot   HERNIA REPAIR  11/22/2017   NIPPLE SPARING MASTECTOMY Left 12/07/2021    Procedure: LEFT NIPPLE SPARING MASTECTOMY;  Surgeon: Emelia Loron, MD;  Location: [facility];  Service: General;  Laterality: Left;   NIPPLE SPARING MASTECTOMY WITH SENTINEL LYMPH NODE BIOPSY Right 12/07/2021   Procedure: RIGHT NIPPLE SPARING MASTECTOMY WITH AXILLARY SENTINEL LYMPH NODE BIOPSY;  Surgeon: Emelia Loron, MD;  Location: [facility];  Service: General;  Laterality: Right;   PORTACATH PLACEMENT N/A 06/24/2021   Procedure: INSERTION PORT-A-CATH;  Surgeon: Abigail Miyamoto, MD;  Location: [facility];  Service: General;  Laterality: N/A;   REMOVAL OF BILATERAL TISSUE EXPANDERS WITH PLACEMENT OF BILATERAL BREAST IMPLANTS Bilateral 07/27/2022   Procedure: REMOVAL OF BILATERAL TISSUE EXPANDERS WITH PLACEMENT OF BILATERAL BREAST IMPLANTS;  Surgeon: Glenna Fellows, MD;  Location: [facility];  Service: Plastics;  Laterality: Bilateral;   TUBAL LIGATION  11/22/2017   Tubes were completely removed   WISDOM TOOTH EXTRACTION     Patient Active Problem List   Diagnosis Date Noted   Palpitations 05/11/2023   Precordial pain 05/11/2023   Osteoporosis 10/11/2022   Vitamin D deficiency 10/11/2022   Genetic testing 06/17/2021   Family history of breast cancer 06/10/2021   Family history of ovarian cancer 06/10/2021   Family history of BRCA gene mutation 06/10/2021   Malignant neoplasm of upper-outer quadrant of right breast in female, estrogen receptor positive (HCC) 06/08/2021   Balanced chromosomal  translocation in fetus (14 and 21) 03/25/2014   Cesarean delivery delivered 03/22/2014   Pregnancy induced hypertension--with 1st pregnancy 12/27/2011   History of irregular menstrual bleeding 11/12/2011   History of eating disorder 11/12/2011   History of cardiac arrhythmia - PAC's - cardiac work-up normal 11/12/2011    PCP: Tally Joe, MD  REFERRING PROVIDER: Lillard Anes, NP  REFERRING DIAG:  Diagnosis C50.411,Z17.0 (ICD-10-CM) - Malignant neoplasm of upper-outer quadrant of right breast in female, estrogen receptor positive (HCC)    THERAPY DIAG:  Aftercare following surgery for neoplasm  Malignant neoplasm of upper-outer quadrant of right breast in female, estrogen receptor positive (HCC)  Right shoulder pain, unspecified chronicity  At risk for lymphedema  ONSET DATE: 12/07/21  Rationale for Evaluation and Treatment: Rehabilitation  SUBJECTIVE:                                                                                                                                                                                           SUBJECTIVE STATEMENT:  I went to barre 2 times and it was okay.  I did wore my sleeve.   EVAL: I noticed some swelling and pain in the Rt axilla and [name] thought maybe I had some swelling in the arm.    PERTINENT HISTORY:Patient was diagnosed on 06/01/2021 with right grade III invasive ductal carcinoma breast cancer. She underwent neoadjuvant chemotherapy and then had bilateral mastectomies with a right sentinel node biopsy (4 negative nodes) on 12/07/2021. Now has implant reconstruction. 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  Completed radiation on the Rt side over expander.    PAIN:  Are you having pain? 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   Relieving factors: If I think about my posture  , opening stretching   PRECAUTIONS: None  RED FLAGS: None   WEIGHT BEARING RESTRICTIONS: No  FALLS:  Has patient fallen in last 6 months? No  LIVING ENVIRONMENT: Lives with: lives with their family, lives with their spouse, lives with their son, and lives with their daughter  OCCUPATION:  Lawyer  LEISURE: Barre   HAND DOMINANCE: right   PRIOR LEVEL OF FUNCTION: Independent  PATIENT GOALS: See what is going on with the pain   OBJECTIVE: Note: Objective measures were completed at Evaluation unless otherwise noted.  COGNITION: Overall cognitive status: Within functional limits for tasks assessed  PALPATION: Cording noted in axilla in supine D2 position and around 100deg of abduction in mid axilla and along pectoralis border   OBSERVATIONS / OTHER ASSESSMENTS: no sig edema noted just cording   POSTURE: gets worse with stress and work which increases pain  UPPER EXTREMITY AROM/PROM:  A/PROM baseline RIGHT   eval   Shoulder extension  40  Shoulder flexion 166 153  Shoulder abduction 168 160  Shoulder internal rotation    Shoulder external rotation 90 90    (Blank rows = not tested)  CERVICAL AROM: All within normal limits:   UPPER EXTREMITY STRENGTH: Not tested  L-DEX LYMPHEDEMA SCREENING:                                                                                                                              TREATMENT DATE:  12/09/23 Therapeutic Exercise: Wall ball flexion 5" x 5 Chest wall stretch single arm 2x20" Doorway Y 2x20" Manual Therapy Supine STM / cording release, cross hands pressure, S pressure with arm propped overhead on pillow and at around 110deg of abduction with straight arm.  12/07/23 Therapeutic Exercise: Showed tennis ball release on the wall Wall ball flexion 5" x 5 (abd easy) Chest wall stretch single arm 2x20" Doorway Y 2x20" Manual Therapy Supine STM / cording release, cross hands pressure, S pressure with arm propped overhead on pillow and at around 110deg of abduction with straight arm.  11/30/23 Eval performed Cording noted in supine so started MT: Supine STM, cross hands pressure, S pressure with arm propped overhead on pillow and at around 110deg of abduction with straight arm. Education about cording  throughout.  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  She will wear her compression garment for now when exercising. Discussed POC.  Discussion and a demo of stretches to include supine Y AROM and Y doorway stretch with pt performing Discussion of LDEX trends   PATIENT EDUCATION:  Education details: per today's note Person educated: Patient Education method: Explanation, Demonstration, Tactile cues, and Verbal cues Education comprehension: verbalized understanding and returned demonstration  HOME EXERCISE PROGRAM: Y stretch AROM and Y in doorway prolonged stretch, wear compression sleeve during exercise.   ASSESSMENT:  CLINICAL IMPRESSION: Continued POC with focus on Rt UE cording work.  3 small cords present  with the one along the pectoralis the most evident.    OBJECTIVE IMPAIRMENTS: decreased knowledge of condition, decreased ROM, and increased edema.   ACTIVITY LIMITATIONS: carrying and lifting  PARTICIPATION LIMITATIONS: none  PERSONAL FACTORS: 1-2 comorbidities: radiation, SLNB  are also affecting patient's functional outcome.   REHAB POTENTIAL: Excellent  CLINICAL DECISION MAKING: Stable/uncomplicated  EVALUATION COMPLEXITY: Low  GOALS: Goals reviewed with patient? Yes  SHORT TERM GOALS=LTGs: Target date: 12/28/23  Pt will return to baseline feelings of no pain in the Rt axilla  Baseline: 1/10 Goal status: INITIAL  2.  Pt will be ind with final HEP for continued stretching and  postural strengthening  Baseline:  Goal status: INITIAL  PLAN:  PT FREQUENCY: 1-2x/week  PT DURATION: 4 weeks  PLANNED INTERVENTIONS: 97110-Therapeutic exercises, 97530- Therapeutic activity, 97535- Self Care, 16109- Manual therapy, Patient/Family education, Balance training, Dry Needling, Therapeutic exercises, Therapeutic activity, Neuromuscular re-education, Gait training, and Self Care  PLAN FOR NEXT SESSION: Rt cording release, assess scapula and strength here as  able,   Idamae Lusher, PT 12/09/2023, 9:49 AM

## 2023-12-12 ENCOUNTER — Ambulatory Visit: Admitting: Rehabilitation

## 2023-12-13 ENCOUNTER — Other Ambulatory Visit: Payer: Self-pay | Admitting: *Deleted

## 2023-12-13 DIAGNOSIS — Z170 Estrogen receptor positive status [ER+]: Secondary | ICD-10-CM

## 2023-12-13 NOTE — Progress Notes (Signed)
 Orders placed for [employer] [facility].

## 2023-12-14 ENCOUNTER — Ambulatory Visit: Attending: Adult Health | Admitting: Rehabilitation

## 2023-12-14 DIAGNOSIS — Z9189 Other specified personal risk factors, not elsewhere classified: Secondary | ICD-10-CM | POA: Diagnosis present

## 2023-12-14 DIAGNOSIS — Z170 Estrogen receptor positive status [ER+]: Secondary | ICD-10-CM | POA: Diagnosis present

## 2023-12-14 DIAGNOSIS — C50411 Malignant neoplasm of upper-outer quadrant of right female breast: Secondary | ICD-10-CM | POA: Insufficient documentation

## 2023-12-14 DIAGNOSIS — M25511 Pain in right shoulder: Secondary | ICD-10-CM | POA: Diagnosis present

## 2023-12-14 DIAGNOSIS — M25611 Stiffness of right shoulder, not elsewhere classified: Secondary | ICD-10-CM | POA: Diagnosis present

## 2023-12-14 DIAGNOSIS — R293 Abnormal posture: Secondary | ICD-10-CM | POA: Diagnosis present

## 2023-12-14 DIAGNOSIS — M25612 Stiffness of left shoulder, not elsewhere classified: Secondary | ICD-10-CM | POA: Insufficient documentation

## 2023-12-14 DIAGNOSIS — Z483 Aftercare following surgery for neoplasm: Secondary | ICD-10-CM | POA: Diagnosis present

## 2023-12-14 NOTE — Therapy (Signed)
 OUTPATIENT PHYSICAL THERAPY  UPPER EXTREMITY ONCOLOGY  Patient Name: Ebony [facility] MRN: [id number] DOB:08/31/1976, 48 y.o., female Today's Date: 12/14/2023  END OF SESSION:  PT End of Session - 12/14/23 0859     Visit Number 4    Number of Visits 9    Date for PT Re-Evaluation 01/04/24    PT Start Time 0806    PT Stop Time 0853   pt having to leave to give husband a key   PT Time Calculation (min) 47 min    Activity Tolerance Patient tolerated treatment well    Behavior During Therapy Ebony [facility] for tasks assessed/performed               Past Medical History:  Diagnosis Date   Abnormal Pap smear 09/13/2000   Allergy 02/2020   Seasonal   Breast cancer (HCC)    Depression    history of depression- denies currently   Hypertension    Port-A-Cath in place 08/10/2021   Preterm labor    Past Surgical History:  Procedure Laterality Date   BREAST BIOPSY Right 07/27/2019   BREAST RECONSTRUCTION Right 07/27/2022   Procedure: RIGHT NIPPLE REDUCTION;  Surgeon: Ebony Fellows, MD;  Location: [facility];  Service: Plastics;  Laterality: Right;   BREAST RECONSTRUCTION WITH PLACEMENT OF TISSUE EXPANDER AND ALLODERM Bilateral 12/07/2021   Procedure: BREAST RECONSTRUCTION WITH PLACEMENT OF TISSUE EXPANDER AND ALLODERM;  Surgeon: Ebony Fellows, MD;  Location: [facility];  Service: Plastics;  Laterality: Bilateral;   CESAREAN SECTION  11/14/2011   Procedure: CESAREAN SECTION;  Surgeon: Ebony Limbo, MD;  Location: [facility];  Service: Gynecology;  Laterality: N/A;  Primary cesarean section with delivery of baby boy at [phone]. Apgars 9/9.   CESAREAN SECTION N/A 03/22/2014   Procedure: Repeat CESAREAN SECTION;  Surgeon: Ebony Arthur, MD;  Location: [facility];  Service: Obstetrics;  Laterality: N/A;   COSMETIC SURGERY  11/22/2017   Abdominoplasty   FOOT SURGERY     FRACTURE SURGERY  08/1999   Repair of 5th metatarsal in left foot   HERNIA REPAIR  11/22/2017    NIPPLE SPARING MASTECTOMY Left 12/07/2021   Procedure: LEFT NIPPLE SPARING MASTECTOMY;  Surgeon: Ebony Loron, MD;  Location: [facility];  Service: General;  Laterality: Left;   NIPPLE SPARING MASTECTOMY WITH SENTINEL LYMPH NODE BIOPSY Right 12/07/2021   Procedure: RIGHT NIPPLE SPARING MASTECTOMY WITH AXILLARY SENTINEL LYMPH NODE BIOPSY;  Surgeon: Ebony Loron, MD;  Location: [facility];  Service: General;  Laterality: Right;   PORTACATH PLACEMENT N/A 06/24/2021   Procedure: INSERTION PORT-A-CATH;  Surgeon: Ebony Miyamoto, MD;  Location: [facility];  Service: General;  Laterality: N/A;   REMOVAL OF BILATERAL TISSUE EXPANDERS WITH PLACEMENT OF BILATERAL BREAST IMPLANTS Bilateral 07/27/2022   Procedure: REMOVAL OF BILATERAL TISSUE EXPANDERS WITH PLACEMENT OF BILATERAL BREAST IMPLANTS;  Surgeon: Ebony Fellows, MD;  Location: [facility];  Service: Plastics;  Laterality: Bilateral;   TUBAL LIGATION  11/22/2017   Tubes were completely removed   WISDOM TOOTH EXTRACTION     Patient Active Problem List   Diagnosis Date Noted   Palpitations 05/11/2023   Precordial pain 05/11/2023   Osteoporosis 10/11/2022   Vitamin D deficiency 10/11/2022   Genetic testing 06/17/2021   Family history of breast cancer 06/10/2021   Family history of ovarian cancer 06/10/2021   Family history of BRCA gene mutation 06/10/2021   Malignant neoplasm of upper-outer quadrant of right breast  in female, estrogen receptor positive (HCC) 06/08/2021   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/25/2014   Cesarean delivery delivered 03/22/2014   Pregnancy induced hypertension--with 1st pregnancy 12/27/2011   History of irregular menstrual bleeding 11/12/2011   History of eating disorder 11/12/2011   History of cardiac arrhythmia - PAC's - cardiac work-up normal 11/12/2011    PCP: Ebony Joe, MD  REFERRING PROVIDER: Lillard Anes,  NP  REFERRING DIAG: Diagnosis C50.411,Z17.0 (ICD-10-CM) - Malignant neoplasm of upper-outer quadrant of right breast in female, estrogen receptor positive (HCC)  THERAPY DIAG:  Aftercare following surgery for neoplasm  Malignant neoplasm of upper-outer quadrant of right breast in female, estrogen receptor positive (HCC)  Right shoulder pain, unspecified chronicity  ONSET DATE: 12/07/21  Rationale for Evaluation and Treatment: Rehabilitation  SUBJECTIVE:                                                                                                                                                                                           SUBJECTIVE STATEMENT:  I did end up getting the stomach bug.    EVAL: I noticed some swelling and pain in the Rt axilla and Ebony [facility] thought maybe I had some swelling in the arm.    PERTINENT HISTORY:Patient was diagnosed on 06/01/2021 with right grade III invasive ductal carcinoma breast cancer. She underwent neoadjuvant chemotherapy and then had bilateral mastectomies with a right sentinel node biopsy (4 negative nodes) on 12/07/2021. Now has implant reconstruction. 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  Completed radiation on the Rt side over expander.    PAIN:  Are you having pain? 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   Relieving factors: If I think about my posture  , opening stretching   PRECAUTIONS: None  RED FLAGS: None   WEIGHT BEARING RESTRICTIONS: No  FALLS:  Has patient fallen in last 6 months? No  LIVING ENVIRONMENT: Lives with: lives with their family, lives with their spouse, lives with their son, and lives with their daughter  OCCUPATION: Lawyer  LEISURE: Barre    HAND DOMINANCE: right   PRIOR LEVEL OF FUNCTION: Independent  PATIENT GOALS: See what is going on with the pain   OBJECTIVE: Note: Objective measures were completed at Evaluation unless otherwise noted.  COGNITION: Overall cognitive status: Within functional limits for tasks assessed   PALPATION: Cording noted  in axilla in supine D2 position and around 100deg of abduction in mid axilla and along pectoralis border   OBSERVATIONS / OTHER ASSESSMENTS: no sig edema noted just cording   POSTURE: gets worse with stress and work which increases pain  UPPER EXTREMITY AROM/PROM:  A/PROM baseline RIGHT   eval   Shoulder extension  40  Shoulder flexion 166 153  Shoulder abduction 168 160  Shoulder internal rotation    Shoulder external rotation 90 90    (Blank rows = not tested)  CERVICAL AROM: All within normal limits:   UPPER EXTREMITY STRENGTH: Not tested                                                                                                                           TREATMENT DATE:  12/14/23 Therapeutic Exercise: Wall ball flexion 5" x 5 Chest wall stretch single arm 2x20" Doorway Y 2x20" Supine horizontal abduction yellow x 10 and bil ER x 10  Manual Therapy Supine STM / cording release, cross hands pressure, S pressure with arm propped overhead on pillow and at around 110deg of abduction with straight arm.  12/09/23 Therapeutic Exercise: Wall ball flexion 5" x 5 Chest wall stretch single arm 2x20" Doorway Y 2x20" Manual Therapy Supine STM / cording release, cross hands pressure, S pressure with arm propped overhead on pillow and at around 110deg of abduction with straight arm.  12/07/23 Therapeutic Exercise: Showed tennis ball release on the wall Wall ball flexion 5" x 5 (abd easy) Chest wall stretch single arm 2x20" Doorway Y 2x20" Manual Therapy Supine STM / cording release, cross hands pressure, S pressure with arm propped overhead on pillow and at  around 110deg of abduction with straight arm.  11/30/23 Eval performed Cording noted in supine so started MT: Supine STM, cross hands pressure, S pressure with arm propped overhead on pillow and at around 110deg of abduction with straight arm. Education about cording throughout.  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  She will wear her compression garment for now when exercising. Discussed POC.  Discussion and a demo of stretches to include supine Y AROM and Y doorway stretch with pt performing Discussion of LDEX trends   PATIENT EDUCATION:  Education details: per today's note Person educated: Patient Education method: Explanation, Demonstration, Tactile cues, and Verbal cues Education comprehension: verbalized understanding and returned demonstration  HOME EXERCISE PROGRAM: Y stretch AROM and Y in doorway prolonged stretch, wear compression sleeve during exercise.   ASSESSMENT:  CLINICAL IMPRESSION: Continued POC with focus on Rt UE cording work.  3 small cords present  with the one along the pectoralis the most evident.  PROM is mostly full without pull or pain.   OBJECTIVE IMPAIRMENTS: decreased knowledge of condition, decreased ROM, and increased edema.   ACTIVITY LIMITATIONS: carrying and lifting  PARTICIPATION LIMITATIONS: none  PERSONAL FACTORS: 1-2 comorbidities: radiation, SLNB  are also affecting patient's functional outcome.   REHAB POTENTIAL: Excellent  CLINICAL DECISION MAKING: Stable/uncomplicated  EVALUATION COMPLEXITY: Low  GOALS: Goals reviewed with patient? Yes  SHORT TERM GOALS=LTGs: Target date: 12/28/23  Pt will return to baseline feelings of no pain in the Rt axilla  Baseline: 1/10 Goal status: INITIAL  2.  Pt will be ind with final HEP for continued stretching and postural strengthening  Baseline:  Goal status: INITIAL  PLAN:  PT FREQUENCY: 1-2x/week  PT DURATION: 4 weeks  PLANNED INTERVENTIONS: 97110-Therapeutic exercises,  97530- Therapeutic activity, 97535- Self Care, 16109- Manual therapy, Patient/Family education, Balance training, Dry Needling, Therapeutic exercises, Therapeutic activity, Neuromuscular re-education, Gait training, and Self Care  PLAN FOR NEXT SESSION: Rt cording release, assess scapula and strength here as able,   Tysha Grismore R, PT 12/14/2023, 9:00 AM

## 2023-12-16 ENCOUNTER — Ambulatory Visit: Admitting: Rehabilitation

## 2023-12-16 DIAGNOSIS — Z483 Aftercare following surgery for neoplasm: Secondary | ICD-10-CM

## 2023-12-16 DIAGNOSIS — Z9189 Other specified personal risk factors, not elsewhere classified: Secondary | ICD-10-CM

## 2023-12-16 DIAGNOSIS — Z170 Estrogen receptor positive status [ER+]: Secondary | ICD-10-CM

## 2023-12-16 DIAGNOSIS — M25511 Pain in right shoulder: Secondary | ICD-10-CM

## 2023-12-16 NOTE — Therapy (Signed)
 OUTPATIENT PHYSICAL THERAPY  UPPER EXTREMITY ONCOLOGY  Patient Name: KAMALJIT HIZER MRN: [id number] DOB:1976/06/10, 48 y.o., female Today's Date: 12/16/2023  END OF SESSION:  PT End of Session - 12/16/23 0956     Visit Number 5    Number of Visits 9    Date for PT Re-Evaluation 01/04/24    PT Start Time 0906    PT Stop Time 0956    PT Time Calculation (min) 50 min    Activity Tolerance Patient tolerated treatment well    Behavior During Therapy [facility] for tasks assessed/performed                Past Medical History:  Diagnosis Date   Abnormal Pap smear 09/13/2000   Allergy 02/2020   Seasonal   Breast cancer (HCC)    Depression    history of depression- denies currently   Hypertension    Port-A-Cath in place 08/10/2021   Preterm labor    Past Surgical History:  Procedure Laterality Date   BREAST BIOPSY Right 07/27/2019   BREAST RECONSTRUCTION Right 07/27/2022   Procedure: RIGHT NIPPLE REDUCTION;  Surgeon: Glenna Fellows, MD;  Location: [facility];  Service: Plastics;  Laterality: Right;   BREAST RECONSTRUCTION WITH PLACEMENT OF TISSUE EXPANDER AND ALLODERM Bilateral 12/07/2021   Procedure: BREAST RECONSTRUCTION WITH PLACEMENT OF TISSUE EXPANDER AND ALLODERM;  Surgeon: Glenna Fellows, MD;  Location: [facility];  Service: Plastics;  Laterality: Bilateral;   CESAREAN SECTION  11/14/2011   Procedure: CESAREAN SECTION;  Surgeon: Janine Limbo, MD;  Location: [facility];  Service: Gynecology;  Laterality: N/A;  Primary cesarean section with delivery of baby boy at [phone]. Apgars 9/9.   CESAREAN SECTION N/A 03/22/2014   Procedure: Repeat CESAREAN SECTION;  Surgeon: Esmeralda Arthur, MD;  Location: [facility];  Service: Obstetrics;  Laterality: N/A;   COSMETIC SURGERY  11/22/2017   Abdominoplasty   FOOT SURGERY     FRACTURE SURGERY  08/1999   Repair of 5th metatarsal in left foot   HERNIA REPAIR  11/22/2017   NIPPLE SPARING MASTECTOMY Left 12/07/2021    Procedure: LEFT NIPPLE SPARING MASTECTOMY;  Surgeon: Emelia Loron, MD;  Location: [facility];  Service: General;  Laterality: Left;   NIPPLE SPARING MASTECTOMY WITH SENTINEL LYMPH NODE BIOPSY Right 12/07/2021   Procedure: RIGHT NIPPLE SPARING MASTECTOMY WITH AXILLARY SENTINEL LYMPH NODE BIOPSY;  Surgeon: Emelia Loron, MD;  Location: [facility];  Service: General;  Laterality: Right;   PORTACATH PLACEMENT N/A 06/24/2021   Procedure: INSERTION PORT-A-CATH;  Surgeon: Abigail Miyamoto, MD;  [facility];  Service: General;  Laterality: N/A;   REMOVAL OF BILATERAL TISSUE EXPANDERS WITH PLACEMENT OF BILATERAL BREAST IMPLANTS Bilateral 07/27/2022   Procedure: REMOVAL OF BILATERAL TISSUE EXPANDERS WITH PLACEMENT OF BILATERAL BREAST IMPLANTS;  Surgeon: Glenna Fellows, MD;  [facility];  Service: Plastics;  Laterality: Bilateral;   TUBAL LIGATION  11/22/2017   Tubes were completely removed   WISDOM TOOTH EXTRACTION     Patient Active Problem List   Diagnosis Date Noted   Palpitations 05/11/2023   Precordial pain 05/11/2023   Osteoporosis 10/11/2022   Vitamin D deficiency 10/11/2022   Genetic testing 06/17/2021   Family history of breast cancer 06/10/2021   Family history of ovarian cancer 06/10/2021   Family history of BRCA gene mutation 06/10/2021   Malignant neoplasm of upper-outer quadrant of right breast in female, estrogen receptor positive (HCC) 06/08/2021  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/25/2014   Cesarean delivery delivered 03/22/2014   Pregnancy induced hypertension--with 1st pregnancy 12/27/2011   History of irregular menstrual bleeding 11/12/2011   History of eating disorder 11/12/2011   History of cardiac arrhythmia - PAC's - cardiac work-up normal 11/12/2011    PCP: Tally Joe, MD  REFERRING PROVIDER: Lillard Anes, NP  REFERRING DIAG:  Diagnosis C50.411,Z17.0 (ICD-10-CM) - Malignant neoplasm of upper-outer quadrant of right breast in female, estrogen receptor positive (HCC)  THERAPY DIAG:  Aftercare following surgery for neoplasm  Malignant neoplasm of upper-outer quadrant of right breast in female, estrogen receptor positive (HCC)  Right shoulder pain, unspecified chronicity  At risk for lymphedema  ONSET DATE: 12/07/21  Rationale for Evaluation and Treatment: Rehabilitation  SUBJECTIVE:                                                                                                                                                                                           SUBJECTIVE STATEMENT:  I've started to notice it getting a lot better.    EVAL: I noticed some swelling and pain in the Rt axilla and [name] thought maybe I had some swelling in the arm.    PERTINENT HISTORY:Patient was diagnosed on 06/01/2021 with right grade III invasive ductal carcinoma breast cancer. She underwent neoadjuvant chemotherapy and then had bilateral mastectomies with a right sentinel node biopsy (4 negative nodes) on 12/07/2021. Now has implant reconstruction. 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  Completed radiation on the Rt side over expander.    PAIN:  Are you having pain? 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   Relieving factors: If I think about my posture  , opening stretching   PRECAUTIONS: None  RED FLAGS: None   WEIGHT BEARING RESTRICTIONS: No  FALLS:  Has patient fallen in last 6 months? No  LIVING ENVIRONMENT: Lives with: lives with their family, lives with their spouse, lives with their son, and lives with their daughter  OCCUPATION: Lawyer  LEISURE:  Barre   HAND DOMINANCE: right   PRIOR LEVEL OF FUNCTION: Independent  PATIENT GOALS: See what is going on with the pain   OBJECTIVE: Note: Objective measures were completed at Evaluation unless otherwise noted.  COGNITION: Overall cognitive status: Within functional limits for tasks assessed   PALPATION: Cording noted in axilla in  supine D2 position and around 100deg of abduction in mid axilla and along pectoralis border   OBSERVATIONS / OTHER ASSESSMENTS: no sig edema noted just cording   POSTURE: gets worse with stress and work which increases pain  UPPER EXTREMITY AROM/PROM:  A/PROM baseline RIGHT   eval   Shoulder extension  40  Shoulder flexion 166 153  Shoulder abduction 168 160  Shoulder internal rotation    Shoulder external rotation 90 90    (Blank rows = not tested)  CERVICAL AROM: All within normal limits:   UPPER EXTREMITY STRENGTH: Not tested                                                                                                                           TREATMENT DATE:  12/16/23 Therapeutic Exercise: Wall ball flexion 5" x 5 Doorway Y 2x20" Supine over half foam roll: prolonged chest stretch 2x60", snow angel x 10, alternating flexion x 10 with Rt arm around 12" from the mat,  horizontal abduction x 10 with Rt arm just able to reach the table, reach towards heels alternating motion.  Manual Therapy Supine STM / cording release, cross hands pressure, S pressure with arm propped overhead on pillow and at around 110deg of abduction with straight arm.  12/14/23 Therapeutic Exercise: Wall ball flexion 5" x 5 Chest wall stretch single arm 2x20" Doorway Y 2x20" Supine horizontal abduction yellow x 10 and bil ER x 10  Manual Therapy Supine STM / cording release, cross hands pressure, S pressure with arm propped overhead on pillow and at around 110deg of abduction with straight arm.  12/09/23 Therapeutic Exercise: Wall ball flexion 5" x 5 Chest wall  stretch single arm 2x20" Doorway Y 2x20" Manual Therapy Supine STM / cording release, cross hands pressure, S pressure with arm propped overhead on pillow and at around 110deg of abduction with straight arm.  12/07/23 Therapeutic Exercise: Showed tennis ball release on the wall Wall ball flexion 5" x 5 (abd easy) Chest wall stretch single arm 2x20" Doorway Y 2x20" Manual Therapy Supine STM / cording release, cross hands pressure, S pressure with arm propped overhead on pillow and at around 110deg of abduction with straight arm.  11/30/23 Eval performed Cording noted in supine so started MT: Supine STM, cross hands pressure, S pressure with arm propped overhead on pillow and at around 110deg of abduction with straight arm. Education about cording throughout.  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  She will wear her compression garment for now when exercising. Discussed POC.  Discussion and a demo of stretches to include supine Y AROM and Y doorway stretch with pt performing Discussion of LDEX trends   PATIENT EDUCATION:  Education details: per today's note Person educated: Patient Education method: Explanation, Demonstration, Tactile cues, and Verbal cues Education comprehension: verbalized understanding and returned demonstration  HOME EXERCISE PROGRAM: Y stretch AROM and Y in doorway prolonged stretch, wear compression sleeve during exercise.  ASSESSMENT:  CLINICAL IMPRESSION: Continued POC with focus on Rt UE cording work. 1 cord along the pectoralis the most evident, the axillary ones were less noticeable.  PROM is mostly full without pull or pain. Limitations were more evident on foam roll.   OBJECTIVE IMPAIRMENTS: decreased knowledge of condition, decreased ROM, and increased edema.   ACTIVITY LIMITATIONS: carrying and lifting  PARTICIPATION LIMITATIONS: none  PERSONAL FACTORS: 1-2 comorbidities: radiation, SLNB  are also affecting patient's functional  outcome.   REHAB POTENTIAL: Excellent  CLINICAL DECISION MAKING: Stable/uncomplicated  EVALUATION COMPLEXITY: Low  GOALS: Goals reviewed with patient? Yes  SHORT TERM GOALS=LTGs: Target date: 12/28/23  Pt will return to baseline feelings of no pain in the Rt axilla  Baseline: 1/10 Goal status: INITIAL  2.  Pt will be ind with final HEP for continued stretching and postural strengthening  Baseline:  Goal status: INITIAL  PLAN:  PT FREQUENCY: 1-2x/week  PT DURATION: 4 weeks  PLANNED INTERVENTIONS: 97110-Therapeutic exercises, 97530- Therapeutic activity, 97535- Self Care, 16109- Manual therapy, Patient/Family education, Balance training, Dry Needling, Therapeutic exercises, Therapeutic activity, Neuromuscular re-education, Gait training, and Self Care  PLAN FOR NEXT SESSION: Rt cording release, assess scapula and strength here as able,   Idamae Lusher, PT 12/16/2023, 9:57 AM

## 2023-12-21 ENCOUNTER — Encounter: Payer: Self-pay | Admitting: Rehabilitation

## 2023-12-21 ENCOUNTER — Ambulatory Visit: Admitting: Rehabilitation

## 2023-12-21 DIAGNOSIS — Z170 Estrogen receptor positive status [ER+]: Secondary | ICD-10-CM

## 2023-12-21 DIAGNOSIS — Z483 Aftercare following surgery for neoplasm: Secondary | ICD-10-CM

## 2023-12-21 DIAGNOSIS — M25511 Pain in right shoulder: Secondary | ICD-10-CM

## 2023-12-21 DIAGNOSIS — Z9189 Other specified personal risk factors, not elsewhere classified: Secondary | ICD-10-CM

## 2023-12-21 NOTE — Therapy (Signed)
**Note Ebony-Identified via Obfuscation**  OUTPATIENT PHYSICAL THERAPY  UPPER EXTREMITY ONCOLOGY  Patient Name: Ebony Lamb MRN: [id number] DOB:December 03, 1975, 48 y.o., female Today's Date: 12/21/2023  END OF SESSION:  PT End of Session - 12/21/23 0852     Visit Number 6    Number of Visits 9    Date for PT Re-Evaluation 01/04/24    PT Start Time 0809    PT Stop Time 0852    PT Time Calculation (min) 43 min    Activity Tolerance Patient tolerated treatment well    Behavior During Therapy [facility] for tasks assessed/performed                 Past Medical History:  Diagnosis Date   Abnormal Pap smear 09/13/2000   Allergy 02/2020   Seasonal   Breast cancer (HCC)    Depression    history of depression- denies currently   Hypertension    Port-A-Cath in place 08/10/2021   Preterm labor    Past Surgical History:  Procedure Laterality Date   BREAST BIOPSY Right 07/27/2019   BREAST RECONSTRUCTION Right 07/27/2022   Procedure: RIGHT NIPPLE REDUCTION;  Surgeon: [facility]ony Fellows, MD;  Location: [facility];  Service: Plastics;  Laterality: Right;   BREAST RECONSTRUCTION WITH PLACEMENT OF TISSUE EXPANDER AND ALLODERM Bilateral 12/07/2021   Procedure: BREAST RECONSTRUCTION WITH PLACEMENT OF TISSUE EXPANDER AND ALLODERM;  Surgeon: Ebony Fellows, MD;  Location: [facility];  Service: Plastics;  Laterality: Bilateral;   CESAREAN SECTION  11/14/2011   Procedure: CESAREAN SECTION;  Surgeon: Janine Limbo, MD;  Location: [facility];  Service: Gynecology;  Laterality: N/A;  Primary cesarean section with delivery of baby boy at [phone]. Apgars 9/9.   CESAREAN SECTION N/A 03/22/2014   Procedure: Repeat CESAREAN SECTION;  Surgeon: Esmeralda Arthur, MD;  Location: [facility];  Service: Obstetrics;  Laterality: N/A;   COSMETIC SURGERY  11/22/2017   Abdominoplasty   FOOT SURGERY     FRACTURE SURGERY  08/1999   Repair of 5th metatarsal in left foot   HERNIA REPAIR  11/22/2017   NIPPLE SPARING MASTECTOMY Left 12/07/2021    Procedure: LEFT NIPPLE SPARING MASTECTOMY;  Surgeon: Emelia Loron, MD;  Location: [facility];  Service: General;  Laterality: Left;   NIPPLE SPARING MASTECTOMY WITH SENTINEL LYMPH NODE BIOPSY Right 12/07/2021   Procedure: RIGHT NIPPLE SPARING MASTECTOMY WITH AXILLARY SENTINEL LYMPH NODE BIOPSY;  Surgeon: Emelia Loron, MD;  Location: [facility];  Service: General;  Laterality: Right;   PORTACATH PLACEMENT N/A 06/24/2021   Procedure: INSERTION PORT-A-CATH;  Surgeon: Abigail Miyamoto, MD;  Location: [facility];  Service: General;  Laterality: N/A;   REMOVAL OF BILATERAL TISSUE EXPANDERS WITH PLACEMENT OF BILATERAL BREAST IMPLANTS Bilateral 07/27/2022   Procedure: REMOVAL OF BILATERAL TISSUE EXPANDERS WITH PLACEMENT OF BILATERAL BREAST IMPLANTS;  Surgeon: Eb[facility] Fellows, MD;  Location: [facility];  Service: Plastics;  Laterality: Bilateral;   TUBAL LIGATION  11/22/2017   Tubes were completely removed   WISDOM TOOTH EXTRACTION     Patient Active Problem List   Diagnosis Date Noted   Palpitations 05/11/2023   Precordial pain 05/11/2023   Osteoporosis 10/11/2022   Vitamin D deficiency 10/11/2022   Genetic testing 06/17/2021   Family history of breast cancer 06/10/2021   Family history of ovarian cancer 06/10/2021   Family history of BRCA gene mutation 06/10/2021   Malignant neoplasm of upper-outer quadrant of right breast in female, estrogen receptor positive (HCC) 06/08/2021  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/25/2014   Cesarean delivery delivered 03/22/2014   Pregnancy induced hypertension--with 1st pregnancy 12/27/2011   History of irregular menstrual bleeding 11/12/2011   History of eating disorder 11/12/2011   History of cardiac arrhythmia - PAC's - cardiac work-up normal 11/12/2011    PCP: Tally Joe, MD  REFERRING PROVIDER: Lillard Anes, NP  REFERRING DIAG:  Diagnosis C50.411,Z17.0 (ICD-10-CM) - Malignant neoplasm of upper-outer quadrant of right breast in female, estrogen receptor positive (HCC)  THERAPY DIAG:  Aftercare following surgery for neoplasm  Malignant neoplasm of upper-outer quadrant of right breast in female, estrogen receptor positive (HCC)  Right shoulder pain, unspecified chronicity  At risk for lymphedema  ONSET DATE: 12/07/21  Rationale for Evaluation and Treatment: Rehabilitation  SUBJECTIVE:                                                                                                                                                                                           SUBJECTIVE STATEMENT:  I did Barre on Monday and my chest is a little sore   EVAL: I noticed some swelling and pain in the Rt axilla and [facility] thought maybe I had some swelling in the arm.    PERTINENT HISTORY:Patient was diagnosed on 06/01/2021 with right grade III invasive ductal carcinoma breast cancer. She underwent neoadjuvant chemotherapy and then had bilateral mastectomies with a right sentinel node biopsy (4 negative nodes) on 12/07/2021. Now has implant reconstruction. 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  Completed radiation on the Rt side over expander.    PAIN:  Are you having pain? Yes NPRS scale: 2/10 Pain location: Rt axilla and maybe into the Rt UT and shoulder blade - does have a hx of UT/Shoulder blade pain Pain orientation: Right  PAIN TYPE: aching and tight Pain description: intermittent  Aggravating factors: I notice it at work and with mouse work.   Relieving factors: If I think about my posture  , opening stretching   PRECAUTIONS: None  RED FLAGS: None   WEIGHT BEARING RESTRICTIONS: No  FALLS:  Has patient fallen in last 6 months? No  LIVING ENVIRONMENT: Lives with: lives with their family, lives with their spouse, lives with their son, and lives with their daughter  OCCUPATION:  Lawyer  LEISURE: Barre   HAND DOMINANCE: right   PRIOR LEVEL OF FUNCTION: Independent  PATIENT GOALS: See what is going on with the pain   OBJECTIVE: Note: Objective measures were completed at Evaluation unless otherwise noted.  COGNITION: Overall cognitive status: Within functional limits for tasks assessed   PALPATION: Cording noted in  axilla in supine D2 position and around 100deg of abduction in mid axilla and along pectoralis border   OBSERVATIONS / OTHER ASSESSMENTS: no sig edema noted just cording   POSTURE: gets worse with stress and work which increases pain  UPPER EXTREMITY AROM/PROM:  A/PROM baseline RIGHT   eval   Shoulder extension  40  Shoulder flexion 166 153  Shoulder abduction 168 160  Shoulder internal rotation    Shoulder external rotation 90 90    (Blank rows = not tested)  CERVICAL AROM: All within normal limits:   UPPER EXTREMITY STRENGTH: Not tested                                                                                                                           TREATMENT DATE:  12/21/23 Therapeutic Exercise: Wall ball flexion 5" x 5 Doorway Y 2x20" Thoracic extension over foam roll Supine over half foam roll: prolonged chest stretch 2x60", snow angel x 10, alternating flexion x 10 with Rt arm around 12" from the mat,  horizontal abduction x 10 with Rt arm just able to reach the table, reach towards heels alternating motion, pro/ret Manual Therapy Supine STM / cording release, cross hands pressure, S pressure with arm propped overhead on pillow and at around 110deg of abduction with straight arm.  12/16/23 Therapeutic Exercise: Wall ball flexion 5" x 5 Doorway Y 2x20" Supine over half foam roll: prolonged chest stretch 2x60", snow angel x 10, alternating flexion x 10 with Rt arm around 12" from the mat,  horizontal abduction x 10 with Rt arm just able to reach the table, reach towards heels alternating motion.  Manual Therapy Supine  STM / cording release, cross hands pressure, S pressure with arm propped overhead on pillow and at around 110deg of abduction with straight arm.  12/14/23 Therapeutic Exercise: Wall ball flexion 5" x 5 Chest wall stretch single arm 2x20" Doorway Y 2x20" Supine horizontal abduction yellow x 10 and bil ER x 10  Manual Therapy Supine STM / cording release, cross hands pressure, S pressure with arm propped overhead on pillow and at around 110deg of abduction with straight arm.  12/09/23 Therapeutic Exercise: Wall ball flexion 5" x 5 Chest wall stretch single arm 2x20" Doorway Y 2x20" Manual Therapy Supine STM / cording release, cross hands pressure, S pressure with arm propped overhead on pillow and at around 110deg of abduction with straight arm.  12/07/23 Therapeutic Exercise: Showed tennis ball release on the wall Wall ball flexion 5" x 5 (abd easy) Chest wall stretch single arm 2x20" Doorway Y 2x20" Manual Therapy Supine STM / cording release, cross hands pressure, S pressure with arm propped overhead on pillow and at around 110deg of abduction with straight arm.  11/30/23 Eval performed Cording noted in supine so started MT: Supine STM, cross hands pressure, S pressure with arm propped overhead on pillow and at around 110deg of abduction with straight arm. Education about cording throughout.  Discussed  exercise as pt does barre with 10-15 lbs.  She will wear her compression garment for now when exercising. Discussed POC.  Discussion and a demo of stretches to include supine Y AROM and Y doorway stretch with pt performing Discussion of LDEX trends   PATIENT EDUCATION:  Education details: per today's note Person educated: Patient Education method: Explanation, Demonstration, Tactile cues, and Verbal cues Education comprehension: verbalized understanding and returned demonstration  HOME EXERCISE PROGRAM: Y stretch AROM and Y in doorway prolonged stretch, wear compression sleeve  during exercise.   ASSESSMENT:  CLINICAL IMPRESSION: Continued POC with focus on Rt UE cording work. 1 cord along the pectoralis the most evident, the axillary ones were less noticeable.  Pt will think about what she wants to do for POC DC vs 1x per week, etc.   OBJECTIVE IMPAIRMENTS: decreased knowledge of condition, decreased ROM, and increased edema.   ACTIVITY LIMITATIONS: carrying and lifting  PARTICIPATION LIMITATIONS: none  PERSONAL FACTORS: 1-2 comorbidities: radiation, SLNB  are also affecting patient's functional outcome.   REHAB POTENTIAL: Excellent  CLINICAL DECISION MAKING: Stable/uncomplicated  EVALUATION COMPLEXITY: Low  GOALS: Goals reviewed with patient? Yes  SHORT TERM GOALS=LTGs: Target date: 12/28/23  Pt will return to baseline feelings of no pain in the Rt axilla  Baseline: 1/10 Goal status: INITIAL  2.  Pt will be ind with final HEP for continued stretching and postural strengthening  Baseline:  Goal status: INITIAL  PLAN:  PT FREQUENCY: 1-2x/week  PT DURATION: 4 weeks  PLANNED INTERVENTIONS: 97110-Therapeutic exercises, 97530- Therapeutic activity, 97535- Self Care, [id number]- Manual therapy, Patient/Family education, Balance training, Dry Needling, Therapeutic exercises, Therapeutic activity, Neuromuscular re-education, Gait training, and Self Care  PLAN FOR NEXT SESSION: Rt cording release, assess scapula and strength here as able,   Idamae Lusher, PT 12/21/2023, 8:52 AM

## 2023-12-23 ENCOUNTER — Encounter: Payer: Self-pay | Admitting: Rehabilitation

## 2023-12-23 ENCOUNTER — Ambulatory Visit: Admitting: Rehabilitation

## 2023-12-23 DIAGNOSIS — Z9189 Other specified personal risk factors, not elsewhere classified: Secondary | ICD-10-CM

## 2023-12-23 DIAGNOSIS — Z483 Aftercare following surgery for neoplasm: Secondary | ICD-10-CM | POA: Diagnosis not present

## 2023-12-23 DIAGNOSIS — M25511 Pain in right shoulder: Secondary | ICD-10-CM

## 2023-12-23 DIAGNOSIS — Z170 Estrogen receptor positive status [ER+]: Secondary | ICD-10-CM

## 2023-12-23 NOTE — Therapy (Signed)
 OUTPATIENT PHYSICAL THERAPY  UPPER EXTREMITY ONCOLOGY  Patient Name: [facility] MRN: [id number] DOB:[birth year], 48 y.o., female Today's Date: 12/23/2023  END OF SESSION:  PT End of Session - 12/23/23 0804     Visit Number 7    Number of Visits 10    Date for PT Re-Evaluation 01/20/24    PT Start Time 0806    PT Stop Time 0850    PT Time Calculation (min) 44 min    Activity Tolerance Patient tolerated treatment well    Behavior During Therapy [facility] for tasks assessed/performed                 Past Medical History:  Diagnosis Date   Abnormal Pap smear 09/13/2000   Allergy 02/2020   Seasonal   Breast cancer (HCC)    Depression    history of depression- denies currently   Hypertension    Port-A-Cath in place 08/10/2021   Preterm labor    Past Surgical History:  Procedure Laterality Date   BREAST BIOPSY Right 07/27/2019   BREAST RECONSTRUCTION Right 07/27/2022   Procedure: RIGHT NIPPLE REDUCTION;  Surgeon: [facility] Fellows, MD;  Location: [facility];  Service: Plastics;  Laterality: Right;   BREAST RECONSTRUCTION WITH PLACEMENT OF TISSUE EXPANDER AND ALLODERM Bilateral 12/07/2021   Procedure: BREAST RECONSTRUCTION WITH PLACEMENT OF TISSUE EXPANDER AND ALLODERM;  Surgeon: [facility] Fellows, MD;  Location: [facility];  Service: Plastics;  Laterality: Bilateral;   CESAREAN SECTION  11/14/2011   Procedure: CESAREAN SECTION;  Surgeon: [facility] Limbo, MD;  Location: [facility];  Service: Gynecology;  Laterality: N/A;  Primary cesarean section with delivery of baby boy at [phone]. Apgars 9/9.   CESAREAN SECTION N/A 03/22/2014   Procedure: Repeat CESAREAN SECTION;  Surgeon: [facility] Arthur, MD;  Location: [facility];  Service: Obstetrics;  Laterality: N/A;   COSMETIC SURGERY  11/22/2017   Abdominoplasty   FOOT SURGERY     FRACTURE SURGERY  08/1999   Repair of 5th metatarsal in left foot   HERNIA REPAIR  11/22/2017   NIPPLE SPARING MASTECTOMY Left  12/07/2021   Procedure: LEFT NIPPLE SPARING MASTECTOMY;  Surgeon: [facility]ony Loron, MD;  Location: [facility];  Service: General;  Laterality: Left;   NIPPLE SPARING MASTECTOMY WITH SENTINEL LYMPH NODE BIOPSY Right 12/07/2021   Procedure: RIGHT NIPPLE SPARING MASTECTOMY WITH AXILLARY SENTINEL LYMPH NODE BIOPSY;  Surgeon: [facility]ony Loron, MD;  Location: [facility];  Service: General;  Laterality: Right;   PORTACATH PLACEMENT N/A 06/24/2021   Procedure: INSERTION PORT-A-CATH;  Surgeon: [facility]ony Miyamoto, MD;  Location: [facility];  Service: General;  Laterality: N/A;   REMOVAL OF BILATERAL TISSUE EXPANDERS WITH PLACEMENT OF BILATERAL BREAST IMPLANTS Bilateral 07/27/2022   Procedure: REMOVAL OF BILATERAL TISSUE EXPANDERS WITH PLACEMENT OF BILATERAL BREAST IMPLANTS;  Surgeon: [facility] Fellows, MD;  Location: [facility];  Service: Plastics;  Laterality: Bilateral;   TUBAL LIGATION  11/22/2017   Tubes were completely removed   WISDOM TOOTH EXTRACTION     Patient Active Problem List   Diagnosis Date Noted   Palpitations 05/11/2023   Precordial pain 05/11/2023   Osteoporosis 10/11/2022   Vitamin D deficiency 10/11/2022   Genetic testing 06/17/2021   Family history of breast cancer 06/10/2021   Family history of ovarian cancer 06/10/2021   Family history of BRCA gene mutation 06/10/2021   Malignant neoplasm of upper-outer quadrant of right breast in female, estrogen receptor positive (HCC) 06/08/2021  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/25/2014   Cesarean delivery delivered 03/22/2014   Pregnancy induced hypertension--with 1st pregnancy 12/27/2011   History of irregular menstrual bleeding 11/12/2011   History of eating disorder 11/12/2011   History of cardiac arrhythmia - PAC's - cardiac work-up normal 11/12/2011    PCP: [facility] Joe, MD  REFERRING PROVIDER: Lillard Anes, NP  REFERRING DIAG:  Diagnosis C50.411,Z17.0 (ICD-10-CM) - Malignant neoplasm of upper-outer quadrant of right breast in female, estrogen receptor positive (HCC)  THERAPY DIAG:  Aftercare following surgery for neoplasm  Malignant neoplasm of upper-outer quadrant of right breast in female, estrogen receptor positive (HCC)  Right shoulder pain, unspecified chronicity  At risk for lymphedema  ONSET DATE: 12/07/21  Rationale for Evaluation and Treatment: Rehabilitation  SUBJECTIVE:                                                                                                                                                                                           SUBJECTIVE STATEMENT:  I did Barre on Monday and my chest is a little sore   EVAL: I noticed some swelling and pain in the Rt axilla and [facility] thought maybe I had some swelling in the arm.    PERTINENT HISTORY:Patient was diagnosed on 06/01/2021 with right grade III invasive ductal carcinoma breast cancer. She underwent neoadjuvant chemotherapy and then had bilateral mastectomies with a right sentinel node biopsy (4 negative nodes) on 12/07/2021. Now has implant reconstruction. 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  Completed radiation on the Rt side over expander.    PAIN:  Are you having pain? 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   Relieving factors: If I think about my posture  , opening stretching   PRECAUTIONS: None  RED FLAGS: None   WEIGHT BEARING RESTRICTIONS: No  FALLS:  Has patient fallen in last 6 months? No  LIVING ENVIRONMENT: Lives with: lives with their family, lives with their spouse, lives with their son, and lives with their daughter  OCCUPATION:  Lawyer  LEISURE: Barre   HAND DOMINANCE: right   PRIOR LEVEL OF FUNCTION: Independent  PATIENT GOALS: See what is going on with the pain   OBJECTIVE: Note: Objective measures were completed at Evaluation unless otherwise noted.  COGNITION: Overall cognitive status: Within functional limits for tasks assessed   PALPATION: Cording noted in  axilla in supine D2 position and around 100deg of abduction in mid axilla and along pectoralis border   OBSERVATIONS / OTHER ASSESSMENTS: no sig edema noted just cording   POSTURE: gets worse with stress and work which increases pain  UPPER EXTREMITY AROM/PROM:  A/PROM baseline RIGHT   eval   Shoulder extension  40  Shoulder flexion 166 153  Shoulder abduction 168 160  Shoulder internal rotation    Shoulder external rotation 90 90    (Blank rows = not tested)  CERVICAL AROM: All within normal limits:   UPPER EXTREMITY STRENGTH: Not tested                                                                                                                           TREATMENT DATE:  12/23/23 Therapeutic Exercise: Wall ball flexion 5" x 5 Doorway Y 3x20" Supine over half foam roll: prolonged chest stretch 2x60", snow angel x 10, alternating flexion x 10 with Rt arm around 12" from the mat,  horizontal abduction x 10 with Rt arm just able to reach the table, reach towards heels alternating motion, pro/ret Manual Therapy Supine STM / cording release, cross hands pressure, S pressure with arm propped overhead on pillow and at around 110deg of abduction with straight arm.  12/21/23 Therapeutic Exercise: Wall ball flexion 5" x 5 Doorway Y 2x20" Thoracic extension over foam roll Supine over half foam roll: prolonged chest stretch 2x60", snow angel x 10, alternating flexion x 10 with Rt arm around 12" from the mat,  horizontal abduction x 10 with Rt arm just able to reach the table, reach towards heels alternating motion, pro/ret Manual  Therapy Supine STM / cording release, cross hands pressure, S pressure with arm propped overhead on pillow and at around 110deg of abduction with straight arm.  12/16/23 Therapeutic Exercise: Wall ball flexion 5" x 5 Doorway Y 2x20" Supine over half foam roll: prolonged chest stretch 2x60", snow angel x 10, alternating flexion x 10 with Rt arm around 12" from the mat,  horizontal abduction x 10 with Rt arm just able to reach the table, reach towards heels alternating motion.  Manual Therapy Supine STM / cording release, cross hands pressure, S pressure with arm propped overhead on pillow and at around 110deg of abduction with straight arm.  PATIENT EDUCATION:  Education details: per today's note Person educated: Patient Education method: Explanation, Demonstration, Tactile cues, and Verbal cues Education comprehension: verbalized understanding and returned demonstration  HOME EXERCISE PROGRAM: Y stretch AROM and Y in doorway prolonged stretch, wear compression sleeve during exercise.   ASSESSMENT:  CLINICAL IMPRESSION: Continued POC with focus on Rt UE cording work. Axillary pain that had increased last time is back down.  Decided to decrease to 1x per week x 3 more visits.     OBJECTIVE IMPAIRMENTS: decreased knowledge of condition, decreased ROM, and increased edema.   ACTIVITY LIMITATIONS: carrying and lifting  PARTICIPATION LIMITATIONS: none  PERSONAL FACTORS: 1-2  comorbidities: radiation, SLNB  are also affecting patient's functional outcome.   REHAB POTENTIAL: Excellent  CLINICAL DECISION MAKING: Stable/uncomplicated  EVALUATION COMPLEXITY: Low  GOALS: Goals reviewed with patient? Yes  SHORT TERM GOALS=LTGs: Target date: 01/20/24  Pt will return to baseline feelings of no pain in the Rt axilla  Baseline: 1/10 Goal status: ONGOING -  still staying at 1/10  2.  Pt will be ind with final HEP for continued stretching and postural strengthening  Baseline:  Goal status:  ONGOING  PLAN:  PT FREQUENCY: 1-2x/week  PT DURATION: 4 weeks  PLANNED INTERVENTIONS: 97110-Therapeutic exercises, 97530- Therapeutic activity, 97535- Self Care, [id number]- Manual therapy, Patient/Family education, Balance training, Dry Needling, Therapeutic exercises, Therapeutic activity, Neuromuscular re-education, Gait training, and Self Care  PLAN FOR NEXT SESSION: Rt cording release, assess scapula and strength here as able,   Idamae Lusher, PT 12/23/2023, 9:53 AM

## 2023-12-30 LAB — SIGNATERA
SIGNATERA MTM READOUT: 0 MTM/ml
SIGNATERA TEST RESULT: NEGATIVE

## 2024-01-06 ENCOUNTER — Ambulatory Visit: Admitting: Rehabilitation

## 2024-01-06 ENCOUNTER — Encounter: Payer: Self-pay | Admitting: Rehabilitation

## 2024-01-06 DIAGNOSIS — M25611 Stiffness of right shoulder, not elsewhere classified: Secondary | ICD-10-CM

## 2024-01-06 DIAGNOSIS — M25511 Pain in right shoulder: Secondary | ICD-10-CM

## 2024-01-06 DIAGNOSIS — Z9189 Other specified personal risk factors, not elsewhere classified: Secondary | ICD-10-CM

## 2024-01-06 DIAGNOSIS — Z170 Estrogen receptor positive status [ER+]: Secondary | ICD-10-CM

## 2024-01-06 DIAGNOSIS — M25612 Stiffness of left shoulder, not elsewhere classified: Secondary | ICD-10-CM

## 2024-01-06 DIAGNOSIS — Z483 Aftercare following surgery for neoplasm: Secondary | ICD-10-CM

## 2024-01-06 DIAGNOSIS — R293 Abnormal posture: Secondary | ICD-10-CM

## 2024-01-06 NOTE — Therapy (Signed)
 OUTPATIENT PHYSICAL THERAPY  UPPER EXTREMITY ONCOLOGY  Patient Name: Ebony Lamb MRN: [id number] DOB:08-22-1976, 48 y.o., female Today's Date: 01/06/2024  END OF SESSION:  PT End of Session - 01/06/24 0814     Visit Number 8    Number of Visits 10    Date for PT Re-Evaluation 01/20/24    PT Start Time 0807    PT Stop Time 0854    PT Time Calculation (min) 47 min    Activity Tolerance Patient tolerated treatment well    Behavior During Therapy [facility] for tasks assessed/performed                  Past Medical History:  Diagnosis Date   Abnormal Pap smear 09/13/2000   Allergy 02/2020   Seasonal   Breast cancer (HCC)    Depression    history of depression- denies currently   Hypertension    Port-A-Cath in place 08/10/2021   Preterm labor    Past Surgical History:  Procedure Laterality Date   BREAST BIOPSY Right 07/27/2019   BREAST RECONSTRUCTION Right 07/27/2022   Procedure: RIGHT NIPPLE REDUCTION;  Surgeon: Alger Infield, MD;  Location: [facility];  Service: Plastics;  Laterality: Right;   BREAST RECONSTRUCTION WITH PLACEMENT OF TISSUE EXPANDER AND ALLODERM Bilateral 12/07/2021   Procedure: BREAST RECONSTRUCTION WITH PLACEMENT OF TISSUE EXPANDER AND ALLODERM;  Surgeon: Alger Infield, MD;  Location: [facility];  Service: Plastics;  Laterality: Bilateral;   CESAREAN SECTION  11/14/2011   Procedure: CESAREAN SECTION;  Surgeon: Anselmo Kings, MD;  Location: [facility];  Service: Gynecology;  Laterality: N/A;  Primary cesarean section with delivery of baby boy at [phone]. Apgars 9/9.   CESAREAN SECTION N/A 03/22/2014   Procedure: Repeat CESAREAN SECTION;  Surgeon: Mckinley Spells, MD;  Location: [facility];  Service: Obstetrics;  Laterality: N/A;   COSMETIC SURGERY  11/22/2017   Abdominoplasty   FOOT SURGERY     FRACTURE SURGERY  08/1999   Repair of 5th metatarsal in left foot   HERNIA REPAIR  11/22/2017   NIPPLE SPARING MASTECTOMY Left  12/07/2021   Procedure: LEFT NIPPLE SPARING MASTECTOMY;  Surgeon: Enid Harry, MD;  Location: [facility];  Service: General;  Laterality: Left;   NIPPLE SPARING MASTECTOMY WITH SENTINEL LYMPH NODE BIOPSY Right 12/07/2021   Procedure: RIGHT NIPPLE SPARING MASTECTOMY WITH AXILLARY SENTINEL LYMPH NODE BIOPSY;  Surgeon: Enid Harry, MD;  Location: [facility];  Service: General;  Laterality: Right;   PORTACATH PLACEMENT N/A 06/24/2021   Procedure: INSERTION PORT-A-CATH;  Surgeon: Oza Blumenthal, MD;  Location: [facility];  Service: General;  Laterality: N/A;   REMOVAL OF BILATERAL TISSUE EXPANDERS WITH PLACEMENT OF BILATERAL BREAST IMPLANTS Bilateral 07/27/2022   Procedure: REMOVAL OF BILATERAL TISSUE EXPANDERS WITH PLACEMENT OF BILATERAL BREAST IMPLANTS;  Surgeon: Alger Infield, MD;  Location:  [facility];  Service: Plastics;  Laterality: Bilateral;   TUBAL LIGATION  11/22/2017   Tubes were completely removed   WISDOM TOOTH EXTRACTION     Patient Active Problem List   Diagnosis Date Noted   Palpitations 05/11/2023   Precordial pain 05/11/2023   Osteoporosis 10/11/2022   Vitamin D deficiency 10/11/2022   Genetic testing 06/17/2021   Family history of breast cancer 06/10/2021   Family history of ovarian cancer 06/10/2021   Family history of BRCA gene mutation 06/10/2021   Malignant neoplasm of upper-outer quadrant of right breast in female, estrogen receptor positive (HCC) 06/08/2021  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/25/2014   Cesarean delivery delivered 03/22/2014   Pregnancy induced hypertension--with 1st pregnancy 12/27/2011   History of irregular menstrual bleeding 11/12/2011   History of eating disorder 11/12/2011   History of cardiac arrhythmia - PAC's - cardiac work-up normal 11/12/2011    PCP: Rae Bugler, MD  REFERRING PROVIDER: Alwin Baars, NP  REFERRING DIAG:  Diagnosis C50.411,Z17.0 (ICD-10-CM) - Malignant neoplasm of upper-outer quadrant of right breast in female, estrogen receptor positive (HCC)  THERAPY DIAG:  Aftercare following surgery for neoplasm  Malignant neoplasm of upper-outer quadrant of right breast in female, estrogen receptor positive (HCC)  Right shoulder pain, unspecified chronicity  At risk for lymphedema  Abnormal posture  Stiffness of left shoulder, not elsewhere classified  Stiffness of right shoulder, not elsewhere classified  ONSET DATE: 12/07/21  Rationale for Evaluation and Treatment: Rehabilitation  SUBJECTIVE:                                                                                                                                                                                           SUBJECTIVE STATEMENT:  I didn't notice anything over spring break.  I went back to [employer] again as well.    EVAL: I noticed some swelling and pain in the Rt axilla and [name] thought maybe I had some swelling in the arm.    PERTINENT HISTORY:Patient was diagnosed on 06/01/2021 with right grade III invasive ductal carcinoma breast cancer. She underwent neoadjuvant chemotherapy and then had bilateral mastectomies with a right sentinel node biopsy (4 negative nodes) on 12/07/2021. Now has implant reconstruction. 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  Completed radiation on the Rt side over expander.    PAIN:  Are you having pain? No   PRECAUTIONS: None  RED FLAGS: None   WEIGHT BEARING RESTRICTIONS: No  FALLS:  Has patient fallen in last 6 months? No  LIVING ENVIRONMENT: Lives with: lives with their family, lives with their spouse, lives with their son, and lives with their daughter  OCCUPATION: Lawyer  LEISURE: [place]   HAND DOMINANCE: right   PRIOR LEVEL OF FUNCTION: Independent  PATIENT GOALS: See what is going on with the pain   OBJECTIVE: Note: Objective measures were completed at  Evaluation unless otherwise noted.  COGNITION: Overall cognitive status: Within functional limits for tasks assessed   PALPATION: Cording noted in axilla in supine D2 position and around 100deg of abduction in mid axilla and along pectoralis border   OBSERVATIONS / OTHER ASSESSMENTS: no sig edema noted just cording   POSTURE: gets worse with stress and work which  increases pain  UPPER EXTREMITY AROM/PROM:  A/PROM baseline RIGHT   eval   Shoulder extension  40  Shoulder flexion 166 153  Shoulder abduction 168 160  Shoulder internal rotation    Shoulder external rotation 90 90    (Blank rows = not tested)  CERVICAL AROM: All within normal limits:   UPPER EXTREMITY STRENGTH: Not tested                                                                                                                           TREATMENT DATE:  01/06/24 Therapeutic Exercise: Wall ball flexion 5" x 5 Doorway Y 3x20" Supine over half foam roll: prolonged chest stretch 2x60", snow angel x 10, alternating flexion x 10 with Rt arm around 12" from the mat,  horizontal abduction x 10 with Rt arm just able to reach the table, reach towards heels alternating motion, pro/ret Manual Therapy Supine STM / cording release, cross hands pressure, S pressure with arm propped overhead on pillow and at around 110deg of abduction with straight arm.  12/23/23 Therapeutic Exercise: Wall ball flexion 5" x 5 Doorway Y 3x20" Supine over half foam roll: prolonged chest stretch 2x60", snow angel x 10, alternating flexion x 10 with Rt arm around 12" from the mat,  horizontal abduction x 10 with Rt arm just able to reach the table, reach towards heels alternating motion, pro/ret Manual Therapy Supine STM / cording release, cross hands pressure, S pressure with arm propped overhead on pillow and at around 110deg of abduction with straight arm.  12/21/23 Therapeutic Exercise: Wall ball flexion 5" x 5 Doorway Y 2x20" Thoracic  extension over foam roll Supine over half foam roll: prolonged chest stretch 2x60", snow angel x 10, alternating flexion x 10 with Rt arm around 12" from the mat,  horizontal abduction x 10 with Rt arm just able to reach the table, reach towards heels alternating motion, pro/ret Manual Therapy Supine STM / cording release, cross hands pressure, S pressure with arm propped overhead on pillow and at around 110deg of abduction with straight arm.  12/16/23 Therapeutic Exercise: Wall ball flexion 5" x 5 Doorway Y 2x20" Supine over half foam roll: prolonged chest stretch 2x60", snow angel x 10, alternating flexion x 10 with Rt arm around 12" from the mat,  horizontal abduction x 10 with Rt arm just able to reach the table, reach towards heels alternating motion.  Manual Therapy Supine STM / cording release, cross hands pressure, S pressure with arm propped overhead on pillow and at around 110deg of abduction with straight arm.  PATIENT EDUCATION:  Education details: per today's note Person educated: Patient Education method: Explanation, Demonstration, Tactile cues, and Verbal cues Education comprehension: verbalized understanding and returned demonstration  HOME EXERCISE PROGRAM: Y stretch AROM and Y in doorway prolonged stretch, wear compression sleeve during exercise.   ASSESSMENT:  CLINICAL IMPRESSION: Continued POC with focus on Rt UE cording work. Goals are all met at this  time and pt will consider coming to the last visit or cancelling and will let us  know.    OBJECTIVE IMPAIRMENTS: decreased knowledge of condition, decreased ROM, and increased edema.   ACTIVITY LIMITATIONS: carrying and lifting  PARTICIPATION LIMITATIONS: none  PERSONAL FACTORS: 1-2 comorbidities: radiation, SLNB  are also affecting patient's functional outcome.   REHAB POTENTIAL: Excellent  CLINICAL DECISION MAKING: Stable/uncomplicated  EVALUATION COMPLEXITY: Low  GOALS: Goals reviewed with patient?  Yes  SHORT TERM GOALS=LTGs: Target date: 01/20/24  Pt will return to baseline feelings of no pain in the Rt axilla  Baseline: 1/10 Goal status: MET 01/06/24  2.  Pt will be ind with final HEP for continued stretching and postural strengthening  Baseline:  Goal status: MET  PLAN:  PT FREQUENCY: 1-2x/week  PT DURATION: 4 weeks  PLANNED INTERVENTIONS: 97110-Therapeutic exercises, 97530- Therapeutic activity, 97535- Self Care, 16109- Manual therapy, Patient/Family education, Balance training, Dry Needling, Therapeutic exercises, Therapeutic activity, Neuromuscular re-education, Gait training, and Self Care  PLAN FOR NEXT SESSION: Rt cording release, assess scapula and strength here as able,   Encarnacion Harris, PT 01/06/2024, 8:54 AM

## 2024-01-11 ENCOUNTER — Encounter: Admitting: Rehabilitation

## 2024-01-11 ENCOUNTER — Other Ambulatory Visit

## 2024-01-16 ENCOUNTER — Ambulatory Visit: Admitting: "Endocrinology

## 2024-01-16 ENCOUNTER — Encounter: Payer: Self-pay | Admitting: "Endocrinology

## 2024-01-16 VITALS — BP 110/70 | HR 71 | Ht 66.0 in | Wt 135.0 lb

## 2024-01-16 DIAGNOSIS — M816 Localized osteoporosis [Lequesne]: Secondary | ICD-10-CM | POA: Diagnosis not present

## 2024-01-16 NOTE — Progress Notes (Signed)
 [facility] Note Jorge Newcomer, MD    Referring Provider: Cameron Cea, MD Primary Care Provider: Rae Bugler, MD Chief Complaint  Patient presents with   Establish Care     Assessment & Plan  Ebony Lamb was seen today for establish care.  Diagnoses and all orders for this visit:  Localized osteoporosis without current pathological fracture -     VITAMIN D 25 Hydroxy (Vit-D Deficiency, Fractures) -     Renal function panel    Osteoporosis, diagnosed in 09/2023 with  L1-L4 T score of  -2.7 Likely secondary cause from chemotherapy.   Recommend to use calcium 600 mg twice daily and vitamin D 2000 units OTC supplements.   Continue weight bearing exercise options and dietary supplements. Patient is reserved and would like not to be treated, discussed pros and cons of treatment at length  Educated on risks and side effects of fosamax including but not limited to esophagitis, worsening GERD, atypical femoral fractures and osteonecrosis of the jaw. Advised to take medication first thing in the morning with plenty of water and stay upright for 30 minutes after taking the medication.  Also discussed  other antiresorptive/anabolic medications, including Reclast and Prolia.  Follow fall precautions, adequate dairy in diet and exercises (aerobic, balancing and weight bearing) as tolerated.   Return in about 3 months (around 04/17/2024) for visit + labs before next visit, labs today.  I have reviewed current medications, nurse's notes, allergies, vital signs, past medical and surgical history, family medical history, and social history for this encounter. Counseled patient on symptoms, examination findings, lab findings, imaging results, treatment decisions and monitoring and prognosis. The patient understood the recommendations and agrees with the treatment plan. All questions regarding treatment plan were fully answered.   Jorge Newcomer, MD   01/16/24    History of Present  Illness Ebony Lamb is a 48 y.o. year old female who presents to our clinic with osteoporosis diagnosed in 09/2023 with  L1-L4 T score of  -2.7. She is currently taking Calcium and vitamin D, dose unknown.  Never been treated for osteoporosis.   Got menopause at 60 after chemotherapy for breast cancer. Was put on tamoxifen    09/2023 DEXA scan, reviewed images and report Site Region Measured Date Measured Age YA BMD Significant CHANGE T-score AP Spine  L1-L4       09/20/2023    46.8         -2.7    0.866 g/cm2 AP Spine  L1-L4       10/05/2022    45.8         -2.9    0.842 g/cm2   DualFemur Total Right 09/20/2023    46.8         -1.9    0.774 g/cm2 DualFemur Total Right 10/05/2022    45.8         -1.9    0.770 g/cm2   DualFemur Total Mean  09/20/2023    46.8         -1.8    0.779 g/cm2 DualFemur Total Mean  10/05/2022    45.8         -1.8    0.779 g/cm2  Risk Factors screening:  History of low trauma fractures: No Family history of osteoporosis: Yes Hip fracture in first-degree relatives: No Smoking history: No Excessive alcohol intake >2 drinks/day: No Excessive caffeine intake >2 drinks/day: No Glucocorticoid use >5mg  prednisone/day for >3 months: No Rheumatoid arthritis history: No Premature/Surgical Menopause:  Yes   Anti-epileptic drugs No  Celiac disease/signs of malabsorption No  Gastric bypass/gastrectomy No  PPI use No  TZD use No  Thyroid hormone suppressive therapy No  Physical Exam  BP 110/70 (BP Location: Left Arm, Patient Position: Sitting, Cuff Size: Small)   Pulse 71   Ht 5\' 6"  (1.676 m)   Wt 135 lb (61.2 kg)   SpO2 98%   BMI 21.79 kg/m  Constitutional: well developed, well nourished Head: normocephalic, atraumatic Eyes: sclera anicteric, no redness Neck: supple Lungs: normal respiratory effort Neurology: alert and oriented Skin: dry, no appreciable rashes Musculoskeletal: no appreciable defects Psychiatric: normal mood and  affect  Allergies Allergies  Allergen Reactions   Gramineae Pollens Other (See Comments)    "Runny nose" per patient   Cefepime  Rash    Current Medications Patient's Medications  New Prescriptions   No medications on file  Previous Medications   CALCIUM CARBONATE (OS-CAL) 600 MG TABS TABLET    Take by mouth.   CHOLECALCIFEROL (VITAMIN D3) 25 MCG (1000 UNIT) TABLET    Take 1,000 Units by mouth daily.   LORATADINE  (CLARITIN ) 5 MG CHEWABLE TABLET       MAGNESIUM (MAGTAB) 84 MG (7MEQ) TBCR SR TABLET    Take by mouth.   TAMOXIFEN  (NOLVADEX ) 20 MG TABLET    TAKE 1 TABLET(20 MG) BY MOUTH DAILY  Modified Medications   No medications on file  Discontinued Medications   ACETAMINOPHEN  (TYLENOL ) 500 MG TABLET    Take by mouth.     Past Medical History Past Medical History:  Diagnosis Date   Abnormal Pap smear 09/13/2000   Allergy 02/2020   Seasonal   Breast cancer (HCC)    Depression    history of depression- denies currently   Hypertension    Port-A-Cath in place 08/10/2021   Preterm labor     Past Surgical History Past Surgical History:  Procedure Laterality Date   BREAST BIOPSY Right 07/27/2019   BREAST RECONSTRUCTION Right 07/27/2022   Procedure: RIGHT NIPPLE REDUCTION;  Surgeon: Alger Infield, MD;  Location: [facility];  Service: Plastics;  Laterality: Right;   BREAST RECONSTRUCTION WITH PLACEMENT OF TISSUE EXPANDER AND ALLODERM Bilateral 12/07/2021   Procedure: BREAST RECONSTRUCTION WITH PLACEMENT OF TISSUE EXPANDER AND ALLODERM;  Surgeon: Alger Infield, MD;  Location: [facility];  Service: Plastics;  Laterality: Bilateral;   CESAREAN SECTION  11/14/2011   Procedure: CESAREAN SECTION;  Surgeon: Anselmo Kings, MD;  Location: [facility];  Service: Gynecology;  Laterality: N/A;  Primary cesarean section with delivery of baby boy at [phone]. Apgars 9/9.   CESAREAN SECTION N/A 03/22/2014   Procedure: Repeat CESAREAN SECTION;  Surgeon: Mckinley Spells, MD;  Location: [facility];  Service: Obstetrics;  Laterality: N/A;   COSMETIC SURGERY  11/22/2017   Abdominoplasty   FOOT SURGERY     FRACTURE SURGERY  08/1999   Repair of 5th metatarsal in left foot   HERNIA REPAIR  11/22/2017   NIPPLE SPARING MASTECTOMY Left 12/07/2021   Procedure: LEFT NIPPLE SPARING MASTECTOMY;  Surgeon: Enid Harry, MD;  Location: [facility];  Service: General;  Laterality: Left;   NIPPLE SPARING MASTECTOMY WITH SENTINEL LYMPH NODE BIOPSY Right 12/07/2021   Procedure: RIGHT NIPPLE SPARING MASTECTOMY WITH AXILLARY SENTINEL LYMPH NODE BIOPSY;  Surgeon: Enid Harry, MD;  Location:  [facility]ERY C[facility];  Service: General;  Laterality: Right;   PORTACATH PLACEMENT N/A 06/24/2021   Procedure: INSERTION PORT-A-CATH;  Surgeon: Oza Blumenthal,  MD;  Location: [facility];  Service: General;  Laterality: N/A;   REMOVAL OF BILATERAL TISSUE EXPANDERS WITH PLACEMENT OF BILATERAL BREAST IMPLANTS Bilateral 07/27/2022   Procedure: REMOVAL OF BILATERAL TISSUE EXPANDERS WITH PLACEMENT OF BILATERAL BREAST IMPLANTS;  Surgeon: Alger Infield, MD;  Location: [facility];  Service: Plastics;  Laterality: Bilateral;   TUBAL LIGATION  11/22/2017   Tubes were completely removed   WISDOM TOOTH EXTRACTION      Family History family history includes Arthritis in her mother; Birth defects in her son; Breast cancer in her maternal grandmother; Breast cancer (age of onset: 79) in her mother; Depression in her father; Heart disease in her maternal grandfather; Hyperlipidemia in her brother and father; Hypertension in her brother and father; Kidney disease in her father; Melanoma (age of onset: 25) in her father; Osteopenia in her mother; Other in her maternal grandfather and mother; Ovarian cancer in her paternal grandmother; Pulmonary fibrosis in her father; Stroke in her paternal grandfather; Ulcerative colitis in her mother.  Social  History Social History   Socioeconomic History   Marital status: Married    Spouse name: Not on file   Number of children: Not on file   Years of education: Not on file   Highest education level: Not on file  Occupational History   Not on file  Tobacco Use   Smoking status: Never   Smokeless tobacco: Never  Vaping Use   Vaping status: Never Used  Substance and Sexual Activity   Alcohol use: Not Currently    Alcohol/week: 7.0 standard drinks of alcohol    Types: 7 Standard drinks or equivalent per week    Comment: I stopped drinking alcohol after finding the lump in my brea   Drug use: No   Sexual activity: Yes    Birth control/protection: Surgical  Other Topics Concern   Not on file  Social History Narrative   Not on file   Social Drivers of Health   Financial Resource Strain: Low Risk  (12/01/2022)   Received from [facility], [facility]   Overall Financial Resource Strain (CARDIA)    Difficulty of Paying Living Expenses: Not hard at all  Food Insecurity: No Food Insecurity (12/01/2022)   Received from [facility], [facility]   Hunger Vital Sign    Worried About Running Out of Food in the Last Year: Never true    Ran Out of Food in the Last Year: Never true  Transportation Needs: No Transportation Needs (12/01/2022)   Received from [facility], [facility]   PRAPARE - Transportation    Lack of Transportation (Medical): No    Lack of Transportation (Non-Medical): No  Physical Activity: Sufficiently Active (12/01/2022)   Received from [facility], [facility]   Exercise Vital Sign    Days of Exercise per Week: 5 days    Minutes of Exercise per Session: 30 min  Stress: Stress Concern Present (12/01/2022)   Received from [facility], [facility] Occupational [facility] - Occupational Stress Questionnaire    Feeling of Stress : To some extent  Social Connections: Socially Integrated (12/01/2022)   Received from [facility],  [facility]   Social Network    How would you rate your social network (family, work, friends)?: Good participation with social networks  Intimate Partner Violence: Not At Risk (12/01/2022)   Received from [facility], [facility]   HITS    Over the last 12 months how often  did your partner physically hurt you?: Never    Over the last 12 months how often did your partner insult you or talk down to you?: Never    Over the last 12 months how often did your partner threaten you with physical harm?: Never    Over the last 12 months how often did your partner scream or curse at you?: Never    Laboratory Investigations No components found for: "CMP" No components found for: "BMP" No results found for: "GFR" Lab Results  Component Value Date   CREATININE 0.80 04/12/2023   No results found for: "CBC" No components found for: "LFT" No components found for: "VITD" No results found for: "PTH"  No results found for: "TSH"  No components found for: "RENAL FUNCTION" No components found for: "MAGNESIUM"  Parts of this note may have been dictated using voice recognition software. There may be variances in spelling and vocabulary which are unintentional. Not all errors are proofread. Please notify the [facility] if any discrepancies are noted or if the meaning of any statement is not clear.

## 2024-01-17 ENCOUNTER — Encounter: Admitting: Rehabilitation

## 2024-01-17 LAB — RENAL FUNCTION PANEL
Albumin: 4.3 g/dL (ref 3.6–5.1)
BUN: 11 mg/dL (ref 7–25)
CO2: 28 mmol/L (ref 20–32)
Calcium: 9.1 mg/dL (ref 8.6–10.2)
Chloride: 107 mmol/L (ref 98–110)
Creat: 0.87 mg/dL (ref 0.50–0.99)
Glucose, Bld: 92 mg/dL (ref 65–139)
Phosphorus: 3.3 mg/dL (ref 2.5–4.5)
Potassium: 4.1 mmol/L (ref 3.5–5.3)
Sodium: 141 mmol/L (ref 135–146)

## 2024-01-17 LAB — VITAMIN D 25 HYDROXY (VIT D DEFICIENCY, FRACTURES): Vit D, 25-Hydroxy: 39 ng/mL (ref 30–100)

## 2024-01-18 ENCOUNTER — Other Ambulatory Visit: Payer: Self-pay | Admitting: "Endocrinology

## 2024-01-18 MED ORDER — ALENDRONATE SODIUM 70 MG PO TABS: 70.0000 mg | ORAL_TABLET | ORAL | 11 refills | Status: DC

## 2024-01-18 NOTE — Progress Notes (Signed)
 Vitamin D and calcium within normal limits.  Start Fosamax 70 mg once a week, prescription sent. Instructions on Fosamax use and side effects - particularly esophageal adverse events - are carefully reviewed with her. This drug must be taken upon arising for the day on an empty stomach, with a large 6-8 ounce glass of water; she must remain NPO in the upright position for at least 30 minutes afterwards and until after the first food of the day. If esophageal irritation is noted, she will stop the drug and call my office.

## 2024-02-25 ENCOUNTER — Other Ambulatory Visit: Payer: Self-pay | Admitting: Hematology and Oncology

## 2024-04-04 ENCOUNTER — Other Ambulatory Visit: Payer: Self-pay | Admitting: "Endocrinology

## 2024-04-04 DIAGNOSIS — M816 Localized osteoporosis [Lequesne]: Secondary | ICD-10-CM

## 2024-04-06 ENCOUNTER — Other Ambulatory Visit

## 2024-04-06 LAB — RENAL FUNCTION PANEL
Albumin: 4.2 g/dL (ref 3.6–5.1)
BUN: 13 mg/dL (ref 7–25)
CO2: 26 mmol/L (ref 20–32)
Calcium: 9.3 mg/dL (ref 8.6–10.2)
Chloride: 105 mmol/L (ref 98–110)
Creat: 0.69 mg/dL (ref 0.50–0.99)
Glucose, Bld: 87 mg/dL (ref 65–99)
Phosphorus: 4 mg/dL (ref 2.5–4.5)
Potassium: 4.2 mmol/L (ref 3.5–5.3)
Sodium: 139 mmol/L (ref 135–146)

## 2024-04-06 LAB — VITAMIN D 25 HYDROXY (VIT D DEFICIENCY, FRACTURES): Vit D, 25-Hydroxy: 37 ng/mL (ref 30–100)

## 2024-04-11 ENCOUNTER — Inpatient Hospital Stay
Payer: No Typology Code available for payment source | Attending: Hematology and Oncology | Admitting: Hematology and Oncology

## 2024-04-11 VITALS — BP 113/72 | HR 80 | Temp 98.9°F | Resp 16 | Ht 66.0 in | Wt 139.8 lb

## 2024-04-11 DIAGNOSIS — Z923 Personal history of irradiation: Secondary | ICD-10-CM | POA: Diagnosis not present

## 2024-04-11 DIAGNOSIS — Z1721 Progesterone receptor positive status: Secondary | ICD-10-CM | POA: Diagnosis not present

## 2024-04-11 DIAGNOSIS — R232 Flushing: Secondary | ICD-10-CM | POA: Insufficient documentation

## 2024-04-11 DIAGNOSIS — Z9013 Acquired absence of bilateral breasts and nipples: Secondary | ICD-10-CM | POA: Insufficient documentation

## 2024-04-11 DIAGNOSIS — Z1732 Human epidermal growth factor receptor 2 negative status: Secondary | ICD-10-CM | POA: Diagnosis not present

## 2024-04-11 DIAGNOSIS — N951 Menopausal and female climacteric states: Secondary | ICD-10-CM | POA: Insufficient documentation

## 2024-04-11 DIAGNOSIS — Z9221 Personal history of antineoplastic chemotherapy: Secondary | ICD-10-CM | POA: Diagnosis not present

## 2024-04-11 DIAGNOSIS — Z79810 Long term (current) use of selective estrogen receptor modulators (SERMs): Secondary | ICD-10-CM | POA: Insufficient documentation

## 2024-04-11 DIAGNOSIS — Z170 Estrogen receptor positive status [ER+]: Secondary | ICD-10-CM | POA: Insufficient documentation

## 2024-04-11 DIAGNOSIS — M8580 Other specified disorders of bone density and structure, unspecified site: Secondary | ICD-10-CM | POA: Diagnosis not present

## 2024-04-11 DIAGNOSIS — M7918 Myalgia, other site: Secondary | ICD-10-CM | POA: Insufficient documentation

## 2024-04-11 DIAGNOSIS — C50411 Malignant neoplasm of upper-outer quadrant of right female breast: Secondary | ICD-10-CM | POA: Insufficient documentation

## 2024-04-11 DIAGNOSIS — Z7983 Long term (current) use of bisphosphonates: Secondary | ICD-10-CM | POA: Diagnosis not present

## 2024-04-11 DIAGNOSIS — R918 Other nonspecific abnormal finding of lung field: Secondary | ICD-10-CM | POA: Insufficient documentation

## 2024-04-11 NOTE — Assessment & Plan Note (Signed)
 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 Neo adj chemo with AC X 4 followed by Taxol  and Carboplatin  weekly X 12 2. bilateral mastectomies with reconstruction 12/07/2021: Pathologic complete response in the right breast 0/4 lymph nodes negative 3.  Adjuvant radiation completed 03/01/2022 4. Adj Anti estrogen therapy with tamoxifen  for 10 yrs (started 10 mg daily on 01/11/2022:  Switched to Anastrozole  10/05/22: Patient was in menopause by lab work, switched back to tamoxifen  ------------------------------------------------------------------------------------------------------------------- Tamoxifen  toxicities: Mild to moderate hot flashes      Lung nodules: CT chest 09/30/2022: No evidence of metastatic disease in the chest.  Previously described right middle lobe nodules are stable and considered benign.  Postradiation changes right lung    Back discomfort: Lumbar spine x-ray: 08/31/2023: Negative, SI jt pain, improved from before.   Bone density 09/20/2023: T-score -2.7 (was -2.9): Osteoporosis: Recommend calcium vitamin D  and weightbearing exercises. Discussed bisphosphonate therapy.   Breast cancer surveillance: Breast exam 04/11/2024: Benign No role of imaging since she had bilateral mastectomies. Signatera testing: Negative

## 2024-04-11 NOTE — Progress Notes (Signed)
 Patient Care Team: Ebony Lenis, MD as PCP - General (Family Medicine) Ebony Domino, MD as Attending Physician (Radiation Oncology) Ebony Muskrat, MD (Inactive) as Referring Physician (Obstetrics and Gynecology) Ebony Potts, MD as Consulting Physician (Hematology and Oncology) Ebony Cough, MD as Consulting Physician (General Surgery) Ebony Filippo, MD as Consulting Physician (Plastic Surgery)  DIAGNOSIS:  Encounter Diagnosis  Name Primary?   Malignant neoplasm of upper-outer quadrant of right breast in female, estrogen receptor positive (HCC) Yes    SUMMARY OF ONCOLOGIC HISTORY: Oncology History  Malignant neoplasm of upper-outer quadrant of right breast in female, estrogen receptor positive (HCC)  06/04/2021 Initial Diagnosis   T2 N0 IDC, grade 3, ER 15% weak to moderate staining, PR 15% weak to moderate staining (functionally triple negative with HER2 not amplified), with a Ki-67 of 70% (breast MRI 3.1 cm mass)   06/10/2021 Cancer Staging   Staging form: Breast, AJCC 8th Edition - Clinical stage from 06/10/2021: Stage IIA (cT2, cN0, cM0, G3, ER+, PR+, HER2-) - Signed by Layla Sandria BROCKS, MD on 06/10/2021 Stage prefix: Initial diagnosis Histologic grading system: 3 grade system Laterality: Right Staged by: Pathologist and managing physician Stage used in treatment planning: Yes National guidelines used in treatment planning: Yes Type of national guideline used in treatment planning: NCCN   06/16/2021 Genetic Testing   [facility] CancerNext-Expanded was negative. No pathogenic variants were identified. 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 Report date is 06/19/2021.  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 Report date is May 2023.  The CancerNext-Expanded gene panel offered by [facility] and includes sequencing, rearrangement, and RNA analysis for the following 77 genes: AIP, ALK, APC, ATM, AXIN2, BAP1, BARD1,  BLM, BMPR1A, BRCA1, BRCA2, BRIP1, CDC73, CDH1, CDK4, CDKN1B, CDKN2A, CHEK2, CTNNA1, DICER1, FANCC, FH, FLCN, GALNT12, KIF1B, LZTR1, MAX, MEN1, MET, MLH1, MSH2, MSH3, MSH6, MUTYH, NBN, NF1, NF2, NTHL1, PALB2, PHOX2B, PMS2, POT1, PRKAR1A, PTCH1, PTEN, RAD51C, RAD51D, RB1, RECQL, RET, SDHA, SDHAF2, SDHB, SDHC, SDHD, SMAD4, SMARCA4, SMARCB1, SMARCE1, STK11, SUFU, TMEM127, TP53, TSC1, TSC2, VHL and XRCC2 (sequencing and deletion/duplication); EGFR, EGLN1, HOXB13, KIT, MITF, PDGFRA, POLD1, and POLE (sequencing only); EPCAM and GREM1 (deletion/duplication only).    06/25/2021 -  Neo-Adjuvant Chemotherapy   neoadjuvant chemotherapy consisting of doxorubicin  and cyclophosphamide  in dose dense fashion x4 started 0/13/2022, completed 08/10/2021, to be followed by weekly carboplatin  and paclitaxel  x12 starting 08/24/2021   12/07/2021 Surgery   Left mastectomy: Benign Right mastectomy: Negative for residual cancer, 0/4 lymph nodes negative pathologic complete response,   01/11/2022 -  Anti-estrogen oral therapy   Tamoxifen  x 10 years   01/20/2022 - 03/01/2022 Radiation Therapy   Site Technique Total Dose (Gy) Dose per Fx (Gy) Completed Fx Beam Energies  Chest Wall, Right: CW_R 3D 50.4/50.4 1.8 28/28 6XFFF       CHIEF COMPLIANT: Follow-up on tamoxifen  therapy  HISTORY OF PRESENT ILLNESS:   History of Present Illness A 48 year old female with breast cancer on tamoxifen  who presents for follow-up regarding menopausal symptoms and bone health.  She experiences hot flashes, weight gain, and sleep disturbances, which she attributes to tamoxifen  or menopause. The frequency of hot flashes has slightly improved since her last gynecologist visit. Anastrozole  was considered but not initiated due to concerns about bone density.  For bone health, she has been on Fosamax  for three months, taking it once a week without issues.  She notes tightness and occasional pain in the area where she received radiation  therapy,  particularly under her arm. Physical therapy for cording and stiffness included massage and stretching exercises, though she admits to inconsistent home practice.  She participates in regular Signatera blood tests, which have been negative. Initially conducted every three months, the frequency has been reduced to every six months due to the burden of frequent testing.     ALLERGIES:  is allergic to gramineae pollens and cefepime .  MEDICATIONS:  Current Outpatient Medications  Medication Sig Dispense Refill   alendronate  (FOSAMAX ) 70 MG tablet Take 1 tablet (70 mg total) by mouth every 7 (seven) days. Take with a full glass of water on an empty stomach. 4 tablet 11   Biotin 2.5 MG TABS Take by mouth.     calcium carbonate (OS-CAL) 600 MG TABS tablet Take by mouth.     cholecalciferol (VITAMIN D3) 25 MCG (1000 UNIT) tablet Take 1,000 Units by mouth daily.     loratadine  (CLARITIN ) 5 MG chewable tablet  (Patient taking differently: Chew 10 mg by mouth.)     magnesium (MAGTAB) 84 MG ( ) TBCR SR tablet Take by mouth.     tamoxifen  (NOLVADEX ) 20 MG tablet TAKE 1 TABLET(20 MG) BY MOUTH DAILY 90 tablet 3   No current facility-administered medications for this visit.    PHYSICAL EXAMINATION: ECOG PERFORMANCE STATUS: 1 - Symptomatic but completely ambulatory  Vitals:   04/11/24 0922  BP: 113/72  Pulse: 80  Resp: 16  Temp: 98.9 F (37.2 C)  SpO2: 99%   Filed Weights   04/11/24 0922  Weight: 139 lb 12.8 oz (63.4 kg)    Physical Exam GENERAL: Normal exam findings.  (exam performed in the presence of a chaperone)  LABORATORY DATA:  I have reviewed the data as listed    Latest Ref Rng & Units 04/06/2024   11:45 AM 01/16/2024    3:12 PM 04/12/2023    9:57 AM  CMP  Glucose 65 - 99 mg/dL 87  92  85   BUN 7 - 25 mg/dL 13  11  18    Creatinine 0.50 - 0.99 mg/dL 9.30  9.12  9.19   Sodium 135 - 146 mmol/L 139  141  141   Potassium 3.5 - 5.3 mmol/L 4.2  4.1  4.1   Chloride 98 - 110  mmol/L 105  107  109   CO2 20 - 32 mmol/L 26  28  27    Calcium 8.6 - 10.2 mg/dL 9.3  9.1  9.2   Total Protein 6.5 - 8.1 g/dL   6.7   Total Bilirubin 0.3 - 1.2 mg/dL   0.2   Alkaline Phos 38 - 126 U/L   56   AST 15 - 41 U/L   15   ALT 0 - 44 U/L   9     Lab Results  Component Value Date   WBC 5.7 04/12/2023   HGB 12.8 04/12/2023   HCT 42.0 04/12/2023   MCV 109.1 (H) 04/12/2023   PLT 157 04/12/2023   NEUTROABS 2.6 04/12/2023    ASSESSMENT & PLAN:  Malignant neoplasm of upper-outer quadrant of right breast in female, estrogen receptor positive (HCC) 06/04/2021:T2 N0 IDC, grade 3, ER 15% weak to moderate staining, PR 15% weak to moderate staining (functionally triple negative with HER2 not amplified), with a Ki-67 of 70% (breast MRI 3.1 cm mass)   Treatment Plan: 1. Neo adj chemo with AC X 4 followed by Taxol  and Carboplatin  weekly X 12 2. bilateral mastectomies with reconstruction 12/07/2021: Pathologic complete  response in the right breast 0/4 lymph nodes negative 3.  Adjuvant radiation completed 03/01/2022 4. Adj Anti estrogen therapy with tamoxifen  for 10 yrs (started 10 mg daily on 01/11/2022:  Switched to Anastrozole  10/05/22: Patient was in menopause by lab work, switched back to tamoxifen  ------------------------------------------------------------------------------------------------------------------- Tamoxifen  toxicities: Mild to moderate hot flashes      Lung nodules: CT chest 09/30/2022: No evidence of metastatic disease in the chest.  Previously described right middle lobe nodules are stable and considered benign.  Postradiation changes right lung    Back discomfort: Lumbar spine x-ray: 08/31/2023: Negative, SI jt pain, improved from before.   Bone density 09/20/2023: T-score -2.7 (was -2.9): Osteoporosis: Recommend calcium vitamin D  and weightbearing exercises.  Currently on Fosamax    Breast cancer surveillance: Breast exam 04/11/2024: Benign No role of imaging since she had  bilateral mastectomies. Signatera testing: Negative ------------------------------------- Assessment and Plan Assessment & Plan Estrogen receptor positive breast cancer, status post right upper-outer quadrant resection, on tamoxifen , surveillance with Signatera Currently on tamoxifen  and Signatera surveillance. Experiences tightness and occasional pain in the right chest and axilla, likely from prior radiation. Signatera tests negative. - Continue tamoxifen  therapy. - Continue Signatera surveillance every six months. - Consider imaging if unusual upper back or abdominal pain lasts more than a week.  Menopausal symptoms (hot flashes, sleep disturbance, weight gain) associated with tamoxifen  therapy Experiences menopausal symptoms possibly due to tamoxifen  or menopause. Discussed non-hormonal options with gynecologist, including gabapentin  for hot flashes and sleep. - Prescribe low-dose gabapentin  at bedtime to manage hot flashes and improve sleep.  Osteopenia on bisphosphonate therapy On Fosamax  for osteopenia for three months without issues. Tamoxifen  may improve bone density. - Continue Fosamax  therapy. - Monitor bone density; consider discontinuing Fosamax  if bone density improves significantly.  Post-radiation musculoskeletal pain and tightness, right chest and axilla Reports tightness and pain in right chest and axilla from prior radiation. Inconsistent with home exercises after physical therapy. - Encourage regular stretching and exercises to alleviate stiffness and pain in the right chest and axilla.      No orders of the defined types were placed in this encounter.  The patient has a good understanding of the overall plan. she agrees with it. she will call with any problems that may develop before the next visit here. Total time spent: 30 mins including face to face time and time spent for planning, charting and co-ordination of care   Viinay K Rachelle Edwards, MD 04/11/24

## 2024-04-12 ENCOUNTER — Encounter: Payer: Self-pay | Admitting: "Endocrinology

## 2024-04-12 ENCOUNTER — Ambulatory Visit: Admitting: "Endocrinology

## 2024-04-12 VITALS — BP 102/80 | HR 72 | Ht 66.0 in | Wt 138.0 lb

## 2024-04-12 DIAGNOSIS — M816 Localized osteoporosis [Lequesne]: Secondary | ICD-10-CM

## 2024-04-12 MED ORDER — ALENDRONATE SODIUM 70 MG PO TABS: 70.0000 mg | ORAL_TABLET | ORAL | 3 refills | Status: AC

## 2024-04-12 NOTE — Progress Notes (Signed)
 [facility] Note [name], MD    Referring Provider: Seabron Lenis, MD Primary Care Provider: Seabron Lenis, MD No chief complaint on file.  Assessment & Plan  Diagnoses and all orders for this visit:  Localized osteoporosis without current pathological fracture -     VITAMIN D  25 Hydroxy (Vit-D Deficiency, Fractures) -     Renal function panel  Other orders -     alendronate  (FOSAMAX ) 70 MG tablet; Take 1 tablet (70 mg total) by mouth every 7 (seven) days. Take with a full glass of water on an empty stomach.  Osteoporosis, diagnosed in 09/2023 with  L1-L4 T-score of -2.7, likely secondary cause from chemotherapy.   On fosamax  70 mg weekly. Continue current treatment.  Recommend to use calcium 600 mg twice daily and vitamin D  2000 units OTC supplements.   Continue weight bearing exercise options and dietary supplements. Patient is reserved and would like not to be treated, discussed pros and cons of treatment at length  Educated on risks and side effects of fosamax  including but not limited to esophagitis, worsening GERD, atypical femoral fractures and osteonecrosis of the jaw. Advised to take medication first thing in the morning with plenty of water and stay upright for 30 minutes after taking the medication. Previously discussed other antiresorptive/anabolic medications, including Reclast and Prolia.  Follow fall precautions, adequate dairy in diet and exercises (aerobic, balancing and weight bearing) as tolerated.   Return in about 1 year (around 04/12/2025).  I have reviewed current medications, nurse's notes, allergies, vital signs, past medical and surgical history, family medical history, and social history for this encounter. Counseled patient on symptoms, examination findings, lab findings, imaging results, treatment decisions and monitoring and prognosis. The patient understood the recommendations and agrees with the treatment plan. All questions regarding  treatment plan were fully answered.   [name], MD   04/12/24   History of Present Illness Ebony Lamb is a 48 y.o. year old female who presents to our clinic with osteoporosis diagnosed in 09/2023 with  L1-L4 T score of  -2.7. She is currently taking Calcium and vitamin D , dose unknown.  Never been treated for osteoporosis.  No falls/fractures Had toe injury, no x-ray done  Taking calcium 600 mg twice daily and vitamin D  2000 units OTC supplements ____________  Got menopause at 46 after chemotherapy for breast cancer. Was put on tamoxifen    09/2023 DEXA scan, reviewed images and report Site Region Measured Date Measured Age YA BMD Significant CHANGE T-score AP Spine  L1-L4       09/20/2023    46.8         -2.7    0.866 g/cm2 AP Spine  L1-L4       10/05/2022    45.8         -2.9    0.842 g/cm2   DualFemur Total Right 09/20/2023    46.8         -1.9    0.774 g/cm2 DualFemur Total Right 10/05/2022    45.8         -1.9    0.770 g/cm2   DualFemur Total Mean  09/20/2023    46.8         -1.8    0.779 g/cm2 DualFemur Total Mean  10/05/2022    45.8         -1.8    0.779 g/cm2  Risk Factors screening:  History of low trauma fractures: No Family history of osteoporosis: Yes  Hip fracture in first-degree relatives: No Smoking history: No Excessive alcohol intake >2 drinks/day: No Excessive caffeine intake >2 drinks/day: No Glucocorticoid use >5mg  prednisone/day for >3 months: No Rheumatoid arthritis history: No Premature/Surgical Menopause: Yes   Anti-epileptic drugs No  Celiac disease/signs of malabsorption No  Gastric bypass/gastrectomy No  PPI use No  TZD use No  Thyroid hormone suppressive therapy No  Physical Exam  BP 102/80   Pulse 72   Ht 5' 6 (1.676 m)   Wt 138 lb (62.6 kg)   SpO2 96%   BMI 22.27 kg/m  Constitutional: well developed, well nourished Head: normocephalic, atraumatic Eyes: sclera anicteric, no redness Neck: supple Lungs: normal respiratory  effort Neurology: alert and oriented Skin: dry, no appreciable rashes Musculoskeletal: no appreciable defects Psychiatric: normal mood and affect  Allergies Allergies  Allergen Reactions   Gramineae Pollens Other (See Comments)    Runny nose per patient   Cefepime  Rash    Current Medications Patient's Medications  New Prescriptions   No medications on file  Previous Medications   BIOTIN 2.5 MG TABS    Take by mouth.   CALCIUM CARBONATE (OS-CAL) 600 MG TABS TABLET    Take by mouth.   CHOLECALCIFEROL (VITAMIN D3) 25 MCG (1000 UNIT) TABLET    Take 1,000 Units by mouth daily.   LORATADINE  (CLARITIN ) 5 MG CHEWABLE TABLET       MAGNESIUM (MAGTAB) 84 MG ( ) TBCR SR TABLET    Take by mouth.   TAMOXIFEN  (NOLVADEX ) 20 MG TABLET    TAKE 1 TABLET(20 MG) BY MOUTH DAILY  Modified Medications   Modified Medication Previous Medication   ALENDRONATE  (FOSAMAX ) 70 MG TABLET alendronate  (FOSAMAX ) 70 MG tablet      Take 1 tablet (70 mg total) by mouth every 7 (seven) days. Take with a full glass of water on an empty stomach.    Take 1 tablet (70 mg total) by mouth every 7 (seven) days. Take with a full glass of water on an empty stomach.  Discontinued Medications   No medications on file     Past Medical History Past Medical History:  Diagnosis Date   Abnormal Pap smear 09/13/2000   Allergy 02/2020   Seasonal   Breast cancer (HCC)    Depression    history of depression- denies currently   Hypertension    Port-A-Cath in place 08/10/2021   Preterm labor     Past Surgical History Past Surgical History:  Procedure Laterality Date   BREAST BIOPSY Right 07/27/2019   BREAST RECONSTRUCTION Right 07/27/2022   Procedure: RIGHT NIPPLE REDUCTION;  Surgeon: Arelia Filippo, MD;  Location: [facility];  Service: Plastics;  Laterality: Right;   BREAST RECONSTRUCTION WITH PLACEMENT OF TISSUE EXPANDER AND ALLODERM Bilateral 12/07/2021   Procedure: BREAST RECONSTRUCTION WITH  PLACEMENT OF TISSUE EXPANDER AND ALLODERM;  Surgeon: Arelia Filippo, MD;  Location: [facility];  Service: Plastics;  Laterality: Bilateral;   CESAREAN SECTION  11/14/2011   Procedure: CESAREAN SECTION;  Surgeon: Rome LULLA Rigg, MD;  Location: [facility];  Service: Gynecology;  Laterality: N/A;  Primary cesarean section with delivery of baby boy at [phone]. Apgars 9/9.   CESAREAN SECTION N/A 03/22/2014   Procedure: Repeat CESAREAN SECTION;  Surgeon: Nena DELENA App, MD;  Location: [facility];  Service: Obstetrics;  Laterality: N/A;   COSMETIC SURGERY  11/22/2017   Abdominoplasty   FOOT SURGERY     FRACTURE SURGERY  08/1999   Repair of 5th metatarsal in  left foot   HERNIA REPAIR  11/22/2017   NIPPLE SPARING MASTECTOMY Left 12/07/2021   Procedure: LEFT NIPPLE SPARING MASTECTOMY;  Surgeon: Ebbie Cough, MD;  Location: [facility];  Service: General;  Laterality: Left;   NIPPLE SPARING MASTECTOMY WITH SENTINEL LYMPH NODE BIOPSY Right 12/07/2021   Procedure: RIGHT NIPPLE SPARING MASTECTOMY WITH AXILLARY SENTINEL LYMPH NODE BIOPSY;  Surgeon: Ebbie Cough, MD;  Location: [facility];  Service: General;  Laterality: Right;   PORTACATH PLACEMENT N/A 06/24/2021   Procedure: INSERTION PORT-A-CATH;  Surgeon: Vernetta Berg, MD;  Location: [facility];  Service: General;  Laterality: N/A;   REMOVAL OF BILATERAL TISSUE EXPANDERS WITH PLACEMENT OF BILATERAL BREAST IMPLANTS Bilateral 07/27/2022   Procedure: REMOVAL OF BILATERAL TISSUE EXPANDERS WITH PLACEMENT OF BILATERAL BREAST IMPLANTS;  Surgeon: Arelia Filippo, MD;  Location: [facility];  Service: Plastics;  Laterality: Bilateral;   TUBAL LIGATION  11/22/2017   Tubes were completely removed   WISDOM TOOTH EXTRACTION      Family History family history includes Arthritis in her mother; Birth defects in her son; Breast cancer in her maternal grandmother; Breast cancer (age of  onset: 23) in her mother; Depression in her father; Heart disease in her maternal grandfather; Hyperlipidemia in her brother and father; Hypertension in her brother and father; Kidney disease in her father; Melanoma (age of onset: 90) in her father; Osteopenia in her mother; Other in her maternal grandfather and mother; Ovarian cancer in her paternal grandmother; Pulmonary fibrosis in her father; Stroke in her paternal grandfather; Ulcerative colitis in her mother.  Social History Social History   Socioeconomic History   Marital status: Married    Spouse name: Not on file   Number of children: Not on file   Years of education: Not on file   Highest education level: Not on file  Occupational History   Not on file  Tobacco Use   Smoking status: Never   Smokeless tobacco: Never  Vaping Use   Vaping status: Never Used  Substance and Sexual Activity   Alcohol use: Not Currently    Alcohol/week: 7.0 standard drinks of alcohol    Types: 7 Standard drinks or equivalent per week    Comment: I stopped drinking alcohol after finding the lump in my brea   Drug use: No   Sexual activity: Yes    Birth control/protection: Surgical  Other Topics Concern   Not on file  Social History Narrative   Not on file   Social Drivers of Health   Financial Resource Strain: Low Risk  (12/01/2022)   Received from [facility]   Overall Financial Resource Strain (CARDIA)    Difficulty of Paying Living Expenses: Not hard at all  Food Insecurity: No Food Insecurity (12/01/2022)   Received from [facility]   Hunger Vital Sign    Within the past 12 months, you worried that your food would run out before you got the money to buy more.: Never true    Within the past 12 months, the food you bought just didn't last and you didn't have money to get more.: Never true  Transportation Needs: No Transportation Needs (12/01/2022)   Received from [facility] - Transportation    Lack of Transportation  (Medical): No    Lack of Transportation (Non-Medical): No  Physical Activity: Sufficiently Active (12/01/2022)   Received from [facility]   Exercise Vital Sign    On average, how many days per  week do you engage in moderate to strenuous exercise (like a brisk walk)?: 5 days    On average, how many minutes do you engage in exercise at this level?: 30 min  Stress: Stress Concern Present (12/01/2022)   Received from [facility] - Occupational Stress Questionnaire    Feeling of Stress : To some extent  Social Connections: Socially Integrated (12/01/2022)   Received from [facility]   Social Network    How would you rate your social network (family, work, friends)?: Good participation with social networks  Intimate Partner Violence: Not At Risk (12/01/2022)   Received from [facility]   HITS    Over the last 12 months how often did your partner physically hurt you?: Never    Over the last 12 months how often did your partner insult you or talk down to you?: Never    Over the last 12 months how often did your partner threaten you with physical harm?: Never    Over the last 12 months how often did your partner scream or curse at you?: Never    Laboratory Investigations No components found for: CMP No components found for: BMP No results found for: GFR Lab Results  Component Value Date   CREATININE 0.69 04/06/2024   No results found for: CBC No components found for: LFT No components found for: VITD No results found for: PTH  No results found for: TSH  No components found for: RENAL FUNCTION No components found for: MAGNESIUM  Parts of this note may have been dictated using voice recognition software. There may be variances in spelling and vocabulary which are unintentional. Not all errors are proofread. Please notify the dino if any discrepancies are noted or if the meaning of any statement is not clear.

## 2024-05-07 ENCOUNTER — Ambulatory Visit: Payer: Self-pay | Attending: Surgery

## 2024-05-07 VITALS — Wt 140.0 lb

## 2024-05-07 DIAGNOSIS — Z483 Aftercare following surgery for neoplasm: Secondary | ICD-10-CM | POA: Insufficient documentation

## 2024-05-07 NOTE — Therapy (Signed)
 OUTPATIENT PHYSICAL THERAPY SOZO SCREENING NOTE   Patient Name: Ebony Lamb MRN: [id number] DOB:Oct 12, 1975, 48 y.o., female Today's Date: 05/07/2024  PCP: Seabron Lenis, MD REFERRING PROVIDER: Vernetta Berg, MD   PT End of Session - 05/07/24 [phone]     Visit Number 1   # unchanged due to screen only   PT Start Time 0941    PT Stop Time 0945    PT Time Calculation (min) 4 min    Activity Tolerance Patient tolerated treatment well    Behavior During Therapy [facility] for tasks assessed/performed          Past Medical History:  Diagnosis Date   Abnormal Pap smear 09/13/2000   Allergy 02/2020   Seasonal   Breast cancer (HCC)    Depression    history of depression- denies currently   Hypertension    Port-A-Cath in place 08/10/2021   Preterm labor    Past Surgical History:  Procedure Laterality Date   BREAST BIOPSY Right 07/27/2019   BREAST RECONSTRUCTION Right 07/27/2022   Procedure: RIGHT NIPPLE REDUCTION;  Surgeon: Arelia Filippo, MD;  Location: [facility];  Service: Plastics;  Laterality: Right;   BREAST RECONSTRUCTION WITH PLACEMENT OF TISSUE EXPANDER AND ALLODERM Bilateral 12/07/2021   Procedure: BREAST RECONSTRUCTION WITH PLACEMENT OF TISSUE EXPANDER AND ALLODERM;  Surgeon: Arelia Filippo, MD;  Location: [facility];  Service: Plastics;  Laterality: Bilateral;   CESAREAN SECTION  11/14/2011   Procedure: CESAREAN SECTION;  Surgeon: Rome LULLA Rigg, MD;  Location: [facility];  Service: Gynecology;  Laterality: N/A;  Primary cesarean section with delivery of baby boy at [phone]. Apgars 9/9.   CESAREAN SECTION N/A 03/22/2014   Procedure: Repeat CESAREAN SECTION;  Surgeon: Nena DELENA App, MD;  Location: [facility];  Service: Obstetrics;  Laterality: N/A;   COSMETIC SURGERY  11/22/2017   Abdominoplasty   FOOT SURGERY     FRACTURE SURGERY  08/1999   Repair of 5th metatarsal in left foot   HERNIA REPAIR  11/22/2017   NIPPLE SPARING MASTECTOMY Left  12/07/2021   Procedure: LEFT NIPPLE SPARING MASTECTOMY;  Surgeon: Ebbie Cough, MD;  Location: [facility];  Service: General;  Laterality: Left;   NIPPLE SPARING MASTECTOMY WITH SENTINEL LYMPH NODE BIOPSY Right 12/07/2021   Procedure: RIGHT NIPPLE SPARING MASTECTOMY WITH AXILLARY SENTINEL LYMPH NODE BIOPSY;  Surgeon: Ebbie Cough, MD;  Location: [facility];  Service: General;  Laterality: Right;   PORTACATH PLACEMENT N/A 06/24/2021   Procedure: INSERTION PORT-A-CATH;  Surgeon: Vernetta Berg, MD;  Location: [facility];  Service: General;  Laterality: N/A;   REMOVAL OF BILATERAL TISSUE EXPANDERS WITH PLACEMENT OF BILATERAL BREAST IMPLANTS Bilateral 07/27/2022   Procedure: REMOVAL OF BILATERAL TISSUE EXPANDERS WITH PLACEMENT OF BILATERAL BREAST IMPLANTS;  Surgeon: Arelia Filippo, MD;  Location: [facility];  Service: Plastics;  Laterality: Bilateral;   TUBAL LIGATION  11/22/2017   Tubes were completely removed   WISDOM TOOTH EXTRACTION     Patient Active Problem List   Diagnosis Date Noted   Palpitations 05/11/2023   Precordial pain 05/11/2023   Osteoporosis 10/11/2022   Vitamin D  deficiency 10/11/2022   Genetic testing 06/17/2021   Family history of breast cancer 06/10/2021   Family history of ovarian cancer 06/10/2021   Family history of BRCA gene mutation 06/10/2021   Malignant neoplasm of upper-outer quadrant of right breast in female, estrogen receptor positive (HCC) 06/08/2021   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 She underwent neoadjuvant chemotherapy and then had bilateral mastectomies with a right sentinel node biopsy (4 negative nodes) on 12/07/2021. Now has implant reconstruction. 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  Completed radiation on the Rt side over expander.   PRECAUTIONS: right UE Lymphedema risk, None  SUBJECTIVE: Pt returns for her first 6 month L-Dex screen.   PAIN:  Are you having pain? No  SOZO SCREENING: Patient was assessed today using the SOZO machine to determine the lymphedema index score. This was compared to her baseline score. It was determined that she is within the recommended range when compared to her baseline and no further action is needed at this time. She will continue SOZO screenings. These are done every 3 months for 2 years post operatively followed by every 6 months for 2 years, and then annually.  L-DEX FLOWSHEETS - 05/07/24 0900       L-DEX LYMPHEDEMA SCREENING   Measurement Type Unilateral    L-DEX MEASUREMENT EXTREMITY Upper Extremity    POSITION  Standing    DOMINANT SIDE Right    At Risk Side Right    BASELINE SCORE (UNILATERAL) -0.7    L-DEX SCORE (UNILATERAL) -2    VALUE CHANGE (UNILAT) -1.3            Aden Berwyn Caldron, PTA 05/07/2024, 9:45 AM

## 2024-06-30 LAB — SIGNATERA ONLY (NATERA MANAGED)
SIGNATERA MTM READOUT: 0 MTM/ml
SIGNATERA TEST RESULT: NEGATIVE

## 2024-07-16 ENCOUNTER — Encounter: Payer: Self-pay | Admitting: Radiology

## 2024-09-17 ENCOUNTER — Emergency Department (HOSPITAL_COMMUNITY)
Admission: EM | Admit: 2024-09-17 | Discharge: 2024-09-17 | Disposition: A | Discharge status: Home / Self Care | Attending: Emergency Medicine | Admitting: Emergency Medicine

## 2024-09-17 DIAGNOSIS — J111 Influenza due to unidentified influenza virus with other respiratory manifestations: Secondary | ICD-10-CM

## 2024-09-17 DIAGNOSIS — J101 Influenza due to other identified influenza virus with other respiratory manifestations: Secondary | ICD-10-CM | POA: Insufficient documentation

## 2024-09-17 DIAGNOSIS — R509 Fever, unspecified: Secondary | ICD-10-CM | POA: Diagnosis present

## 2024-09-17 LAB — RESP PANEL BY RT-PCR (RSV, FLU A&B, COVID)  RVPGX2
Influenza A by PCR: NEGATIVE
Influenza B by PCR: POSITIVE — AB
Resp Syncytial Virus by PCR: NEGATIVE
SARS Coronavirus 2 by RT PCR: NEGATIVE

## 2024-09-17 NOTE — Discharge Instructions (Signed)
 As we discussed, you can use over-the-counter medication such as Delsym for cough suppression, and use Mucinex as needed to help with congestion.  Would also recommend that you alter your antihistamine from loratadine  or Claritin  to cetirizine or Zyrtec.  The clear drainage from your nose is assuredly thin mucus that is draining from your sinuses, and should help resolve with time as this infection clears.  He can use fluticasone  or Flonase  nasal spray to help with nasal swelling, and to help relieve congestion.  Follow-up with your primary provider for further assessment and further concerns.

## 2024-09-17 NOTE — ED Provider Notes (Signed)
 " [place] EMERGENCY DEPARTMENT AT [facility] Provider Note   CSN: [id number] Arrival date & time: 09/17/24  1142     Patient presents with: Nasal Congestion and Fever   CARMIN ALVIDREZ is a 49 y.o. female has concerns for nasal congestion, clear rhinorrhea, and fever.  She has a son that was recently diagnosed with influenza B and about 1 to 2 weeks ago, now has the above-mentioned symptoms.  She does voice concerns of a clear rhinorrhea stating that she is concerned this may be cerebrospinal fluid.  There is no noted history of facial trauma, and no other history of CSF leaks or other concerning facial surgeries.    Fever Associated symptoms: congestion, cough and sore throat        Prior to Admission medications  Medication Sig Start Date End Date Taking? Authorizing Provider  alendronate  (FOSAMAX ) 70 MG tablet Take 1 tablet (70 mg total) by mouth every 7 (seven) days. Take with a full glass of water on an empty stomach. 04/12/24   Motwani, Komal, MD  Biotin 2.5 MG TABS Take by mouth.    [provider]  calcium carbonate (OS-CAL) 600 MG TABS tablet Take by mouth.    [provider]  cholecalciferol (VITAMIN D3) 25 MCG (1000 UNIT) tablet Take 1,000 Units by mouth daily.    [provider]  loratadine  (CLARITIN ) 5 MG chewable tablet     [provider]  magnesium (MAGTAB) 84 MG ( ) TBCR SR tablet Take by mouth.    [provider]  tamoxifen  (NOLVADEX ) 20 MG tablet TAKE 1 TABLET(20 MG) BY MOUTH DAILY 02/27/24   Odean Potts, MD    Allergies: Gramineae pollens and Cefepime     Review of Systems  Constitutional:  Positive for fever.  HENT:  Positive for congestion and sore throat.   Respiratory:  Positive for cough.   All other systems reviewed and are negative.   Updated Vital Signs BP (!) 132/100 (BP Location: Left Arm)   Pulse 98   Temp 98.4 F (36.9 C) (Oral)   Resp 18   Ht 5' 6 (1.676 m)   Wt 63.5 kg   SpO2  100%   BMI 22.60 kg/m   Physical Exam Vitals and nursing note reviewed.  Constitutional:      General: She is not in acute distress.    Appearance: Normal appearance.  HENT:     Head: Normocephalic and atraumatic.     Right Ear: Hearing, tympanic membrane, ear canal and external ear normal.     Left Ear: Hearing, tympanic membrane, ear canal and external ear normal.     Nose: Septal deviation, congestion and rhinorrhea present. Rhinorrhea is clear.     Comments: There is noted anterior septal deviation to the right, with notable mucosal edema however no paleness or bogginess of the mucosa.  Left nare completely patent, right nare does seem to be obstructed due to mucosal edema and septal deviation.    Mouth/Throat:     Mouth: Mucous membranes are moist.     Pharynx: Oropharynx is clear.  Eyes:     Extraocular Movements: Extraocular movements intact.     Conjunctiva/sclera: Conjunctivae normal.     Pupils: Pupils are equal, round, and reactive to light.  Cardiovascular:     Rate and Rhythm: Normal rate and regular rhythm.     Pulses: Normal pulses.     Heart sounds: Normal heart sounds. No murmur heard.    No friction rub.  No gallop.  Pulmonary:     Effort: Pulmonary effort is normal.     Breath sounds: Normal breath sounds.  Abdominal:     General: Abdomen is flat. Bowel sounds are normal.     Palpations: Abdomen is soft.  Musculoskeletal:        General: Normal range of motion.     Cervical back: Normal range of motion and neck supple.     Right lower leg: No edema.     Left lower leg: No edema.  Skin:    General: Skin is warm and dry.     Capillary Refill: Capillary refill takes less than 2 seconds.  Neurological:     General: No focal deficit present.     Mental Status: She is alert. Mental status is at baseline.  Psychiatric:        Mood and Affect: Mood normal.     (all labs ordered are listed, but only abnormal results are displayed) Labs Reviewed  RESP PANEL  BY RT-PCR (RSV, FLU A&B, COVID)  [id number]    EKG: None  Radiology: No results found.   Procedures   Medications Ordered in the ED - No data to display                                  Medical Decision Making  Based on the physical exam and the history of the present illness, believe this is likely secondary to influenza that she acquired from family however can also be other respiratory viruses.  Given her concerns for CSF leak, she does not have any history of facial trauma or any other facial surgeries that would be risk factors for developing CSF leak, therefore counseled patient that this is less likely and to follow-up with her primary care provider given this.  Given the fever, clear rhinorrhea, and other flulike symptoms, believe this is likely influenza however respiratory panel swab is pending at time of discharge, patient has been advised to follow-up in MyChart for results of this.  At this encouraged increase fluid intake, increased rest, over-the-counter medications for cough and cold, and follow-up with PCP for further concerns which she voices understanding and agreement.     Final diagnoses:  Influenza-like illness    ED Discharge Orders     None          Myriam Dorn BROCKS, [place] 09/17/24 1224    Tegeler, Lonni PARAS, MD 09/17/24 1451  "

## 2024-09-17 NOTE — ED Triage Notes (Signed)
 Patient reports son sick with flu, now c/o fever/nasal congestion. Patient is alert and oriented x 4. Airway patent, respirations even and unlabored. Skin normal, warm and dry. Claritin  taken this AM.

## 2024-11-05 ENCOUNTER — Ambulatory Visit: Attending: Surgery

## 2025-04-11 ENCOUNTER — Ambulatory Visit: Admitting: Hematology and Oncology

## 2025-04-12 ENCOUNTER — Ambulatory Visit: Admitting: "Endocrinology

## 2025-04-15 ENCOUNTER — Ambulatory Visit: Admitting: "Endocrinology
